# Patient Record
Sex: Male | Born: 1955 | Race: White | Hispanic: No | Marital: Married | Smoking: Former smoker
Health system: Southern US, Community
[De-identification: ages and names within clinical notes are randomized; demographics above are authoritative.]

## PROBLEM LIST (undated history)

## (undated) DIAGNOSIS — J45909 Unspecified asthma, uncomplicated: Secondary | ICD-10-CM

## (undated) DIAGNOSIS — K2210 Ulcer of esophagus without bleeding: Secondary | ICD-10-CM

## (undated) DIAGNOSIS — Z8719 Personal history of other diseases of the digestive system: Secondary | ICD-10-CM

## (undated) DIAGNOSIS — K219 Gastro-esophageal reflux disease without esophagitis: Secondary | ICD-10-CM

## (undated) DIAGNOSIS — G43909 Migraine, unspecified, not intractable, without status migrainosus: Secondary | ICD-10-CM

## (undated) DIAGNOSIS — J449 Chronic obstructive pulmonary disease, unspecified: Secondary | ICD-10-CM

## (undated) DIAGNOSIS — I82409 Acute embolism and thrombosis of unspecified deep veins of unspecified lower extremity: Secondary | ICD-10-CM

## (undated) DIAGNOSIS — N400 Enlarged prostate without lower urinary tract symptoms: Secondary | ICD-10-CM

## (undated) DIAGNOSIS — K22711 Barrett's esophagus with high grade dysplasia: Secondary | ICD-10-CM

## (undated) DIAGNOSIS — C8310 Mantle cell lymphoma, unspecified site: Secondary | ICD-10-CM

## (undated) DIAGNOSIS — J342 Deviated nasal septum: Secondary | ICD-10-CM

## (undated) HISTORY — PX: THROAT SURGERY: SHX803

## (undated) HISTORY — PX: NASAL SEPTUM SURGERY: SHX37

## (undated) HISTORY — PX: OTHER SURGICAL HISTORY: SHX169

## (undated) HISTORY — PX: TONSILLECTOMY: SUR1361

---

## 2003-03-16 ENCOUNTER — Encounter: Payer: Self-pay | Admitting: Internal Medicine

## 2003-03-16 ENCOUNTER — Ambulatory Visit (HOSPITAL_COMMUNITY): Admission: RE | Admit: 2003-03-16 | Discharge: 2003-03-16 | Payer: Self-pay | Admitting: Internal Medicine

## 2007-02-03 ENCOUNTER — Ambulatory Visit: Payer: Self-pay | Admitting: Nurse Practitioner

## 2007-02-03 ENCOUNTER — Ambulatory Visit: Payer: Self-pay | Admitting: *Deleted

## 2007-02-11 ENCOUNTER — Ambulatory Visit: Payer: Self-pay | Admitting: Family Medicine

## 2007-04-09 ENCOUNTER — Ambulatory Visit: Payer: Self-pay | Admitting: Internal Medicine

## 2007-04-09 ENCOUNTER — Encounter (INDEPENDENT_AMBULATORY_CARE_PROVIDER_SITE_OTHER): Payer: Self-pay | Admitting: Nurse Practitioner

## 2007-04-09 LAB — CONVERTED CEMR LAB
HDL: 44 mg/dL (ref >39)
LDL Cholesterol: 100 mg/dL — ABNORMAL HIGH (ref 0–99)
Triglycerides: 228 mg/dL — ABNORMAL HIGH (ref <150)

## 2007-05-18 DIAGNOSIS — J309 Allergic rhinitis, unspecified: Secondary | ICD-10-CM | POA: Insufficient documentation

## 2007-05-18 DIAGNOSIS — J45909 Unspecified asthma, uncomplicated: Secondary | ICD-10-CM | POA: Insufficient documentation

## 2007-05-18 DIAGNOSIS — J342 Deviated nasal septum: Secondary | ICD-10-CM

## 2014-02-08 LAB — COMPREHENSIVE METABOLIC PANEL
ALBUMIN: 4.1 g/dL (ref 3.4–5.0)
ALK PHOS: 99 U/L
AST: 27 U/L (ref 15–37)
Anion Gap: 9 (ref 7–16)
BILIRUBIN TOTAL: 0.5 mg/dL (ref 0.2–1.0)
BUN: 10 mg/dL (ref 7–18)
Calcium, Total: 9.1 mg/dL (ref 8.5–10.1)
Chloride: 104 mmol/L (ref 98–107)
Co2: 27 mmol/L (ref 21–32)
Creatinine: 0.81 mg/dL (ref 0.60–1.30)
EGFR (African American): >60
Glucose: 92 mg/dL (ref 65–99)
OSMOLALITY: 278 (ref 275–301)
POTASSIUM: 3.2 mmol/L — AB (ref 3.5–5.1)
SGPT (ALT): 25 U/L (ref 12–78)
SODIUM: 140 mmol/L (ref 136–145)
Total Protein: 7.1 g/dL (ref 6.4–8.2)

## 2014-02-08 LAB — CBC WITH DIFFERENTIAL/PLATELET
Bands: 7 %
Comment - H1-Com3: NORMAL
EOS PCT: 9 %
HCT: 41 % (ref 40.0–52.0)
HGB: 14.5 g/dL (ref 13.0–18.0)
LYMPHS PCT: 10 %
MCH: 33.1 pg (ref 26.0–34.0)
MCHC: 35.3 g/dL (ref 32.0–36.0)
MCV: 94 fL (ref 80–100)
METAMYELOCYTE: 1 %
Monocytes: 18 %
NRBC/100 WBC: 1 /
Platelet: 177 10*3/uL (ref 150–440)
RBC: 4.37 10*6/uL — AB (ref 4.40–5.90)
RDW: 14 % (ref 11.5–14.5)
Segmented Neutrophils: 54 %
Variant Lymphocyte - H1-Rlymph: 1 %
WBC: 8.4 10*3/uL (ref 3.8–10.6)

## 2014-02-08 LAB — URINALYSIS, COMPLETE
Bacteria: NONE SEEN
Bilirubin,UR: NEGATIVE
Blood: NEGATIVE
Glucose,UR: NEGATIVE mg/dL (ref 0–75)
Ketone: NEGATIVE
Leukocyte Esterase: NEGATIVE
Nitrite: NEGATIVE
Ph: 5 (ref 4.5–8.0)
Protein: 30
RBC,UR: 2 /HPF (ref 0–5)
SQUAMOUS EPITHELIAL: NONE SEEN
Specific Gravity: 1.036 (ref 1.003–1.030)
WBC UR: 12 /HPF (ref 0–5)

## 2014-02-08 LAB — D-DIMER(ARMC): D-DIMER: 1026 ng/mL

## 2014-02-08 LAB — LIPASE, BLOOD: LIPASE: 94 U/L (ref 73–393)

## 2014-02-08 LAB — MAGNESIUM: MAGNESIUM: 2 mg/dL

## 2014-02-09 LAB — POTASSIUM: Potassium: 3.4 mmol/L — ABNORMAL LOW (ref 3.5–5.1)

## 2014-02-10 ENCOUNTER — Inpatient Hospital Stay: Payer: Self-pay | Admitting: Internal Medicine

## 2014-02-11 ENCOUNTER — Ambulatory Visit (HOSPITAL_COMMUNITY)
Admission: AD | Admit: 2014-02-11 | Discharge: 2014-02-11 | Disposition: A | Discharge status: Home / Self Care | Payer: Medicaid Other | Source: Other Acute Inpatient Hospital | Attending: Internal Medicine | Admitting: Internal Medicine

## 2014-02-11 DIAGNOSIS — R112 Nausea with vomiting, unspecified: Secondary | ICD-10-CM | POA: Diagnosis present

## 2014-02-11 DIAGNOSIS — C8589 Other specified types of non-Hodgkin lymphoma, extranodal and solid organ sites: Secondary | ICD-10-CM | POA: Insufficient documentation

## 2014-04-29 LAB — CBC
HCT: 37.4 % — AB (ref 40.0–52.0)
HGB: 12.8 g/dL — ABNORMAL LOW (ref 13.0–18.0)
MCH: 31.3 pg (ref 26.0–34.0)
MCHC: 34.1 g/dL (ref 32.0–36.0)
MCV: 92 fL (ref 80–100)
PLATELETS: 320 10*3/uL (ref 150–440)
RBC: 4.08 10*6/uL — ABNORMAL LOW (ref 4.40–5.90)
RDW: 14 % (ref 11.5–14.5)
WBC: 5.3 10*3/uL (ref 3.8–10.6)

## 2014-04-29 LAB — BASIC METABOLIC PANEL
ANION GAP: 9 (ref 7–16)
BUN: 12 mg/dL (ref 7–18)
CHLORIDE: 108 mmol/L — AB (ref 98–107)
Calcium, Total: 8.1 mg/dL — ABNORMAL LOW (ref 8.5–10.1)
Co2: 24 mmol/L (ref 21–32)
Creatinine: 0.88 mg/dL (ref 0.60–1.30)
GLUCOSE: 77 mg/dL (ref 65–99)
Osmolality: 280 (ref 275–301)
Potassium: 3.2 mmol/L — ABNORMAL LOW (ref 3.5–5.1)
SODIUM: 141 mmol/L (ref 136–145)

## 2014-04-29 LAB — TROPONIN I: Troponin-I: <0.02 ng/mL

## 2014-04-30 ENCOUNTER — Inpatient Hospital Stay: Payer: Self-pay | Admitting: Internal Medicine

## 2014-05-01 LAB — CBC WITH DIFFERENTIAL/PLATELET
Basophil #: 0.1 10*3/uL (ref 0.0–0.1)
Basophil %: 1.5 %
EOS PCT: 8 %
Eosinophil #: 0.3 10*3/uL (ref 0.0–0.7)
HCT: 41.3 % (ref 40.0–52.0)
HGB: 14.2 g/dL (ref 13.0–18.0)
Lymphocyte #: 0.7 10*3/uL — ABNORMAL LOW (ref 1.0–3.6)
Lymphocyte %: 19.2 %
MCH: 31.2 pg (ref 26.0–34.0)
MCHC: 34.4 g/dL (ref 32.0–36.0)
MCV: 91 fL (ref 80–100)
MONO ABS: 0.8 x10 3/mm (ref 0.2–1.0)
MONOS PCT: 23 %
NEUTROS ABS: 1.7 10*3/uL (ref 1.4–6.5)
NEUTROS PCT: 48.3 %
Platelet: 299 10*3/uL (ref 150–440)
RBC: 4.56 10*6/uL (ref 4.40–5.90)
RDW: 14 % (ref 11.5–14.5)
WBC: 3.6 10*3/uL — ABNORMAL LOW (ref 3.8–10.6)

## 2014-05-01 LAB — MAGNESIUM: Magnesium: 2 mg/dL

## 2014-05-01 LAB — BASIC METABOLIC PANEL
ANION GAP: 5 — AB (ref 7–16)
BUN: 13 mg/dL (ref 7–18)
CHLORIDE: 103 mmol/L (ref 98–107)
CO2: 30 mmol/L (ref 21–32)
CREATININE: 0.94 mg/dL (ref 0.60–1.30)
Calcium, Total: 8.8 mg/dL (ref 8.5–10.1)
Glucose: 120 mg/dL — ABNORMAL HIGH (ref 65–99)
Osmolality: 277 (ref 275–301)
Potassium: 3.8 mmol/L (ref 3.5–5.1)
SODIUM: 138 mmol/L (ref 136–145)

## 2014-05-02 LAB — CBC WITH DIFFERENTIAL/PLATELET
Basophil #: 0 10*3/uL (ref 0.0–0.1)
Basophil %: 1 %
EOS ABS: 0.2 10*3/uL (ref 0.0–0.7)
Eosinophil %: 6.8 %
HCT: 38.9 % — ABNORMAL LOW (ref 40.0–52.0)
HGB: 13.6 g/dL (ref 13.0–18.0)
Lymphocyte #: 0.9 10*3/uL — ABNORMAL LOW (ref 1.0–3.6)
Lymphocyte %: 25.4 %
MCH: 31.3 pg (ref 26.0–34.0)
MCHC: 35 g/dL (ref 32.0–36.0)
MCV: 89 fL (ref 80–100)
MONO ABS: 0.9 x10 3/mm (ref 0.2–1.0)
MONOS PCT: 26 %
Neutrophil #: 1.4 10*3/uL (ref 1.4–6.5)
Neutrophil %: 40.8 %
PLATELETS: 271 10*3/uL (ref 150–440)
RBC: 4.35 10*6/uL — ABNORMAL LOW (ref 4.40–5.90)
RDW: 13.9 % (ref 11.5–14.5)
WBC: 3.5 10*3/uL — AB (ref 3.8–10.6)

## 2014-05-02 LAB — VANCOMYCIN, TROUGH: VANCOMYCIN, TROUGH: 14 ug/mL (ref 10–20)

## 2014-05-05 LAB — CULTURE, BLOOD (SINGLE)

## 2014-05-31 ENCOUNTER — Inpatient Hospital Stay: Payer: Self-pay | Admitting: Internal Medicine

## 2014-05-31 LAB — COMPREHENSIVE METABOLIC PANEL
ALBUMIN: 4.4 g/dL (ref 3.4–5.0)
Alkaline Phosphatase: 98 U/L
Anion Gap: 10 (ref 7–16)
BILIRUBIN TOTAL: 0.6 mg/dL (ref 0.2–1.0)
BUN: 11 mg/dL (ref 7–18)
CHLORIDE: 101 mmol/L (ref 98–107)
CO2: 29 mmol/L (ref 21–32)
Calcium, Total: 9 mg/dL (ref 8.5–10.1)
Creatinine: 0.84 mg/dL (ref 0.60–1.30)
EGFR (African American): >60
EGFR (Non-African Amer.): >60
Glucose: 80 mg/dL (ref 65–99)
OSMOLALITY: 278 (ref 275–301)
Potassium: 3.3 mmol/L — ABNORMAL LOW (ref 3.5–5.1)
SGOT(AST): 24 U/L (ref 15–37)
SGPT (ALT): 25 U/L
Sodium: 140 mmol/L (ref 136–145)
TOTAL PROTEIN: 7.6 g/dL (ref 6.4–8.2)

## 2014-05-31 LAB — URINALYSIS, COMPLETE
BILIRUBIN, UR: NEGATIVE
Bacteria: NONE SEEN
Blood: NEGATIVE
Glucose,UR: NEGATIVE mg/dL (ref 0–75)
Ketone: NEGATIVE
LEUKOCYTE ESTERASE: NEGATIVE
NITRITE: NEGATIVE
PH: 5 (ref 4.5–8.0)
Protein: NEGATIVE
RBC,UR: 1 /HPF (ref 0–5)
Specific Gravity: 1.016 (ref 1.003–1.030)
Squamous Epithelial: NONE SEEN
WBC UR: 2 /HPF (ref 0–5)

## 2014-05-31 LAB — CBC WITH DIFFERENTIAL/PLATELET
Basophil #: 0 10*3/uL (ref 0.0–0.1)
Basophil %: 0.5 %
EOS ABS: 0.1 10*3/uL (ref 0.0–0.7)
EOS PCT: 0.9 %
HCT: 44 % (ref 40.0–52.0)
HGB: 15 g/dL (ref 13.0–18.0)
LYMPHS ABS: 1.2 10*3/uL (ref 1.0–3.6)
Lymphocyte %: 13.3 %
MCH: 30.6 pg (ref 26.0–34.0)
MCHC: 34.1 g/dL (ref 32.0–36.0)
MCV: 90 fL (ref 80–100)
MONO ABS: 0.8 x10 3/mm (ref 0.2–1.0)
MONOS PCT: 9.2 %
Neutrophil #: 6.9 10*3/uL — ABNORMAL HIGH (ref 1.4–6.5)
Neutrophil %: 76.1 %
Platelet: 304 10*3/uL (ref 150–440)
RBC: 4.9 10*6/uL (ref 4.40–5.90)
RDW: 14.7 % — AB (ref 11.5–14.5)
WBC: 9 10*3/uL (ref 3.8–10.6)

## 2014-05-31 LAB — TROPONIN I: Troponin-I: <0.02 ng/mL

## 2014-06-01 LAB — CBC WITH DIFFERENTIAL/PLATELET
BASOS PCT: 0.7 %
Basophil #: 0 10*3/uL (ref 0.0–0.1)
EOS PCT: 3.8 %
Eosinophil #: 0.2 10*3/uL (ref 0.0–0.7)
HCT: 36.8 % — AB (ref 40.0–52.0)
HGB: 12.3 g/dL — AB (ref 13.0–18.0)
Lymphocyte #: 0.9 10*3/uL — ABNORMAL LOW (ref 1.0–3.6)
Lymphocyte %: 16.4 %
MCH: 30.3 pg (ref 26.0–34.0)
MCHC: 33.4 g/dL (ref 32.0–36.0)
MCV: 91 fL (ref 80–100)
MONO ABS: 0.6 x10 3/mm (ref 0.2–1.0)
Monocyte %: 10.7 %
NEUTROS PCT: 68.4 %
Neutrophil #: 3.6 10*3/uL (ref 1.4–6.5)
Platelet: 215 10*3/uL (ref 150–440)
RBC: 4.06 10*6/uL — AB (ref 4.40–5.90)
RDW: 14.6 % — AB (ref 11.5–14.5)
WBC: 5.3 10*3/uL (ref 3.8–10.6)

## 2014-06-01 LAB — BASIC METABOLIC PANEL
Anion Gap: 6 — ABNORMAL LOW (ref 7–16)
BUN: 9 mg/dL (ref 7–18)
CALCIUM: 7.9 mg/dL — AB (ref 8.5–10.1)
Chloride: 108 mmol/L — ABNORMAL HIGH (ref 98–107)
Co2: 30 mmol/L (ref 21–32)
Creatinine: 0.83 mg/dL (ref 0.60–1.30)
EGFR (African American): >60
EGFR (Non-African Amer.): >60
GLUCOSE: 83 mg/dL (ref 65–99)
OSMOLALITY: 285 (ref 275–301)
POTASSIUM: 3.6 mmol/L (ref 3.5–5.1)
Sodium: 144 mmol/L (ref 136–145)

## 2014-06-01 LAB — CLOSTRIDIUM DIFFICILE(ARMC)

## 2014-06-02 LAB — WBCS, STOOL

## 2014-06-03 LAB — STOOL CULTURE

## 2014-06-03 LAB — PATHOLOGY REPORT

## 2014-06-07 LAB — PATHOLOGY REPORT

## 2014-06-20 ENCOUNTER — Ambulatory Visit: Payer: Self-pay | Admitting: Gastroenterology

## 2014-07-11 ENCOUNTER — Emergency Department: Payer: Self-pay | Admitting: Emergency Medicine

## 2014-07-11 LAB — CBC WITH DIFFERENTIAL/PLATELET
BASOS ABS: 0 10*3/uL (ref 0.0–0.1)
BASOS PCT: 0.8 %
EOS PCT: 5.8 %
Eosinophil #: 0.2 10*3/uL (ref 0.0–0.7)
HCT: 38.7 % — ABNORMAL LOW (ref 40.0–52.0)
HGB: 13.2 g/dL (ref 13.0–18.0)
LYMPHS ABS: 0.5 10*3/uL — AB (ref 1.0–3.6)
Lymphocyte %: 16.8 %
MCH: 31.4 pg (ref 26.0–34.0)
MCHC: 34 g/dL (ref 32.0–36.0)
MCV: 93 fL (ref 80–100)
Monocyte #: 0.7 x10 3/mm (ref 0.2–1.0)
Monocyte %: 23.2 %
Neutrophil #: 1.6 10*3/uL (ref 1.4–6.5)
Neutrophil %: 53.4 %
Platelet: 187 10*3/uL (ref 150–440)
RBC: 4.19 10*6/uL — ABNORMAL LOW (ref 4.40–5.90)
RDW: 13.2 % (ref 11.5–14.5)
WBC: 3.1 10*3/uL — ABNORMAL LOW (ref 3.8–10.6)

## 2014-07-11 LAB — COMPREHENSIVE METABOLIC PANEL
ALBUMIN: 3.6 g/dL (ref 3.4–5.0)
ALK PHOS: 98 U/L
ALT: 34 U/L
ANION GAP: 6 — AB (ref 7–16)
BUN: 14 mg/dL (ref 7–18)
Bilirubin,Total: 0.6 mg/dL (ref 0.2–1.0)
CHLORIDE: 105 mmol/L (ref 98–107)
CO2: 31 mmol/L (ref 21–32)
Calcium, Total: 8.4 mg/dL — ABNORMAL LOW (ref 8.5–10.1)
Creatinine: 0.84 mg/dL (ref 0.60–1.30)
EGFR (Non-African Amer.): >60
GLUCOSE: 123 mg/dL — AB (ref 65–99)
Osmolality: 285 (ref 275–301)
Potassium: 3.6 mmol/L (ref 3.5–5.1)
SGOT(AST): 28 U/L (ref 15–37)
Sodium: 142 mmol/L (ref 136–145)
Total Protein: 6.8 g/dL (ref 6.4–8.2)

## 2014-07-21 ENCOUNTER — Ambulatory Visit: Payer: Self-pay | Admitting: Gastroenterology

## 2014-08-19 ENCOUNTER — Emergency Department: Payer: Self-pay | Admitting: Emergency Medicine

## 2014-08-19 LAB — CBC WITH DIFFERENTIAL/PLATELET
BASOS ABS: 0.1 10*3/uL (ref 0.0–0.1)
Basophil %: 0.7 %
Eosinophil #: 0.1 10*3/uL (ref 0.0–0.7)
Eosinophil %: 1.1 %
HCT: 43.6 % (ref 40.0–52.0)
HGB: 14.8 g/dL (ref 13.0–18.0)
LYMPHS ABS: 0.8 10*3/uL — AB (ref 1.0–3.6)
Lymphocyte %: 10.7 %
MCH: 31 pg (ref 26.0–34.0)
MCHC: 34 g/dL (ref 32.0–36.0)
MCV: 91 fL (ref 80–100)
MONO ABS: 0.7 x10 3/mm (ref 0.2–1.0)
MONOS PCT: 9.4 %
Neutrophil #: 5.8 10*3/uL (ref 1.4–6.5)
Neutrophil %: 78.1 %
PLATELETS: 216 10*3/uL (ref 150–440)
RBC: 4.78 10*6/uL (ref 4.40–5.90)
RDW: 13.8 % (ref 11.5–14.5)
WBC: 7.4 10*3/uL (ref 3.8–10.6)

## 2014-08-19 LAB — COMPREHENSIVE METABOLIC PANEL
ALBUMIN: 3.8 g/dL (ref 3.4–5.0)
ALK PHOS: 95 U/L
AST: 32 U/L (ref 15–37)
Anion Gap: 10 (ref 7–16)
BILIRUBIN TOTAL: 0.7 mg/dL (ref 0.2–1.0)
BUN: 12 mg/dL (ref 7–18)
Calcium, Total: 8.7 mg/dL (ref 8.5–10.1)
Chloride: 105 mmol/L (ref 98–107)
Co2: 25 mmol/L (ref 21–32)
Creatinine: 1.17 mg/dL (ref 0.60–1.30)
EGFR (African American): >60
EGFR (Non-African Amer.): >60
GLUCOSE: 122 mg/dL — AB (ref 65–99)
OSMOLALITY: 280 (ref 275–301)
POTASSIUM: 3.8 mmol/L (ref 3.5–5.1)
SGPT (ALT): 24 U/L
Sodium: 140 mmol/L (ref 136–145)
TOTAL PROTEIN: 7 g/dL (ref 6.4–8.2)

## 2014-08-19 LAB — LIPASE, BLOOD: LIPASE: 48 U/L — AB (ref 73–393)

## 2014-08-20 ENCOUNTER — Emergency Department: Payer: Self-pay | Admitting: Emergency Medicine

## 2014-08-20 LAB — COMPREHENSIVE METABOLIC PANEL
ANION GAP: 8 (ref 7–16)
Albumin: 4 g/dL (ref 3.4–5.0)
Alkaline Phosphatase: 101 U/L
BUN: 13 mg/dL (ref 7–18)
Bilirubin,Total: 0.8 mg/dL (ref 0.2–1.0)
CO2: 26 mmol/L (ref 21–32)
Calcium, Total: 8.9 mg/dL (ref 8.5–10.1)
Chloride: 105 mmol/L (ref 98–107)
Creatinine: 0.98 mg/dL (ref 0.60–1.30)
EGFR (African American): >60
Glucose: 99 mg/dL (ref 65–99)
OSMOLALITY: 278 (ref 275–301)
POTASSIUM: 3.7 mmol/L (ref 3.5–5.1)
SGOT(AST): 24 U/L (ref 15–37)
SGPT (ALT): 21 U/L
SODIUM: 139 mmol/L (ref 136–145)
Total Protein: 6.9 g/dL (ref 6.4–8.2)

## 2014-08-20 LAB — CBC
HCT: 43.2 % (ref 40.0–52.0)
HGB: 14.9 g/dL (ref 13.0–18.0)
MCH: 31.3 pg (ref 26.0–34.0)
MCHC: 34.5 g/dL (ref 32.0–36.0)
MCV: 91 fL (ref 80–100)
Platelet: 189 10*3/uL (ref 150–440)
RBC: 4.77 10*6/uL (ref 4.40–5.90)
RDW: 13.7 % (ref 11.5–14.5)
WBC: 7.2 10*3/uL (ref 3.8–10.6)

## 2014-08-20 LAB — URINALYSIS, COMPLETE
BILIRUBIN, UR: NEGATIVE
BLOOD: NEGATIVE
Bacteria: NONE SEEN
Glucose,UR: NEGATIVE mg/dL (ref 0–75)
Ketone: NEGATIVE
Leukocyte Esterase: NEGATIVE
Nitrite: NEGATIVE
PH: 5 (ref 4.5–8.0)
Protein: 30
RBC,UR: 3 /HPF (ref 0–5)
Specific Gravity: 1.02 (ref 1.003–1.030)
Squamous Epithelial: NONE SEEN
WBC UR: 9 /HPF (ref 0–5)

## 2014-08-20 LAB — LIPASE, BLOOD: LIPASE: 49 U/L — AB (ref 73–393)

## 2014-08-20 LAB — TROPONIN I: Troponin-I: <0.02 ng/mL

## 2014-08-29 LAB — URINALYSIS, COMPLETE
Bacteria: NONE SEEN
Bilirubin,UR: NEGATIVE
Blood: NEGATIVE
Glucose,UR: NEGATIVE mg/dL (ref 0–75)
Hyaline Cast: 5
Ketone: NEGATIVE
Leukocyte Esterase: NEGATIVE
NITRITE: NEGATIVE
Ph: 5 (ref 4.5–8.0)
Protein: NEGATIVE
RBC,UR: <1 /HPF (ref 0–5)
Specific Gravity: 1.02 (ref 1.003–1.030)
Squamous Epithelial: NONE SEEN
WBC UR: 1 /HPF (ref 0–5)

## 2014-08-29 LAB — TROPONIN I: Troponin-I: <0.02 ng/mL

## 2014-08-29 LAB — COMPREHENSIVE METABOLIC PANEL
ALK PHOS: 99 U/L
AST: 29 U/L (ref 15–37)
Albumin: 3.8 g/dL (ref 3.4–5.0)
Anion Gap: 6 — ABNORMAL LOW (ref 7–16)
BILIRUBIN TOTAL: 0.4 mg/dL (ref 0.2–1.0)
BUN: 10 mg/dL (ref 7–18)
CREATININE: 0.96 mg/dL (ref 0.60–1.30)
Calcium, Total: 8.7 mg/dL (ref 8.5–10.1)
Chloride: 106 mmol/L (ref 98–107)
Co2: 28 mmol/L (ref 21–32)
EGFR (African American): >60
EGFR (Non-African Amer.): >60
Glucose: 100 mg/dL — ABNORMAL HIGH (ref 65–99)
Osmolality: 279 (ref 275–301)
Potassium: 4.1 mmol/L (ref 3.5–5.1)
SGPT (ALT): 24 U/L
Sodium: 140 mmol/L (ref 136–145)
Total Protein: 6.8 g/dL (ref 6.4–8.2)

## 2014-08-29 LAB — CBC
HCT: 38.3 % — AB (ref 40.0–52.0)
HGB: 12.9 g/dL — ABNORMAL LOW (ref 13.0–18.0)
MCH: 31 pg (ref 26.0–34.0)
MCHC: 33.6 g/dL (ref 32.0–36.0)
MCV: 93 fL (ref 80–100)
PLATELETS: 164 10*3/uL (ref 150–440)
RBC: 4.14 10*6/uL — AB (ref 4.40–5.90)
RDW: 14.2 % (ref 11.5–14.5)
WBC: 4.1 10*3/uL (ref 3.8–10.6)

## 2014-08-30 ENCOUNTER — Observation Stay: Payer: Self-pay | Admitting: Internal Medicine

## 2014-09-29 ENCOUNTER — Ambulatory Visit: Payer: Self-pay | Admitting: Gastroenterology

## 2014-11-05 ENCOUNTER — Emergency Department: Payer: Self-pay | Admitting: Emergency Medicine

## 2014-11-26 ENCOUNTER — Emergency Department
Admit: 2014-11-26 | Disposition: A | Discharge status: Home / Self Care | Payer: Self-pay | Admitting: Emergency Medicine

## 2014-11-26 LAB — COMPREHENSIVE METABOLIC PANEL
ALBUMIN: 4.4 g/dL
AST: 21 U/L
Alkaline Phosphatase: 86 U/L
Anion Gap: 10 (ref 7–16)
BILIRUBIN TOTAL: 1 mg/dL
BUN: 17 mg/dL
CREATININE: 0.77 mg/dL
Calcium, Total: 8.7 mg/dL — ABNORMAL LOW
Chloride: 102 mmol/L
Co2: 26 mmol/L
EGFR (Non-African Amer.): >60
Glucose: 100 mg/dL — ABNORMAL HIGH
POTASSIUM: 3.4 mmol/L — AB
SGPT (ALT): 16 U/L — ABNORMAL LOW
SODIUM: 138 mmol/L
Total Protein: 7.3 g/dL

## 2014-11-26 LAB — CBC WITH DIFFERENTIAL/PLATELET
BASOS PCT: 0.7 %
Basophil #: 0 10*3/uL (ref 0.0–0.1)
EOS ABS: 0.2 10*3/uL (ref 0.0–0.7)
EOS PCT: 3.7 %
HCT: 44.2 % (ref 40.0–52.0)
HGB: 15.5 g/dL (ref 13.0–18.0)
Lymphocyte #: 1 10*3/uL (ref 1.0–3.6)
Lymphocyte %: 17.9 %
MCH: 31.6 pg (ref 26.0–34.0)
MCHC: 35.2 g/dL (ref 32.0–36.0)
MCV: 90 fL (ref 80–100)
Monocyte #: 0.6 x10 3/mm (ref 0.2–1.0)
Monocyte %: 10.9 %
Neutrophil #: 3.8 10*3/uL (ref 1.4–6.5)
Neutrophil %: 66.8 %
Platelet: 191 10*3/uL (ref 150–440)
RBC: 4.91 10*6/uL (ref 4.40–5.90)
RDW: 14.4 % (ref 11.5–14.5)
WBC: 5.8 10*3/uL (ref 3.8–10.6)

## 2014-11-26 LAB — URINALYSIS, COMPLETE
BLOOD: NEGATIVE
Bacteria: NONE SEEN
Bilirubin,UR: NEGATIVE
GLUCOSE, UR: NEGATIVE mg/dL (ref 0–75)
Ketone: NEGATIVE
LEUKOCYTE ESTERASE: NEGATIVE
NITRITE: NEGATIVE
PH: 6 (ref 4.5–8.0)
Protein: NEGATIVE
Specific Gravity: 1.029 (ref 1.003–1.030)
Squamous Epithelial: NONE SEEN

## 2014-11-26 LAB — D-DIMER(ARMC): D-Dimer: 436 ng/ml

## 2014-11-26 LAB — LIPASE, BLOOD: Lipase: 45 U/L

## 2014-11-26 LAB — TROPONIN I: Troponin-I: <0.03 ng/mL

## 2014-11-27 LAB — URINALYSIS, COMPLETE
BACTERIA: NONE SEEN
BILIRUBIN, UR: NEGATIVE
BLOOD: NEGATIVE
GLUCOSE, UR: NEGATIVE mg/dL (ref 0–75)
Ketone: NEGATIVE
Leukocyte Esterase: NEGATIVE
Nitrite: NEGATIVE
Ph: 7 (ref 4.5–8.0)
Protein: NEGATIVE
SQUAMOUS EPITHELIAL: NONE SEEN
Specific Gravity: 1.017 (ref 1.003–1.030)

## 2014-11-27 LAB — CBC WITH DIFFERENTIAL/PLATELET
BASOS PCT: 0.8 %
Basophil #: 0.1 10*3/uL (ref 0.0–0.1)
EOS PCT: 3.4 %
Eosinophil #: 0.2 10*3/uL (ref 0.0–0.7)
HCT: 43.6 % (ref 40.0–52.0)
HGB: 15 g/dL (ref 13.0–18.0)
Lymphocyte #: 1.3 10*3/uL (ref 1.0–3.6)
Lymphocyte %: 18.6 %
MCH: 31 pg (ref 26.0–34.0)
MCHC: 34.3 g/dL (ref 32.0–36.0)
MCV: 90 fL (ref 80–100)
MONO ABS: 0.6 x10 3/mm (ref 0.2–1.0)
Monocyte %: 9 %
Neutrophil #: 4.7 10*3/uL (ref 1.4–6.5)
Neutrophil %: 68.2 %
Platelet: 200 10*3/uL (ref 150–440)
RBC: 4.83 10*6/uL (ref 4.40–5.90)
RDW: 14.3 % (ref 11.5–14.5)
WBC: 6.9 10*3/uL (ref 3.8–10.6)

## 2014-11-27 LAB — COMPREHENSIVE METABOLIC PANEL
ALBUMIN: 4.2 g/dL
ALT: 15 U/L — AB
ANION GAP: 8 (ref 7–16)
AST: 19 U/L
Alkaline Phosphatase: 77 U/L
BUN: 13 mg/dL
Bilirubin,Total: 0.6 mg/dL
CHLORIDE: 105 mmol/L
Calcium, Total: 8.6 mg/dL — ABNORMAL LOW
Co2: 25 mmol/L
Creatinine: 0.63 mg/dL
GLUCOSE: 95 mg/dL
POTASSIUM: 3.7 mmol/L
Sodium: 138 mmol/L
TOTAL PROTEIN: 6.8 g/dL

## 2014-11-27 LAB — TROPONIN I

## 2014-11-27 LAB — LIPASE, BLOOD: Lipase: 24 U/L

## 2014-11-28 ENCOUNTER — Observation Stay
Admit: 2014-11-28 | Disposition: A | Discharge status: Home / Self Care | Payer: Self-pay | Attending: Internal Medicine | Admitting: Internal Medicine

## 2014-11-28 LAB — CBC WITH DIFFERENTIAL/PLATELET
Basophil #: 0 10*3/uL (ref 0.0–0.1)
Basophil %: 0.8 %
EOS ABS: 0.3 10*3/uL (ref 0.0–0.7)
Eosinophil %: 5.8 %
HCT: 37 % — ABNORMAL LOW (ref 40.0–52.0)
HGB: 12.8 g/dL — AB (ref 13.0–18.0)
LYMPHS PCT: 19.8 %
Lymphocyte #: 0.9 10*3/uL — ABNORMAL LOW (ref 1.0–3.6)
MCH: 31 pg (ref 26.0–34.0)
MCHC: 34.5 g/dL (ref 32.0–36.0)
MCV: 90 fL (ref 80–100)
Monocyte #: 0.5 x10 3/mm (ref 0.2–1.0)
Monocyte %: 10.6 %
NEUTROS ABS: 2.7 10*3/uL (ref 1.4–6.5)
Neutrophil %: 63 %
PLATELETS: 166 10*3/uL (ref 150–440)
RBC: 4.12 10*6/uL — AB (ref 4.40–5.90)
RDW: 14.1 % (ref 11.5–14.5)
WBC: 4.4 10*3/uL (ref 3.8–10.6)

## 2014-11-28 LAB — BASIC METABOLIC PANEL
Anion Gap: 7 (ref 7–16)
BUN: 11 mg/dL
CHLORIDE: 107 mmol/L
CO2: 27 mmol/L
Calcium, Total: 8.3 mg/dL — ABNORMAL LOW
Creatinine: 0.67 mg/dL
EGFR (African American): >60
EGFR (Non-African Amer.): >60
Glucose: 92 mg/dL
Potassium: 3.2 mmol/L — ABNORMAL LOW
Sodium: 141 mmol/L

## 2014-12-01 ENCOUNTER — Ambulatory Visit
Admit: 2014-12-01 | Disposition: A | Discharge status: Home / Self Care | Payer: Self-pay | Attending: Gastroenterology | Admitting: Gastroenterology

## 2014-12-10 NOTE — Discharge Summary (Signed)
Dates of Admission and Diagnosis:  Date of Admission 08-Feb-2014   Date of Discharge 11-Feb-2014   Admitting Diagnosis Intractable nausea and vomit.   Final Diagnosis Intractable nausea and vomiting- with abdominal pain- likely Gastroenteritis- need EGD- planned per Gi. 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 This is his fifth round of chemotherapy, which he has receives through the [place] in [place], last treatment approximately one week ago. He now has one day duration of nausea, vomiting and diarrhea, nonbloody, nonbilious emesis, profuse watery diarrhea, multiple bouts a day, with associated fatigue, weakness, and mild abdominal pain, which he describes as cramping, in a periumbilical location, 3 to 4 out of 10 in intensity. No worsening or relieving factors. No association with oral intake. Denies any fevers or chills; however, is positive for multiple family members with past sick contacts with similar illnesses. Currently, no complaints at this time.   Allergies:  Bee Stings: Swelling    Hepatic:  23-Jun-15 15:25   Bilirubin, Total 0.5  Alkaline Phosphatase 99 (45-117 NOTE: New Reference Range 07/09/13)  SGPT (ALT) 25  SGOT (AST) 27  Total Protein, Serum 7.1  Albumin, Serum 4.1  Routine Chem:  23-Jun-15 15:25   Potassium, Serum  3.2  Magnesium, Serum 2.0 (1.8-2.4 THERAPEUTIC RANGE: 4-7 mg/dL TOXIC: > 10 mg/dL  -----------------------)  Lipase 94 (Result(s) reported on 08 Feb 2014 at 04:14PM.)  Glucose, Serum 92  BUN 10  Creatinine (comp) 0.81  Sodium, Serum 140  Chloride, Serum 104  CO2, Serum 27  Calcium (Total), Serum 9.1  Osmolality (calc) 278  eGFR (African American) >60  eGFR (Non-African American) >60 (eGFR values <46mL/min/1.73 m2 may be an indication of chronic kidney  disease (CKD). Calculated eGFR is useful in patients with stable renal function. The eGFR calculation will not be reliable in acutely ill patients when serum creatinine is changing rapidly. It is not useful in  patients on dialysis. The eGFR calculation may not be applicable to patients at the low and high extremes of body sizes, pregnant women, and vegetarians.)  Anion Gap 9  24-Jun-15 09:15   Potassium, Serum  3.4 (Result(s) reported on 09 Feb 2014 at 09:46AM.)  Routine UA:  23-Jun-15 15:28   Color (UA) Amber  Clarity (UA) Hazy  Glucose (UA) Negative  Bilirubin (UA) Negative  Ketones (UA) Negative  Specific Gravity (UA) 1.036  Blood (UA) Negative  pH (UA) 5.0  Protein (UA) 30 mg/dL  Nitrite (UA) Negative  Leukocyte Esterase (UA) Negative (Result(s) reported on 08 Feb 2014 at 04:53PM.)  RBC (UA) 2 /HPF  WBC (UA) 12 /HPF  Bacteria (UA) NONE SEEN  Epithelial Cells (UA) NONE SEEN  Mucous (UA) PRESENT  Cellular Cast (UA) 34 /LPF (Result(s) reported on 08 Feb 2014 at 04:53PM.)  Routine Coag:  23-Jun-15 15:25   D-Dimer, Quantitative 1026 (INTERPRETATION <> Exclusion of Venous Thromboembolism (VTE) - OUTPATIENT ONLY       (Emergency Department or [place])             0-499 ng/ml (FEU)  : With a low to intermediate pretest                                  probability for VTE this test result  excludes the diagnosis of VTE.             > 499 ng/ml (FEU)  : VTE not excluded; additional work up                                  for VTE is required. <> Testing on Inpatients and Evaluation of Disseminated Intravascular        Coagulation (DIC)             Reference Range:  0-499 ng/ml (FEU))  Routine Hem:  23-Jun-15 15:25   WBC (CBC) 8.4  RBC (CBC)  4.37  Hemoglobin (CBC) 14.5  Hematocrit (CBC) 41.0  Platelet Count (CBC) 177 (Result(s) reported on 08 Feb 2014 at 04:55PM.)  MCV 94  MCH 33.1  MCHC 35.3  RDW 14.0  Bands 7  Segmented Neutrophils 54   Lymphocytes 10  Variant Lymphocytes 1  Monocytes 18  Eosinophil 9  Metamyelocyte 1  NRBC 1  Diff Comment 1 ANISOCYTOSIS  Diff Comment 2 POLYCHROMASIA  Diff Comment 3 NORMAL PLT MORPHOLGY  Result(s) reported on 08 Feb 2014 at 04:55PM.   PERTINENT RADIOLOGY STUDIES: XRay:    23-Jun-15 16:11, Chest PA and Lateral  Chest PA and Lateral   REASON FOR EXAM:    lymphoma getting chemo, sob x 1 month  COMMENTS:       PROCEDURE: DXR - DXR CHEST PA (OR AP) AND LATERAL  - Feb 08 2014  4:11PM     CLINICAL DATA:  59 year old male with cough nausea and vomiting.  Stage IV lymphoma undergoing chemotherapy. Initial encounter.    EXAM:  CHEST  2 VIEW    COMPARISON:  None.    FINDINGS:  Lung volumes are within normal limits. Most *SCRATCH* back cardiac  and most mediastinal contours are within normal limits. There is  subtle increased right peritracheal density, and increased left  peritracheal/prevascular density.    Scattered right lung small calcified granulomas. No pneumothorax,  pulmonary edema, pleural effusion or confluent pulmonary opacity. No  acute osseous abnormality identified.     IMPRESSION:  1. No acute pulmonary abnormality.  2. Increased peritracheal density may reflect superior  mediastinum/thoracic inlet lymphoma in this setting.      Electronically Signed    By: Lars Pinks M.D.    On: 02/08/2014 16:22         Verified By: Gwenyth Bender. [place] Crane, M.D.,  [place]:    23-Jun-15 21:15, US Abdomen Limited Survey  US Abdomen Limited Survey   REASON FOR EXAM:    vomiting, ruq abd pain, tender, GB distended on CT.  COMMENTS:   May transport without cardiac monitor    PROCEDURE: [place]  - US ABDOMEN LIMITED SURVEY  - Feb 08 2014  9:15PM     CLINICAL DATA:  Vomiting. Right upper quadrant abdominal pain.  Tenderness. Gallbladder distention on CT. Mantle cell lymphoma.    EXAM:  US ABDOMEN LIMITED - RIGHT UPPER QUADRANT    COMPARISON:  02/08/2014  CT    FINDINGS:  Gallbladder:  No gallstones or wall thickening visualized. No sonographic Murphy  sign noted. The gallbladder does appear slightly  prominent/distended.    Common bile duct:    Diameter: 7 mm, mildly prominent, without directly visualized  choledocholithiasis.    Liver:    No focal lesion identified. Within normal limits in parenchymal  echogenicity.     IMPRESSION:  1. Mild gallbladder  hydrops, with minimal dilatation of the common  bile duct. We do not visualize a CBD stone. Causes for gallbladder  hydrops include fasting, viral infections, or biliary obstruction.  The lack of associated findings of cholecystitis argues against  acute cholecystitis. If the patient has true biliary colic or if the  bilirubin level is elevated, nuclear medicine hepatobiliary scan  could be utilized to assess for patency of the cystic and common  bile duct.      Electronically Signed    By: Sherryl Barters M.D.    On: 02/08/2014 21:27       Verified By: Carron Curie, M.D.,  [place]:    23-Jun-15 16:11, Chest PA and Lateral  PACS Image     23-Jun-15 17:21, CT [facility] Chest with for PE  PACS Image     23-Jun-15 19:30, CT Abdomen and Pelvis Without Contrast  PACS Image     23-Jun-15 21:15, US Abdomen Limited Survey  PACS Image     25-Jun-15 13:58, Hepatobiliary W/GB Ejec Fraction -NM  PACS Image   CT:    23-Jun-15 19:30, CT Abdomen and Pelvis Without Contrast  CT Abdomen and Pelvis Without Contrast   REASON FOR EXAM:    (1) diffuse abd pain, n/v. chemo 1 week ago. prior   CTA chest performed today; (2  COMMENTS:       PROCEDURE: CT  - CT ABDOMEN AND PELVIS W0  - Feb 08 2014  7:30PM     CLINICAL DATA:  Nausea, vomiting, diarrhea, and back pain. Shortness  of breath. Body aches. Mantle cell lymphoma with last chemotherapy  treatment last week. Abdominal pain.    EXAM:  CT ABDOMEN AND PELVIS WITHOUT CONTRAST    TECHNIQUE:  Multidetector CT  imaging of the abdomen and pelvis was performed  following the standard protocol without IV contrast.    COMPARISON:  02/08/2014    FINDINGS:  Mild splenomegaly, splenic volume 570 cc.  Small hiatal hernia.    The liver, pancreas, and right adrenal gland appear normal. 1.7 x  1.3 cm left adrenal mass, internal density 10 Hounsfield units,  favoring adenoma.    Mildly distended gallbladder, diameter 4.7 cm. Common bile duct  caliber 8 mm, mildly dilated. No directly visualized biliary stones.    There is some residual contrast medium in the renal collecting  systems, as expected. No specific renal abnormality. There is a  peripancreatic/porta hepatis node measuring 2.2 cm in short axis on  image 28 of series 2, abnormally prominent. A portacaval node  measures 1.8 cm in short axis on image 33 of series 2, likewise  prominent.    Appendix normal.  No pathologic pelvic adenopathy is observed.    Proximal sigmoid diverticulosis. There is wall thickening in the  vicinity of greatest concentration of diverticula.    Fluid filled distal large bowel and rectum.    Contrast filled urinary bladder, without filling defect observed.  Bridging spurring, right sacroiliac joint.    Small umbilical hernia contains adipose tissue.     IMPRESSION:  1. Adenopathy in the porta hepatis with mild splenomegaly. These  findings are probably a manifestation of the patient's lymphoma.  2. Proximal sigmoid diverticulosis. Wall thickening in this vicinity  is probably due to the diverticulosis itself ; sigmoid colon mass  fortuitously positioned at the site of diverticulosis is considered  less likely, but correlate with the patient's history of colon  cancer screening.  3. Fluid in the distal colon indicating diarrheal process.  4. Somewhat distended gallbladder and mildly dilated common bile  duct, etiology uncertain. Sonography or nuclear medicine  hepatobiliary scan could be utilized for further  characterization,  if clinically warranted.  5. Small hiatal hernia.      Electronically Signed    By: Sherryl Barters M.D.    On: 02/08/2014 19:51         Verified By: Carron Curie, M.D.,  Nuclear Med:    25-Jun-15 13:58, Hepatobiliary W/GB Ejec Fraction -NM  Hepatobiliary W/GB Ejec Fraction -NM   REASON FOR EXAM:    abd pain with food  COMMENTS:       PROCEDURE: NM  - NM HEPATO WITH GB EJECT FRACTION  - Feb 10 2014  1:58PM     CLINICAL DATA:  Abdominal pain    EXAM:  NUCLEAR MEDICINE HEPATOBILIARY IMAGING WITH GALLBLADDER EF    TECHNIQUE:  Sequential images of the abdomen were obtained out to 60 minutes  following intravenous administration of radiopharmaceutical. After  slow intravenous infusion of 1.87 micrograms Cholecystokinin,  gallbladder ejection fraction was determined.  RADIOPHARMACEUTICALS:  8.6 [employer] VO-35K Choletec    COMPARISON:  Ultrasound 02/08/2014    FINDINGS:  There is normal uptake of the tracer by the liver. Gallbladder and  CBD visualized at 20 min. Bowel activity at 30 min. Post CCK  gallbladder ejection fraction is 69%.. At 30 min, normal ejection  fraction is greater than 30%.    The patient did experience stomach cramping during CCK infusion,  which subsided.     IMPRESSION:  1. No cystic duct obstruction. Post CCK gallbladder ejection  fraction is 69%.      Electronically Signed    By: Lahoma Crocker M.D.    On: 02/10/2014 14:20         Verified By: Ephraim Hamburger, M.D.,   Pertinent Past History:  Pertinent Past History Mantle cell lymphoma on active chemotherapy, last treated about one week ago.   Hospital Course:  Hospital Course A 59 year old gentleman with history of mantle cell lymphoma on active chemotherapy, presenting with nausea, vomiting, diarrhea, with positive sick contacts.  1.  Intractable nausea and vomiting: continue supportive care, intravenous fluid hydration as well as Zofran as needed. negative H-B  scan . CT and sonogram did not show clear source for this except- some LN pathy.   Appreciated GI consult. may need EGD- scheduled fro monday 02/14/14- started and slow upgrade on diet.   For pain control- On 15 mg morphine sulfate long acting- BID, also required 16 mg total morphin in in last 24 hrs.   So will inclrease morphin Long acting 30 mg bid.  2.  Hypokalemia: replete and monitor  3.  Mantle cell lymphoma : continue his pain medications per his pain regimen at home.     on chemo at [place].  4.  Deep venous thrombosis prophylaxis with heparin subcutaneous.   Condition on Discharge Stable   Code Status:  Code Status Full Code   DISCHARGE INSTRUCTIONS HOME MEDS:  Medication Reconciliation: Patient's Home Medications at Discharge:     Medication Instructions  mirtazapine 15 mg oral tablet  1 tab(s) orally once a day (at bedtime)   ondansetron 8 mg oral tablet  1 tab(s) orally every 8 hours   morphine 30 mg/12 hr oral tablet, extended release  1 tab(s) orally every 12 hours   fluticasone nasal  1 spray(s)  2 times a day, As needed, congestion   pantoprazole 40 mg oral delayed  release tablet  1 tab(s) orally 2 times a day    STOP TAKING THE FOLLOWING MEDICATION(S):    filgrastim 480 mcg/1.6 ml injectable solution: 480 microgram(s) injectable once a day verapamil 180 mg/24 hours oral capsule, extended release: 1 cap(s) orally once a day docusate sodium 100 mg oral capsule: 200 milligram(s) orally once a day meclizine 25 mg oral tablet, chewable: 1 tab(s) orally 2 times a day, As Needed ranitidine 150 mg oral tablet: 1 tab(s) orally 2 times a day omeprazole 20 mg oral delayed release capsule: 2 tab(s) orally once a day oxycodone 5 mg oral tablet: 1-2 tabs orally every 4 hours prn  Physician's Instructions:  Diet Regular   Activity Limitations As tolerated   Return to Work Not Applicable   Time frame for Follow Up Appointment 1-2 days   Other Comments Transferred to [place].    Electronic Signatures: Peter Garter (RN)  (Signed 26-Jun-15 17:24)  Authored: Ravenden Vaughan Basta (MD)  (Signed 26-Jun-15 16:59)  Authored: ADMISSION DATE AND DIAGNOSIS, CHIEF COMPLAINT/HPI, Allergies, PERTINENT LABS, PERTINENT RADIOLOGY STUDIES, PERTINENT PAST HISTORY, HOSPITAL COURSE, DISCHARGE INSTRUCTIONS HOME MEDS, PATIENT INSTRUCTIONS   Last Updated: 26-Jun-15 17:24 by Peter Garter (RN)

## 2014-12-10 NOTE — Consult Note (Signed)
Chief Complaint:  Subjective/Chief Complaint Please see fullGI consult and brief consult note for complete recs.  Paitne admitted with n/v and lower abdominal pain  after eating a restaurant meal. apparently several family members were ill, but recovered quickly.  Patient currently feeling better than yesterday, however has been having chronic n/v and weight loss for several months.  Abdominalpain is lower, not upper for the most part, but has been taking advil on a daily basis.  No previous h/o PUDz, no previous colonoscopy.  Currently undergoing chemotherapy for mantle cell lymphoma.  Not neutrapenic, essentially normal labs with exception of sl low K+.  Patient also with chronic diarrhea.  Reccommend continue current supportive treatment of enteritis, will change diet to clears for now.  In light of the h/o nsaid use is it likely he has some amount of gastritis or other irritation that impacted on the current illness.  Recommend EGD when clinically feasible.  Should have a colonoscopy at some point, but that could be done at [place] where he usually receives his care.   VITAL SIGNS/ANCILLARY NOTES: **Vital Signs.:   25-Jun-15 15:55  Vital Signs Type Routine  Temperature Temperature (F) 98  Celsius 36.6  Pulse Pulse 73  Respirations Respirations 20  Systolic BP Systolic BP 106  Diastolic BP (mmHg) Diastolic BP (mmHg) 78  Mean BP 95  Pulse Ox % Pulse Ox % 94  Pulse Ox Activity Level  At rest  Oxygen Delivery Room Air/ 21 %   Brief Assessment:  Cardiac Regular   Respiratory clear BS   Gastrointestinal details normal Soft  Nondistended  Bowel sounds normal  No rebound tenderness  mild lower abdominal discomfort and epigastric, but mostly llq.   Electronic Signatures: Loistine Simas (MD)  (Signed 25-Jun-15 21:57)  Authored: Chief Complaint, VITAL SIGNS/ANCILLARY NOTES, Brief Assessment   Last Updated: 25-Jun-15 21:57 by Loistine Simas (MD)

## 2014-12-10 NOTE — H&P (Signed)
PATIENT NAME:  Alexander [name], Alexander [name] MR#:  [id number] DATE OF BIRTH:  05/10/1956  DATE OF ADMISSION:  04/30/2014  REFERRING PHYSICIAN:  Hinda Kehr, MD   PRIMARY CARE PHYSICIAN:  [facility] The patient does not want to be transferred to the [facility] wants to be admitted to [facility] today.   CHIEF COMPLAINT: Cough, shortness of breath.   HISTORY OF PRESENT ILLNESS: This is a 59 year old male with known history of mantle cell lymphoma on chemotherapy, last session was yesterday.  The patient presents with complaints of cough, shortness of breath.  When presented, the patient was afebrile.  He had CT chest with contrast to rule out PE and it was negative for PE, but it did show evidence of pneumonia. Given the fact that the patient was immunocompromised, given his recent chemotherapy, the patient was started on broad-spectrum IV antibiotics and hospitalist requested to admit the patient for treatment of pneumonia.  The patient was afebrile, did not have any leukocytosis.  He denies any fever or chills at home, denies any chest pain, any nausea, vomiting, diarrhea.   REVIEW OF SYSTEMS:  GENERAL:  Reports fatigue, weakness, chills, shortness of breath, cough.  EYES: Denies blurry vision, double vision, inflammation.  ENT: Denies tinnitus, ear pain, hearing loss.  RESPIRATORY: Reports cough, shortness of breath, productive sputum.  CARDIOVASCULAR: Denies any chest pain, palpitation, edema, syncope.  GASTROINTESTINAL: Denies nausea, vomiting, diarrhea, abdominal pain, hematemesis.  GENITOURINARY: Denies dysuria, hematuria, renal colic.  ENDOCRINE: Denies polyuria, polydipsia, heat or cold intolerance.  HEMATOLOGY: Denies easy bruising or bleeding.  SKIN: Denies any skin rash or lesions or itching.  MUSCULOSKELETAL: Denies any neck pain, back pain, shoulder pain, gout or cramps.  NEUROLOGIC: Denies CVA, TIA, focal deficits, tingling, numbness, deficits.  PSYCHIATRIC: Denies anxiety or  depression.   PAST MEDICAL HISTORY:  1.  Mantle cell lymphoma on active chemotherapy.  2.  Hypertension.   SOCIAL HISTORY: Denies alcohol, tobacco or drug use.   FAMILY HISTORY: Significant for multiple cancers, but no mantle cell.   ALLERGIES: BEE STINGS.   HOME MEDICATIONS:  1.  Morphine 30 mg, 12 hour release, once every 12 hours. 2.  Oxycodone 5 mg 2 tablets every 4 hours as needed.  3.  Verapamil 120 mg oral daily. 4.  Meclizine as needed. 5.  Zofran as needed. 6.  Haloperidol 0.5 mg 4 times a day as needed.  7.  Albuterol as needed.  8.  Senna 3 times a day.  9.  Docusate sodium 100 mg oral 2 times a day.  10. Ranitidine 150 mg oral 2 times a day.  11. Fluticasone nasal, 1 spray 2 times daily.  12. Omeprazole 20 mg oral daily.   PHYSICAL EXAMINATION:  GENERAL:  Well-nourished male who looks comfortable in bed, in no apparent distress.  HEENT: Head atraumatic, normocephalic. Pupils equal, reactive to light. Pink conjunctivae. Anicteric sclerae. Moist oral mucosa. No nasal discharge. No oral thrush or pharyngeal erythema.  NECK: Supple. No thyromegaly. No JVD. No carotid bruits.  Trachea is midline. No neck rigidity.  CHEST: Good air entry bilaterally.  No wheezing, rales or rhonchi.  No use of accessory muscle.  CARDIOVASCULAR: S1, S2 heard.  No rubs, murmur or gallops.  PMI nondisplaced.  ABDOMEN: Soft, nontender, nondistended. Bowel sounds present. No rebound, no guarding.  EXTREMITIES: No edema. No clubbing. No cyanosis. Pedal pulses +2 bilaterally.  PSYCHIATRIC: Appropriate affect. Awake, alert x 3. Intact judgment and insight.  NEUROLOGIC: Cranial nerves grossly intact. Motor  5/5.  No focal deficits.  Sensation is symmetrical and intact to touch bilaterally.  MUSCULOSKELETAL: No joint effusion or erythema.  SKIN: Normal skin turgor. Warm and dry.   PERTINENT LABORATORY DATA: Glucose 77, BUN 12, creatinine 0.88, sodium 141, potassium 3.2, .  CO2 of 24.  Troponin less  than 0.02.  White blood cell 5.3, hemoglobin 12.8, hematocrit 37.4, platelets 320,000.    IMAGING: CT chest with IV contrast showing no evidence of pulmonary embolism, patchy opacities within the lung, compatible with an acute infectious process, possibly atypical in nature.   ASSESSMENT AND PLAN:  1.  Pneumonia, given the fact that the patient is immunocompromised, as he is on chemotherapy secondary to mantle cell lymphoma, will be started on IV vancomycin and cefepime and levofloxacin.  We will continue him on nebs as needed and we will check daily blood count to be sure he does not have neutropenia.  2.  Hypertension. Blood pressure acceptable. Continue with home medication.  3.  Gastroesophageal reflux disease.  Continue with proton pump inhibitor.  4.  Mantle cell lymphoma.  The patient is following as an outpatient with [facility].  5.  Hypokalemia.  We will replace.  6.  Deep vein thrombosis prophylaxis. Subcutaneous heparin.   CODE STATUS: FULL CODE.   TOTAL TIME SPENT ON ADMISSION AND PATIENT CARE: 55 minutes.     ____________________________ Albertine Patricia, MD dse:DT D: 04/30/2014 07:42:33 ET T: 04/30/2014 08:04:17 ET JOB#: [id number]  cc: Albertine Patricia, MD, <Dictator> Deajah Erkkila Graciela Husbands MD ELECTRONICALLY SIGNED 05/01/2014 0:57

## 2014-12-10 NOTE — H&P (Signed)
PATIENT NAME:  Alexander [name], Alexander [name] MR#:  [id number] DATE OF BIRTH:  April 19, 1956  PRIMARY CARE PHYSICIAN:  Perrin Maltese, MD  [facility]: [facility] [place]: Vomiting, diarrhea, left lower quadrant pain.   HISTORY OF PRESENTING ILLNESS: A 59 year old Caucasian male patient with history of mantle cell lymphoma and recently treated with antibiotics for pneumonia, presents to the Emergency Room complaining of 6 days of vomiting. The patient finished his chemotherapy for his lymphoma 1 month prior. Has had on-and-off some nausea, vomiting, but no long stretches, but over the last 6 days, he is unable to keep any food or drink down. Today, he has had about 5 episodes of watery diarrhea with left lower quadrant pain and is presenting to the Emergency Room. Here, a CT scan was done which shows descending colitis, also the splenic flexure along with some esophagitis on the CT, and is being admitted to the hospitalist service.   The patient is afebrile, normal white count, but has intractable nausea, vomiting, and also diarrhea at this point.   He does not complain of any shortness of breath, hematemesis, hematochezia.   He has done well since being discharged recently for pneumonia and this admission is for a different cause.   PAST MEDICAL HISTORY: 1.  Mantle cell lymphoma on active chemotherapy.  2.  Hypertension mentioned in old records, but the patient denies this.   SOCIAL HISTORY: Does not smoke. No alcohol. No illicit drug use. Lives at home with his wife. Ambulates on his own.   CODE STATUS: Full code.   FAMILY HISTORY: Multiple cancers in his family members.   ALLERGIES: BEE STING.   REVIEW OF SYSTEMS: CONSTITUTIONAL: Complains of fatigue and weakness.  EYES: No blurred vision, pain or redness.  ENT: No tinnitus, ear pain, or hearing loss.  RESPIRATORY: No cough, wheeze, hemoptysis.  CARDIOVASCULAR: No chest pain, orthopnea, edema.  GASTROINTESTINAL: Has nausea, vomiting,  diarrhea, and abdominal pain.  GENITOURINARY: No dysuria, hematuria, or frequency. Has decreased urine output.  ENDOCRINE: No polyuria, nocturia, thyroid problems.  HEMATOLOGIC AND LYMPHATIC: Has mantle cell lymphoma.  INTEGUMENTARY: No acne, rash, lesion.  MUSCULOSKELETAL: No back pain, arthritis.  NEUROLOGIC: No focal numbness, weakness.  PSYCHIATRIC: No anxiety or depression.   HOME MEDICATIONS: 1.  Albuterol 2 puffs inhaled 2 times a day.  2.  Docusate sodium 100 mg 2 times a day as needed for constipation.  3.  Flonase 2 times a day as needed for congestion.  4.  Haloperidol 0.5 oral 4 times a day as needed for anxiety and nervousness.  5.  Hydrochlorothiazide 12.5 mg oral once a day.  6.  Meclizine 25 mg 3 times a day as needed for dizziness.  7.  Morphine 20 mg capsule in the morning and 40 mg capsule at night.  8.  Omeprazole 20 mg 2 tablets oral once a day.  9.  Zofran 8 mg oral every 8 hours.  10.  Oxycodone 5 mg oral 2 tablets every 4 hours as needed for pain.  11.  Prochlorperazine 5 mg 2 tablets oral once a day.  12.  _____ 150 mg oral 2 times a day.  13.  Senna 8.6 mg 2 tablets oral 3 times a day.  14.  Tamsulosin 0.4 mg oral once a day.  15.  Toprol-XL 50 mg oral once a day.   PHYSICAL EXAMINATION: VITAL SIGNS: Shows temperature 98.1, pulse of 70, blood pressure 123/81, saturating 97% on room air.  GENERAL: Obese, Caucasian male patient  lying in bed with mild distress secondary to his abdominal pain.  PSYCHIATRIC: He is alert and oriented x 3. Mood and affect appropriate. Judgment intact.   HEENT: Normocephalic, atraumatic. Oral mucosa dry and pink. External ears and nose normal. No pallor. No icterus. Pupils bilaterally equal and reactive to light.  NECK: Supple. No thyromegaly. No palpable lymph nodes. Trachea midline. No carotid bruit, JVD.  CARDIOVASCULAR: S1, S2, without any murmurs. Peripheral pulses 2+. No edema.  RESPIRATORY: Normal work of breathing. Clear to  auscultation on both sides.  GASTROINTESTINAL: Soft abdomen, nontender. Bowel sounds present. No hepatosplenomegaly palpable.  SKIN: Warm and dry. No petechiae, rash, ulcers.  MUSCULOSKELETAL: No joint swelling, redness in large joints. Normal muscle tone.  NEUROLOGIC: Motor strength 5/5 in upper extremities.  Sensory function intact all over.  LYMPHATIC: No cervical lymphadenopathy.  LABORATORY STUDIES: Show glucose of 80, BUN 11, creatinine 0.84. Sodium 140, potassium 3.3, with chloride 101. AST, ALT,  alkaline phosphatase, bilirubin normal. Troponin less than 0.02. WBC 9, hemoglobin 15, platelets of 304,000, neutrophils 76%.   Urinalysis shows no bacteria and no WBC.   IMAGING:  CT scan of the abdomen and pelvis with contrast shows splenic flexure and descending area colitis, although this could be attributed to his decompressed colon. Borderline splenomegaly.  Scattered diverticula without diverticulitis. Also distal esophageal wall thickening with esophagitis. Left lower lobe bud-in-tree appearance where he had recent pneumonia.   ASSESSMENT AND PLAN: 1.  Acute splenic flexure and descending area of colitis. The patient had similar findings on old CT.  It is unclear if this is a real acute colitis or just from an empty bowel wall at this point. He is afebrile, normal white count, but has used recent antibiotics. Will check a Clostridium difficile test at this point. Send for stool cultures and stool WBC. Also start him on Cipro and Flagyl. He does have tenderness in the left lower quadrant area where his colitis findings are seen on the CT. Consult gastroenterology for further input with the case and also regarding input for esophagitis.   2.  Vomiting. The patient seems to have chronic vomiting with his esophagitis, gastroesophageal reflux disease, also chemotherapy. Takes Zofran regularly. We will put him on IV Zofran here in the hospital, but could also be an exacerbation from his colitis.   3.  Mantle cell lymphoma, follows at [facility].  4.  Hypokalemia. Replace through intravenous. 5.  Hypertension. Continue hydrochlorothiazide.  6.  Deep vein thrombosis prophylaxis with Lovenox.  7.  Code status is full code.  Time spent on this case was 40 minutes.   ____________________________ Leia Alf Lucciana Head, MD srs:LT D: 05/31/2014 20:25:00 ET T: 05/31/2014 21:01:00 ET JOB#: [id number]  cc: Alveta Heimlich R. Darvin Neighbours, MD, <Dictator> Perrin Maltese, MD [facility] [place] SIGNED 06/15/2014 10:45

## 2014-12-10 NOTE — Consult Note (Signed)
Chief Complaint:  Subjective/Chief Complaint seen for nausea vomiting, lower abdominal pain of intermittant frequency.   VITAL SIGNS/ANCILLARY NOTES: **Vital Signs.:   26-Jun-15 13:54  Vital Signs Type Routine  Temperature Temperature (F) 97.3  Celsius 36.2  Pulse Pulse 70  Respirations Respirations 20  Systolic BP Systolic BP 623  Diastolic BP (mmHg) Diastolic BP (mmHg) 66  Mean BP 86  Pulse Ox % Pulse Ox % 95  Pulse Ox Activity Level  At rest  Oxygen Delivery Room Air/ 21 %   Brief Assessment:  Cardiac Regular   Respiratory clear BS   Gastrointestinal details normal Soft  Bowel sounds normal  No rebound tenderness  tender to palpation in the luq.   Radiology Results: [place]:    23-Jun-15 21:15, US Abdomen Limited Survey  US Abdomen Limited Survey   REASON FOR EXAM:    vomiting, ruq abd pain, tender, GB distended on CT.  COMMENTS:   May transport without cardiac monitor    PROCEDURE: [place]  - US ABDOMEN LIMITED SURVEY  - Feb 08 2014  9:15PM     CLINICAL DATA:  Vomiting. Right upper quadrant abdominal pain.  Tenderness. Gallbladder distention on CT. Mantle cell lymphoma.    EXAM:  US ABDOMEN LIMITED - RIGHT UPPER QUADRANT    COMPARISON:  02/08/2014 CT    FINDINGS:  Gallbladder:  No gallstones or wall thickening visualized. No sonographic Murphy  sign noted. The gallbladder does appear slightly  prominent/distended.    Common bile duct:    Diameter: 7 mm, mildly prominent, without directly visualized  choledocholithiasis.    Liver:    No focal lesion identified. Within normal limits in parenchymal  echogenicity.     IMPRESSION:  1. Mild gallbladder hydrops, with minimal dilatation of the common  bile duct. We do not visualize a CBD stone. Causes for gallbladder  hydrops include fasting, viral infections, or biliary obstruction.  The lack of associated findings of cholecystitis argues against  acute cholecystitis. If the patient has true biliary colic or if  the  bilirubin level is elevated, nuclear medicine hepatobiliary scan  could be utilized to assess for patency of the cystic and common  bile duct.      Electronically Signed    By: Sherryl Barters M.D.    On: 02/08/2014 21:27       Verified By: Carron Curie, M.D.,  CT:    23-Jun-15 19:30, CT Abdomen and Pelvis Without Contrast  CT Abdomen and Pelvis Without Contrast   REASON FOR EXAM:    (1) diffuse abd pain, n/v. chemo 1 week ago. prior   CTA chest performed today; (2  COMMENTS:       PROCEDURE: CT  - CT ABDOMEN AND PELVIS W0  - Feb 08 2014  7:30PM     CLINICAL DATA:  Nausea, vomiting, diarrhea, and back pain. Shortness  of breath. Body aches. Mantle cell lymphoma with last chemotherapy  treatment last week. Abdominal pain.    EXAM:  CT ABDOMEN AND PELVIS WITHOUT CONTRAST    TECHNIQUE:  Multidetector CT imaging of the abdomen and pelvis was performed  following the standard protocol without IV contrast.    COMPARISON:  02/08/2014    FINDINGS:  Mild splenomegaly, splenic volume 570 cc.  Small hiatal hernia.    The liver, pancreas, and right adrenal gland appear normal. 1.7 x  1.3 cm left adrenal mass, internal density 10 Hounsfield units,  favoring adenoma.    Mildly distended gallbladder, diameter 4.7 cm.  Common bile duct  caliber 8 mm, mildly dilated. No directly visualized biliary stones.    There is some residual contrast medium in the renal collecting  systems, as expected. No specific renal abnormality. There is a  peripancreatic/porta hepatis node measuring 2.2 cm in short axis on  image 28 of series 2, abnormally prominent. A portacaval node  measures 1.8 cm in short axis on image 33 of series 2, likewise  prominent.    Appendix normal.  No pathologic pelvic adenopathy is observed.    Proximal sigmoid diverticulosis. There is wall thickening in the  vicinity of greatest concentration of diverticula.    Fluid filled distal large bowel and  rectum.    Contrast filled urinary bladder, without filling defect observed.  Bridging spurring, right sacroiliac joint.    Small umbilical hernia contains adipose tissue.     IMPRESSION:  1. Adenopathy in the porta hepatis with mild splenomegaly. These  findings are probably a manifestation of the patient's lymphoma.  2. Proximal sigmoid diverticulosis. Wall thickening in this vicinity  is probably due to the diverticulosis itself ; sigmoid colon mass  fortuitously positioned at the site of diverticulosis is considered  less likely, but correlate with the patient's history of colon  cancer screening.  3. Fluid in the distal colon indicating diarrheal process.  4. Somewhat distended gallbladder and mildly dilated common bile  duct, etiology uncertain. Sonography or nuclear medicine  hepatobiliary scan could be utilized for further characterization,  if clinically warranted.  5. Small hiatal hernia.      Electronically Signed    By: Sherryl Barters M.D.    On: 02/08/2014 19:51         Verified By: Carron Curie, M.D.,  Nuclear Med:    25-Jun-15 13:58, Hepatobiliary W/GB Ejec Fraction -NM  Hepatobiliary W/GB Ejec Fraction -NM   REASON FOR EXAM:    abd pain with food  COMMENTS:       PROCEDURE: NM  - NM HEPATO WITH GB EJECT FRACTION  - Feb 10 2014  1:58PM     CLINICAL DATA:  Abdominal pain    EXAM:  NUCLEAR MEDICINE HEPATOBILIARY IMAGING WITH GALLBLADDER EF    TECHNIQUE:  Sequential images of the abdomen were obtained out to 60 minutes  following intravenous administration of radiopharmaceutical. After  slow intravenous infusion of 1.87 micrograms Cholecystokinin,  gallbladder ejection fraction was determined.  RADIOPHARMACEUTICALS:  8.6 [employer] DE-08X Choletec    COMPARISON:  Ultrasound 02/08/2014    FINDINGS:  There is normal uptake of the tracer by the liver. Gallbladder and  CBD visualized at 20 min. Bowel activity at 30 min. Post CCK  gallbladder  ejection fraction is 69%.. At 30 min, normal ejection  fraction is greater than 30%.    The patient did experience stomach cramping during CCK infusion,  which subsided.     IMPRESSION:  1. No cystic duct obstruction. Post CCK gallbladder ejection  fraction is 69%.      Electronically Signed    By: Lahoma Crocker M.D.    On: 02/10/2014 14:20         Verified By: Ephraim Hamburger, M.D.,   Assessment/Plan:  Assessment/Plan:  Assessment 1) nausea vomiting in the setting of possible gastroenteritis, nsaid use. On ppi.   2) lower abdominal pain.  No previous colonoscopy, abnormal ct  with some thickening of the sigmoid colon of uncertain significance.   3) current recent treatment for mantle cell lymphoma   Plan 1) EGD  monday as clinically feasible, will need to recheck cbc and pt monday am.  Colonoscopy if this is not impfrmative versus colonoscopoy being done at [place]. Dr Vira Agar covering over the weekend.   Electronic Signatures: Loistine Simas (MD)  (Signed 26-Jun-15 15:58)  Authored: Chief Complaint, VITAL SIGNS/ANCILLARY NOTES, Brief Assessment, Radiology Results, Assessment/Plan   Last Updated: 26-Jun-15 15:58 by Loistine Simas (MD)

## 2014-12-10 NOTE — Consult Note (Signed)
PATIENT NAME:  Alexander [name], Alexander [name] MR#:  [id number] DATE OF BIRTH:  10/20/1955  DATE OF CONSULTATION:  02/10/2014  REFERRING PHYSICIAN:  Dr. Anselm Jungling. CONSULTING PHYSICIAN:  Theodore Demark, NP  REASON FOR CONSULTATION:  Evaluation of abdominal pain.   HISTORY OF PRESENT ILLNESS: I appreciate consult for a 59 year old Caucasian man with history of mantle cell lymphoma, completion of 4 cycles of Rituxan/bendamustine therapy, last 8  days ago, admitted with nausea, vomiting, and abdominal pain for evaluation of abdominal pain. The patient reports some recurrent right upper quadrant discomfort and intermittent dyspepsia over the last year.  Over the last few months, he has developed early satiety and reports loss of 30 pounds, was started on omeprazole 20 mg p.o. b.i.d. by his oncologist at the time of last visit. Evidently, had been taking Zantac for this as well. Does take narcotics chronically for his cancer pain.   The patient admitted with nausea, vomiting, diarrhea, abdominal pain, myalgias and fatigue after  eating with family at [employer] on Sunday. They all shared queso dip.  He reports they all became ill with the same symptoms the next day; however, his family recovered much sooner than he. Came to the ED Tuesday and was subsequently admitted, treated with fluid and antiemetics. Describes his current abdominal pain as bilateral lower, persistent, crampy in nature. Has had watery diarrhea that has improved. Had 1 stool early, early today. None since. Nausea and vomiting better and better overall; however, lower abdominal pain, exacerbated again this afternoon. After lunch, a tray from the regular diet menu was given. He has had a CT demonstrating some prominence of the gallbladder, common bile duct, irritation/diverticulosis in the sigmoid colon/porta hepatis node consistent with his lymphoma and an adrenal adenoma. Ultrasound demonstrated prominence of the gallbladder and common bile duct, but did  not favor cholecystitis or choledocholithiasis. Did recommend HIDA if his bilirubin was elevated. His bilirubin has been normal. He did have a HIDA today that was negative. Stomach cramping during the test on report. The patient states that this was bilateral lower abdominal cramping  consistent with what he has experienced this week and was not in the right upper quadrant. He had a CBC that was unremarkable. Liver panel normal. Metb with mild hypokalemia.  He has been hemodynamically stable. He has been afebrile,  states he has not had any black or bloody diarrhea and he has no other GI complaints currently.   PAST MEDICAL HISTORY: Mantle cell lymphoma as noted above.   SOCIAL HISTORY: No tobacco, illicits, or alcohol.  Lives with family.   FAMILY HISTORY: Significant for multiple cancers.   ALLERGIES: BEE STINGS.   HOME MEDICATIONS: Rituximab and bendamustine as given per the cancer clinic at the [facility], docusate sodium 200 mg p.o. day, Neupogen 1 injection once a day, antevert 25 mg 1 tablet b.i.d. p.r.n., Remeron 15 mg p.o. at bedtime, morphine extended release 15 mg b.i.d., omeprazole 20 mg p.o. b.i.d., ondansetron 8 mg q. 8 h. p.r.n., oxycodone 5 mg 1-2 tablets q. 4 h. p.r.n., Zantac 150 mg 1 tablet b.i.d., and verapamil 180 mg extended release once a day.   REVIEW OF SYSTEMS: Ten systems reviewed, unremarkable other than what is noted above.   PHYSICAL EXAMINATION:  VITAL SIGNS: Most recent vital signs: Temperature 98.5, pulse 71, respiratory rate 18, blood pressure 122/76, oxygen saturation of 95% on room air.  GENERAL: Well-appearing, pleasant man in no acute distress, resting comfortably in bed.  HEENT: Normocephalic, atraumatic. There is a 1.5  cm linear abrasion area next to his mustache. The patient says this was developed over the last few days and he attributes it to his vomiting. Mucous membranes pink and moist.  NECK: Supple. No JVD or lymphadenopathy.  CHEST: Respirations eupneic.  Lungs clear.  CARDIAC: S1, S2, RRR. No MRG. No appreciable edema.  ABDOMEN: Flat, bowel sounds x 4. Soft, nondistended. Currently, nontender. No guarding, rigidity, peritoneal signs, hepatosplenomegaly, masses or other abnormalities.  MUSCULOSKELETAL: Strength 5/5. MAEW x 4. Sensation intact. No sclerae or cyanosis, or edema.  SKIN: Warm, dry, pink. No erythema.  PSYCHIATRIC: Pleasant, calm, cooperative.  NEUROLOGIC: Alert, oriented x 3. Speech clear. No facial droop.   LABORATORY DATA: Most recent labs: Glucose 92, BUN 10, creatinine 0.81, sodium 140, potassium 3.4, chloride 104, GFR greater than 60, calcium 9.1, magnesium 2.0, lipase 94. Liver panel normal. CBC unremarkable. Did have a d-dimer that was elevated. Did undergo chest angiography as well that was negative for PE.   IMPRESSION AND PLAN:  1.  Probable gastroenteritis/abdominal wall pain. Doubt gallbladder disease. Full liquid diet, schedule antiemetics. If diarrhea comes back, would run stool tests prior initiating antibiotic therapy. Consider a colonoscopy when feasible, as the patient states he has not had any history of luminal evaluation.   2.  History of dyspepsia. His illness and his chronic narcotic therapy may come into play with this. Would recommend continuing his PPI and H2RA at current dose.   Thank you very much for this consult. These services were provided by Stephens November, MSN, [facility], in collaboration with Loistine Simas, M.D. with whom I have discussed this patient in full.   ____________________________ Theodore Demark, NP chl:ts D: 02/10/2014 17:26:52 ET T: 02/10/2014 17:49:06 ET JOB#: [id number]  cc: Theodore Demark, NP, <Dictator> [place] SIGNED 02/18/2014 7:50

## 2014-12-10 NOTE — Consult Note (Signed)
Brief Consult Note: Diagnosis: nausea vomiting.   Patient was seen by consultant.   Consult note dictated.   Comments: Appreciate consult for  59 y/o caucasian man with history of mantle cell lymphoma with completion of 4 cycles of Rituxan/bentomustine therapy: last 8d ago, adm with NVD/abd pain, for eval of abdominal pain.  Reports some recurrent ruq discomfort & intermittent dyspepsia over the last year. Over the last few months has developed early satiety and reports a loss of 30lb. Was started on Omeprazole 20mg  po bid by his oncologist at the time of last visit for this. Does take narcotics for his cancer pain. Patient admitted with NVD/abd pain, myalgias/fatigue after eating with fam at [facility] on Sunday. They shared queso dip. He reports they all became ill with the same sx the next day, however his family recovered much sooner than he. Came to ED  Tues & was  admitted. Treated with fluids/antiemetics. Describes his current abdominal pain as bilateral and lower, persistently crampy in nature. Has had watery diarrhea that has improved, one stool early today, none since.NV better & better overall, however lower abd pain exacerbated this pm after lunch-  a tray from the reg diet menu. Had CT demonstrating some prominence of the gb/CBD, irritation/diverticulosis in the sigmoid colon, porta hepatis node consistent with his lymphoma, and an adrenal adenoma. [place] demostrated prominence of the gallbladder and CBD but did not favor cholecystitis/choledolithiasis. Did rec HIDA if bili was elevated. Bili has been normal. Did have HIDA that was negative: note of stomach cramping during test- patient states this was bilateral lower abdominal cramping consistent with what he has experienced this week, and was not in the RUQ. CBC really unremarkable, liver panel normal, metb with mild hypokalemia. Afebrile VSS Has not had any black or bloody diarrhea- no other GI complaints currently.  Imp/plan:  Gastroenteritis/abdominal wall pain: doubt gb dz. Fliq diet. Schedule anti-em. If diarrhea back, stool tests /a abx. Consider cspy when feasible: no hx luml eval.                Dyspepsia- PPI.  Electronic Signatures: Stephens November H (NP)  (Signed 25-Jun-15 17:10)  Authored: Brief Consult Note   Last Updated: 25-Jun-15 17:10 by Theodore Demark (NP)

## 2014-12-10 NOTE — Discharge Summary (Signed)
PATIENT NAMETREY, [name] MR#:  [id number] DATE OF BIRTH:  1956-04-30  DATE OF ADMISSION:  05/31/2014 DATE OF DISCHARGE:  06/04/2014  DISCHARGE DIAGNOSES:  1.  Esophagitis.  2.  Chronic intermittent diarrhea.  3.  Mantle cell lymphoma.  4.  Dehydration.  5.  Hyperkalemia.   DISCHARGE MEDICATIONS: 1.  Zofran 8 mg oral 3 times a day. 2.  Oxycodone 5 mg 2 tablets every 4 hours as needed for pain.  3.  Haloperidol 0.5 mg oral 4 times a day as needed for anxiety, nervousness.  4.  Docusate sodium 100 mg 2 times a day as needed for constipation.  5.  Meclizine 25 mg oral 3 times a day as needed for dizziness.  6.  Albuterol 2 puffs inhaled 2 times a day.  7.  Prochlorperazine 5 mg 2 tablets once a day.  8.  Fluticasone 1 spray nasal 2 times a day.  9.  Morphine extended release 40 mg once a day at bedtime and 1 capsule in the morning.  10.  Hydrochlorothiazide 12.5 mg daily.  11.  Tamsulosin 0.4 mg daily.  12.  Toprol-XL 50 mg daily.  13.  Loperamide 2 mg oral 4 times a day as needed for diarrhea.  14.  Omeprazole 20 mg 2 tablets 2 times a day.   CONSULTATIONS: Dr. Candace Cruise with gastroenterology.  IMAGING STUDIES: Include a CT scan of the abdomen and pelvis with contrast that showed severe esophagitis and wall thickening of colon extending from splenic flexure to rectum. Borderline splenomegaly.   ADMITTING HISTORY AND PHYSICAL: Please see detailed H and P dictated previously. In brief, a 59 year old male patient with history of mantle cell lymphoma with on-and-off vomiting and diarrhea chronically, presented to the Emergency Room with worsening diarrhea and vomiting. The patient was found to have some mild esophagitis and colitis on CT scan of the abdomen and admitted to the hospitalist service.  HOSPITAL COURSE: 1.  Vomiting and diarrhea. The patient had initially started on antibiotics, although he was afebrile, normal white count. The patient's symptoms did improve mildly with IV fluid  rehydration. The patient had an EGD done which showed some esophagitis for which he is on PPI. Colonoscopy was done which showed no colitis after which antibiotics were stopped. His symptoms could be secondary to irritable bowel syndrome. I have started him on loperamide as needed, stopped antibiotics and the patient will be discharged home in a stable condition as his symptoms are significantly improved and his dehydration is resolved.  2.  Mantle cell lymphoma. Follow up as outpatient with oncology.   Prior to discharge, the patient has no abdominal tenderness. Lungs sound clear. Heart sounds are S1 and S2 without any murmurs.   DISCHARGE INSTRUCTIONS: Low-salt diet. Activity as tolerated. Follow-up with primary care physician in 1 to 2 weeks.   TIME SPENT ON DAY OF DISCHARGE IN DISCHARGE ACTIVITY: 40 minutes.  ____________________________ Alexander Alf Alexander Herrin, [name] srs:sb D: 06/07/2014 12:41:19 ET T: 06/07/2014 13:10:58 ET JOB#: [id number]  cc: Alexander [name], <Dictator> Alexander Carp [name] ELECTRONICALLY SIGNED 06/15/2014 10:45

## 2014-12-10 NOTE — Consult Note (Signed)
Overall symptoms resolving. Colonoscopy to cecum look completely normal. No evidence of colitis on left side of colon. Nevertheless, left sided biopsies taken. Full lquid diet ordered. If tolerated, advance to reg diet later today. Hopefully, discharge by tomorrow. Thanks  Electronic Signatures: Verdie Shire (MD)  (Signed on 16-Oct-15 11:50)  Authored  Last Updated: 16-Oct-15 11:50 by Verdie Shire (MD)

## 2014-12-10 NOTE — Consult Note (Signed)
Pt seen and examined. Please see K.Jerelene Redden' notes. Chronic and recurrent nausea and vomiting. Was supposed to get EGD last time he was here but never got scheduled after patient was transferred to [place]. Pt has been on daily protonix since. Now on bid dosage. CT suggests distal esophagitis. Pt also has diarrhea. CT suggests left sided colitis. Stool for c.diff has been ordered but not obtained yet. Abdomen tender mainly in LLQ area. There is some association between H.pylori and MALT lymphoma. So, will check for it. Recommend EGD 1st since patient will not be able to tolerate bowel prep at this time. Will need to r/o whether he has infectious colitis, inflammatory colitis, or ischemic colitis. [place] with Abx at this point. Plan EGD with bx in AM. Will follow. Thanks.  Electronic Signatures: Verdie Shire (MD)  (Signed on 14-Oct-15 13:56)  Authored  Last Updated: 14-Oct-15 13:56 by Verdie Shire (MD)

## 2014-12-10 NOTE — Consult Note (Signed)
PATIENT NAME:  Alexander [name], Alexander [name] MR#:  [id number] DATE OF BIRTH:  06/02/1956  DATE OF CONSULTATION:  06/01/2014  REFERRING PHYSICIAN:  Srikar R. [place], MD CONSULTING PHYSICIAN:  Lupita Dawn. Oh, MD/Nazeer Romney Jerelene Redden, ANP   REASON FOR CONSULTATION: Esophageal thickening on CT.   HISTORY OF PRESENT ILLNESS: This 59 year old patient of Dr. Lamonte Sakai has a history of mantle cell lymphoma, followed by Dr. Alba Destine, in the [place] in [place]. He has reported episodic nausea, vomiting and diarrhea spells since he started chemotherapy treatment last year. He did have to come off his chemotherapy 1 month ago for worsening symptoms. He says that he gets periods of lower abdominal discomfort, diarrhea, vomiting, that usually lasts a day or two and then eases off somewhat. Recently, he has had vomiting every day for 6 days with left lower quadrant pain that has been more persistent as well. He presented to the hospital with this history and had a CT of the abdomen and pelvis with contrast showing distal esophageal wall thickening and wall thickening in the colon extending from the splenic flexure to the rectum. There is concern over possible bacterial colitis and stool studies have been ordered but not yet collected as the patient has had no further diarrhea. He denies a history of EGD or colonoscopy.   Currently, he has had poor intake and poor oral intake over the last week and no solid food. He is hungry. He is requesting Jell-O or even soup. He has had chronic nausea today. Last emesis was yesterday. He denies hematemesis. blood or melena in the stool. He still has abdominal pain, left lower quadrant, rates at 8/10. He denies fevers or chills. He reports his lymphoma is present in the bone and it is no longer present in the blood. There has been no solid organ involvement. This patient was hospitalized 9/12 through 05/02/2014 with pneumonia and did receive 3 antibiotics. There is a stool study ordered to rule out  Clostridium difficile, which has not yet been completed.   PAST MEDICAL HISTORY:   1.  Mantle cell lymphoma.  2.  Hypertension, per chart review.   PAST SURGICAL HISTORY: None listed.   HOME MEDICATIONS:  1. Albuterol 2 puffs inhaled twice daily.  2. Docusate sodium 100 mg twice daily as needed for constipation.  3. Flonase 2 times daily as needed for congestion.  4. Haloperidol 0.5 mg 4 times daily as needed for anxiety and nervousness.  5. Hydrochlorothiazide 12.5 mg once a day.  6. Meclizine 25 mg 3 times daily as needed for dizziness.  7. Morphine 20 mg in the morning and 40 mg capsule at night.  8. Omeprazole 20 mg 2 tablets once daily.  9. Zofran 8 mg every 8 hours.  10. Oxycodone 5 mg 2 tablets every 4 hours as needed for pain.  11. Prochlorperazine 5 mg 2 tablets oral once a day.  12. Senna 8.6 mg 2 tablets 3 times daily.  13. Tamsulosin 0.4 mg once daily.  14. Toprol-XL 50 mg once daily.  15. Ranitidine 150 mg twice daily.   ALLERGIES: BEE STINGS.   HABITS: Negative tobacco, alcohol or illicit drug use.   FAMILY HISTORY: Father with cancer of the lung. Mother with cancer of the breast and cancer of the uterus. Maternal grandfather with lung cancer and brain cancer.   REVIEW OF SYSTEMS: Positive for fatigue, although the patient is able to walk a couple of blocks every day. He is on medical disability. He reports he just has  not felt very well lately. He does have history of heartburn and reflux and he has been on omeprazole regularly and those symptoms are in control. The nausea, vomiting and diarrhea spells are sporadic and in between these events he says he feels pretty good. He does have chronic bone pain for which he takes the morphine with good results. Remaining 10 systems otherwise negative.   LABORATORY DATA: Admission blood work notable for WBC 9.0, hemoglobin 15.0. Normal liver panel, including albumin 4.4. Laboratory studies today reveal unremarkable metabolic  panel, calcium down from 9.0 to 7.9. Hemoglobin 12.3, WBC 5.3.   URINALYSIS: Clear, yellow.   RADIOLOGY: CT of the abdomen and pelvis with contrast shows distal esophageal wall thickening. Unremarkable hepatobiliary pancreas. Spleen is borderline splenomegaly. Scattered sigmoid colon diverticula without evidence of diverticulitis. There is potentially mild wall thickening in the colon extending from the splenic flexure to the rectum. No obstruction or dilated bowel. Stable small left adrenal adenoma. Reduction in size but still enlarged porta hepatis lymph node, etiology uncertain.   Reticulonodular tree in bud opacities in the left lower lobe featuring atypical infectious bronchiolitis.   PHYSICAL EXAMINATION:  VITAL SIGNS: Temperature 98.1, pulse 56, respirations 20, blood pressure 116/71. Pulse oximetry on room air is 94%.  GENERAL: Chronically ill-appearing, middle-aged Caucasian male resting in bed. He looks comfortable.  HEENT: Head is normocephalic. Conjunctivae pink. Sclerae are anicteric. Oral mucosa is moist and intact.  NECK: Supple. Trachea midline.  CARDIAC: S1, S2 without murmur or gallop.  LUNGS: CTA. Respirations are nonlabored.  ABDOMEN: Soft, bowel sounds are present and normal. Positive tenderness in the left lower quadrant. Positive tenderness with percussion and cough. No rigidity, rebound, slight guarding noted.   RECTAL: Deferred.  SKIN: Warm and dry without rash or edema.  MUSCULOSKELETAL: No joint inflammation or swelling noted. Gait not evaluated.  NEUROLOGIC: Cranial nerves II-XII grossly intact.  PSYCHIATRIC: The patient is relaxed, comfortable. Excellent historian, alert and oriented x 4.   IMPRESSION: The patient presents with a history of mantle cell lymphoma and cyclical events of nausea, vomiting and diarrhea that has been ongoing for about a year. He says this started with chemotherapy and the side effects have been attributed to chemotherapy. He has been off  the drug for 1 month. He just had a recent severe bout of vomiting for 6 days in a row and the diarrhea started last night. Stool studies are pending. He is on Cipro and Flagyl. CT shows distal esophagitis and possible colitis. Etiology of the colitis to rule out bacterial, infectious, ischemia.   PLAN:  1. Recommend first evaluation with EGD. This can be arranged for tomorrow morning.  2. Check Helicobacter pylori IgG, has been association with lymphoma and a positive Helicobacter pylori infection.  3. The patient does take occasional nonsteroidal anti-inflammatories. He has been on chronic proton pump inhibitor, plus the ranitidine over the last year. No history of EGD studies. We want to rule out esophagitis, esophageal ulcer, gastritis, erosions, ulcers. Rule out involvement with lymphoma.  4. As the patient improves and he is able to drink the bowel preparation, eventual colonoscopy will need to be considered as well.  5. Continue with IV Cipro and Flagyl for  bacterial versus  ischemic colitis. His abdominal, tenderness will need to be monitored for improvement over the next few days.   This case was discussed with Dr. Verdie Shire in collaboration of care. Thank you for this consultation.   These services provided by Marval Regal,  MS, APRN, BC, ANP under collaborative agreement with Verdie Shire, MD.   ____________________________ Janalyn Harder. Jerelene Redden, ANP (Adult Nurse Practitioner) kam:TT D: 06/01/2014 15:27:00 ET T: 06/01/2014 16:36:31 ET JOB#: [id number]  cc: Joelene Millin A. Jerelene Redden, ANP (Adult Nurse Practitioner), <Dictator>  Janalyn Harder Sherlyn Hay, MSN, ANP-BC Adult Nurse Practitioner ELECTRONICALLY SIGNED 06/02/2014 10:17

## 2014-12-10 NOTE — Consult Note (Signed)
Nausea better today. Stool negative for c.diff. EGD essentially negative but distal esophageal biopsies taken for esophagitis. Since nausea better and stool negative, will proceed with colonscopy tomorrow with limited bowel prep to check for colitis. Thanks.  Electronic Signatures: Verdie Shire (MD)  (Signed on 15-Oct-15 10:20)  Authored  Last Updated: 15-Oct-15 10:20 by Verdie Shire (MD)

## 2014-12-10 NOTE — Discharge Summary (Signed)
PATIENT NAME:  Alexander [name], Alexander [name] MR#:  [id number] DATE OF BIRTH:  [birth year]  DATE OF ADMISSION:  04/30/2014 DATE OF DISCHARGE:  05/02/2014  PRIMARY CARE PHYSICIAN:  Non-local.    DISCHARGE DIAGNOSES:  1. Pneumonia.  2. Mantle lymphoma.  3. Hypertension.   4. Leukopenia.    CONDITION: Stable.   CODE STATUS: Full code.   HOME MEDICATIONS: Please refer to the medication reconciliation list.   DIET: Low-sodium diet.   ACTIVITY: As tolerated.   FOLLOWUP CARE: Follow up with PCP within 1-2 weeks. Follow up with oncologist in [place]. Follow up CBC with PCP.   REASON FOR ADMISSION: Cough, shortness of breath.   HOSPITAL COURSE: The patient is a 59 year old male with a history of mantle cell lymphoma on chemotherapy who presented in the ED with cough and shortness of breath. CAT scan of chest did not show any PE, but showed evidence of pneumonia. The patient was admitted for pneumonia and started broad-spectrum of antibiotics. For detailed history and physical examination please refer to the admission note dictated by Dr. Waldron Labs. On the admission date the patient had renal function and electrolytes were normal except for potassium of 3.2. Troponin less than 0.02. WBC 5.3, hemoglobin 12.8, platelets 320,000. After admission the patient has been treated with vancomycin, Cefotan, and Levaquin. The patient's cough and shortness of breath are much better. The patient's symptoms are much better, only has a mild cough.   Nausea, vomiting, possibly due to chemotherapy. After treatment of Zofran p.r.n. symptoms are better.    Hypertension is controlled.   Hypokalemia, improved after supplement.  Neutropenia: possible due to chemotherapy. follow up CBC as outpatient.  The patient is off oxygen, he is clinically stable, will be discharged to home today. Discussed the patient's discharge plan with the patient, nurse, case manager.   TIME SPENT: About 35 minutes.    ____________________________ Demetrios Loll, MD qc:bu D: 05/02/2014 11:53:43 ET T: 05/02/2014 12:58:08 ET JOB#: [id number]  cc: Demetrios Loll, MD, <Dictator> Demetrios Loll MD ELECTRONICALLY SIGNED 05/02/2014 16:06

## 2014-12-10 NOTE — H&P (Signed)
PATIENT NAME:  Alexander [name], Alexander [name] MR#:  [id number] DATE OF BIRTH:  [birth year]  DATE OF ADMISSION:  02/08/2014  REFERRING PHYSICIAN: Dr. Joni Fears  PRIMARY CARE PHYSICIAN: [facility]   CHIEF COMPLAINT: Nausea, vomiting.   HISTORY OF PRESENT ILLNESS: A 59 year old Caucasian gentleman with past medical history of mantle cell lymphoma on active chemotherapy, presenting with nausea, vomiting, diarrhea. This is his fifth round of chemotherapy, which he has receives through the [place] in [place], last treatment approximately one week ago. He now has one day duration of nausea, vomiting and diarrhea, nonbloody, nonbilious emesis, profuse watery diarrhea, multiple bouts a day, with associated fatigue, weakness, and mild abdominal pain, which he describes as cramping, in a periumbilical location, 3 to 4 out of 10 in intensity. No worsening or relieving factors. No association with oral intake. Denies any fevers or chills; however, is positive for multiple family members with past sick contacts with similar illnesses. Currently, no complaints at this time.    REVIEW OF SYSTEMS:  CONSTITUTIONAL: Positive for fatigue, weakness. Denies fevers, chills.  EYES: Denies blurred vision, double vision, or eye pain.  ENT: Denies tinnitus, ear pain or hearing loss.  RESPIRATORY: Denies cough, wheeze, shortness of breath.  CARDIOVASCULAR: Denies chest pain, palpitations, or edema.  GASTROINTESTINAL: Positive for nausea, vomiting, diarrhea and abdominal pain, as described above.  GENITOURINARY: Denies dysuria, hematuria.  ENDOCRINE: Denies nocturia or thyroid problems.  HEMATOLOGIC AND LYMPHATIC: Denies easy bruising or bleeding.  SKIN: Denies rashes or lesions.  MUSCULOSKELETAL: Denies pain in neck, back, shoulder, knees, hips or arthritic symptoms.  NEUROLOGIC: Denies paralysis, paresthesias.  PSYCHIATRIC: Denies anxiety or depressive symptoms.  Otherwise, full review of systems is performed and is negative.   PAST MEDICAL  HISTORY: Mantle cell lymphoma on active chemotherapy, last treated about one week ago.   SOCIAL HISTORY: Denies any alcohol, tobacco or drug usage.   FAMILY HISTORY: Positive for multiple cancers, however, no mantle cell.   ALLERGIES: BEE STINGS.   HOME MEDICATIONS: He does not have his medication list with him. The only medications he actually knows are oxycodone 10 mg p.o. q.4h. as needed for pain, which he takes approximately 1 to 2 times daily, as well as morphine extended release 15 mg p.o. b.i.d. As far as his other medications are concerned, his family had just left the hospital to go retrieve the medication list; we will update when we have this list.   PHYSICAL EXAMINATION: VITAL SIGNS: Temperature 98.5, heart rate 100, respirations 20, blood pressure 145/80, saturating 95% on room air. Weight 90.7 kg BMI 28.7.  GENERAL: Well-nourished, well-developed, Caucasian gentleman, currently in no acute distress.  HEAD: Normocephalic, atraumatic.  EYES: Pupils equal, round, and reactive to light. Extraocular muscles intact. No scleral icterus. MOUTH: Dry mucosal membrane. Dentition intact. No abscess noted.  EARS, NOSE, AND THROAT: Clear, without exudates. No external lesions.  NECK: Supple. No thyromegaly. No nodules. No JVD.  PULMONARY: Clear to auscultation bilaterally without wheezes, rales or rhonchi. No use of accessory muscles. Good respiratory effort.  CHEST: Nontender to palpation.  CARDIOVASCULAR: S1, S2. Regular rate and rhythm. No murmurs, rubs, or gallops. No edema. Pedal pulses 2+ bilaterally.  GASTROINTESTINAL: Soft. Minimal tenderness, periumbilical, without rebound or guarding. No motion tenderness. Nondistended. Positive bowel sounds. No hepatosplenomegaly.  MUSCULOSKELETAL: No swelling, clubbing, or edema. Range of motion full in all extremities.  NEUROLOGIC: Cranial nerves II through XII intact. No gross focal neurological deficits. Sensation intact. Reflexes intact. SKIN:  No ulceration, lesions rash, cyanosis. Skin  warm, dry. Turgor intact.  PSYCHIATRIC: Mood and affect within normal limits. Patient awake, alert and oriented x3. Insight and judgment intact.   LABORATORY DATA: Chest x-ray performed: No acute pulmonary abnormalities. CT of the chest performed, revealing no evidence of PE. There is old granulomatous disease and minimal right upper lobe infiltrate, scattered noncalcified, nonspecific pulmonary nodules. CT abdomen and pelvis also performed, reveals lymphadenopathy in the porta hepatitis with mild splenomegaly, consistent with the patient's lymphoma; however, no acute findings. Abdominal ultrasound performed revealing some gallbladder dilatation of 7 mm, without evidence of obstruction or infection. Remainder of laboratory data: Sodium 140, potassium 3.2, chloride 104, bicarbonate 27, BUN 10, creatinine 0.81, glucose 92. LFTs within normal limits. WBC 8.4, hemoglobin 14.5, platelets of 177. D-dimer 1026. Urinalysis: WBCs 12, RBCs 2, leukocyte esterase negative, nitrite negative.   ASSESSMENT AND PLAN: A 59 year old gentleman with history of mantle cell lymphoma on active chemotherapy, presenting with nausea, vomiting, diarrhea, with positive sick contacts. 1.  Intractable nausea and vomiting. Provide supportive care, intravenous fluid hydration as well as Zofran as needed.  2.  Hypokalemia. Replace potassium to a goal of 4.  3.  Mantle cell lymphoma with unknown medications. The family is actually going to get them now. He is due for what appears to be Neupogen, which the family will bring. Whenever they bring this medication and he is able to receive it. We will continue his pain medications per his pain regimen at home.  4.  Deep venous thrombosis prophylaxis with heparin subcutaneous.   CODE STATUS: The patient is full code.   TIME SPENT: 45 minutes.   ____________________________ Aaron Mose. Hower, MD dkh:cg D: 02/08/2014 22:57:35 ET T: 02/09/2014  00:38:29 ET JOB#: [id number]  cc: Aaron Mose. Hower, MD, <Dictator> DAVID Woodfin Ganja MD ELECTRONICALLY SIGNED 02/09/2014 3:42

## 2014-12-12 LAB — SURGICAL PATHOLOGY

## 2014-12-18 NOTE — Discharge Summary (Signed)
PATIENT NAME:  Alexander [name], Alexander [name] MR#:  [id number] DATE OF BIRTH:  [birth year]  PRIMARY CARE PHYSICIAN:  At the [place]. Also follows with Perrin Maltese, MD.  FINAL DIAGNOSES: 1.  Acute encephalopathy, drug adverse reaction secondary to Ativan.  2.  Nausea, history of Barrett's esophagus.  3.  History of mantle cell lymphoma.  4.  Hypertension.  5.  Asthma.   MEDICATIONS ON DISCHARGE: Include Flomax 0.4 mg at bedtime, hydrochlorothiazide 12.5 mg daily, omeprazole 20 mg 2 capsules in the morning, Toprol-XL 50 mg daily, morphine extended-release 30 mg 1 capsule 3 times a day, oxycodone 5 mg 1-3 tablets every 4 hours as needed for pain, Carafate 1 gram 3 times a day before meals, Colace 100 mg 2 capsules twice a day as needed for constipation, Flonase nasal spray 50 mcg per inhalation, 2 sprays once a day as needed, morphine 24 hour extended release 15 mg once a day at bedtime as needed for pain, Zofran 4 mg every 8 hours as needed for nausea and vomiting, Ventolin HFA 1 puff every 4 hours as needed for wheezing, Symbicort 80/4.5 two puffs twice a day, clotrimazole topical cream applied to affected area twice a day to feet, ketoconazole shampoo eery other day at bedtime, potassium chloride 10 mEq 2 tablets once a day, Senokot 8.6 mg 2 tablets twice a day, urea topical 20% topical cream to feet, promethazine 25 mg 1 tablet every 6 hours as needed for nausea. Stop taking lorazepam.   HOME HEALTH: Yes, physical therapy and nurse, resume previous orders.   DIET: Low sodium diet, regular consistency. Consider eating 8 small meals a day instead of 3 large meals.  FOLLOWUP:  With the palliative care team at the [facility] in 1-2 weeks with Dr. Lamonte Sakai.   HOSPITAL COURSE: The patient was admitted as an observation on January 12,  sent home on January 13. Was recently started on Ativan and came in with acute altered mental status, delirium. Ativan was stopped.  Mental status improved.   HOSPITAL COURSE PER PROBLEM LIST:   1.  For the patient's acute encephalopathy, this was an adverse drug reaction secondary to Ativan. Ativan was stopped and mental status improved. The patient was stable to go home.  2.  Nausea, history of Barrett esophagus. Looking back at old records, the patient has had numerous CT scans, numerous endoscopies. The patient stated he had a gastric emptying study that was okay at the [place] and an ultrasound over at the [place]. Was recently started on Ativan to try to help out with the nausea, but then caused the issue. He has been battling with this nausea for a while.  I started some promethazine 25 mg orally and I wrote a script for that, and he also has the Zofran at home. If this does not work, you can consider Kytril for nausea, but I advised him to eat small meals per day, maybe 8 per day. I advised him to take the smallest amount of pain medications as possible, considering tapering down on the pain medications, but I will leave that to the palliative care team.  3.  History of mantle cell lymphoma. Follow up with oncology as outpatient.  4.  Hypertension. Blood pressure is stable on usual medications.  5.  Asthma. Continue current inhalers.  TIME SPENT ON DISCHARGE: 35 minutes.    ____________________________ Tana Conch. Leslye Peer, MD rjw:LT D: 08/31/2014 16:36:19 ET T: 08/31/2014 17:42:54 ET JOB#: [id number]  cc: Tana Conch. Leslye Peer, MD, <Dictator> [facility]  Perrin Maltese, MD  Marisue Brooklyn MD ELECTRONICALLY SIGNED 09/06/2014 8:31

## 2014-12-18 NOTE — H&P (Signed)
PATIENT NAME:  Alexander [name], VILLAMAR MR#:  [id number] DATE OF BIRTH:  15-Oct-1955  DATE OF ADMISSION:  08/29/2014  REFERRING PHYSICIAN:  Wells Guiles L. Reita Cliche, MD  PRIMARY CARE PHYSICIAN:  Perrin Maltese, MD  ADMITTING PHYSICIAN:  Juluis Mire, MD   CHIEF COMPLAINT:  Altered sensorium with confusion.   HISTORY OF PRESENT ILLNESS:  This is a 59 year old Caucasian male with a past medical history of mantle cell lymphoma status post chemotherapy, history of asthma, hypertension, Barrett esophagitis, and chronic pain syndrome on multiple chronic medications, who was brought to the Emergency Room with the complaints of noting confusion, which started about a day earlier and which has been progressive since then. According to the patient's wife, who is with the patient at this time, the patient was recently seen at [facility] hospital last week for nausea, vomiting, and diarrhea. He has been using Ativan 1 mg 3 times a day on top of his chronic medications since his discharge from the [facility] hospital and for the past 24 hours has been noticed to be lethargic with decreased mentation. Hence, he was brought to the Emergency Room for further evaluation. No history of any fever, cough, chest pain, or shortness of breath. No focal weakness or numbness. No dizziness or loss of consciousness. No urinary symptoms. No GI symptoms such as nausea, vomiting, diarrhea, or abdominal pain. In the Emergency Room, the patient was evaluated by the ED physician and workup was essentially normal with all of the labs normal and CT of the head noncontrast study negative for any acute intracranial pathology. His altered sensorium was thought secondary due to this recent Ativan usage, and he is being observed in the Emergency Room. At the current time, the patient is comfortably lying in the bed and states that he is feeling okay except for lethargy, but otherwise he is alert, awake, and oriented x 3, able to give history, and following all commands. The  patient is admitted for observation and for further evaluation at this time.   PAST MEDICAL HISTORY: 1.  Mantle cell lymphoma status post chemotherapy administered in August 2015. The patient is under care of oncologist at [facility].  2.  Bronchial asthma.  3.  Hypertension.  4.  Barrett esophagitis status post esophageal ablation.  5.  Chronic pain syndrome.   PAST SURGICAL HISTORY:  Esophageal ablation for Barrett esophagitis.  ALLERGIES:  BEE STING.   FAMILY HISTORY:  Multiple cancers in his family members. No history of any mantle cell lymphoma.   SOCIAL HISTORY:  He is married and lives with his wife. He is a retired [employer]. He has a history of smoking and alcohol use in the past, but quit many years ago. Denies any alcohol, smoking, or substance abuse in the recent past.   HOME MEDICATIONS:  1.  Carafate 1 gram 1 tablet orally 4 times a day.  2.  Docusate sodium 100 mg capsule 1 capsule 2 times a day as needed for constipation.  3.  Duragesic-50, 50 mcg/h extended release one patch transdermally every 72 hours.  4.  Fluticasone nasal spray 1 spray in each nostril 2 times a day as needed.  5.  Haloperidol 0.5 mg 1 tablet orally 4 times a day as needed for anxiety.  6.  Hydrochlorothiazide 12.5 mg oral capsule 1 capsule once a day.  7.  Meclizine 25 mg tablet 1 tablet orally 3 times a day as needed for dizziness.  8.  MiraLax oral 17 grams orally once a  day.  9.  Morphine 24-hour extended release 45 mg orally once a day at bedtime as needed for pain.  10.  Morphine 30 mg in 24 hours oral capsule extended release 1 capsule 3 times a day.  11.  Omeprazole 20 mg oral tablet 1 tablet orally once a day.  12.  Ondansetron 4 mg  tablet 1 tablet 3 times a day as needed for nausea and vomiting.  13.  Oxycodone 5 mg 4 tablets orally every 4 hours as needed for pain.  14.  Tamsulosin 0.4 mg oral capsule 1 capsule orally once at bedtime.  15.  Toprol-XL 50 mg tablet orally 1 tablet in the  morning.  16.  Ventolin HFA 90 mcg/inhalation 1 puff 2 times a day as needed for wheezing.   REVIEW OF SYSTEMS: CONSTITUTIONAL:  Negative for fatigue. He does have some generalized weakness with lethargy for the past one day.  EYES:  Negative for blurred vision or double vision. No pain. No redness. No discharge.  EARS, NOSE, AND THROAT:  Negative for tinnitus, ear pain, or hearing loss. No epistaxis, nasal discharge, or difficulty swallowing.  RESPIRATORY:  Negative for cough, wheezing, dyspnea, hemoptysis, or painful respiration.  CARDIOVASCULAR:  Negative for chest pain, palpitations, dizziness, syncopal episodes, orthopnea, dyspnea on exertion, or pedal edema.  GASTROINTESTINAL:  Negative for nausea, vomiting, diarrhea, constipation, abdominal pain, hematemesis, melena, or rectal bleeding.  GENITOURINARY:  Negative for dysuria, frequency, urgency, hematuria, or incontinence.  ENDOCRINE:  Negative for polyuria, nocturia, or heat or cold intolerance.  HEMATOLOGIC AND LYMPHATIC:  Negative for anemia, easy bruising or bleeding, or swollen glands.  INTEGUMENTARY:  Negative for acne, skin rash, or lesions.  MUSCULOSKELETAL:  Chronic pain for which she takes chronic pain medications and which is under control.  NEUROLOGICAL:  Generalized weakness but denies any focal weakness or numbness. No history of CVA, TIA, or seizure disorder.  PSYCHIATRIC:  Negative for anxiety, insomnia, or depression.   PHYSICAL EXAMINATION: VITAL SIGNS:  Temperature 97.4 degrees Fahrenheit, pulse rate 72 per minute, respirations 16 per minute, blood pressure 132/84, oxygen saturation 96% on room air.  GENERAL:  Well developed, well nourished, alert and oriented, in no acute distress, comfortably resting in the bed, pleasant and cooperative.  HEAD:  Atraumatic, normocephalic.  EYES:  Pupils are equal and react to light and accommodation. No conjunctival pallor. No scleral icterus. Extraocular movements are intact.  NOSE:   No drainage. No lesions.  EARS:  No drainage. No external lesions.  ORAL CAVITY:  No mucosal lesions. No exudates.  NECK:  Supple. No JVD. No thyromegaly. No carotid bruit. Range of motion of the neck is normal.  RESPIRATORY:  Good respiratory effort. Not using accessory muscles of respiration. Bilateral vesicular breath sounds. No rales or rhonchi.  CARDIOVASCULAR:  S1, S2 regular. No murmurs, gallops, or clicks appreciated. Peripheral pulses are equal at carotid, femoral, and pedal pulses. No peripheral edema.  GASTROINTESTINAL:  Abdomen is soft and nontender. No hepatosplenomegaly. No masses. No guarding. No rigidity. Bowel sounds are present and equal in all 4 quadrants.  GENITOURINARY:  Deferred.  MUSCULOSKELETAL:  No joint tenderness or effusion. Range of motion is adequate. Strength and tone are equal bilaterally in both upper and lower extremities.   SKIN:  Inspection is within normal limits. No obvious wounds.  LYMPHATIC:  No cervical lymphadenopathy.  VASCULAR:  Good dorsalis pedis and posterior tibial pulses.  NEUROLOGIC:  Alert, awake, and oriented x 3. Cranial nerves II through XII are grossly  intact. No sensory deficit. Motor strength is 5/5 in both upper and lower extremities. DTRs are 2+ bilaterally and symmetrical. Plantars are downgoing on both sides.  PSYCHIATRIC:  Alert, awake, and oriented x 3. Judgment and insight are adequate. Memory and mood are within normal limits.   LABORATORY DATA:  Serum glucose is 100, BUN 10, creatinine 0.96, sodium 140, potassium 4.1, chloride 106, bicarbonate 28, total calcium 8.7, total protein 6.8, albumin 3.8, total bilirubin 0.4, alkaline phosphatase 99, AST 29, and ALT 24. Troponin is less than 0.02. WBC is 4.1, hemoglobin 12.9, hematocrit 38.3, platelet count 164,000. Urinalysis is unremarkable.   IMAGING STUDIES:  CT of the head noncontrast study:  No acute intracranial abnormalities.   EKG:  Normal sinus rhythm with ventricular rate of 68  beats per minute.    ASSESSMENT AND PLAN:  This is a 59 year old Caucasian male with past medical history of mantle cell lymphoma status post chemotherapy in August 2015 and history of hypertension, chronic pain syndrome on chronic multiple pain medications, bronchial asthma, and Barrett esophagitis, who presents with altered sensorium of 1 to 2 days' duration. The patient was recently seen at [place] last week for nausea, vomiting, and diarrhea and was prescribed Ativan, which he has been using 1 tablet 3 times a day for the past one week, and the patient's wife stated that he started having this decreased sensorium probably secondary due to medication.   1.  Altered sensorium. Symptoms started about 2 days ago. The patient was recently seen at [facility] hospital and was given Ativan and using Ativan 1 mg t.i.d. According to the patient's wife, symptoms started after starting this new medication. Workup with all labs and CT of the head is negative. The patient is alert, awake, and oriented. Examination is normal. Most likely altered sensorium is secondary due to Ativan. Plan:  Admit for observation, hold off Ativan and other chronic pain medications for now, monitor with neurological checks, and consider further workup if symptoms persist or he develops new symptoms.  2.  Mantle cell lymphoma status post chemotherapy in August 2015, currently stable. The patient is under care of oncology at [facility]. Continue care per oncology at [facility].  3.  Hypertension, controlled on home medications. Continue same.  4.  Bronchial asthma, stable. No acute symptoms. Continue home medications.  5.  Chronic pain. The patient is on multiple chronic pain medications at home. We will hold off chronic pain medications for now because of decreased sensorium. Monitor for now and consider restarting medications once the patient's mental status is completely normal.  6.  History of gastroesophageal reflux disease/Barrett esophagitis, stable on  proton pump inhibitor. Continue same.  7.  Deep vein thrombosis prophylaxis with subcutaneous Lovenox.  8.  Gastrointestinal prophylaxis with proton pump inhibitor.   CODE STATUS:  Full code.   TIME SPENT:  50 minutes.    ____________________________ Juluis Mire, MD enr:nb D: 08/30/2014 01:18:45 ET T: 08/30/2014 01:49:48 ET JOB#: [id number]  cc: Juluis Mire, MD, <Dictator> Perrin Maltese, MD Juluis Mire MD ELECTRONICALLY SIGNED 08/31/2014 12:28

## 2014-12-18 NOTE — H&P (Signed)
PATIENT NAME:  Alexander [name], Alexander [name] MR#:  [id number] DATE OF BIRTH:  [birth year]  DATE OF ADMISSION:  11/28/2014  REFERRING PHYSICIAN:  Yetta Numbers. Karma Greaser, MD  PRIMARY CARE PHYSICIAN:  Perrin Maltese, MD  CHIEF COMPLAINT:  Nausea, vomiting, diarrhea.   HISTORY OF PRESENT ILLNESS:  This is a 59 year old Caucasian gentleman with a history of Barrett esophagitis as well as mantle cell lymphoma, completed chemotherapy in the end of 2015, presenting with nausea, vomiting, and diarrhea. He describes a 3 to 4-day duration of nausea, vomiting, and diarrhea with nonbloody, nonbilious emesis and watery diarrhea with associated poor p.o. intake. Symptoms progressively worsened, and he now has associated fatigue and weakness as well as lightheadedness with a near-syncopal episode when going from the sitting to standing position. He also attests to having abdominal pain, left lower quadrant in location, cramping in quality, 3 to 4 out of 10 in intensity, nonradiating, no worsening or relieving factors.   REVIEW OF SYSTEMS: CONSTITUTIONAL:  No fevers or chills. Positive for fatigue and weakness.  EYES:  Denies blurred vision, double vision, or eye pain.  EARS, NOSE, THROAT:  Denies tinnitus, ear pain, or hearing loss.  RESPIRATORY:  Denies cough, wheeze, or shortness of breath.  CARDIOVASCULAR:  Denies chest pain, palpitations, or edema.  GASTROINTESTINAL:  Positive for nausea, vomiting, diarrhea, and abdominal pain.  GENITOURINARY:  No dysuria or hematuria.  ENDOCRINE:  Denies nocturia or thyroid problems.  HEMATOLOGIC AND LYMPHATIC:  Denies easy bruising or bleeding.  SKIN:  Denies rashes or lesions.  MUSCULOSKELETAL:  Denies pain in the neck, back, shoulders, knees, or hips or arthritic symptoms.  NEUROLOGIC:  Denies paralysis or paresthesias.  PSYCHIATRIC:  Denies anxiety or depressive symptoms.   Otherwise, full review of systems performed by me is negative.   PAST MEDICAL HISTORY:  Includes COPD,  non-oxygen dependent; gastroesophageal reflux disease with esophagitis and Barrett esophagus; mantle cell lymphoma, finishing chemotherapy at the end of 2015; essential hypertension.   SOCIAL HISTORY:  Positive for remote alcohol as well as tobacco use. No current usage.   FAMILY HISTORY:  No known cardiovascular or pulmonary disorders.   ALLERGIES:  ATIVAN AS WELL AS BEE STINGS.   HOME MEDICATIONS:  Include oxycodone 5 mg p.o. q. 4 hours as needed for pain, Flomax 0.4 mg p.o. at bedtime, Zofran 8 mg every 8 hours as needed for nausea and vomiting, promethazine 25 mg p.o. q. 6 hours as needed for nausea and vomiting, Toprol-XL 50 mg p.o. daily, Symbicort 80/4.5 mcg inhalation 2 puffs b.i.d., Ventolin 90 mcg inhalation 1 puff q. 4 hours as needed for shortness of breath, hydrochlorothiazide 12.5 mg p.o. daily, Bentyl 20 mg p.o. up to 4 times daily as needed, Colace 100 mg 2 capsules p.o. twice daily as needed, Senokot 8.6 mg 2 tabs p.o. b.i.d., potassium 10 mEq 2 tabs daily, Carafate 1 gram 3 times daily before meals, Flonase 50 mcg inhalation 2 sprays daily as needed, Prilosec 20 mg 2 capsules daily.   PHYSICAL EXAMINATION: VITAL SIGNS:  Temperature 98.9, heart rate 81, respirations 19, blood pressure 134/82, saturating 99% on room air. Weight 81.6 kg, BMI 25.8.   GENERAL:  Well-nourished, well-developed, Caucasian gentleman currently in no acute distress.  HEAD:  Normocephalic, atraumatic.  EYES:  Pupils are equal, round, and reactive to light. Extraocular muscles are intact. No scleral icterus.  MOUTH:  Dry mucosal membranes. Dentition is poor. No abscess noted.  EARS, NOSE, AND THROAT:  Clear without exudates. No external lesions.  NECK:  Supple. No thyromegaly. No nodules. No JVD.  PULMONARY:  Clear to auscultation bilaterally without wheezes, rales, or rhonchi. No use of accessory muscles. Good respiratory effort.  CHEST:  Nontender to palpation.  CARDIOVASCULAR:  S1 and S2, regular rate and  rhythm. No murmurs, rubs, or gallops. No edema. Pedal pulses 2+ bilaterally.  GASTROINTESTINAL:  Soft. Minimal tenderness in the left lower quadrant without rebound, guarding, or motion tenderness. Positive bowel sounds. No hepatosplenomegaly.  MUSCULOSKELETAL:  No swelling, clubbing, or edema. Range of motion is full in all extremities.  NEUROLOGIC:  Cranial nerves II through XII are intact. No gross focal neurological deficits. Sensation is intact. Reflexes are intact.  SKIN:  No ulcerations, lesions, rashes, or cyanosis. Skin is warm and dry. Turgor is intact.  PSYCHIATRIC:  Mood and affect are within normal limits. The patient is awake, alert, and oriented x 3. Insight and judgment are intact.   LABORATORY DATA:  Sodium is 130, potassium 2.7, chloride 105, bicarbonate 25, BUN 13, creatinine 0.63, glucose 95, and LFTs within normal limits. WBC is 6.9, hemoglobin 15, and platelets are 200,000. Urinalysis is negative for evidence of infection.   ASSESSMENT AND PLAN:  A 59 year old Caucasian gentleman with a history of Barrett esophagus, presenting with nausea, vomiting, and diarrhea.   1.  Intractable nausea and vomiting. IV fluid hydration and p.r.n. medications including Zofran. We will attempt to give some Versed as well. He states he has allergy to Ativan, which caused some confusion. If no improvement, once again, we will try Versed. If no improvement by the morning, we will consult gastroenterology, Dr. Candace Cruise, as he follows with the patient on a regular basis.  2.  Near syncopal episode. We will check orthostatic vital signs and provide IV fluid hydration as stated above.  3.  Chronic obstructive pulmonary disease, unspecified type. Continue his Symbicort.  4.  Hypertension, essential, Toprol-XL.  5.  Gastroesophageal reflux with esophagitis. Proton pump inhibitor therapy.  6.  Venous thromboembolism prophylaxis with heparin subcutaneously.   CODE STATUS:  The patient is a full code.   TIME  SPENT:  35 minutes.   ____________________________ Aaron Mose. Hower, MD dkh:nb D: 11/28/2014 00:03:54 ET T: 11/28/2014 00:18:31 ET JOB#: [id number]  cc: Aaron Mose. Hower, MD, <Dictator> DAVID Woodfin Ganja MD ELECTRONICALLY SIGNED 11/29/2014 3:10

## 2014-12-18 NOTE — H&P (Signed)
PATIENT NAME:  Alexander [name], Alexander [name] MR#:  [id number] DATE OF BIRTH:  1955/11/19  DATE OF ADMISSION:  08/30/2014  REFERRING PHYSICIAN:  Wells Guiles L. Reita Cliche, MD  PRIMARY CARE PHYSICIAN:  Perrin Maltese, MD  ADMITTING PHYSICIAN:  Juluis Mire, MD   CHIEF COMPLAINT:  Altered sensorium with confusion.   HISTORY OF PRESENT ILLNESS:  This is a 59 year old Caucasian male with a past medical history of mantle cell lymphoma status post chemotherapy, history of asthma, hypertension, Barrett esophagitis, and chronic pain syndrome on multiple chronic medications, who was brought to the Emergency Room with the complaints of noting confusion, which started about a day earlier and which has been progressive since then. According to the patient's wife, who is with the patient at this time, the patient was recently seen at [facility] hospital last week for nausea, vomiting, and diarrhea. He has been using Ativan 1 mg 3 times a day on top of his chronic medications since his discharge from the [facility] hospital and for the past 24 hours has been noticed to be lethargic with decreased mentation. Hence, he was brought to the Emergency Room for further evaluation. No history of any fever, cough, chest pain, or shortness of breath. No focal weakness or numbness. No dizziness or loss of consciousness. No urinary symptoms. No GI symptoms such as nausea, vomiting, diarrhea, or abdominal pain. In the Emergency Room, the patient was evaluated by the ED physician and workup was essentially normal with all of the labs normal and CT of the head noncontrast study negative for any acute intracranial pathology. His altered sensorium was thought secondary due to this recent Ativan usage, and he is being observed in the Emergency Room. At the current time, the patient is comfortably lying in the bed and states that he is feeling okay except for lethargy, but otherwise he is alert, awake, and oriented x 3, able to give history, and following all commands. The  patient is admitted for observation and for further evaluation at this time.   PAST MEDICAL HISTORY: 1. Mantle cell lymphoma status post chemotherapy administered in August 2015. The patient is under care of oncologist at [facility].  2. Bronchial asthma.  3. Hypertension.  4. Barrett esophagitis status post esophageal ablation.  5. Chronic pain syndrome.   PAST SURGICAL HISTORY:  Esophageal ablation for Barrett esophagitis.  ALLERGIES:  BEE STING.   FAMILY HISTORY:  Multiple cancers in his family members. No history of any mantle cell lymphoma.   SOCIAL HISTORY:  He is married and lives with his wife. He is a retired [employer] manager. He has a history of smoking and alcohol use in the past, but quit many years ago. Denies any alcohol, smoking, or substance abuse in the recent past.   HOME MEDICATIONS:  1. Carafate 1 gram 1 tablet orally 4 times a day.  2. Docusate sodium 100 mg capsule 1 capsule 2 times a day as needed for constipation.  3. Duragesic-50, 50 mcg/h extended release one patch transdermally every 72 hours.  4. Fluticasone nasal spray 1 spray in each nostril 2 times a day as needed.  5. Haloperidol 0.5 mg 1 tablet orally 4 times a day as needed for anxiety.  6. Hydrochlorothiazide 12.5 mg oral capsule 1 capsule once a day.  7. Meclizine 25 mg tablet 1 tablet orally 3 times a day as needed for dizziness.  8. MiraLax oral 17 grams orally once a day.  9. Morphine 24-hour extended release 45 mg orally once a day  at bedtime as needed for pain.  10. Morphine 30 mg in 24 hours oral capsule extended release 1 capsule 3 times a day.  11. Omeprazole 20 mg oral tablet 1 tablet orally once a day.  12. Ondansetron 4 mg  tablet 1 tablet 3 times a day as needed for nausea and vomiting.  13. Oxycodone 5 mg 4 tablets orally every 4 hours as needed for pain.  14. Tamsulosin 0.4 mg oral capsule 1 capsule orally once at bedtime.  15. Toprol-XL 50 mg tablet orally 1 tablet in the morning.   16. Ventolin HFA 90 mcg/inhalation 1 puff 2 times a day as needed for wheezing.   REVIEW OF SYSTEMS: CONSTITUTIONAL:  Negative for fatigue. He does have some generalized weakness with lethargy for the past one day.  EYES:  Negative for blurred vision or double vision. No pain. No redness. No discharge.  EARS, NOSE, AND THROAT:  Negative for tinnitus, ear pain, or hearing loss. No epistaxis, nasal discharge, or difficulty swallowing.  RESPIRATORY:  Negative for cough, wheezing, dyspnea, hemoptysis, or painful respiration.  CARDIOVASCULAR:  Negative for chest pain, palpitations, dizziness, syncopal episodes, orthopnea, dyspnea on exertion, or pedal edema.  GASTROINTESTINAL:  Negative for nausea, vomiting, diarrhea, constipation, abdominal pain, hematemesis, melena, or rectal bleeding.  GENITOURINARY:  Negative for dysuria, frequency, urgency, hematuria, or incontinence.  ENDOCRINE:  Negative for polyuria, nocturia, or heat or cold intolerance.  HEMATOLOGIC AND LYMPHATIC:  Negative for anemia, easy bruising or bleeding, or swollen glands.  INTEGUMENTARY:  Negative for acne, skin rash, or lesions.  MUSCULOSKELETAL:  Chronic pain for which she takes chronic pain medications and which is under control.  NEUROLOGICAL:  Generalized weakness but denies any focal weakness or numbness. No history of CVA, TIA, or seizure disorder.  PSYCHIATRIC:  Negative for anxiety, insomnia, or depression.   PHYSICAL EXAMINATION: VITAL SIGNS:  Temperature 97.4 degrees Fahrenheit, pulse rate 72 per minute, respirations 16 per minute, blood pressure 132/84, oxygen saturation 96% on room air.  GENERAL:  Well developed, well nourished, alert and oriented, in no acute distress, comfortably resting in the bed, pleasant and cooperative.  HEAD:  Atraumatic, normocephalic.  EYES:  Pupils are equal and react to light and accommodation. No conjunctival pallor. No scleral icterus. Extraocular movements are intact.  NOSE:  No  drainage. No lesions.  EARS:  No drainage. No external lesions.  ORAL CAVITY:  No mucosal lesions. No exudates.  NECK:  Supple. No JVD. No thyromegaly. No carotid bruit. Range of motion of the neck is normal.  RESPIRATORY:  Good respiratory effort. Not using accessory muscles of respiration. Bilateral vesicular breath sounds. No rales or rhonchi.  CARDIOVASCULAR:  S1, S2 regular. No murmurs, gallops, or clicks appreciated. Peripheral pulses are equal at carotid, femoral, and pedal pulses. No peripheral edema.  GASTROINTESTINAL:  Abdomen is soft and nontender. No hepatosplenomegaly. No masses. No guarding. No rigidity. Bowel sounds are present and equal in all 4 quadrants.  GENITOURINARY:  Deferred.  MUSCULOSKELETAL:  No joint tenderness or effusion. Range of motion is adequate. Strength and tone are equal bilaterally in both upper and lower extremities.   SKIN:  Inspection is within normal limits. No obvious wounds.  LYMPHATIC:  No cervical lymphadenopathy.  VASCULAR:  Good dorsalis pedis and posterior tibial pulses.  NEUROLOGIC:  Alert, awake, and oriented x 3. Cranial nerves II through XII are grossly intact. No sensory deficit. Motor strength is 5/5 in both upper and lower extremities. DTRs are 2+ bilaterally and symmetrical. Plantars  are downgoing on both sides.  PSYCHIATRIC:  Alert, awake, and oriented x 3. Judgment and insight are adequate. Memory and mood are within normal limits.   LABORATORY DATA:  Serum glucose is 100, BUN 10, creatinine 0.96, sodium 140, potassium 4.1, chloride 106, bicarbonate 28, total calcium 8.7, total protein 6.8, albumin 3.8, total bilirubin 0.4, alkaline phosphatase 99, AST 29, and ALT 24. Troponin is less than 0.02. WBC is 4.1, hemoglobin 12.9, hematocrit 38.3, platelet count 164,000. Urinalysis is unremarkable.   IMAGING STUDIES:  CT of the head noncontrast study:  No acute intracranial abnormalities.   EKG:  Normal sinus rhythm with ventricular rate of 68  beats per minute.    ASSESSMENT AND PLAN:  This is a 59 year old Caucasian male with past medical history of mantle cell lymphoma status post chemotherapy in August 2015 and history of hypertension, chronic pain syndrome on chronic multiple pain medications, bronchial asthma, and Barrett esophagitis, who presents with altered sensorium of 1 to 2 days' duration. The patient was recently seen at [facility] last week for nausea, vomiting, and diarrhea and was prescribed Ativan, which he has been using 1 tablet 3 times a day for the past one week, and the patient's wife stated that he started having this decreased sensorium probably secondary due to medication.   1. Altered sensorium. Symptoms started about 2 days ago. The patient was recently seen at [facility] hospital and was given Ativan and using Ativan 1 mg t.i.d. According to the patient's wife, symptoms started after starting this new medication. Workup with all labs and CT of the head is negative. The patient is alert, awake, and oriented. Examination is normal. Most likely altered sensorium is secondary due to Ativan. Plan:  Admit for observation, hold off Ativan and other chronic pain medications for now, monitor with neurological checks, and consider further workup if symptoms persist or he develops new symptoms.  2. Mantle cell lymphoma status post chemotherapy in August 2015, currently stable. The patient is under care of oncology at [facility]. Continue care per oncology at [facility].  3. Hypertension, controlled on home medications. Continue same.  4. Bronchial asthma, stable. No acute symptoms. Continue home medications.  5. Chronic pain. The patient is on multiple chronic pain medications at home. We will hold off chronic pain medications for now because of decreased sensorium. Monitor for now and consider restarting medications once the patient's mental status is completely normal.  6. History of gastroesophageal reflux disease/Barrett esophagitis, stable on  proton pump inhibitor. Continue same.  7. Deep vein thrombosis prophylaxis with subcutaneous Lovenox.  8. Gastrointestinal prophylaxis with proton pump inhibitor.   CODE STATUS:  FULL CODE.   TIME SPENT:  50 minutes.    ____________________________ Juluis Mire, MD enr:nb D: 08/30/2014 01:18:00 ET T: 08/30/2014 01:49:48 ET JOB#: [id number]  cc: Perrin Maltese, MD Juluis Mire, MD, <Dictator> Juluis Mire MD ELECTRONICALLY SIGNED 09/06/2014 18:04

## 2014-12-18 NOTE — Discharge Summary (Signed)
PATIENT NAME:  Alexander [name], Alexander [name] MR#:  [id number] DATE OF BIRTH:  [birth year]  DATE OF ADMISSION:  11/28/2014 DATE OF DISCHARGE:  11/29/2014  DISCHARGE DIAGNOSES:  1. Vomiting due to Barrett's esophagus with chronic nausea.  2. Dehydration.  3. Mantle cell lymphoma.  4. Chronic obstructive pulmonary disease.  5. Hypertension.   DISCHARGE MEDICATIONS:  1. Tamsulosin 0.4 mg daily.   2. Omeprazole 20 mg 2 capsules daily.  3. Toprol-XL 50 mg daily.  4. Oxycodone 5 mg 1-3 tablets every 4 hours as needed for pain.  5. Carafate 1 gram oral 3 times a day.  6. Docusate sodium 100 mg 2 tablets 2 times a day.  7. Fluticasone nasal 2 sprays nasal once a day as needed.  8. Zofran 8 mg oral every 8 hours as needed.  9. Ventolin HFA 1 puff inhaled every 4 hours as needed.  10.  Symbicort 80/4.5 two puffs inhaled 2 times a day.  11.  Clotrimazole topical cream 2 times a day.  12.  Potassium chloride extended release 20 mEq daily.  13.  Senna 2 tablets oral 2 times a day.  14.  Hydrochlorothiazide 12.5 mg oral once a day.  15.  Bentyl 20 mg oral 4 times a day as needed.  16.  Promethazine 25 mg 1 or 2 tablets oral every 4-6 hours as needed.   The patient has been advised to stop his Phenergan while he is taking his Reglan.   DISCHARGE INSTRUCTIONS:  Low-sodium diet. Activity as tolerated.  Follow up with Dr. Candace Cruise on 12/01/2014 for which he has an appointment.  Primary care physician followup in one week.   IMAGING STUDIES: Include a CT scan of the abdomen and pelvis with contrast that showed no acute findings, diverticulosis without diverticulitis.   HISTORY OF PRESENT ILLNESS:  Please see the detailed H and P done by Dr. Lavetta Nielsen.  In brief, a 59 year old male patient brought into the hospital complaining of intractable nausea, vomiting, and dehydration.  The patient was given multiple antiemetic medications in the Emergency Room, was continuing to have vomiting, and admitted to the hospitalist service.    HOSPITAL COURSE:  The patient was started on Reglan scheduled along with Zofran p.r.n. with which he improved well.  By day of discharge, patient had some mild nausea which is a chronic finding but no vomiting.  He has tolerated his diet.  He has an appointment with Dr. Candace Cruise of GI for esophageal ablation 2 days later and is being discharged home to follow up with the GI Clinic.    PHYSICAL EXAMINATION:   CARDIAC:  S1, S2.  LUNGS:  Clear.  ABDOMEN:  Soft, mild tenderness in the epigastric area.   TIME SPENT:  On day of discharge in discharge activity was 40 minutes.     ____________________________ Leia Alf Sanvi Ehler, MD srs:kc D: 11/30/2014 15:06:00 ET T: 11/30/2014 15:28:09 ET JOB#: [id number]  cc: Lupita Dawn. Candace Cruise, MD Perrin Maltese, MD Soren Pigman R. Tuleen Mandelbaum, MD, <Dictator> Neita Carp MD ELECTRONICALLY SIGNED 12/12/2014 11:18

## 2015-01-24 ENCOUNTER — Encounter: Payer: Self-pay | Admitting: *Deleted

## 2015-01-25 ENCOUNTER — Encounter: Payer: Self-pay | Admitting: Emergency Medicine

## 2015-01-25 ENCOUNTER — Emergency Department: Payer: Medicaid Other

## 2015-01-25 ENCOUNTER — Emergency Department
Admission: EM | Admit: 2015-01-25 | Discharge: 2015-01-25 | Disposition: A | Discharge status: Home / Self Care | Payer: Medicaid Other | Attending: Student | Admitting: Student

## 2015-01-25 DIAGNOSIS — R0600 Dyspnea, unspecified: Secondary | ICD-10-CM | POA: Diagnosis not present

## 2015-01-25 DIAGNOSIS — G8929 Other chronic pain: Secondary | ICD-10-CM | POA: Insufficient documentation

## 2015-01-25 DIAGNOSIS — Z7951 Long term (current) use of inhaled steroids: Secondary | ICD-10-CM | POA: Insufficient documentation

## 2015-01-25 DIAGNOSIS — Z79899 Other long term (current) drug therapy: Secondary | ICD-10-CM | POA: Diagnosis not present

## 2015-01-25 DIAGNOSIS — Z87891 Personal history of nicotine dependence: Secondary | ICD-10-CM | POA: Insufficient documentation

## 2015-01-25 DIAGNOSIS — R079 Chest pain, unspecified: Secondary | ICD-10-CM | POA: Diagnosis not present

## 2015-01-25 DIAGNOSIS — R109 Unspecified abdominal pain: Secondary | ICD-10-CM | POA: Insufficient documentation

## 2015-01-25 HISTORY — DX: Migraine, unspecified, not intractable, without status migrainosus: G43.909

## 2015-01-25 HISTORY — DX: Unspecified asthma, uncomplicated: J45.909

## 2015-01-25 LAB — CBC
HEMATOCRIT: 42.4 % (ref 40.0–52.0)
Hemoglobin: 14.6 g/dL (ref 13.0–18.0)
MCH: 31.3 pg (ref 26.0–34.0)
MCHC: 34.4 g/dL (ref 32.0–36.0)
MCV: 90.9 fL (ref 80.0–100.0)
PLATELETS: 234 10*3/uL (ref 150–440)
RBC: 4.67 MIL/uL (ref 4.40–5.90)
RDW: 13.9 % (ref 11.5–14.5)
WBC: 5.8 10*3/uL (ref 3.8–10.6)

## 2015-01-25 LAB — BASIC METABOLIC PANEL
ANION GAP: 9 (ref 5–15)
BUN: 11 mg/dL (ref 6–20)
CHLORIDE: 105 mmol/L (ref 101–111)
CO2: 25 mmol/L (ref 22–32)
Calcium: 9.1 mg/dL (ref 8.9–10.3)
Creatinine, Ser: 0.76 mg/dL (ref 0.61–1.24)
GFR calc Af Amer: >60 mL/min (ref >60)
GFR calc non Af Amer: >60 mL/min (ref >60)
Glucose, Bld: 107 mg/dL — ABNORMAL HIGH (ref 65–99)
POTASSIUM: 4.1 mmol/L (ref 3.5–5.1)
SODIUM: 139 mmol/L (ref 135–145)

## 2015-01-25 LAB — TROPONIN I

## 2015-01-25 MED ORDER — SODIUM CHLORIDE 0.9 % IV BOLUS (SEPSIS)
1000.0000 mL | Freq: Once | INTRAVENOUS | Status: AC
  Administered 2015-01-25: 1000 mL via INTRAVENOUS

## 2015-01-25 MED ORDER — IOHEXOL 350 MG/ML SOLN
100.0000 mL | Freq: Once | INTRAVENOUS | Status: AC | PRN
  Administered 2015-01-25: 100 mL via INTRAVENOUS

## 2015-01-25 MED ORDER — MORPHINE SULFATE 4 MG/ML IJ SOLN
4.0000 mg | Freq: Once | INTRAMUSCULAR | Status: AC
  Administered 2015-01-25: 4 mg via INTRAVENOUS

## 2015-01-25 MED ORDER — MORPHINE SULFATE 4 MG/ML IJ SOLN
INTRAMUSCULAR | Status: AC
  Filled 2015-01-25: qty 1

## 2015-01-25 NOTE — ED Provider Notes (Signed)
[facility] Emergency Department Provider Note  ____________________________________________  Time seen: Approximately 2: 30 PM  I have reviewed the triage vital signs and the nursing notes.   HISTORY  Chief Complaint Chest Pain    HPI Alexander [name] is a 59 y.o. male with history of COPD and recently diagnosed right-sided DVT presents with chest pain and shortness of breath increasing over the last week. Patient says that he was diagnosed with DVT one week ago by his primary care doctor and started on Xarelto.Since his diagnosis he has had increasing left and midsternal chest pain which he describes as a squeezing. It can last up to several minutes and is associated with shortness of breath. The pain is not exertional and the patient doesn't happens when he is at rest. There is no associated nausea or vomiting. He says he has increased pain with deep breaths. There is no radiation of the pain. The patient says that he is compliant with his Xarelto.   Past Medical History  Diagnosis Date  . Mantle cell lymphoma   . COPD (chronic obstructive pulmonary disease)   . Hypertension   . Migraine   . Asthma   . Multiple myeloma     Patient Active Problem List   Diagnosis Date Noted  . DEVIATED SEPTUM 05/18/2007  . ALLERGIC RHINITIS 05/18/2007  . ASTHMA 05/18/2007    Past Surgical History  Procedure Laterality Date  . Throat surgery      Current Outpatient Rx  Name  Route  Sig  Dispense  Refill  . budesonide-formoterol (SYMBICORT) 80-4.5 MCG/ACT inhaler   Inhalation   Inhale 2 puffs into the lungs 2 (two) times daily.         Marland Kitchen dicyclomine (BENTYL) 20 MG tablet   Oral   Take 20 mg by mouth every 6 (six) hours.         . docusate sodium (COLACE) 100 MG capsule   Oral   Take 200 mg by mouth 2 (two) times daily as needed for mild constipation.         . fentaNYL (DURAGESIC - DOSED MCG/HR) 50 MCG/HR   Transdermal   Place 50 mcg onto the skin  every 3 (three) days.         . fluticasone (FLONASE) 50 MCG/ACT nasal spray   Each Nare   Place 2 sprays into both nostrils daily.         Marland Kitchen loperamide (IMODIUM) 2 MG capsule   Oral   Take 2 mg by mouth as needed for diarrhea or loose stools.         . metoprolol succinate (TOPROL-XL) 50 MG 24 hr tablet   Oral   Take 50 mg by mouth daily. Take with or immediately following a meal.         . OLANZapine (ZYPREXA) 5 MG tablet   Oral   Take 2.5 mg by mouth every 6 (six) hours as needed (stomach).         Marland Kitchen omeprazole (PRILOSEC) 20 MG capsule   Oral   Take 20 mg by mouth 2 (two) times daily before a meal.         . ondansetron (ZOFRAN) 8 MG tablet   Oral   Take 8 mg by mouth every 6 (six) hours as needed for nausea or vomiting.         . ondansetron (ZOFRAN-ODT) 4 MG disintegrating tablet   Oral   Take 4 mg by mouth every 8 (  eight) hours as needed for nausea or vomiting.         Marland Kitchen oxycodone (OXY-IR) 5 MG capsule   Oral   Take 5-15 mg by mouth every 4 (four) hours as needed for pain.         Marland Kitchen senna (SENOKOT) 8.6 MG TABS tablet   Oral   Take 2 tablets by mouth 2 (two) times daily as needed for mild constipation.         . sucralfate (CARAFATE) 1 G tablet   Oral   Take 1 g by mouth 4 (four) times daily -  with meals and at bedtime.         . SUMAtriptan (IMITREX) 50 MG tablet   Oral   Take 50 mg by mouth every 2 (two) hours as needed for migraine. May repeat in 2 hours if headache persists or recurs.         . tamsulosin (FLOMAX) 0.4 MG CAPS capsule   Oral   Take 0.4 mg by mouth.         . traZODone (DESYREL) 50 MG tablet   Oral   Take 50 mg by mouth at bedtime.         . urea (CARMOL) 20 % cream   Topical   Apply 1 application topically as needed (feet).           Allergies Ativan  No family history on file.  Social History History  Substance Use Topics  . Smoking status: Former Research scientist (life sciences)  . Smokeless tobacco: Not on file  .  Alcohol Use: Not on file    Review of Systems Constitutional: No fever/chills Eyes: No visual changes. ENT: No sore throat. Cardiovascular: As above Respiratory: As above  Gastrointestinal: Chronic abdominal pain with palpation which is unchanged.  Genitourinary: Negative for dysuria. Musculoskeletal: Negative for back pain. Skin: Negative for rash. Neurological: Negative for headaches, focal weakness or numbness.  10-point ROS otherwise negative.  ____________________________________________   PHYSICAL EXAM:  VITAL SIGNS: ED Triage Vitals  Enc Vitals Group     BP 01/25/15 1352 142/79 mmHg     Pulse Rate 01/25/15 1352 86     Resp 01/25/15 1352 18     Temp 01/25/15 1352 98.8 F (37.1 C)     Temp Source 01/25/15 1352 Oral     SpO2 01/25/15 1352 97 %     Weight 01/25/15 1352 198 lb (89.812 kg)     Height 01/25/15 1352 $RemoveBefor'5\' 10"'TGbBSrwXHTRD$  (1.778 m)     Head Cir --      Peak Flow --      Pain Score 01/25/15 1353 9     Pain Loc --      Pain Edu? --      Excl. in [place]? --     Constitutional: Alert and oriented. Well appearing and in no acute distress. Eyes: Conjunctivae are normal. PERRL. EOMI. Head: Atraumatic. Nose: No congestion/rhinnorhea. Mouth/Throat: Mucous membranes are moist.  Oropharynx non-erythematous. Neck: No stridor.   Cardiovascular: Normal rate, regular rhythm. Grossly normal heart sounds.  Good peripheral circulation. Respiratory: Normal respiratory effort.  No retractions. Lungs CTAB. Gastrointestinal: Soft with mild diffuse tenderness palpation. No distention. Marland Kitchen No CVA tenderness. Musculoskeletal: Mild right lower extremity edema without tenderness palpation.  Neurologic:  Normal speech and language. No gross focal neurologic deficits are appreciated. Speech is normal. No gait instability. Skin:  Skin is warm, dry and intact. No rash noted. Psychiatric: Mood and affect are normal. Speech and behavior  are normal.  ____________________________________________    LABS (all labs ordered are listed, but only abnormal results are displayed)  Labs Reviewed  BASIC METABOLIC PANEL - Abnormal; Notable for the following:    Glucose, Bld 107 (*)    All other components within normal limits  CBC  TROPONIN I   ____________________________________________  EKG  ED ECG REPORT I, Jester Klingberg,  Youlanda Roys, the attending physician, personally viewed and interpreted this ECG.   Date: 01/25/2015  EKG Time: 1402  Rate: 82  Rhythm: normal sinus rhythm  Axis: Normal axis  Intervals: RSR prime in V1 suggesting right ventricular conduction delay  ST&T Change: No abnormal T-wave inversions.  Isolated 1 mm ST elevation in V2 with concave morphology. Likely benign  ____________________________________________  RADIOLOGY  Pending CT of the chest. ____________________________________________   PROCEDURES    ____________________________________________   INITIAL IMPRESSION / ASSESSMENT AND PLAN / ED COURSE  Pertinent labs & imaging results that were available during my care of the patient were reviewed by me and considered in my medical decision making (see chart for details).  ----------------------------------------- 3:38 PM on 01/25/2015 -----------------------------------------  Patient resting comfortably. Labs reassuring as well as vital signs. However given patient on Xarelto with no DVT will check CT angios of the chest to make sure the patient does not have pulmonary embolus and also to evaluate the clot burden. Dr. Edd Fabian follow up with imaging and disposition accordingly. ____________________________________________   FINAL CLINICAL IMPRESSION(S) / ED DIAGNOSES  Acute chest pain and shortness of breath with known DVT. Compliant with Xarelto. Initial visit.    Orbie Pyo, MD 01/25/15 [phone]

## 2015-01-25 NOTE — ED Notes (Signed)
Chest pain and difficulty breathing x1 week , was seen at PCP on Monday dx of DVT in right leg , started on blood thinner , symptoms worsening today , along with diarrhea and vomiting

## 2015-01-25 NOTE — ED Provider Notes (Signed)
-----------------------------------------   5:02 PM on 01/25/2015 -----------------------------------------  CTA negative for any acute intrathoracic process. Vital signs remained stable. The patient is not hypoxic, not tachypneic. No chest pain. Not c/w ACS.  Discussed need for close PCP follow-up. He'll call Dr. Chancy Milroy in the morning. We discussed meticulous immediate return precautions and he and wife at bedside are comfortable with discharge plan.  Joanne Gavel, MD 01/25/15 [phone]

## 2015-01-25 NOTE — ED Notes (Signed)
Pt reports dvt dx 2 days ago and started on blood thinner.  States today he feels sob and having sob.  Pt in no resp distress , NSR on monitor, skin w/d.

## 2015-01-25 NOTE — ED Notes (Signed)
Pt transported to CT ?

## 2015-01-26 ENCOUNTER — Encounter: Admission: RE | Payer: Self-pay | Source: Ambulatory Visit

## 2015-01-26 ENCOUNTER — Ambulatory Visit: Admission: RE | Admit: 2015-01-26 | Payer: Medicaid Other | Source: Ambulatory Visit | Admitting: Gastroenterology

## 2015-01-26 HISTORY — DX: Mantle cell lymphoma, unspecified site: C83.10

## 2015-01-26 HISTORY — DX: Chronic obstructive pulmonary disease, unspecified: J44.9

## 2015-01-26 SURGERY — EGD (ESOPHAGOGASTRODUODENOSCOPY)
Anesthesia: General

## 2015-02-23 ENCOUNTER — Ambulatory Visit: Payer: Medicaid Other | Admitting: Anesthesiology

## 2015-02-23 ENCOUNTER — Encounter
Admission: RE | Disposition: A | Discharge status: Home / Self Care | Payer: Self-pay | Source: Ambulatory Visit | Attending: Gastroenterology

## 2015-02-23 ENCOUNTER — Encounter: Payer: Self-pay | Admitting: Anesthesiology

## 2015-02-23 ENCOUNTER — Ambulatory Visit
Admission: RE | Admit: 2015-02-23 | Discharge: 2015-02-23 | Disposition: A | Discharge status: Home / Self Care | Payer: Medicaid Other | Source: Ambulatory Visit | Attending: Gastroenterology | Admitting: Gastroenterology

## 2015-02-23 DIAGNOSIS — I252 Old myocardial infarction: Secondary | ICD-10-CM | POA: Diagnosis not present

## 2015-02-23 DIAGNOSIS — Z87891 Personal history of nicotine dependence: Secondary | ICD-10-CM | POA: Diagnosis not present

## 2015-02-23 DIAGNOSIS — I10 Essential (primary) hypertension: Secondary | ICD-10-CM | POA: Diagnosis not present

## 2015-02-23 DIAGNOSIS — C9000 Multiple myeloma not having achieved remission: Secondary | ICD-10-CM | POA: Insufficient documentation

## 2015-02-23 DIAGNOSIS — J45909 Unspecified asthma, uncomplicated: Secondary | ICD-10-CM | POA: Diagnosis not present

## 2015-02-23 DIAGNOSIS — K22710 Barrett's esophagus with low grade dysplasia: Secondary | ICD-10-CM | POA: Diagnosis not present

## 2015-02-23 DIAGNOSIS — K2270 Barrett's esophagus without dysplasia: Secondary | ICD-10-CM | POA: Diagnosis present

## 2015-02-23 DIAGNOSIS — Z8572 Personal history of non-Hodgkin lymphomas: Secondary | ICD-10-CM | POA: Insufficient documentation

## 2015-02-23 DIAGNOSIS — Z7902 Long term (current) use of antithrombotics/antiplatelets: Secondary | ICD-10-CM | POA: Insufficient documentation

## 2015-02-23 DIAGNOSIS — G43909 Migraine, unspecified, not intractable, without status migrainosus: Secondary | ICD-10-CM | POA: Diagnosis not present

## 2015-02-23 DIAGNOSIS — J449 Chronic obstructive pulmonary disease, unspecified: Secondary | ICD-10-CM | POA: Diagnosis not present

## 2015-02-23 DIAGNOSIS — Z888 Allergy status to other drugs, medicaments and biological substances status: Secondary | ICD-10-CM | POA: Insufficient documentation

## 2015-02-23 HISTORY — PX: ESOPHAGOGASTRODUODENOSCOPY (EGD) WITH PROPOFOL: SHX5813

## 2015-02-23 SURGERY — ESOPHAGOGASTRODUODENOSCOPY (EGD) WITH PROPOFOL
Anesthesia: General

## 2015-02-23 MED ORDER — LIDOCAINE HCL (CARDIAC) 20 MG/ML IV SOLN
INTRAVENOUS | Status: DC | PRN
  Administered 2015-02-23: 60 mg via INTRAVENOUS

## 2015-02-23 MED ORDER — SODIUM CHLORIDE 0.9 % IV SOLN
INTRAVENOUS | Status: DC
  Administered 2015-02-23: 1000 mL via INTRAVENOUS
  Administered 2015-02-23: 10:00:00 via INTRAVENOUS

## 2015-02-23 MED ORDER — PROPOFOL INFUSION 10 MG/ML OPTIME
INTRAVENOUS | Status: DC | PRN
  Administered 2015-02-23: 140 ug/kg/min via INTRAVENOUS

## 2015-02-23 MED ORDER — GLYCOPYRROLATE 0.2 MG/ML IJ SOLN
INTRAMUSCULAR | Status: DC | PRN
Start: 2015-02-23 — End: 2015-02-23
  Administered 2015-02-23: 0.2 mg via INTRAVENOUS

## 2015-02-23 MED ORDER — MIDAZOLAM HCL 2 MG/2ML IJ SOLN
INTRAMUSCULAR | Status: DC | PRN
  Administered 2015-02-23: 2 mg via INTRAVENOUS

## 2015-02-23 MED ORDER — EPHEDRINE SULFATE 50 MG/ML IJ SOLN
INTRAMUSCULAR | Status: DC | PRN
  Administered 2015-02-23: 5 mg via INTRAVENOUS

## 2015-02-23 MED ORDER — STERILE WATER FOR INJECTION IJ SOLN
Freq: Once | RESPIRATORY_TRACT | Status: AC
  Administered 2015-02-23: 10:00:00 via OROMUCOSAL
  Filled 2015-02-23: qty 3

## 2015-02-23 MED ORDER — OXYCODONE HCL 5 MG PO TABS: 10.0000 mg | ORAL_TABLET | ORAL | Status: DC | PRN

## 2015-02-23 MED ORDER — PROPOFOL 10 MG/ML IV BOLUS
INTRAVENOUS | Status: DC | PRN
  Administered 2015-02-23: 50 mg via INTRAVENOUS

## 2015-02-23 NOTE — H&P (Signed)
Primary Care Physician:  Perrin Maltese, MD Primary Gastroenterologist:  Dr. Candace Cruise  Pre-Procedure History & Physical: HPI:  Alexander [name] is a 59 y.o. male is here for an EGD with Barrx.   Past Medical History  Diagnosis Date  . Mantle cell lymphoma   . COPD (chronic obstructive pulmonary disease)   . Hypertension   . Migraine   . Asthma   . Multiple myeloma     Past Surgical History  Procedure Laterality Date  . Throat surgery    . Barryx N/A     Prior to Admission medications   Medication Sig Start Date End Date Taking? Authorizing Provider  budesonide-formoterol (SYMBICORT) 80-4.5 MCG/ACT inhaler Inhale 2 puffs into the lungs 2 (two) times daily as needed (for shortness of breath).    Yes Historical Provider, MD  dicyclomine (BENTYL) 20 MG tablet Take 20 mg by mouth every 6 (six) hours as needed for spasms.    Yes Historical Provider, MD  docusate sodium (COLACE) 100 MG capsule Take 200 mg by mouth 2 (two) times daily as needed for mild constipation.   Yes Historical Provider, MD  fluticasone (FLONASE) 50 MCG/ACT nasal spray Place 2 sprays into both nostrils daily as needed for rhinitis.    Yes Historical Provider, MD  loperamide (IMODIUM) 2 MG capsule Take 2 mg by mouth as needed for diarrhea or loose stools.   Yes Historical Provider, MD  OLANZapine (ZYPREXA) 5 MG tablet Take 2.5 mg by mouth every 6 (six) hours as needed (for GI upset).    Yes Historical Provider, MD  omeprazole (PRILOSEC) 20 MG capsule Take 20 mg by mouth 2 (two) times daily.    Yes Historical Provider, MD  ondansetron (ZOFRAN) 8 MG tablet Take 8 mg by mouth every 6 (six) hours as needed for nausea or vomiting.   Yes Historical Provider, MD  oxyCODONE (OXY IR/ROXICODONE) 5 MG immediate release tablet Take 10 mg by mouth every 4 (four) hours as needed for severe pain.   Yes Historical Provider, MD  Rivaroxaban (XARELTO) 15 MG TABS tablet Take 15 mg by mouth 2 (two) times daily.   Yes Historical Provider, MD   senna (SENOKOT) 8.6 MG TABS tablet Take 2 tablets by mouth 2 (two) times daily as needed for mild constipation.   Yes Historical Provider, MD  sucralfate (CARAFATE) 1 G tablet Take 1 g by mouth 3 (three) times daily as needed (for GI upset).    Yes Historical Provider, MD  SUMAtriptan (IMITREX) 50 MG tablet Take 50 mg by mouth every 2 (two) hours as needed for migraine.    Yes Historical Provider, MD  traZODone (DESYREL) 50 MG tablet Take 50 mg by mouth at bedtime.   Yes Historical Provider, MD  urea (CARMOL) 20 % cream Apply 1 application topically as needed (for dry skin on feet).    Yes Historical Provider, MD  fentaNYL (DURAGESIC - DOSED MCG/HR) 50 MCG/HR Place 50 mcg onto the skin every 3 (three) days.    Historical Provider, MD  ondansetron (ZOFRAN-ODT) 4 MG disintegrating tablet Take 4 mg by mouth every 8 (eight) hours as needed for nausea or vomiting.    Historical Provider, MD    Allergies as of 02/21/2015 - Review Complete 01/25/2015  Allergen Reaction Noted  . Ativan [lorazepam] Other (See Comments) 01/12/2015    History reviewed. No pertinent family history.  History   Social History  . Marital Status: Married    Spouse Name: N/A  . Number  of Children: N/A  . Years of Education: N/A   Occupational History  . Not on file.   Social History Main Topics  . Smoking status: Former Research scientist (life sciences)  . Smokeless tobacco: Not on file  . Alcohol Use: Not on file  . Drug Use: Not on file  . Sexual Activity: Not on file   Other Topics Concern  . Not on file   Social History Narrative    Review of Systems: See HPI, otherwise negative ROS  Physical Exam: There were no vitals taken for this visit. General:   Alert,  pleasant and cooperative in NAD Head:  Normocephalic and atraumatic. Neck:  Supple; no masses or thyromegaly. Lungs:  Clear throughout to auscultation.    Heart:  Regular rate and rhythm. Abdomen:  Soft, nontender and nondistended. Normal bowel sounds, without  guarding, and without rebound.   Neurologic:  Alert and  oriented x4;  grossly normal neurologically.  Impression/Plan: [name] is here for an EGD with Barrx to be performed for high grade dysplasia. Risks, benefits, limitations, and alternatives regarding EGD with Barrx have been reviewed with the patient.  Questions have been answered.  All parties agreeable.   Katalyn Matin, Lupita Dawn, MD  02/23/2015, 8:05 AM

## 2015-02-23 NOTE — Anesthesia Postprocedure Evaluation (Signed)
  Anesthesia Post-op Note  Patient: Alexander [name]  Procedure(s) Performed: Procedure(s): ESOPHAGOGASTRODUODENOSCOPY (EGD) WITH PROPOFOL (N/A)  Anesthesia type:General  Patient location: PACU  Post pain: Pain level controlled  Post assessment: Post-op Vital signs reviewed, Patient's Cardiovascular Status Stable, Respiratory Function Stable, Patent Airway and No signs of Nausea or vomiting  Post vital signs: Reviewed and stable  Last Vitals:  Filed Vitals:   02/23/15 1017  BP: 116/52  Pulse: 67  Temp: 36.1 C  Resp: 13    Level of consciousness: awake, alert  and patient cooperative  Complications: No apparent anesthesia complications

## 2015-02-23 NOTE — Op Note (Signed)
[facility] Gastroenterology Patient Name: Alexander [name] Procedure Date: 02/23/2015 9:50 AM MRN: [id number] Account #: [id number] Date of Birth: 03-30-1956 Admit Type: Outpatient Age: 59 Room: [facility] ENDO ROOM 4 Gender: Male Note Status: Finalized Procedure:         Upper GI endoscopy Indications:       For therapy of Barrett's esophagus, Hx of high grade                     dysplasia that improved to low grade. Off xarelto x 4                     days. S/P Barrx x 2. Providers:         Lupita Dawn. 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 S/P                     Barrx x 2. Monitored Anesthesia Care Complications:     No immediate complications. Procedure:         Pre-Anesthesia Assessment:                    - Prior to the procedure, a History and Physical was                     performed, and patient medications, allergies and                     sensitivities were reviewed. The patient's tolerance of                     previous anesthesia was reviewed.                    - The risks and benefits of the procedure and the sedation                     options and risks were discussed with the patient. All                     questions were answered and informed consent was obtained.                    - After reviewing the risks and benefits, the patient was                     deemed in satisfactory condition to undergo the procedure.                    After obtaining informed consent, the endoscope was passed                     under direct vision. Throughout the procedure, the                     patient's blood pressure, pulse, and oxygen saturations                     were monitored continuously. The Endoscope was introduced                     through the mouth, and advanced to the second part of  duodenum. The upper GI endoscopy was accomplished  without                     difficulty. The patient tolerated the procedure well. Findings:      The esophagus and gastroesophageal junction were examined with white       light and narrow band imaging (NBI) from a forward view and retroflexed       position. There were esophageal mucosal changes consistent with       short-segment Barrett's esophagus, extending from the upper extent of       the gastric folds which were at 37 cm from the incisors to the Z-line       which was at 35 cm from the incisors. One tongue of salmon-colored       mucosa was present from 36 to 37 cm and squamous islands were present       from 35 to 36 cm. The maximum longitudinal extent of these esophageal       mucosal changes was 2 cm in length. Focal radiofrequency ablation of       Barrett's esophagus was performed. With the endoscope in place, the       position and extent of the Barrett's mucosa and the anatomic landmarks       including proximal and distal extent of Barrett's mucosa were noted. The       Barrett's mucosa was irrigated with N-acetylcysteine (Mucomyst) 1% mixed       with water. Gastric and esophageal contents were suctioned. The       endoscope was then removed from the patient. The Halo-90 radiofrequency       ablation catheter was attached to the tip of the endoscope. The       endoscope with the attached radiofrequency ablation catheter was then       passed transorally under direct vision into the esophagus and advanced       to the areas of Barrett's mucosa. The areas included islands and tongues       of Barrett's mucosa. The radiofrequency ablation catheter was placed in       contact with the surface of the Barrett's mucosa under direct       visualization and energy was applied twice at 12 J/cm2. Ablation was       repeated in a likewise fashion to the entire area of suspected Barrett's       mucosa. The ablation zone was cleaned of coagulative debris. The       ablation catheter  and endoscope were then removed and the catheter was       cleaned. The catheter and endoscope were reinserted into the esophagus.       A second round of ablation was then performed. Energy was applied twice       at 12 J/cm2 to retreat the areas of Barrett's epithelium that had been       treated with the first series of ablation. The areas of the esophagus       where Barrett's mucosa had been ablated were carefully examined. Areas       of Barrett's esophagus were completely treated. Total of 24 treatments       given.      The entire examined stomach was normal.      The examined duodenum was normal. Impression:        - Esophageal mucosal changes consistent with short-segment  Barrett's esophagus. Treated with radiofrequency ablation.                    - Normal stomach.                    - Normal examined duodenum.                    - No specimens collected. Recommendation:    - Discharge patient to home.                    - Observe patient's clinical course.                    - Continue present medications.                    - The findings and recommendations were discussed with the                     patient.                    - GI cocktail and tylenol with codeine elixir for pain                     control. Resume xarelto in 3 days. Procedure Code(s): --- Professional ---                    [phone], Esophagogastroduodenoscopy, flexible, transoral;                     with ablation of tumor(s), polyp(s), or other lesion(s)                     (includes pre- and post-dilation and guide wire passage,                     when performed) Diagnosis Code(s): --- Professional ---                    K22.70, Barrett's esophagus without dysplasia CPT copyright 2014 American Medical Association. All rights reserved. The codes documented in this report are preliminary and upon coder review may  be revised to meet current compliance requirements. Hulen Luster,  MD 02/23/2015 10:17:55 AM This report has been signed electronically. Number of Addenda: 0 Note Initiated On: 02/23/2015 9:50 AM      [facility]

## 2015-02-23 NOTE — Anesthesia Preprocedure Evaluation (Addendum)
Anesthesia Evaluation  Patient identified by MRN, date of birth, ID band Patient awake    Reviewed: Allergy & Precautions, H&P , NPO status , Patient's Chart, lab work & pertinent test results, reviewed documented beta blocker date and time   Airway Mallampati: II  TM Distance: >3 FB Neck ROM: full    Dental  (+) Upper Dentures, Lower Dentures   Pulmonary asthma , COPDformer smoker,  breath sounds clear to auscultation  Pulmonary exam normal       Cardiovascular hypertension, On Medications - Past MI Normal cardiovascular examRhythm:regular Rate:Normal     Neuro/Psych  Headaches, negative psych ROS   GI/Hepatic negative GI ROS, Neg liver ROS,   Endo/Other  negative endocrine ROS  Renal/GU negative Renal ROS  negative genitourinary   Musculoskeletal   Abdominal   Peds  Hematology negative hematology ROS (+)   Anesthesia Other Findings   Reproductive/Obstetrics negative OB ROS                            Anesthesia Physical Anesthesia Plan  ASA: III  Anesthesia Plan: General   Post-op Pain Management:    Induction:   Airway Management Planned:   Additional Equipment:   Intra-op Plan:   Post-operative Plan:   Informed Consent: I have reviewed the patients History and Physical, chart, labs and discussed the procedure including the risks, benefits and alternatives for the proposed anesthesia with the patient or authorized representative who has indicated his/her understanding and acceptance.   Dental Advisory Given  Plan Discussed with: Anesthesiologist, CRNA and Surgeon  Anesthesia Plan Comments:         Anesthesia Quick Evaluation

## 2015-02-23 NOTE — Transfer of Care (Signed)
Immediate Anesthesia Transfer of Care Note  Patient: Alexander [name]  Procedure(s) Performed: Procedure(s): ESOPHAGOGASTRODUODENOSCOPY (EGD) WITH PROPOFOL (N/A)  Patient Location: Endoscopy Unit  Anesthesia Type:General  Level of Consciousness: awake, alert , oriented and patient cooperative  Airway & Oxygen Therapy: Patient Spontanous Breathing and Patient connected to nasal cannula oxygen  Post-op Assessment: Report given to RN, Post -op Vital signs reviewed and stable and Patient moving all extremities X 4  Post vital signs: Reviewed and stable  Last Vitals:  Filed Vitals:   02/23/15 1017  BP: 116/52  Pulse: 67  Temp: 36.1 C  Resp: 13    Complications: No apparent anesthesia complications

## 2015-02-24 ENCOUNTER — Encounter: Payer: Self-pay | Admitting: Gastroenterology

## 2015-04-25 ENCOUNTER — Other Ambulatory Visit: Payer: Self-pay

## 2015-04-25 ENCOUNTER — Encounter: Payer: Self-pay | Admitting: Emergency Medicine

## 2015-04-25 ENCOUNTER — Emergency Department
Admission: EM | Admit: 2015-04-25 | Discharge: 2015-04-26 | Disposition: A | Discharge status: Home / Self Care | Payer: Medicaid Other | Source: Home / Self Care | Attending: Emergency Medicine | Admitting: Emergency Medicine

## 2015-04-25 ENCOUNTER — Emergency Department
Admission: EM | Admit: 2015-04-25 | Discharge: 2015-04-25 | Disposition: A | Discharge status: Home / Self Care | Payer: Medicaid Other | Attending: Emergency Medicine | Admitting: Emergency Medicine

## 2015-04-25 ENCOUNTER — Emergency Department: Payer: Medicaid Other

## 2015-04-25 DIAGNOSIS — I10 Essential (primary) hypertension: Secondary | ICD-10-CM | POA: Insufficient documentation

## 2015-04-25 DIAGNOSIS — Z79899 Other long term (current) drug therapy: Secondary | ICD-10-CM | POA: Insufficient documentation

## 2015-04-25 DIAGNOSIS — R112 Nausea with vomiting, unspecified: Secondary | ICD-10-CM | POA: Diagnosis not present

## 2015-04-25 DIAGNOSIS — R1084 Generalized abdominal pain: Secondary | ICD-10-CM | POA: Insufficient documentation

## 2015-04-25 DIAGNOSIS — R590 Localized enlarged lymph nodes: Secondary | ICD-10-CM | POA: Insufficient documentation

## 2015-04-25 DIAGNOSIS — Z87891 Personal history of nicotine dependence: Secondary | ICD-10-CM | POA: Insufficient documentation

## 2015-04-25 DIAGNOSIS — R197 Diarrhea, unspecified: Secondary | ICD-10-CM | POA: Insufficient documentation

## 2015-04-25 DIAGNOSIS — R1110 Vomiting, unspecified: Secondary | ICD-10-CM

## 2015-04-25 DIAGNOSIS — R1032 Left lower quadrant pain: Secondary | ICD-10-CM | POA: Diagnosis present

## 2015-04-25 DIAGNOSIS — R591 Generalized enlarged lymph nodes: Secondary | ICD-10-CM

## 2015-04-25 LAB — COMPREHENSIVE METABOLIC PANEL
ALT: 13 U/L — ABNORMAL LOW (ref 17–63)
AST: 27 U/L (ref 15–41)
Albumin: 4.2 g/dL (ref 3.5–5.0)
Alkaline Phosphatase: 91 U/L (ref 38–126)
Anion gap: 6 (ref 5–15)
BUN: 12 mg/dL (ref 6–20)
CALCIUM: 8.6 mg/dL — AB (ref 8.9–10.3)
CO2: 28 mmol/L (ref 22–32)
Chloride: 106 mmol/L (ref 101–111)
Creatinine, Ser: 1.13 mg/dL (ref 0.61–1.24)
GFR calc non Af Amer: >60 mL/min (ref >60)
Glucose, Bld: 130 mg/dL — ABNORMAL HIGH (ref 65–99)
Potassium: 4.1 mmol/L (ref 3.5–5.1)
SODIUM: 140 mmol/L (ref 135–145)
Total Bilirubin: 0.6 mg/dL (ref 0.3–1.2)
Total Protein: 7 g/dL (ref 6.5–8.1)

## 2015-04-25 LAB — URINALYSIS COMPLETE WITH MICROSCOPIC (ARMC ONLY)
Bacteria, UA: NONE SEEN
Bilirubin Urine: NEGATIVE
Glucose, UA: NEGATIVE mg/dL
Hgb urine dipstick: NEGATIVE
KETONES UR: NEGATIVE mg/dL
LEUKOCYTES UA: NEGATIVE
Nitrite: NEGATIVE
PH: 7 (ref 5.0–8.0)
PROTEIN: NEGATIVE mg/dL
RBC / HPF: NONE SEEN RBC/hpf (ref 0–5)
SPECIFIC GRAVITY, URINE: 1.014 (ref 1.005–1.030)
Squamous Epithelial / LPF: NONE SEEN
WBC UA: NONE SEEN WBC/hpf (ref 0–5)

## 2015-04-25 LAB — CBC
HCT: 38 % — ABNORMAL LOW (ref 40.0–52.0)
Hemoglobin: 13.2 g/dL (ref 13.0–18.0)
MCH: 30.7 pg (ref 26.0–34.0)
MCHC: 34.6 g/dL (ref 32.0–36.0)
MCV: 88.7 fL (ref 80.0–100.0)
Platelets: 182 10*3/uL (ref 150–440)
RBC: 4.29 MIL/uL — AB (ref 4.40–5.90)
RDW: 13.1 % (ref 11.5–14.5)
WBC: 6.9 10*3/uL (ref 3.8–10.6)

## 2015-04-25 LAB — LIPASE, BLOOD: LIPASE: 17 U/L — AB (ref 22–51)

## 2015-04-25 MED ORDER — PROMETHAZINE HCL 25 MG/ML IJ SOLN
25.0000 mg | Freq: Once | INTRAMUSCULAR | Status: DC
  Filled 2015-04-25: qty 1

## 2015-04-25 MED ORDER — IOHEXOL 240 MG/ML SOLN
25.0000 mL | Freq: Once | INTRAMUSCULAR | Status: AC | PRN
  Administered 2015-04-25: 25 mL via ORAL

## 2015-04-25 MED ORDER — ONDANSETRON HCL 4 MG/2ML IJ SOLN
4.0000 mg | Freq: Once | INTRAMUSCULAR | Status: AC
  Administered 2015-04-25: 4 mg via INTRAVENOUS
  Filled 2015-04-25: qty 2

## 2015-04-25 MED ORDER — HYDROMORPHONE HCL 1 MG/ML IJ SOLN
1.0000 mg | Freq: Once | INTRAMUSCULAR | Status: AC
  Administered 2015-04-25: 1 mg via INTRAVENOUS
  Filled 2015-04-25: qty 1

## 2015-04-25 MED ORDER — SODIUM CHLORIDE 0.9 % IV BOLUS (SEPSIS)
1000.0000 mL | Freq: Once | INTRAVENOUS | Status: AC
  Administered 2015-04-26: 1000 mL via INTRAVENOUS

## 2015-04-25 MED ORDER — SUCRALFATE 1 G PO TABS
1.0000 g | ORAL_TABLET | Freq: Once | ORAL | Status: AC
  Administered 2015-04-25: 1 g via ORAL
  Filled 2015-04-25: qty 1

## 2015-04-25 MED ORDER — PROMETHAZINE HCL 25 MG RE SUPP
25.0000 mg | Freq: Four times a day (QID) | RECTAL | Status: DC | PRN
Start: 2015-04-25 — End: 2015-09-24

## 2015-04-25 MED ORDER — PROMETHAZINE HCL 25 MG/ML IJ SOLN
25.0000 mg | Freq: Once | INTRAMUSCULAR | Status: AC
  Administered 2015-04-25: 25 mg via INTRAVENOUS

## 2015-04-25 MED ORDER — SODIUM CHLORIDE 0.9 % IV SOLN
8.0000 mg | Freq: Once | INTRAVENOUS | Status: AC
  Administered 2015-04-26: 8 mg via INTRAVENOUS

## 2015-04-25 MED ORDER — IOHEXOL 300 MG/ML  SOLN
100.0000 mL | Freq: Once | INTRAMUSCULAR | Status: AC | PRN
  Administered 2015-04-25: 100 mL via INTRAVENOUS

## 2015-04-25 MED ORDER — SODIUM CHLORIDE 0.9 % IV BOLUS (SEPSIS)
1000.0000 mL | Freq: Once | INTRAVENOUS | Status: AC
  Administered 2015-04-25: 1000 mL via INTRAVENOUS

## 2015-04-25 MED ORDER — MORPHINE SULFATE (PF) 2 MG/ML IV SOLN
2.0000 mg | Freq: Once | INTRAVENOUS | Status: AC
  Administered 2015-04-26: 2 mg via INTRAVENOUS
  Filled 2015-04-25: qty 1

## 2015-04-25 MED ORDER — GI COCKTAIL ~~LOC~~
30.0000 mL | Freq: Once | ORAL | Status: AC
  Administered 2015-04-25: 30 mL via ORAL
  Filled 2015-04-25: qty 30

## 2015-04-25 NOTE — ED Notes (Addendum)
Pt uprite on stretcher in exam room, restless; pt was seen here last night for N/V several days; st was sent home with phenergan suppositories which did not help and now he is having diarrhea and generalized HA; pt with hx lymphoma, chemo recently stopped by his physician; +BS, abd soft/nondist, reports generalized discomfort of abdomen with palpation

## 2015-04-25 NOTE — Discharge Instructions (Signed)
Abdominal Pain °Many things can cause abdominal pain. Usually, abdominal pain is not caused by a disease and will improve without treatment. It can often be observed and treated at home. Your health care provider will do a physical exam and possibly order blood tests and X-rays to help determine the seriousness of your pain. However, in many cases, more time must pass before a clear cause of the pain can be found. Before that point, your health care provider may not know if you need more testing or further treatment. °HOME CARE INSTRUCTIONS  °Monitor your abdominal pain for any changes. The following actions may help to alleviate any discomfort you are experiencing: °· Only take over-the-counter or prescription medicines as directed by your health care provider. °· Do not take laxatives unless directed to do so by your health care provider. °· Try a clear liquid diet (broth, tea, or water) as directed by your health care provider. Slowly move to a bland diet as tolerated. °SEEK MEDICAL CARE IF: °· You have unexplained abdominal pain. °· You have abdominal pain associated with nausea or diarrhea. °· You have pain when you urinate or have a bowel movement. °· You experience abdominal pain that wakes you in the night. °· You have abdominal pain that is worsened or improved by eating food. °· You have abdominal pain that is worsened with eating fatty foods. °· You have a fever. °SEEK IMMEDIATE MEDICAL CARE IF:  °· Your pain does not go away within 2 hours. °· You keep throwing up (vomiting). °· Your pain is felt only in portions of the abdomen, such as the right side or the left lower portion of the abdomen. °· You pass bloody or black tarry stools. °MAKE SURE YOU: °· Understand these instructions.   °· Will watch your condition.   °· Will get help right away if you are not doing well or get worse.   °Document Released: 05/15/2005 Document Revised: 08/10/2013 Document Reviewed: 04/14/2013 °[employer]® Patient Information  ©2015 [employer]. This information is not intended to replace advice given to you by your health care provider. Make sure you discuss any questions you have with your health care provider. ° °Nausea and Vomiting °Nausea is a sick feeling that often comes before throwing up (vomiting). Vomiting is a reflex where stomach contents come out of your mouth. Vomiting can cause severe loss of body fluids (dehydration). Children and elderly adults can become dehydrated quickly, especially if they also have diarrhea. Nausea and vomiting are symptoms of a condition or disease. It is important to find the cause of your symptoms. °CAUSES  °· Direct irritation of the stomach lining. This irritation can result from increased acid production (gastroesophageal reflux disease), infection, food poisoning, taking certain medicines (such as nonsteroidal anti-inflammatory drugs), alcohol use, or tobacco use. °· Signals from the brain. These signals could be caused by a headache, heat exposure, an inner ear disturbance, increased pressure in the brain from injury, infection, a tumor, or a concussion, pain, emotional stimulus, or metabolic problems. °· An obstruction in the gastrointestinal tract (bowel obstruction). °· Illnesses such as diabetes, hepatitis, gallbladder problems, appendicitis, kidney problems, cancer, sepsis, atypical symptoms of a heart attack, or eating disorders. °· Medical treatments such as chemotherapy and radiation. °· Receiving medicine that makes you sleep (general anesthetic) during surgery. °DIAGNOSIS °Your caregiver may ask for tests to be done if the problems do not improve after a few days. Tests may also be done if symptoms are severe or if the reason for the nausea   and vomiting is not clear. Tests may include: °· Urine tests. °· Blood tests. °· Stool tests. °· Cultures (to look for evidence of infection). °· X-rays or other imaging studies. °Test results can help your caregiver make decisions about  treatment or the need for additional tests. °TREATMENT °You need to stay well hydrated. Drink frequently but in small amounts. You may wish to drink water, sports drinks, clear broth, or eat frozen ice pops or gelatin dessert to help stay hydrated. When you eat, eating slowly may help prevent nausea. There are also some antinausea medicines that may help prevent nausea. °HOME CARE INSTRUCTIONS  °· Take all medicine as directed by your caregiver. °· If you do not have an appetite, do not force yourself to eat. However, you must continue to drink fluids. °· If you have an appetite, eat a normal diet unless your caregiver tells you differently. °¨ Eat a variety of complex carbohydrates (rice, wheat, potatoes, bread), lean meats, yogurt, fruits, and vegetables. °¨ Avoid high-fat foods because they are more difficult to digest. °· Drink enough water and fluids to keep your urine clear or pale yellow. °· If you are dehydrated, ask your caregiver for specific rehydration instructions. Signs of dehydration may include: °¨ Severe thirst. °¨ Dry lips and mouth. °¨ Dizziness. °¨ Dark urine. °¨ Decreasing urine frequency and amount. °¨ Confusion. °¨ Rapid breathing or pulse. °SEEK IMMEDIATE MEDICAL CARE IF:  °· You have blood or brown flecks (like coffee grounds) in your vomit. °· You have black or bloody stools. °· You have a severe headache or stiff neck. °· You are confused. °· You have severe abdominal pain. °· You have chest pain or trouble breathing. °· You do not urinate at least once every 8 hours. °· You develop cold or clammy skin. °· You continue to vomit for longer than 24 to 48 hours. °· You have a fever. °MAKE SURE YOU:  °· Understand these instructions. °· Will watch your condition. °· Will get help right away if you are not doing well or get worse. °Document Released: 08/05/2005 Document Revised: 10/28/2011 Document Reviewed: 01/02/2011 °[employer]® Patient Information ©2015 [employer]. This information is not  intended to replace advice given to you by your health care provider. Make sure you discuss any questions you have with your health care provider. ° °

## 2015-04-25 NOTE — ED Notes (Signed)
Pt to triage via w/c with no distress noted; pt c/o lower abd pain accomp by N/V x 2-3days; pt with hx of lymphoma; last chemo was canelled by MD "because he said nothing else they could do"

## 2015-04-25 NOTE — ED Provider Notes (Signed)
[facility] Emergency Department Provider Note  ____________________________________________  Time seen: 11:45 PM  I have reviewed the triage vital signs and the nursing notes.   HISTORY  Chief Complaint Emesis and Diarrhea      HPI Alexander [name] is a 59 y.o. male presents with continued nausea vomiting and diarrhea. Patient seen in the emergency department last night and prescribed Phenergan suppositories however nausea and vomitus persisted. Patient states last nonbloody emesis was at 9:30 PM. She denies any fever admits to generalized abdominal pain.     Past Medical History  Diagnosis Date  . Mantle cell lymphoma   . COPD (chronic obstructive pulmonary disease)   . Hypertension   . Migraine   . Asthma     Patient Active Problem List   Diagnosis Date Noted  . DEVIATED SEPTUM 05/18/2007  . ALLERGIC RHINITIS 05/18/2007  . ASTHMA 05/18/2007    Past Surgical History  Procedure Laterality Date  . Throat surgery    . Barryx N/A   . Esophagogastroduodenoscopy (egd) with propofol N/A 02/23/2015    Procedure: ESOPHAGOGASTRODUODENOSCOPY (EGD) WITH PROPOFOL;  Surgeon: Hulen Luster, MD;  Location: [facility] ENDOSCOPY;  Service: Gastroenterology;  Laterality: N/A;    Current Outpatient Rx  Name  Route  Sig  Dispense  Refill  . budesonide-formoterol (SYMBICORT) 80-4.5 MCG/ACT inhaler   Inhalation   Inhale 2 puffs into the lungs 2 (two) times daily as needed (for shortness of breath).          . dicyclomine (BENTYL) 20 MG tablet   Oral   Take 20 mg by mouth every 6 (six) hours as needed for spasms.          Marland Kitchen docusate sodium (COLACE) 100 MG capsule   Oral   Take 200 mg by mouth 2 (two) times daily as needed for mild constipation.         . fentaNYL (DURAGESIC - DOSED MCG/HR) 50 MCG/HR   Transdermal   Place 50 mcg onto the skin every 3 (three) days.         . fluticasone (FLONASE) 50 MCG/ACT nasal spray   Each Nare   Place 2 sprays into  both nostrils daily as needed for rhinitis.          Marland Kitchen loperamide (IMODIUM) 2 MG capsule   Oral   Take 2 mg by mouth as needed for diarrhea or loose stools.         Marland Kitchen OLANZapine (ZYPREXA) 5 MG tablet   Oral   Take 2.5 mg by mouth every 6 (six) hours as needed (for GI upset).          Marland Kitchen omeprazole (PRILOSEC) 20 MG capsule   Oral   Take 20 mg by mouth 2 (two) times daily.          . ondansetron (ZOFRAN) 8 MG tablet   Oral   Take 8 mg by mouth every 6 (six) hours as needed for nausea or vomiting.         . ondansetron (ZOFRAN-ODT) 4 MG disintegrating tablet   Oral   Take 4 mg by mouth every 8 (eight) hours as needed for nausea or vomiting.         Marland Kitchen oxyCODONE (OXY IR/ROXICODONE) 5 MG immediate release tablet   Oral   Take 2 tablets (10 mg total) by mouth every 4 (four) hours as needed for severe pain.   30 tablet   0   . promethazine (PHENERGAN) 25 MG  suppository   Rectal   Place 1 suppository (25 mg total) rectally every 6 (six) hours as needed for nausea or vomiting.   12 suppository   0   . Rivaroxaban (XARELTO) 15 MG TABS tablet   Oral   Take 15 mg by mouth 2 (two) times daily.         Marland Kitchen senna (SENOKOT) 8.6 MG TABS tablet   Oral   Take 2 tablets by mouth 2 (two) times daily as needed for mild constipation.         . sucralfate (CARAFATE) 1 G tablet   Oral   Take 1 g by mouth 3 (three) times daily as needed (for GI upset).          . SUMAtriptan (IMITREX) 50 MG tablet   Oral   Take 50 mg by mouth every 2 (two) hours as needed for migraine.          . traZODone (DESYREL) 50 MG tablet   Oral   Take 50 mg by mouth at bedtime.         . urea (CARMOL) 20 % cream   Topical   Apply 1 application topically as needed (for dry skin on feet).            Allergies Ativan and Bee venom  History reviewed. No pertinent family history.  Social History Social History  Substance Use Topics  . Smoking status: Former Research scientist (life sciences)  . Smokeless  tobacco: None  . Alcohol Use: No    Review of Systems  Constitutional: Negative for fever. Eyes: Negative for visual changes. ENT: Negative for sore throat. Cardiovascular: Negative for chest pain. Respiratory: Negative for shortness of breath. Gastrointestinal: Positive for abdominal pain, vomiting and diarrhea. Genitourinary: Negative for dysuria. Musculoskeletal: Negative for back pain. Skin: Negative for rash. Neurological: Negative for headaches, focal weakness or numbness.   10-point ROS otherwise negative.  ____________________________________________   PHYSICAL EXAM:  VITAL SIGNS: ED Triage Vitals  Enc Vitals Group     BP 04/25/15 2214 152/75 mmHg     Pulse Rate 04/25/15 2214 72     Resp 04/25/15 2214 20     Temp 04/25/15 2214 98.5 F (36.9 C)     Temp Source 04/25/15 2214 Oral     SpO2 04/25/15 2214 96 %     Weight 04/25/15 2214 195 lb (88.451 kg)     Height 04/25/15 2214 5\' 10"  (1.778 m)     Head Cir --      Peak Flow --      Pain Score 04/25/15 2300 7     Pain Loc --      Pain Edu? --      Excl. in [place]? --      Constitutional: Alert and oriented. Well appearing and in no distress. Eyes: Conjunctivae are normal. PERRL. Normal extraocular movements. ENT   Head: Normocephalic and atraumatic.   Nose: No congestion/rhinnorhea.   Mouth/Throat: Mucous membranes are moist.   Neck: No stridor. Cardiovascular: Normal rate, regular rhythm. Normal and symmetric distal pulses are present in all extremities. No murmurs, rubs, or gallops. Respiratory: Normal respiratory effort without tachypnea nor retractions. Breath sounds are clear and equal bilaterally. No wheezes/rales/rhonchi. Gastrointestinal: Generalized tenderness to palpation. No distention. There is no CVA tenderness. Genitourinary: deferred Musculoskeletal: Nontender with normal range of motion in all extremities. No joint effusions.  No lower extremity tenderness nor edema. Neurologic:   Normal speech and language. No gross focal neurologic deficits are appreciated.  Speech is normal.  Skin:  Skin is warm, dry and intact. No rash noted. Psychiatric: Mood and affect are normal. Speech and behavior are normal. Patient exhibits appropriate insight and judgment.  ____________________________________________    LABS (pertinent positives/negatives)  Labs Reviewed  COMPREHENSIVE METABOLIC PANEL - Abnormal; Notable for the following:    Glucose, Bld 132 (*)    ALT 14 (*)    All other components within normal limits  LIPASE, BLOOD - Abnormal; Notable for the following:    Lipase 13 (*)    All other components within normal limits  MAGNESIUM     ____________________________________________   EKG  ED ECG REPORT I, Jasiri Hanawalt, Killen N, the attending physician, personally viewed and interpreted this ECG.   Date: 04/25/2015  EKG Time: 10:28 PM  Rate: 76  Rhythm: Normal sinus rhythm with a right bundle-branch block  Axis: None  Intervals: Normal  ST&T Change: None      INITIAL IMPRESSION / ASSESSMENT AND PLAN / ED COURSE  Pertinent labs & imaging results that were available during my care of the patient were reviewed by me and considered in my medical decision making (see chart for details). Patient received Imodium and Zofran as well as Phenergan emergency department with complete resolution of symptoms Patient had no further vomiting or diarrhea in the emergency department. Patient was made aware of lymphadenopathy which was noted on a CT scan today before states that he has a follow-up appointment with his oncologist at the [facility].  ____________________________________________   FINAL CLINICAL IMPRESSION(S) / ED DIAGNOSES  Final diagnoses:  Generalized abdominal pain  Vomiting and diarrhea  Lymphadenopathy      Gregor Hams, MD 04/26/15 [phone]

## 2015-04-25 NOTE — ED Provider Notes (Signed)
[facility] Emergency Department Provider Note  ____________________________________________  Time seen: Approximately 317 AM  I have reviewed the triage vital signs and the nursing notes.   HISTORY  Chief Complaint Emesis and Abdominal Pain    HPI Alexander [name] is a 59 y.o. male who has a history of lymphoma and reports that he has been vomiting for the past 3 days. The patient reports that he has left lower quadrant pain. The patient reports that he has had abdominal pain in the past but this seems worse than before. The patient is dizzy and his mouth feels dry. The patient reports that every time he ate he vomited and there was occasional green appearing emesis as well. The patient denies any diarrhea and reports that he had a bowel movement here in the emergency department. The patient does not have any fever and reports that he has a history of diverticulosis but not diverticulitis. The patient reports he also has Barrett's esophagus and has a procedure planned with Dr. Candace Cruise. The patient also has some shortness of breath which she reports that it is common for him. The patient reports that he could not keep anything down and he tried to eat at the son-in-law's house today and vomited back up what he ate. The patient is here for evaluation.   Past Medical History  Diagnosis Date  . Mantle cell lymphoma   . COPD (chronic obstructive pulmonary disease)   . Hypertension   . Migraine   . Asthma     Patient Active Problem List   Diagnosis Date Noted  . DEVIATED SEPTUM 05/18/2007  . ALLERGIC RHINITIS 05/18/2007  . ASTHMA 05/18/2007    Past Surgical History  Procedure Laterality Date  . Throat surgery    . Barryx N/A   . Esophagogastroduodenoscopy (egd) with propofol N/A 02/23/2015    Procedure: ESOPHAGOGASTRODUODENOSCOPY (EGD) WITH PROPOFOL;  Surgeon: Hulen Luster, MD;  Location: [facility] ENDOSCOPY;  Service: Gastroenterology;  Laterality: N/A;    Current  Outpatient Rx  Name  Route  Sig  Dispense  Refill  . budesonide-formoterol (SYMBICORT) 80-4.5 MCG/ACT inhaler   Inhalation   Inhale 2 puffs into the lungs 2 (two) times daily as needed (for shortness of breath).          . dicyclomine (BENTYL) 20 MG tablet   Oral   Take 20 mg by mouth every 6 (six) hours as needed for spasms.          Marland Kitchen docusate sodium (COLACE) 100 MG capsule   Oral   Take 200 mg by mouth 2 (two) times daily as needed for mild constipation.         . fentaNYL (DURAGESIC - DOSED MCG/HR) 50 MCG/HR   Transdermal   Place 50 mcg onto the skin every 3 (three) days.         . fluticasone (FLONASE) 50 MCG/ACT nasal spray   Each Nare   Place 2 sprays into both nostrils daily as needed for rhinitis.          Marland Kitchen loperamide (IMODIUM) 2 MG capsule   Oral   Take 2 mg by mouth as needed for diarrhea or loose stools.         Marland Kitchen OLANZapine (ZYPREXA) 5 MG tablet   Oral   Take 2.5 mg by mouth every 6 (six) hours as needed (for GI upset).          Marland Kitchen omeprazole (PRILOSEC) 20 MG capsule   Oral  Take 20 mg by mouth 2 (two) times daily.          . ondansetron (ZOFRAN) 8 MG tablet   Oral   Take 8 mg by mouth every 6 (six) hours as needed for nausea or vomiting.         . ondansetron (ZOFRAN-ODT) 4 MG disintegrating tablet   Oral   Take 4 mg by mouth every 8 (eight) hours as needed for nausea or vomiting.         Marland Kitchen oxyCODONE (OXY IR/ROXICODONE) 5 MG immediate release tablet   Oral   Take 2 tablets (10 mg total) by mouth every 4 (four) hours as needed for severe pain.   30 tablet   0   . promethazine (PHENERGAN) 25 MG suppository   Rectal   Place 1 suppository (25 mg total) rectally every 6 (six) hours as needed for nausea or vomiting.   12 suppository   0   . Rivaroxaban (XARELTO) 15 MG TABS tablet   Oral   Take 15 mg by mouth 2 (two) times daily.         Marland Kitchen senna (SENOKOT) 8.6 MG TABS tablet   Oral   Take 2 tablets by mouth 2 (two) times daily  as needed for mild constipation.         . sucralfate (CARAFATE) 1 G tablet   Oral   Take 1 g by mouth 3 (three) times daily as needed (for GI upset).          . SUMAtriptan (IMITREX) 50 MG tablet   Oral   Take 50 mg by mouth every 2 (two) hours as needed for migraine.          . traZODone (DESYREL) 50 MG tablet   Oral   Take 50 mg by mouth at bedtime.         . urea (CARMOL) 20 % cream   Topical   Apply 1 application topically as needed (for dry skin on feet).            Allergies Ativan and Bee venom  No family history on file.  Social History Social History  Substance Use Topics  . Smoking status: Former Research scientist (life sciences)  . Smokeless tobacco: None  . Alcohol Use: None    Review of Systems Constitutional: No fever/chills Eyes: No visual changes. ENT: No sore throat. Cardiovascular: Denies chest pain. Respiratory: shortness of breath. Gastrointestinal: abdominal pain.   nausea,  vomiting.  No diarrhea.  No constipation. Genitourinary: Negative for dysuria. Musculoskeletal: Negative for back pain. Skin: Negative for rash. Neurological: Negative for headaches, focal weakness or numbness.  10-point ROS otherwise negative.  ____________________________________________   PHYSICAL EXAM:  VITAL SIGNS: ED Triage Vitals  Enc Vitals Group     BP 04/25/15 0225 139/86 mmHg     Pulse Rate 04/25/15 0225 70     Resp 04/25/15 0225 18     Temp 04/25/15 0225 98.3 F (36.8 C)     Temp Source 04/25/15 0225 Oral     SpO2 04/25/15 0225 97 %     Weight 04/25/15 0225 195 lb (88.451 kg)     Height 04/25/15 0225 5\' 10"  (1.778 m)     Head Cir --      Peak Flow --      Pain Score 04/25/15 0226 9     Pain Loc --      Pain Edu? --      Excl. in [place]? --  Constitutional: Alert and oriented. Well appearing and in moderate distress. Eyes: Conjunctivae are normal. PERRL. EOMI. Head: Atraumatic. Nose: No congestion/rhinnorhea. Mouth/Throat: Mucous membranes are moist.   Oropharynx non-erythematous. Cardiovascular: Normal rate, regular rhythm. Grossly normal heart sounds.  Good peripheral circulation. Respiratory: Normal respiratory effort.  No retractions. Lungs CTAB. Gastrointestinal: Soft and diffusely tender with no rebound or guarding. No distention. Positive bowel sounds Musculoskeletal: No lower extremity tenderness nor edema.   Neurologic:  Normal speech and language. No gross focal neurologic deficits are appreciated.  Skin:  Skin is warm, dry and intact.  Psychiatric: Mood and affect are normal.   ____________________________________________   LABS (all labs ordered are listed, but only abnormal results are displayed)  Labs Reviewed  LIPASE, BLOOD - Abnormal; Notable for the following:    Lipase 17 (*)    All other components within normal limits  COMPREHENSIVE METABOLIC PANEL - Abnormal; Notable for the following:    Glucose, Bld 130 (*)    Calcium 8.6 (*)    ALT 13 (*)    All other components within normal limits  CBC - Abnormal; Notable for the following:    RBC 4.29 (*)    HCT 38.0 (*)    All other components within normal limits  URINALYSIS COMPLETEWITH MICROSCOPIC ([facility] ONLY) - Abnormal; Notable for the following:    Color, Urine COLORLESS (*)    APPearance CLEAR (*)    All other components within normal limits   ____________________________________________  EKG  None ____________________________________________  RADIOLOGY  CT abd and pelvis: No focal acute process demonstrated in the abdomen or pelvis, small lymph nodes in the upper abdominal mesentery and portacaval region. Unchanged appearance of small left adrenal gland nodule, diverticulosis of the colon without evidence of diverticulitis, no bowel obstruction, mild diffuse wall thickening in the lower esophagus may indicate reflux disease.  ____________________________________________   PROCEDURES  Procedure(s) performed: none  Critical Care performed:  No  ____________________________________________   INITIAL IMPRESSION / ASSESSMENT AND PLAN / ED COURSE  Pertinent labs & imaging results that were available during my care of the patient were reviewed by me and considered in my medical decision making (see chart for details).  This is a 59 year old male who comes in with abdominal pain and vomiting today. The patient has a history of lymphoma and has been vomiting for 3 days. I gave the patient liter of normal saline as well as Zofran I also gave him a dose of Dilaudid. The patient was able to keep down his contrast and he did receive a GI cocktail and Carafate. The patient reports this pain is improved. The patient was able to keep down the contrast material. I will discharge the patient to home and have him follow-up with GI. The patient reports that he does understand that. I will give him some Phenergan suppositories as he has not been on that in the past. The patient otherwise has no further complaints or concerns and he understands the plan and agrees the plan as stated. ____________________________________________   FINAL CLINICAL IMPRESSION(S) / ED DIAGNOSES  Final diagnoses:  Generalized abdominal pain  Non-intractable vomiting with nausea, vomiting of unspecified type      Loney Hering, MD 04/25/15 [phone]

## 2015-04-25 NOTE — ED Notes (Signed)
Patient with no complaints at this time. Respirations even and unlabored. Skin warm/dry. Discharge instructions reviewed with patient at this time. Patient given opportunity to voice concerns/ask questions. IV removed per policy and band-aid applied to site. Patient discharged at this time and left Emergency Department, via wheelchair.   

## 2015-04-25 NOTE — ED Notes (Signed)
Patient to ED with c/o continued nausea and vomiting, was seen here for same this morning as well as now having diarrhea.

## 2015-04-25 NOTE — ED Notes (Signed)
MD Dahlia Client at bedside, completing medical evaluation.

## 2015-04-26 LAB — COMPREHENSIVE METABOLIC PANEL
ALK PHOS: 90 U/L (ref 38–126)
ALT: 14 U/L — ABNORMAL LOW (ref 17–63)
ANION GAP: 10 (ref 5–15)
AST: 17 U/L (ref 15–41)
Albumin: 4.4 g/dL (ref 3.5–5.0)
BUN: 11 mg/dL (ref 6–20)
CALCIUM: 9 mg/dL (ref 8.9–10.3)
CHLORIDE: 105 mmol/L (ref 101–111)
CO2: 25 mmol/L (ref 22–32)
Creatinine, Ser: 0.86 mg/dL (ref 0.61–1.24)
GFR calc non Af Amer: >60 mL/min (ref >60)
Glucose, Bld: 132 mg/dL — ABNORMAL HIGH (ref 65–99)
Potassium: 3.6 mmol/L (ref 3.5–5.1)
Sodium: 140 mmol/L (ref 135–145)
Total Bilirubin: 0.8 mg/dL (ref 0.3–1.2)
Total Protein: 7.2 g/dL (ref 6.5–8.1)

## 2015-04-26 LAB — MAGNESIUM: Magnesium: 2 mg/dL (ref 1.7–2.4)

## 2015-04-26 LAB — LIPASE, BLOOD: Lipase: 13 U/L — ABNORMAL LOW (ref 22–51)

## 2015-04-26 MED ORDER — MORPHINE SULFATE (CONCENTRATE) 10 MG/0.5ML PO SOLN
10.0000 mg | Freq: Once | ORAL | Status: AC
  Administered 2015-04-26: 10 mg via ORAL
  Filled 2015-04-26: qty 1

## 2015-04-26 MED ORDER — ONDANSETRON HCL 4 MG/2ML IJ SOLN
INTRAMUSCULAR | Status: AC
  Filled 2015-04-26: qty 4

## 2015-04-26 NOTE — Discharge Instructions (Signed)

## 2015-04-26 NOTE — ED Notes (Signed)
Pt ambulated to the bedside commode accompanied by [employer].

## 2015-05-04 ENCOUNTER — Ambulatory Visit: Payer: Medicaid Other | Admitting: Anesthesiology

## 2015-05-04 ENCOUNTER — Ambulatory Visit
Admission: RE | Admit: 2015-05-04 | Discharge: 2015-05-04 | Disposition: A | Discharge status: Home / Self Care | Payer: Medicaid Other | Source: Ambulatory Visit | Attending: Gastroenterology | Admitting: Gastroenterology

## 2015-05-04 ENCOUNTER — Encounter
Admission: RE | Disposition: A | Discharge status: Home / Self Care | Payer: Self-pay | Source: Ambulatory Visit | Attending: Gastroenterology

## 2015-05-04 DIAGNOSIS — K2270 Barrett's esophagus without dysplasia: Secondary | ICD-10-CM | POA: Diagnosis present

## 2015-05-04 DIAGNOSIS — Z79899 Other long term (current) drug therapy: Secondary | ICD-10-CM | POA: Insufficient documentation

## 2015-05-04 DIAGNOSIS — I10 Essential (primary) hypertension: Secondary | ICD-10-CM | POA: Insufficient documentation

## 2015-05-04 DIAGNOSIS — Z8572 Personal history of non-Hodgkin lymphomas: Secondary | ICD-10-CM | POA: Insufficient documentation

## 2015-05-04 DIAGNOSIS — J45909 Unspecified asthma, uncomplicated: Secondary | ICD-10-CM | POA: Insufficient documentation

## 2015-05-04 DIAGNOSIS — Z79891 Long term (current) use of opiate analgesic: Secondary | ICD-10-CM | POA: Insufficient documentation

## 2015-05-04 DIAGNOSIS — Z7951 Long term (current) use of inhaled steroids: Secondary | ICD-10-CM | POA: Diagnosis not present

## 2015-05-04 DIAGNOSIS — J449 Chronic obstructive pulmonary disease, unspecified: Secondary | ICD-10-CM | POA: Diagnosis not present

## 2015-05-04 DIAGNOSIS — Z7901 Long term (current) use of anticoagulants: Secondary | ICD-10-CM | POA: Diagnosis not present

## 2015-05-04 DIAGNOSIS — Z87891 Personal history of nicotine dependence: Secondary | ICD-10-CM | POA: Insufficient documentation

## 2015-05-04 HISTORY — PX: ESOPHAGOGASTRODUODENOSCOPY (EGD) WITH PROPOFOL: SHX5813

## 2015-05-04 SURGERY — ESOPHAGOGASTRODUODENOSCOPY (EGD) WITH PROPOFOL
Anesthesia: General

## 2015-05-04 MED ORDER — STERILE WATER FOR INJECTION IJ SOLN
Freq: Once | RESPIRATORY_TRACT | Status: AC
  Administered 2015-05-04: 11:00:00 via OROMUCOSAL
  Filled 2015-05-04: qty 3

## 2015-05-04 MED ORDER — GLYCOPYRROLATE 0.2 MG/ML IJ SOLN
INTRAMUSCULAR | Status: DC | PRN
  Administered 2015-05-04: 0.2 mg via INTRAVENOUS

## 2015-05-04 MED ORDER — PROPOFOL INFUSION 10 MG/ML OPTIME
INTRAVENOUS | Status: DC | PRN
  Administered 2015-05-04: 160 ug/kg/min via INTRAVENOUS

## 2015-05-04 MED ORDER — MIDAZOLAM HCL 2 MG/2ML IJ SOLN
INTRAMUSCULAR | Status: DC | PRN
  Administered 2015-05-04: 1 mg via INTRAVENOUS

## 2015-05-04 MED ORDER — ASPIRIN 325 MG PO TABS
325.0000 mg | ORAL_TABLET | Freq: Every day | ORAL | Status: DC
  Administered 2015-05-04: 325 mg via ORAL
  Filled 2015-05-04: qty 1

## 2015-05-04 MED ORDER — PHENYLEPHRINE HCL 10 MG/ML IJ SOLN
INTRAMUSCULAR | Status: DC | PRN
  Administered 2015-05-04 (×2): 100 ug via INTRAVENOUS
  Administered 2015-05-04: 50 ug via INTRAVENOUS

## 2015-05-04 MED ORDER — SODIUM CHLORIDE 0.9 % IV SOLN
INTRAVENOUS | Status: DC
  Administered 2015-05-04 (×2): via INTRAVENOUS

## 2015-05-04 MED ORDER — SODIUM CHLORIDE 0.9 % IV SOLN: INTRAVENOUS | Status: DC

## 2015-05-04 MED ORDER — ALFENTANIL 500 MCG/ML IJ INJ
INJECTION | INTRAMUSCULAR | Status: DC | PRN
  Administered 2015-05-04: 500 ug via INTRAVENOUS

## 2015-05-04 MED ORDER — MORPHINE SULFATE (PF) 2 MG/ML IV SOLN
1.0000 mg | INTRAVENOUS | Status: AC
  Administered 2015-05-04: 1 mg via INTRAVENOUS

## 2015-05-04 MED ORDER — NITROGLYCERIN 0.4 MG SL SUBL
0.4000 mg | SUBLINGUAL_TABLET | SUBLINGUAL | Status: DC | PRN
  Administered 2015-05-04: 0.4 mg via SUBLINGUAL

## 2015-05-04 MED ORDER — ONDANSETRON HCL 4 MG/2ML IJ SOLN
INTRAMUSCULAR | Status: DC | PRN
  Administered 2015-05-04: 4 mg via INTRAVENOUS

## 2015-05-04 NOTE — OR Nursing (Signed)
Patient no longer short of breath.  Chest apin relieved. vss stable .  Dr. Candace Cruise aware.  Patient allowed to be discharged.

## 2015-05-04 NOTE — Op Note (Signed)
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 Off xarelto. S/P Barrx x                     3. Providers:         Lupita Dawn. Candace Cruise, MD Referring MD:      Perrin Maltese, MD (Referring MD) Medicines:         Monitored Anesthesia Care Complications:     No immediate complications. Procedure:         Pre-Anesthesia Assessment:                    - Prior to the procedure, a History and Physical was                     performed, and patient medications, allergies and                     sensitivities were reviewed. The patient's tolerance of                     previous anesthesia was reviewed.                    - The risks and benefits of the procedure and the sedation                     options and risks were discussed with the patient. All                     questions were answered and informed consent was obtained.                    - After reviewing the risks and benefits, the patient was                     deemed in satisfactory condition to undergo the procedure.                    After obtaining informed consent, the endoscope was passed                     under direct vision. Throughout the procedure, the                     patient's blood pressure, pulse, and oxygen saturations                     were monitored continuously. The Endoscope was introduced                     through the mouth, and advanced to the second part of                     duodenum. The upper GI endoscopy was accomplished without  difficulty. The patient tolerated the procedure well. Findings:      The  esophagus and gastroesophageal junction were examined with white       light. There were esophageal mucosal changes consistent with       short-segment Barrett's esophagus, extending from the upper extent of       the gastric folds which were at 37 cm from the incisors to the Z-line       which was at 34 cm from the incisors. Scattered islands of squamous       mucosa were present. The maximum longitudinal extent of these esophageal       mucosal changes was 3 cm in length. Barrett's mostly on one side. Focal       radiofrequency ablation of Barrett's esophagus was performed. With the       endoscope in place, the position and extent of the Barrett's mucosa and       the anatomic landmarks were noted. The Barrett's mucosa was irrigated       with N-acetylcysteine (Mucomyst) 1% mixed with water. Gastric and       esophageal contents were suctioned. The radiofrequency channel ablation       catheter was introduced through the endoscope working channel. Barrett's       tissue was targeted. The endoscope with the ablation catheter was       advanced to the areas of Barrett's mucosa. The areas included islands of       Barrett's mucosa. The endoscope with the channel ablation catheter was       positioned under direct visualization so that the catheter was placed in       contact with the surface of the Barrett's mucosa. Energy was applied       twice at 12 J/cm2. Ablation was repeated in a likewise fashion to all       remaining visible Barrett's mucosa. The channel ablation catheter was       then removed through the endoscope working channel, and the ablation       catheter was cleaned. The endoscope was left in place. The ablation zone       was cleaned of coagulative debris. The ablation catheter was reinserted       into the endoscope working channel. A second round of ablation was then       performed. Energy was applied twice at 12 J/cm2 to retreat the areas of       Barrett's epithelium  that had been treated with the first series of       ablation. The ablation catheter was removed through the endoscope       working channel. The areas of the esophagus where Barrett's mucosa had       been ablated were carefully examined. The endoscope was then removed.       Total of 16 treatments given.      The entire examined stomach was normal.      The examined duodenum was normal. Impression:        - Esophageal mucosal changes consistent with short-segment                     Barrett's esophagus. Treated with radiofrequency ablation.                    - Normal stomach.                    -  Normal examined duodenum.                    - No specimens collected. Recommendation:    - Discharge patient to home.                    - Observe patient's clinical course.                    - The findings and recommendations were discussed with the                     patient.                    - The findings and recommendations were discussed with the                     patient.                    - Pain meds prn. Resume xarelto in 3 days. Procedure Code(s): --- Professional ---                    [phone], Esophagogastroduodenoscopy, flexible, transoral;                     with ablation of tumor(s), polyp(s), or other lesion(s)                     (includes pre- and post-dilation and guide wire passage,                     when performed) Diagnosis Code(s): --- Professional ---                    K22.70, Barrett's esophagus without dysplasia CPT copyright 2014 [employer]. All rights reserved. The codes documented in this report are preliminary and upon coder review may  be revised to meet current compliance requirements. Hulen Luster, MD 05/04/2015 10:56:23 AM This report has been signed electronically. Number of Addenda: 0 Note Initiated On: 05/04/2015 10:17 AM      [facility]

## 2015-05-04 NOTE — Anesthesia Preprocedure Evaluation (Signed)
Anesthesia Evaluation  Patient identified by MRN, date of birth, ID band Patient awake    Reviewed: Allergy & Precautions, NPO status , Patient's Chart, lab work & pertinent test results  Airway Mallampati: II       Dental  (+) Edentulous Upper, Edentulous Lower   Pulmonary asthma , COPD,  COPD inhaler, former smoker,     + decreased breath sounds      Cardiovascular hypertension, negative cardio ROS Normal cardiovascular exam     Neuro/Psych  Headaches,    GI/Hepatic Neg liver ROS, GERD  Controlled,  Endo/Other  negative endocrine ROS  Renal/GU negative Renal ROS     Musculoskeletal negative musculoskeletal ROS (+)   Abdominal Normal abdominal exam  (+)   Peds negative pediatric ROS (+)  Hematology negative hematology ROS (+)   Anesthesia Other Findings   Reproductive/Obstetrics negative OB ROS                             Anesthesia Physical Anesthesia Plan  ASA: II  Anesthesia Plan: General   Post-op Pain Management:    Induction: Intravenous  Airway Management Planned: Nasal Cannula  Additional Equipment:   Intra-op Plan:   Post-operative Plan:   Informed Consent: I have reviewed the patients History and Physical, chart, labs and discussed the procedure including the risks, benefits and alternatives for the proposed anesthesia with the patient or authorized representative who has indicated his/her understanding and acceptance.     Plan Discussed with: CRNA  Anesthesia Plan Comments:         Anesthesia Quick Evaluation

## 2015-05-04 NOTE — OR Nursing (Signed)
oPatient began complaining of shortness of breath and chest tightness of eight.  Nasal cannula at 2l/min reapplied.  Patient in sitting position head of bed up 90degrees.  Vital signs stable.  Dr Candace Cruise and Dr. Micah Flesher notified.

## 2015-05-04 NOTE — H&P (Signed)
Primary Care Physician:  Perrin Maltese, MD Primary Gastroenterologist:  Dr. Candace Cruise  Pre-Procedure History & Physical: HPI:  Alexander [name] is a 59 y.o. male is here for an EGD with poss Barrx.   Past Medical History  Diagnosis Date  . Mantle cell lymphoma   . COPD (chronic obstructive pulmonary disease)   . Hypertension   . Migraine   . Asthma     Past Surgical History  Procedure Laterality Date  . Throat surgery    . Barryx N/A   . Esophagogastroduodenoscopy (egd) with propofol N/A 02/23/2015    Procedure: ESOPHAGOGASTRODUODENOSCOPY (EGD) WITH PROPOFOL;  Surgeon: Hulen Luster, MD;  Location: [facility] ENDOSCOPY;  Service: Gastroenterology;  Laterality: N/A;    Prior to Admission medications   Medication Sig Start Date End Date Taking? Authorizing Provider  ondansetron (ZOFRAN) 8 MG tablet Take 8 mg by mouth every 6 (six) hours as needed for nausea or vomiting.   Yes Historical Provider, MD  traZODone (DESYREL) 50 MG tablet Take 50 mg by mouth at bedtime.   Yes Historical Provider, MD  budesonide-formoterol (SYMBICORT) 80-4.5 MCG/ACT inhaler Inhale 2 puffs into the lungs 2 (two) times daily as needed (for shortness of breath).     Historical Provider, MD  dicyclomine (BENTYL) 20 MG tablet Take 20 mg by mouth every 6 (six) hours as needed for spasms.     Historical Provider, MD  docusate sodium (COLACE) 100 MG capsule Take 200 mg by mouth 2 (two) times daily as needed for mild constipation.    Historical Provider, MD  fentaNYL (DURAGESIC - DOSED MCG/HR) 50 MCG/HR Place 50 mcg onto the skin every 3 (three) days.    Historical Provider, MD  fluticasone (FLONASE) 50 MCG/ACT nasal spray Place 2 sprays into both nostrils daily as needed for rhinitis.     Historical Provider, MD  loperamide (IMODIUM) 2 MG capsule Take 2 mg by mouth as needed for diarrhea or loose stools.    Historical Provider, MD  OLANZapine (ZYPREXA) 5 MG tablet Take 2.5 mg by mouth every 6 (six) hours as needed (for GI upset).      Historical Provider, MD  omeprazole (PRILOSEC) 20 MG capsule Take 20 mg by mouth 2 (two) times daily.     Historical Provider, MD  ondansetron (ZOFRAN-ODT) 4 MG disintegrating tablet Take 4 mg by mouth every 8 (eight) hours as needed for nausea or vomiting.    Historical Provider, MD  oxyCODONE (OXY IR/ROXICODONE) 5 MG immediate release tablet Take 2 tablets (10 mg total) by mouth every 4 (four) hours as needed for severe pain. 02/26/15   Hulen Luster, MD  promethazine (PHENERGAN) 25 MG suppository Place 1 suppository (25 mg total) rectally every 6 (six) hours as needed for nausea or vomiting. 04/25/15 04/24/16  Loney Hering, MD  Rivaroxaban (XARELTO) 15 MG TABS tablet Take 15 mg by mouth 2 (two) times daily.    Historical Provider, MD  senna (SENOKOT) 8.6 MG TABS tablet Take 2 tablets by mouth 2 (two) times daily as needed for mild constipation.    Historical Provider, MD  sucralfate (CARAFATE) 1 G tablet Take 1 g by mouth 3 (three) times daily as needed (for GI upset).     Historical Provider, MD  SUMAtriptan (IMITREX) 50 MG tablet Take 50 mg by mouth every 2 (two) hours as needed for migraine.     Historical Provider, MD  urea (CARMOL) 20 % cream Apply 1 application topically as needed (for dry  skin on feet).     Historical Provider, MD    Allergies as of 03/29/2015 - Review Complete 02/23/2015  Allergen Reaction Noted  . Ativan [lorazepam] Other (See Comments) 01/12/2015    No family history on file.  Social History   Social History  . Marital Status: Married    Spouse Name: N/A  . Number of Children: N/A  . Years of Education: N/A   Occupational History  . Not on file.   Social History Main Topics  . Smoking status: Former Research scientist (life sciences)  . Smokeless tobacco: Not on file  . Alcohol Use: No  . Drug Use: Not on file  . Sexual Activity: Not on file   Other Topics Concern  . Not on file   Social History Narrative    Review of Systems: See HPI, otherwise negative ROS  Physical  Exam: BP 117/72 mmHg  Pulse 73  Temp(Src) 96.6 F (35.9 C) (Tympanic)  Resp 19  Ht 5\' 10"  (1.778 m)  Wt 88.451 kg (195 lb)  BMI 27.98 kg/m2  SpO2 95% General:   Alert,  pleasant and cooperative in NAD Head:  Normocephalic and atraumatic. Neck:  Supple; no masses or thyromegaly. Lungs:  Clear throughout to auscultation.    Heart:  Regular rate and rhythm. Abdomen:  Soft, nontender and nondistended. Normal bowel sounds, without guarding, and without rebound.   Neurologic:  Alert and  oriented x4;  grossly normal neurologically.  Impression/Plan: [name] is here for an EGD to be performed for hx of Barrett's with dysplasia.  Risks, benefits, limitations, and alternatives regarding EGD have been reviewed with the patient.  Questions have been answered.  All parties agreeable.   Raeley Gilmore, Lupita Dawn, MD  05/04/2015, 10:20 AM

## 2015-05-04 NOTE — Anesthesia Postprocedure Evaluation (Signed)
  Anesthesia Post-op Note  Patient: Alexander [name]  Procedure(s) Performed: Procedure(s): ESOPHAGOGASTRODUODENOSCOPY (EGD) WITH PROPOFOL (N/A)  Anesthesia type:General  Patient location: PACU  Post pain: Pain level controlled  Post assessment: Post-op Vital signs reviewed, Patient's Cardiovascular Status Stable, Respiratory Function Stable, Patent Airway and No signs of Nausea or vomiting  Post vital signs: Reviewed and stable  Last Vitals:  Filed Vitals:   05/04/15 1048  BP: 90/46  Pulse: 59  Temp: 36.5 C  Resp: 10    Level of consciousness: awake, alert  and patient cooperative  Complications: No apparent anesthesia complications

## 2015-05-04 NOTE — OR Nursing (Signed)
Patient's fourth barrx's procedure for barrett's esophagus. TIM 34 cm and TGF at 37 cm Used a Channel Catheter  Patient tolerated well

## 2015-05-04 NOTE — Transfer of Care (Signed)
Immediate Anesthesia Transfer of Care Note  Patient: Alexander [name]  Procedure(s) Performed: Procedure(s): ESOPHAGOGASTRODUODENOSCOPY (EGD) WITH PROPOFOL (N/A)  Patient Location: PACU and Endoscopy Unit  Anesthesia Type:General  Level of Consciousness: sedated and responds to stimulation  Airway & Oxygen Therapy: Patient Spontanous Breathing and Patient connected to nasal cannula oxygen  Post-op Assessment: Report given to RN and Post -op Vital signs reviewed and stable  Post vital signs: Reviewed and stable  Last Vitals:  Filed Vitals:   05/04/15 1048  BP: 90/46  Pulse: 59  Temp: 36.5 C  Resp: 10    Complications: No apparent anesthesia complications

## 2015-05-04 NOTE — OR Nursing (Signed)
Dr. Micah Flesher in to see patient. Stat EKG ordered

## 2015-05-04 NOTE — Anesthesia Postprocedure Evaluation (Signed)
  Anesthesia Post-op Note  Patient: Alexander [name]  Procedure(s) Performed: Procedure(s): ESOPHAGOGASTRODUODENOSCOPY (EGD) WITH PROPOFOL (N/A)  Anesthesia type:General  Patient location: PACU  Post pain: Pain level controlled  Post assessment: Post-op Vital signs reviewed, Patient's Cardiovascular Status Stable, Respiratory Function Stable, Patent Airway and No signs of Nausea or vomiting  Post vital signs: Reviewed and stable  Last Vitals:  Filed Vitals:   05/04/15 0919  BP: 117/72  Pulse: 73  Temp: 35.9 C  Resp: 19    Level of consciousness: awake, alert  and patient cooperative  Complications: No apparent anesthesia complications

## 2015-05-06 ENCOUNTER — Encounter: Payer: Self-pay | Admitting: Gastroenterology

## 2015-05-18 ENCOUNTER — Inpatient Hospital Stay
Admission: AD | Admit: 2015-05-18 | Discharge: 2015-05-19 | DRG: 391 | Disposition: A | Discharge status: Home / Self Care | Payer: Medicaid Other | Source: Ambulatory Visit | Attending: Internal Medicine | Admitting: Internal Medicine

## 2015-05-18 DIAGNOSIS — R0602 Shortness of breath: Secondary | ICD-10-CM

## 2015-05-18 DIAGNOSIS — Z7901 Long term (current) use of anticoagulants: Secondary | ICD-10-CM | POA: Diagnosis not present

## 2015-05-18 DIAGNOSIS — I10 Essential (primary) hypertension: Secondary | ICD-10-CM | POA: Diagnosis present

## 2015-05-18 DIAGNOSIS — Z86718 Personal history of other venous thrombosis and embolism: Secondary | ICD-10-CM | POA: Diagnosis not present

## 2015-05-18 DIAGNOSIS — Z91030 Bee allergy status: Secondary | ICD-10-CM | POA: Diagnosis not present

## 2015-05-18 DIAGNOSIS — J45909 Unspecified asthma, uncomplicated: Secondary | ICD-10-CM | POA: Diagnosis present

## 2015-05-18 DIAGNOSIS — Z79891 Long term (current) use of opiate analgesic: Secondary | ICD-10-CM

## 2015-05-18 DIAGNOSIS — Z8572 Personal history of non-Hodgkin lymphomas: Secondary | ICD-10-CM | POA: Diagnosis not present

## 2015-05-18 DIAGNOSIS — E860 Dehydration: Secondary | ICD-10-CM | POA: Diagnosis present

## 2015-05-18 DIAGNOSIS — N400 Enlarged prostate without lower urinary tract symptoms: Secondary | ICD-10-CM | POA: Diagnosis present

## 2015-05-18 DIAGNOSIS — J189 Pneumonia, unspecified organism: Secondary | ICD-10-CM | POA: Diagnosis present

## 2015-05-18 DIAGNOSIS — Z888 Allergy status to other drugs, medicaments and biological substances status: Secondary | ICD-10-CM | POA: Diagnosis not present

## 2015-05-18 DIAGNOSIS — J449 Chronic obstructive pulmonary disease, unspecified: Secondary | ICD-10-CM | POA: Diagnosis present

## 2015-05-18 DIAGNOSIS — J342 Deviated nasal septum: Secondary | ICD-10-CM | POA: Diagnosis present

## 2015-05-18 DIAGNOSIS — Z87891 Personal history of nicotine dependence: Secondary | ICD-10-CM

## 2015-05-18 DIAGNOSIS — R197 Diarrhea, unspecified: Secondary | ICD-10-CM | POA: Diagnosis present

## 2015-05-18 DIAGNOSIS — Z79899 Other long term (current) drug therapy: Secondary | ICD-10-CM | POA: Diagnosis not present

## 2015-05-18 DIAGNOSIS — G43909 Migraine, unspecified, not intractable, without status migrainosus: Secondary | ICD-10-CM | POA: Diagnosis present

## 2015-05-18 HISTORY — DX: Acute embolism and thrombosis of unspecified deep veins of unspecified lower extremity: I82.409

## 2015-05-18 HISTORY — DX: Deviated nasal septum: J34.2

## 2015-05-18 HISTORY — DX: Benign prostatic hyperplasia without lower urinary tract symptoms: N40.0

## 2015-05-18 LAB — COMPREHENSIVE METABOLIC PANEL
ALK PHOS: 90 U/L (ref 38–126)
ALT: 8 U/L — AB (ref 17–63)
AST: 19 U/L (ref 15–41)
Albumin: 4 g/dL (ref 3.5–5.0)
Anion gap: 6 (ref 5–15)
BILIRUBIN TOTAL: 0.6 mg/dL (ref 0.3–1.2)
BUN: 15 mg/dL (ref 6–20)
CO2: 26 mmol/L (ref 22–32)
CREATININE: 0.94 mg/dL (ref 0.61–1.24)
Calcium: 8.3 mg/dL — ABNORMAL LOW (ref 8.9–10.3)
Chloride: 104 mmol/L (ref 101–111)
GFR calc Af Amer: >60 mL/min (ref >60)
Glucose, Bld: 121 mg/dL — ABNORMAL HIGH (ref 65–99)
Potassium: 3.7 mmol/L (ref 3.5–5.1)
Sodium: 136 mmol/L (ref 135–145)
TOTAL PROTEIN: 7 g/dL (ref 6.5–8.1)

## 2015-05-18 LAB — CBC WITH DIFFERENTIAL/PLATELET
BASOS ABS: 0.1 10*3/uL (ref 0–0.1)
Basophils Relative: 1 %
EOS ABS: 0.1 10*3/uL (ref 0–0.7)
EOS PCT: 1 %
HCT: 45.8 % (ref 40.0–52.0)
Hemoglobin: 15.3 g/dL (ref 13.0–18.0)
LYMPHS ABS: 1.3 10*3/uL (ref 1.0–3.6)
Lymphocytes Relative: 12 %
MCH: 29.9 pg (ref 26.0–34.0)
MCHC: 33.3 g/dL (ref 32.0–36.0)
MCV: 89.9 fL (ref 80.0–100.0)
MONO ABS: 0.4 10*3/uL (ref 0.2–1.0)
Monocytes Relative: 4 %
Neutro Abs: 9.6 10*3/uL — ABNORMAL HIGH (ref 1.4–6.5)
Neutrophils Relative %: 84 %
PLATELETS: 241 10*3/uL (ref 150–440)
RBC: 5.1 MIL/uL (ref 4.40–5.90)
RDW: 13.9 % (ref 11.5–14.5)
WBC: 11.5 10*3/uL — AB (ref 3.8–10.6)

## 2015-05-18 MED ORDER — PANTOPRAZOLE SODIUM 40 MG PO TBEC
40.0000 mg | DELAYED_RELEASE_TABLET | Freq: Every day | ORAL | Status: DC
  Administered 2015-05-19: 40 mg via ORAL
  Filled 2015-05-18: qty 1

## 2015-05-18 MED ORDER — OXYCODONE HCL 5 MG PO TABS: 5.0000 mg | ORAL_TABLET | Freq: Once | ORAL | Status: DC | PRN

## 2015-05-18 MED ORDER — OXYCODONE HCL 5 MG/5ML PO SOLN: 5.0000 mg | Freq: Once | ORAL | Status: DC | PRN

## 2015-05-18 MED ORDER — ALBUTEROL SULFATE (2.5 MG/3ML) 0.083% IN NEBU: 2.5000 mg | INHALATION_SOLUTION | RESPIRATORY_TRACT | Status: DC | PRN

## 2015-05-18 MED ORDER — ONDANSETRON 4 MG PO TBDP
4.0000 mg | ORAL_TABLET | Freq: Three times a day (TID) | ORAL | Status: DC | PRN
  Filled 2015-05-18: qty 1

## 2015-05-18 MED ORDER — ONDANSETRON HCL 4 MG/2ML IJ SOLN
4.0000 mg | Freq: Four times a day (QID) | INTRAMUSCULAR | Status: DC | PRN
Start: 2015-05-18 — End: 2015-05-19
  Administered 2015-05-19: 09:00:00 4 mg via INTRAVENOUS
  Filled 2015-05-18 (×2): qty 2

## 2015-05-18 MED ORDER — SUCRALFATE 1 G PO TABS: 1.0000 g | ORAL_TABLET | Freq: Three times a day (TID) | ORAL | Status: DC | PRN

## 2015-05-18 MED ORDER — FENTANYL 50 MCG/HR TD PT72: 50.0000 ug | MEDICATED_PATCH | TRANSDERMAL | Status: DC

## 2015-05-18 MED ORDER — DEXTROSE 5 % IV SOLN
500.0000 mg | INTRAVENOUS | Status: DC
  Administered 2015-05-18: 500 mg via INTRAVENOUS
  Filled 2015-05-18 (×3): qty 500

## 2015-05-18 MED ORDER — RIVAROXABAN 20 MG PO TABS
20.0000 mg | ORAL_TABLET | Freq: Every day | ORAL | Status: DC
  Filled 2015-05-18: qty 1

## 2015-05-18 MED ORDER — OXYCODONE HCL 5 MG PO TABS
10.0000 mg | ORAL_TABLET | ORAL | Status: DC | PRN
  Administered 2015-05-18 – 2015-05-19 (×4): 10 mg via ORAL
  Filled 2015-05-18 (×4): qty 2

## 2015-05-18 MED ORDER — DEXTROSE 5 % IV SOLN
1.0000 g | INTRAVENOUS | Status: DC
  Administered 2015-05-18: 20:00:00 1 g via INTRAVENOUS
  Filled 2015-05-18 (×3): qty 10

## 2015-05-18 MED ORDER — RIVAROXABAN 15 MG PO TABS: 15.0000 mg | ORAL_TABLET | Freq: Two times a day (BID) | ORAL | Status: DC

## 2015-05-18 MED ORDER — ACETAMINOPHEN 650 MG RE SUPP: 650.0000 mg | Freq: Four times a day (QID) | RECTAL | Status: DC | PRN

## 2015-05-18 MED ORDER — ACETAMINOPHEN 325 MG PO TABS: 650.0000 mg | ORAL_TABLET | Freq: Four times a day (QID) | ORAL | Status: DC | PRN

## 2015-05-18 MED ORDER — TRAZODONE HCL 50 MG PO TABS
50.0000 mg | ORAL_TABLET | Freq: Every day | ORAL | Status: DC
  Administered 2015-05-18: 20:00:00 50 mg via ORAL
  Filled 2015-05-18: qty 1

## 2015-05-18 MED ORDER — HEPARIN SODIUM (PORCINE) 5000 UNIT/ML IJ SOLN: 5000.0000 [IU] | Freq: Three times a day (TID) | INTRAMUSCULAR | Status: DC

## 2015-05-18 MED ORDER — FLUTICASONE PROPIONATE 50 MCG/ACT NA SUSP
2.0000 | Freq: Every day | NASAL | Status: DC | PRN
  Filled 2015-05-18: qty 16

## 2015-05-18 MED ORDER — OLANZAPINE 2.5 MG PO TABS
2.5000 mg | ORAL_TABLET | Freq: Four times a day (QID) | ORAL | Status: DC | PRN
  Administered 2015-05-18: 2.5 mg via ORAL
  Filled 2015-05-18 (×2): qty 1

## 2015-05-18 MED ORDER — ONDANSETRON HCL 4 MG PO TABS: 4.0000 mg | ORAL_TABLET | Freq: Four times a day (QID) | ORAL | Status: DC | PRN

## 2015-05-18 MED ORDER — FENTANYL CITRATE (PF) 100 MCG/2ML IJ SOLN: 25.0000 ug | INTRAMUSCULAR | Status: DC | PRN

## 2015-05-18 MED ORDER — SODIUM CHLORIDE 0.9 % IV SOLN
INTRAVENOUS | Status: DC
  Administered 2015-05-18 – 2015-05-19 (×3): via INTRAVENOUS

## 2015-05-18 MED ORDER — SUMATRIPTAN SUCCINATE 50 MG PO TABS
50.0000 mg | ORAL_TABLET | ORAL | Status: DC | PRN
  Filled 2015-05-18: qty 1

## 2015-05-18 NOTE — H&P (Signed)
[facility] [place] NAME: Alexander [name]    MR#:  [id number]  DATE OF BIRTH:  [birth year]  DATE OF ADMISSION:  05/18/2015  PRIMARY CARE PHYSICIAN: Perrin Maltese, MD   REQUESTING/REFERRING PHYSICIAN:   CHIEF COMPLAINT:  No chief complaint on file.   HISTORY OF PRESENT ILLNESS: Alexander [name]  is a 59 y.o. male with a known history of mantle cell lymphoma, COPD, hypertension, migraines and BPH who went to see his primary care provider with shortness of breath and cough last week and was noted to have a right-sided pneumonia. Patient was started on by mouth Levaquin he reports that since yesterday he started having severe diarrhea multiple times a day. He went to see his primary care provider and he was referred to the hospital for admission for dehydration. Patient complains of failure of feeling very weak and tired. Continues to have cough and shortness of breath. Feels that his pneumonia is not better PAST MEDICAL HISTORY:   Past Medical History  Diagnosis Date  . Mantle cell lymphoma   . COPD (chronic obstructive pulmonary disease)   . Hypertension   . Migraine   . Asthma   . BPH (benign prostatic hyperplasia)   . Deviated nasal septum     PAST SURGICAL HISTORY:  Past Surgical History  Procedure Laterality Date  . Throat surgery    . Barryx N/A   . Esophagogastroduodenoscopy (egd) with propofol N/A 02/23/2015    Procedure: ESOPHAGOGASTRODUODENOSCOPY (EGD) WITH PROPOFOL;  Surgeon: Hulen Luster, MD;  Location[facility] ENDOSCOPY;  Service: Gastroenterology;  Laterality: N/A;  . Esophagogastroduodenoscopy (egd) with propofol N/A 05/04/2015    Procedure: ESOPHAGOGASTRODUODENOSCOPY (EGD) WITH PROPOFOL;  Surgeon: Hulen Luster, MD;  Location: [facility] ENDOSCOPY;  Service: Gastroenterology;  Laterality: N/A;  . Tonsillectomy      SOCIAL HISTORY:  Social History  Substance Use Topics  . Smoking status: Former Research scientist (life sciences)  . Smokeless tobacco: Not on file   . Alcohol Use: No    FAMILY HISTORY:  Family History  Problem Relation Age of Onset  . CAD    . Diabetes      DRUG ALLERGIES:  Allergies  Allergen Reactions  . Ativan [Lorazepam] Other (See Comments)    Reaction:  Dizziness   . Bee Venom     REVIEW OF SYSTEMS:   CONSTITUTIONAL: No fever, positive fatigue and weakness.  EYES: No blurred or double vision.  EARS, NOSE, AND THROAT: No tinnitus or ear pain.  RESPIRATORY: No cough, positive shortness of breath, no wheezing or hemoptysis.  CARDIOVASCULAR: No chest pain, orthopnea, edema.  GASTROINTESTINAL: No nausea, vomiting, positive diarrhea no abdominal pain.  GENITOURINARY: No dysuria, hematuria.  ENDOCRINE: No polyuria, nocturia,  HEMATOLOGY: No anemia, easy bruising or bleeding SKIN: No rash or lesion. MUSCULOSKELETAL: No joint pain or arthritis.   NEUROLOGIC: No tingling, numbness, weakness.  PSYCHIATRY: No anxiety or depression.   MEDICATIONS AT HOME:  Prior to Admission medications   Medication Sig Start Date End Date Taking? Authorizing Provider  budesonide-formoterol (SYMBICORT) 80-4.5 MCG/ACT inhaler Inhale 2 puffs into the lungs 2 (two) times daily as needed (for shortness of breath).    Yes Historical Provider, MD  dicyclomine (BENTYL) 20 MG tablet Take 20 mg by mouth every 6 (six) hours as needed for spasms.    Yes Historical Provider, MD  docusate sodium (COLACE) 100 MG capsule Take 200 mg by mouth 2 (two) times daily as needed for mild constipation.  Yes Historical Provider, MD  fluticasone (FLONASE) 50 MCG/ACT nasal spray Place 2 sprays into both nostrils daily as needed for rhinitis.    Yes Historical Provider, MD  levofloxacin (LEVAQUIN) 500 MG tablet Take 500 mg by mouth daily. 05/12/15 05/22/15 Yes Historical Provider, MD  loperamide (IMODIUM) 2 MG capsule Take 2 mg by mouth as needed for diarrhea or loose stools.   Yes Historical Provider, MD  OLANZapine (ZYPREXA) 5 MG tablet Take 2.5 mg by mouth every 6  (six) hours as needed (for GI upset).    Yes Historical Provider, MD  omeprazole (PRILOSEC) 20 MG capsule Take 20 mg by mouth 2 (two) times daily.    Yes Historical Provider, MD  ondansetron (ZOFRAN) 8 MG tablet Take 8 mg by mouth every 6 (six) hours as needed for nausea or vomiting.   Yes Historical Provider, MD  ondansetron (ZOFRAN-ODT) 4 MG disintegrating tablet Take 4 mg by mouth every 8 (eight) hours as needed for nausea or vomiting.   Yes Historical Provider, MD  oxyCODONE (OXY IR/ROXICODONE) 5 MG immediate release tablet Take 2 tablets (10 mg total) by mouth every 4 (four) hours as needed for severe pain. 02/26/15  Yes Hulen Luster, MD  promethazine (PHENERGAN) 25 MG suppository Place 1 suppository (25 mg total) rectally every 6 (six) hours as needed for nausea or vomiting. 04/25/15 04/24/16 Yes Loney Hering, MD  Rivaroxaban (XARELTO) 15 MG TABS tablet Take 15 mg by mouth 2 (two) times daily.   Yes Historical Provider, MD  senna (SENOKOT) 8.6 MG TABS tablet Take 2 tablets by mouth 2 (two) times daily as needed for mild constipation.   Yes Historical Provider, MD  sucralfate (CARAFATE) 1 G tablet Take 1 g by mouth 3 (three) times daily as needed (for GI upset).    Yes Historical Provider, MD  SUMAtriptan (IMITREX) 50 MG tablet Take 50 mg by mouth every 2 (two) hours as needed for migraine.    Yes Historical Provider, MD  traZODone (DESYREL) 50 MG tablet Take 50 mg by mouth at bedtime.   Yes Historical Provider, MD  urea (CARMOL) 20 % cream Apply 1 application topically as needed (for dry skin on feet).    Yes Historical Provider, MD  fentaNYL (DURAGESIC - DOSED MCG/HR) 50 MCG/HR Place 50 mcg onto the skin every 3 (three) days.    Historical Provider, MD      PHYSICAL EXAMINATION:   VITAL SIGNS: Blood pressure 132/91, pulse 103, temperature 98.7 F (37.1 C), temperature source Oral, resp. rate 20.  GENERAL:  59 y.o.-year-old patient lying in the bed with no acute distress. Appears little  dehydrated EYES: Pupils equal, round, reactive to light and accommodation. No scleral icterus. Extraocular muscles intact.  HEENT: Head atraumatic, normocephalic. Oropharynx and nasopharynx clear.  NECK:  Supple, no jugular venous distention. No thyroid enlargement, no tenderness.  LUNGS: Rhonchus breath sounds in the right lung.  CARDIOVASCULAR: S1, S2 normal. No murmurs, rubs, or gallops.  ABDOMEN: Soft, nontender, nondistended. Bowel sounds present. No organomegaly or mass.  EXTREMITIES: No pedal edema, cyanosis, or clubbing.  NEUROLOGIC: Cranial nerves II through XII are intact. Muscle strength 5/5 in all extremities. Sensation intact. Gait not checked.  PSYCHIATRIC: The patient is alert and oriented x 3.  SKIN: No obvious rash, lesion, or ulcer.   LABORATORY PANEL:   CBC No results for input(s): WBC, HGB, HCT, PLT, MCV, MCH, MCHC, RDW, LYMPHSABS, MONOABS, EOSABS, BASOSABS, BANDABS in the last 168 hours.  Invalid input(s): NEUTRABS,  BANDSABD ------------------------------------------------------------------------------------------------------------------  Chemistries  No results for input(s): NA, K, CL, CO2, GLUCOSE, BUN, CREATININE, CALCIUM, MG, AST, ALT, ALKPHOS, BILITOT in the last 168 hours.  Invalid input(s): GFRCGP ------------------------------------------------------------------------------------------------------------------ CrCl cannot be calculated (Unknown ideal weight.). ------------------------------------------------------------------------------------------------------------------ No results for input(s): TSH, T4TOTAL, T3FREE, THYROIDAB in the last 72 hours.  Invalid input(s): FREET3   Coagulation profile No results for input(s): INR, PROTIME in the last 168 hours. ------------------------------------------------------------------------------------------------------------------- No results for input(s): DDIMER in the last 72  hours. -------------------------------------------------------------------------------------------------------------------  Cardiac Enzymes No results for input(s): CKMB, TROPONINI, MYOGLOBIN in the last 168 hours.  Invalid input(s): CK ------------------------------------------------------------------------------------------------------------------ Invalid input(s): POCBNP  ---------------------------------------------------------------------------------------------------------------  Urinalysis    Component Value Date/Time   COLORURINE COLORLESS* 04/25/2015 0513   COLORURINE Yellow 11/27/2014 1918   APPEARANCEUR CLEAR* 04/25/2015 0513   APPEARANCEUR Clear 11/27/2014 1918   LABSPEC 1.014 04/25/2015 0513   LABSPEC 1.017 11/27/2014 1918   PHURINE 7.0 04/25/2015 0513   PHURINE 7.0 11/27/2014 1918   GLUCOSEU NEGATIVE 04/25/2015 0513   GLUCOSEU Negative 11/27/2014 1918   HGBUR NEGATIVE 04/25/2015 0513   HGBUR Negative 11/27/2014 1918   BILIRUBINUR NEGATIVE 04/25/2015 0513   BILIRUBINUR Negative 11/27/2014 1918   KETONESUR NEGATIVE 04/25/2015 0513   KETONESUR Negative 11/27/2014 1918   PROTEINUR NEGATIVE 04/25/2015 0513   PROTEINUR Negative 11/27/2014 1918   NITRITE NEGATIVE 04/25/2015 0513   NITRITE Negative 11/27/2014 1918   LEUKOCYTESUR NEGATIVE 04/25/2015 0513   LEUKOCYTESUR Negative 11/27/2014 1918     RADIOLOGY: No results found.  EKG: Orders placed or performed during the hospital encounter of 05/04/15  . EKG 12-Lead  . EKG 12-Lead  . EKG 12-Lead  . EKG 12-Lead    IMPRESSION AND PLAN: Patient is a 59 year old with mental cell cancer with recent diagnosis of pneumonia and now presents with diarrhea  1, diarrhea: With recent anabiotic so we'll rule C. difficile out. Check stool cultures, we'll provide him with IV fluids  2. Recent pneumonia: Patient still symptomatic will repeat chest x-ray Place him on ceftriaxone azithromycin if he has C. difficile positive  and chest x-ray is negative. Anabiotic's  3. COPD continue Symbicort I'll add nebulizers as needed  4. History of DVT continue Xarelto 15 mg twice a day  Miscellaneous DVT prophylaxis patient already on Xarelto All the records are reviewed and case discussed with ED provider. Management plans discussed with the patient, family and they are in agreement.  CODE STATUS: Full    Code Status Orders        Start     Ordered   05/18/15 1328  Full code   Continuous     05/18/15 1329    Advance Directive Documentation        Most Recent Value   Type of Advance Directive  Healthcare Power of Attorney, Living will   Pre-existing out of facility DNR order (yellow form or pink MOST form)     "MOST" Form in Place?         TOTAL TIME TAKING CARE OF THIS PATIENT: 55 minutes.    Dustin Flock M.D on 05/18/2015 at 1:43 PM  Between 7am to 6pm - Pager - [phone]  After 6pm go to [email or web address] - password [email or web address] [place] [facility]  Office  [phone]  CC: Primary care physician; Perrin Maltese, MD

## 2015-05-19 ENCOUNTER — Inpatient Hospital Stay: Payer: Medicaid Other

## 2015-05-19 LAB — BASIC METABOLIC PANEL
ANION GAP: 5 (ref 5–15)
BUN: 12 mg/dL (ref 6–20)
CALCIUM: 8 mg/dL — AB (ref 8.9–10.3)
CO2: 25 mmol/L (ref 22–32)
CREATININE: 0.77 mg/dL (ref 0.61–1.24)
Chloride: 111 mmol/L (ref 101–111)
Glucose, Bld: 106 mg/dL — ABNORMAL HIGH (ref 65–99)
Potassium: 3.4 mmol/L — ABNORMAL LOW (ref 3.5–5.1)
SODIUM: 141 mmol/L (ref 135–145)

## 2015-05-19 LAB — CBC
HCT: 36.8 % — ABNORMAL LOW (ref 40.0–52.0)
Hemoglobin: 12.7 g/dL — ABNORMAL LOW (ref 13.0–18.0)
MCH: 30.9 pg (ref 26.0–34.0)
MCHC: 34.5 g/dL (ref 32.0–36.0)
MCV: 89.5 fL (ref 80.0–100.0)
Platelets: 178 10*3/uL (ref 150–440)
RBC: 4.11 MIL/uL — ABNORMAL LOW (ref 4.40–5.90)
RDW: 13.5 % (ref 11.5–14.5)
WBC: 6.3 10*3/uL (ref 3.8–10.6)

## 2015-05-19 MED ORDER — OXYCODONE HCL 10 MG PO TABS: 10.0000 mg | ORAL_TABLET | ORAL | Status: DC | PRN

## 2015-05-19 NOTE — Plan of Care (Signed)
Problem: Discharge Progression Outcomes Goal: Barriers To Progression Addressed/Resolved Individualization: From home lives with wife, uses cane or walker for ambulation, veteran goes to [place] hospital, history of HTN, COPD, mantle cell lymphoma controlled by home meds  Outcome: Progressing Individualization of Care Pt prefers to be called Alexander [name] Hx of mantle cell lymphoma, COPD, hypertension, migraine, asthma, BPH    Goal: Other Discharge Outcomes/Goals Pain:  Patient with complaints of pain this shift.  Pain controlled with medication and patient able to rest. Hemodynamically:  Patient afebrile and VSS this shift. IV fluids at 100 mL/hr.  BP 120/53 mmHg  Pulse 74  Temp(Src) 98.4 F (36.9 C) (Oral)  Resp 20  Ht 5\' 7"  (1.702 m)  Wt 185 lb 1.6 oz (83.961 kg)  BMI 28.98 kg/m2  SpO2 02% Complications:  Continuing to need stool sample.  Patient did not stool this shift.   Diet:  Patient on regular diet.  Able to take medications whole with water. Activity:  Patient resting in bed this shift.  He stated that rest was most important to him tonight.

## 2015-05-19 NOTE — Discharge Summary (Signed)
[facility] [place] NAME: Alexander [name]    MR#:  [id number]  DATE OF BIRTH:  1955/08/28  DATE OF ADMISSION:  05/18/2015 ADMITTING PHYSICIAN: Dustin Flock, MD  DATE OF DISCHARGE: 05/19/2015  PRIMARY CARE PHYSICIAN: Perrin Maltese, MD    ADMISSION DIAGNOSIS:  Pneumonia Dehydration  DISCHARGE DIAGNOSIS:  Active Problems:   Diarrhea   SECONDARY DIAGNOSIS:   Past Medical History  Diagnosis Date  . Mantle cell lymphoma   . COPD (chronic obstructive pulmonary disease)   . Hypertension   . Migraine   . Asthma   . BPH (benign prostatic hyperplasia)   . Deviated nasal septum   . DVT (deep venous thrombosis)     HOSPITAL COURSE:  This is 59 year old male with history of mantle cell lymphoma, COPD and BPH who was admitted for diarrhea. For further details please refer the H&P.  1. Diarrhea: Patient's diarrhea has subsided. It is thought that that his diarrhea was secondary to the Levaquin that the patient was on. He reports that he has taken Levaquin in the past and has had diarrhea.  2. Community-acquired pneumonia: Patient was being treated for pneumonia as an outpatient. He was continued on antibiotics while in the hospital. Patient did want to continue with Levaquin and take loperamide for his diarrhea. Chest x-ray did not show evidence of pneumonia however he was recently treated and should continue the antibiotics until completed.  3. COPD: Patient had no signs of exacerbation. Patient will continue on his inhalers.  4. History of DVT: Patient is on Xarelto which was continued.   DISCHARGE CONDITIONS AND DIET:  Patient is being discharged home in stable condition on a regular diet  CONSULTS OBTAINED:     DRUG ALLERGIES:   Allergies  Allergen Reactions  . Ativan [Lorazepam] Other (See Comments)    Reaction:  Dizziness   . Bee Venom     DISCHARGE MEDICATIONS:   Current Discharge Medication List    CONTINUE  these medications which have CHANGED   Details  !! oxyCODONE 10 MG TABS Take 1 tablet (10 mg total) by mouth every 4 (four) hours as needed for severe pain. Qty: 15 tablet, Refills: 0     !! - Potential duplicate medications found. Please discuss with provider.    CONTINUE these medications which have NOT CHANGED   Details  budesonide-formoterol (SYMBICORT) 80-4.5 MCG/ACT inhaler Inhale 2 puffs into the lungs 2 (two) times daily as needed (for shortness of breath).     dicyclomine (BENTYL) 20 MG tablet Take 20 mg by mouth every 6 (six) hours as needed for spasms.     docusate sodium (COLACE) 100 MG capsule Take 200 mg by mouth 2 (two) times daily as needed for mild constipation.    fluticasone (FLONASE) 50 MCG/ACT nasal spray Place 2 sprays into both nostrils daily as needed for rhinitis.     levofloxacin (LEVAQUIN) 500 MG tablet Take 500 mg by mouth daily.    loperamide (IMODIUM) 2 MG capsule Take 2 mg by mouth as needed for diarrhea or loose stools.    OLANZapine (ZYPREXA) 5 MG tablet Take 2.5 mg by mouth every 6 (six) hours as needed (for GI upset).     omeprazole (PRILOSEC) 20 MG capsule Take 20 mg by mouth 2 (two) times daily.     ondansetron (ZOFRAN) 8 MG tablet Take 8 mg by mouth every 6 (six) hours as needed for nausea or vomiting.    ondansetron (ZOFRAN-ODT)  4 MG disintegrating tablet Take 4 mg by mouth every 8 (eight) hours as needed for nausea or vomiting.    !! oxyCODONE (OXY IR/ROXICODONE) 5 MG immediate release tablet Take 2 tablets (10 mg total) by mouth every 4 (four) hours as needed for severe pain. Qty: 30 tablet, Refills: 0    promethazine (PHENERGAN) 25 MG suppository Place 1 suppository (25 mg total) rectally every 6 (six) hours as needed for nausea or vomiting. Qty: 12 suppository, Refills: 0    Rivaroxaban (XARELTO) 15 MG TABS tablet Take 15 mg by mouth 2 (two) times daily.    senna (SENOKOT) 8.6 MG TABS tablet Take 2 tablets by mouth 2 (two) times daily as  needed for mild constipation.    sucralfate (CARAFATE) 1 G tablet Take 1 g by mouth 3 (three) times daily as needed (for GI upset).     SUMAtriptan (IMITREX) 50 MG tablet Take 50 mg by mouth every 2 (two) hours as needed for migraine.     traZODone (DESYREL) 50 MG tablet Take 50 mg by mouth at bedtime.    urea (CARMOL) 20 % cream Apply 1 application topically as needed (for dry skin on feet).     fentaNYL (DURAGESIC - DOSED MCG/HR) 50 MCG/HR Place 50 mcg onto the skin every 3 (three) days.     !! - Potential duplicate medications found. Please discuss with provider.            Today   CHIEF COMPLAINT:  Patient is doing well this morning. His diarrhea has resolved. He denies weakness, cough or shortness of breath.   VITAL SIGNS:  Blood pressure 113/52, pulse 58, temperature 98.4 F (36.9 C), temperature source Oral, resp. rate 20, height 5\' 7"  (1.702 m), weight 83.961 kg (185 lb 1.6 oz), SpO2 96 %.   REVIEW OF SYSTEMS:  Review of Systems  Constitutional: Negative for fever, chills and malaise/fatigue.  HENT: Negative for sore throat.   Eyes: Negative for blurred vision.  Respiratory: Negative for cough, hemoptysis, shortness of breath and wheezing.   Cardiovascular: Negative for chest pain, palpitations and leg swelling.  Gastrointestinal: Negative for nausea, vomiting, abdominal pain, diarrhea and blood in stool.  Genitourinary: Negative for dysuria.  Musculoskeletal: Negative for back pain.  Neurological: Negative for dizziness, tremors and headaches.  Endo/Heme/Allergies: Does not bruise/bleed easily.     PHYSICAL EXAMINATION:  GENERAL:  59 y.o.-year-old patient lying in the bed with no acute distress.  NECK:  Supple, no jugular venous distention. No thyroid enlargement, no tenderness.  LUNGS: Normal breath sounds bilaterally, no wheezing, rales,rhonchi  No use of accessory muscles of respiration.  CARDIOVASCULAR: S1, S2 normal. No murmurs, rubs, or gallops.   ABDOMEN: Soft, non-tender, non-distended. Bowel sounds present. No organomegaly or mass.  EXTREMITIES: No pedal edema, cyanosis, or clubbing.  PSYCHIATRIC: The patient is alert and oriented x 3.  SKIN: No obvious rash, lesion, or ulcer.   DATA REVIEW:   CBC  Recent Labs Lab 05/19/15 0440  WBC 6.3  HGB 12.7*  HCT 36.8*  PLT 178    Chemistries   Recent Labs Lab 05/18/15 1630 05/19/15 0440  NA 136 141  K 3.7 3.4*  CL 104 111  CO2 26 25  GLUCOSE 121* 106*  BUN 15 12  CREATININE 0.94 0.77  CALCIUM 8.3* 8.0*  AST 19  --   ALT 8*  --   ALKPHOS 90  --   BILITOT 0.6  --     Cardiac Enzymes No results  for input(s): TROPONINI in the last 168 hours.  Microbiology Results  @MICRORSLT48 @  RADIOLOGY:  Dg Chest 2 View  05/19/2015   CLINICAL DATA:  Shortness of breath.  EXAM: CHEST  2 VIEW  COMPARISON:  November 26, 2014.  FINDINGS: The heart size and mediastinal contours are within normal limits. No pneumothorax or pleural effusion is noted. Stable calcified granuloma is noted in right upper lobe. No acute pulmonary disease is noted. The visualized skeletal structures are unremarkable.  IMPRESSION: No active cardiopulmonary disease.   Electronically Signed   By: Marijo Conception, M.D.   On: 05/19/2015 09:39      Management plans discussed with the patient and he is in agreement. Stable for discharge home  Patient should follow up with PCP in one week  CODE STATUS:     Code Status Orders        Start     Ordered   05/18/15 1328  Full code   Continuous     05/18/15 1329    Advance Directive Documentation        Most Recent Value   Type of Advance Directive  Healthcare Power of Attorney, Living will   Pre-existing out of facility DNR order (yellow form or pink MOST form)     "MOST" Form in Place?        TOTAL TIME TAKING CARE OF THIS PATIENT: 35 minutes.    MODY, SITAL M.D on 05/19/2015 at 11:35 AM  Between 7am to 6pm - Pager - [phone] After 6pm go to  [email or web address] - password [email or web address] [facility]  Office  [phone]  CC: Primary care physician; Perrin Maltese, MD

## 2015-05-19 NOTE — Progress Notes (Signed)
Patient being discharged home per MD order. VSS. IV removed and in tact. Prescription given to patient . All discharge instructions given to patient and all questions answered.

## 2015-05-23 MED ORDER — RIVAROXABAN 20 MG PO TABS: 20.0000 mg | ORAL_TABLET | Freq: Every day | ORAL | Status: DC

## 2015-05-23 NOTE — Progress Notes (Signed)
I spoke with patient he has been on XARELTO 20 mg once a day since his EGD procedure in September.

## 2015-05-24 LAB — CULTURE, BLOOD (ROUTINE X 2)
CULTURE: NO GROWTH
Culture: NO GROWTH

## 2015-07-04 ENCOUNTER — Emergency Department: Payer: Medicaid Other

## 2015-07-04 ENCOUNTER — Emergency Department
Admission: EM | Admit: 2015-07-04 | Discharge: 2015-07-04 | Disposition: A | Discharge status: Home / Self Care | Payer: Medicaid Other | Attending: Emergency Medicine | Admitting: Emergency Medicine

## 2015-07-04 ENCOUNTER — Encounter: Payer: Self-pay | Admitting: *Deleted

## 2015-07-04 DIAGNOSIS — Z87891 Personal history of nicotine dependence: Secondary | ICD-10-CM | POA: Diagnosis not present

## 2015-07-04 DIAGNOSIS — I10 Essential (primary) hypertension: Secondary | ICD-10-CM | POA: Diagnosis not present

## 2015-07-04 DIAGNOSIS — R197 Diarrhea, unspecified: Secondary | ICD-10-CM | POA: Insufficient documentation

## 2015-07-04 DIAGNOSIS — R112 Nausea with vomiting, unspecified: Secondary | ICD-10-CM | POA: Insufficient documentation

## 2015-07-04 DIAGNOSIS — Z79891 Long term (current) use of opiate analgesic: Secondary | ICD-10-CM | POA: Diagnosis not present

## 2015-07-04 DIAGNOSIS — Z79899 Other long term (current) drug therapy: Secondary | ICD-10-CM | POA: Insufficient documentation

## 2015-07-04 DIAGNOSIS — Z7902 Long term (current) use of antithrombotics/antiplatelets: Secondary | ICD-10-CM | POA: Insufficient documentation

## 2015-07-04 DIAGNOSIS — R1084 Generalized abdominal pain: Secondary | ICD-10-CM | POA: Diagnosis not present

## 2015-07-04 LAB — CBC
HEMATOCRIT: 46.1 % (ref 40.0–52.0)
Hemoglobin: 16.1 g/dL (ref 13.0–18.0)
MCH: 30.7 pg (ref 26.0–34.0)
MCHC: 35 g/dL (ref 32.0–36.0)
MCV: 87.6 fL (ref 80.0–100.0)
PLATELETS: 208 10*3/uL (ref 150–440)
RBC: 5.26 MIL/uL (ref 4.40–5.90)
RDW: 13.6 % (ref 11.5–14.5)
WBC: 8.4 10*3/uL (ref 3.8–10.6)

## 2015-07-04 LAB — COMPREHENSIVE METABOLIC PANEL
ALBUMIN: 5.1 g/dL — AB (ref 3.5–5.0)
ALT: 12 U/L — AB (ref 17–63)
AST: 17 U/L (ref 15–41)
Alkaline Phosphatase: 96 U/L (ref 38–126)
Anion gap: 10 (ref 5–15)
BUN: 17 mg/dL (ref 6–20)
CHLORIDE: 102 mmol/L (ref 101–111)
CO2: 28 mmol/L (ref 22–32)
CREATININE: 0.84 mg/dL (ref 0.61–1.24)
Calcium: 10.1 mg/dL (ref 8.9–10.3)
GFR calc Af Amer: >60 mL/min (ref >60)
GLUCOSE: 124 mg/dL — AB (ref 65–99)
POTASSIUM: 3.4 mmol/L — AB (ref 3.5–5.1)
SODIUM: 140 mmol/L (ref 135–145)
Total Bilirubin: 1.3 mg/dL — ABNORMAL HIGH (ref 0.3–1.2)
Total Protein: 8.6 g/dL — ABNORMAL HIGH (ref 6.5–8.1)

## 2015-07-04 LAB — URINALYSIS COMPLETE WITH MICROSCOPIC (ARMC ONLY)
BACTERIA UA: NONE SEEN
BILIRUBIN URINE: NEGATIVE
GLUCOSE, UA: NEGATIVE mg/dL
HGB URINE DIPSTICK: NEGATIVE
LEUKOCYTES UA: NEGATIVE
Nitrite: NEGATIVE
Protein, ur: 30 mg/dL — AB
SQUAMOUS EPITHELIAL / LPF: NONE SEEN
Specific Gravity, Urine: 1.03 (ref 1.005–1.030)
WBC, UA: NONE SEEN WBC/hpf (ref 0–5)
pH: 5 (ref 5.0–8.0)

## 2015-07-04 LAB — LIPASE, BLOOD: LIPASE: 21 U/L (ref 11–51)

## 2015-07-04 MED ORDER — HYDROMORPHONE HCL 1 MG/ML IJ SOLN
1.0000 mg | Freq: Once | INTRAMUSCULAR | Status: AC
  Administered 2015-07-04: 1 mg via INTRAVENOUS

## 2015-07-04 MED ORDER — ONDANSETRON HCL 4 MG/2ML IJ SOLN
INTRAMUSCULAR | Status: AC
  Administered 2015-07-04: 4 mg via INTRAVENOUS
  Filled 2015-07-04: qty 2

## 2015-07-04 MED ORDER — PROMETHAZINE HCL 25 MG/ML IJ SOLN
25.0000 mg | Freq: Once | INTRAMUSCULAR | Status: AC
  Administered 2015-07-04: 25 mg via INTRAVENOUS
  Filled 2015-07-04: qty 1

## 2015-07-04 MED ORDER — IOHEXOL 300 MG/ML  SOLN
100.0000 mL | Freq: Once | INTRAMUSCULAR | Status: AC | PRN
  Administered 2015-07-04: 100 mL via INTRAVENOUS

## 2015-07-04 MED ORDER — MORPHINE SULFATE (PF) 4 MG/ML IV SOLN
INTRAVENOUS | Status: AC
  Administered 2015-07-04: 4 mg via INTRAVENOUS
  Filled 2015-07-04: qty 1

## 2015-07-04 MED ORDER — ONDANSETRON 4 MG PO TBDP: 4.0000 mg | ORAL_TABLET | Freq: Three times a day (TID) | ORAL | Status: DC | PRN

## 2015-07-04 MED ORDER — HYDROMORPHONE HCL 1 MG/ML IJ SOLN
INTRAMUSCULAR | Status: AC
  Administered 2015-07-04: 1 mg via INTRAVENOUS
  Filled 2015-07-04: qty 1

## 2015-07-04 MED ORDER — MORPHINE SULFATE (PF) 4 MG/ML IV SOLN
4.0000 mg | Freq: Once | INTRAVENOUS | Status: AC
  Administered 2015-07-04: 4 mg via INTRAVENOUS
  Filled 2015-07-04: qty 1

## 2015-07-04 MED ORDER — ONDANSETRON HCL 4 MG/2ML IJ SOLN
4.0000 mg | Freq: Once | INTRAMUSCULAR | Status: AC
  Administered 2015-07-04: 4 mg via INTRAVENOUS

## 2015-07-04 MED ORDER — IOHEXOL 240 MG/ML SOLN
25.0000 mL | Freq: Once | INTRAMUSCULAR | Status: AC | PRN
  Administered 2015-07-04: 25 mL via ORAL

## 2015-07-04 MED ORDER — SODIUM CHLORIDE 0.9 % IV BOLUS (SEPSIS)
1000.0000 mL | Freq: Once | INTRAVENOUS | Status: AC
  Administered 2015-07-04: 1000 mL via INTRAVENOUS

## 2015-07-04 MED ORDER — MORPHINE SULFATE (PF) 4 MG/ML IV SOLN
4.0000 mg | Freq: Once | INTRAVENOUS | Status: AC
  Administered 2015-07-04: 4 mg via INTRAVENOUS

## 2015-07-04 MED ORDER — ONDANSETRON HCL 4 MG/2ML IJ SOLN
4.0000 mg | Freq: Once | INTRAMUSCULAR | Status: AC | PRN
  Administered 2015-07-04: 4 mg via INTRAVENOUS

## 2015-07-04 NOTE — Discharge Instructions (Signed)
As we discussed please follow up with your primary care physician in the next 1-2 days. Return to the emergency department For any worsening nausea/vomiting, abdominal pain, fever, or any other symptom personally concerning to yourself.   Abdominal Pain, Adult Many things can cause belly (abdominal) pain. Most times, the belly pain is not dangerous. Many cases of belly pain can be watched and treated at home. HOME CARE   Do not take medicines that help you go poop (laxatives) unless told to by your doctor.  Only take medicine as told by your doctor.  Eat or drink as told by your doctor. Your doctor will tell you if you should be on a special diet. GET HELP IF:  You do not know what is causing your belly pain.  You have belly pain while you are sick to your stomach (nauseous) or have runny poop (diarrhea).  You have pain while you pee or poop.  Your belly pain wakes you up at night.  You have belly pain that gets worse or better when you eat.  You have belly pain that gets worse when you eat fatty foods.  You have a fever. GET HELP RIGHT AWAY IF:   The pain does not go away within 2 hours.  You keep throwing up (vomiting).  The pain changes and is only in the right or left part of the belly.  You have bloody or tarry looking poop. MAKE SURE YOU:   Understand these instructions.  Will watch your condition.  Will get help right away if you are not doing well or get worse.   This information is not intended to replace advice given to you by your health care provider. Make sure you discuss any questions you have with your health care provider.   Document Released: 01/22/2008 Document Revised: 08/26/2014 Document Reviewed: 04/14/2013 Elsevier Interactive Patient Education 2016 Elsevier Inc[employer]  Nausea and Vomiting Nausea means you feel sick to your stomach. Throwing up (vomiting) is a reflex where stomach contents come out of your mouth. HOME CARE   Take medicine as told  by your doctor.  Do not force yourself to eat. However, you do need to drink fluids.  If you feel like eating, eat a normal diet as told by your doctor.  Eat rice, wheat, potatoes, bread, lean meats, yogurt, fruits, and vegetables.  Avoid high-fat foods.  Drink enough fluids to keep your pee (urine) clear or pale yellow.  Ask your doctor how to replace body fluid losses (rehydrate). Signs of body fluid loss (dehydration) include:  Feeling very thirsty.  Dry lips and mouth.  Feeling dizzy.  Dark pee.  Peeing less than normal.  Feeling confused.  Fast breathing or heart rate. GET HELP RIGHT AWAY IF:   You have blood in your throw up.  You have black or bloody poop (stool).  You have a bad headache or stiff neck.  You feel confused.  You have bad belly (abdominal) pain.  You have chest pain or trouble breathing.  You do not pee at least once every 8 hours.  You have cold, clammy skin.  You keep throwing up after 24 to 48 hours.  You have a fever. MAKE SURE YOU:   Understand these instructions.  Will watch your condition.  Will get help right away if you are not doing well or get worse.   This information is not intended to replace advice given to you by your health care provider. Make sure you discuss any questions  you have with your health care provider.   Document Released: 01/22/2008 Document Revised: 10/28/2011 Document Reviewed: 01/04/2011 Elsevier Interactive Patient Education [employer].

## 2015-07-04 NOTE — ED Notes (Signed)
Past 3 days c/o n/v, diarrhea yesterday, unable to take nausea meds, mid abd pain

## 2015-07-04 NOTE — ED Provider Notes (Signed)
[facility] Emergency Department Provider Note  Time seen: 12:26 PM  I have reviewed the triage vital signs and the nursing notes.   HISTORY  Chief Complaint Emesis    HPI Alexander [name] is a 59 y.o. male the past medical history Mantle cell lymphoma, COPD, hypertension, asthma, DVT who presents the emergency department with nausea, vomiting, diarrhea 3 days. According to the patient since being diagnosed with lymphoma he will occasionally develop similar symptoms of abdominal pain, nausea, vomiting, diarrhea. He states this feels typical to the episode she is experienced in the past however has nausea and vomiting have worsened to the point where he is no longer able to keep down fluids, pain medication or nausea medication. States diffuse moderate abdominal pain. Worse with vomiting.Denies fever.     Past Medical History  Diagnosis Date  . Mantle cell lymphoma ([place])   . COPD (chronic obstructive pulmonary disease) ([place])   . Hypertension   . Migraine   . Asthma   . BPH (benign prostatic hyperplasia)   . Deviated nasal septum   . DVT (deep venous thrombosis) [facility])     Patient Active Problem List   Diagnosis Date Noted  . Diarrhea 05/18/2015  . DEVIATED SEPTUM 05/18/2007  . ALLERGIC RHINITIS 05/18/2007  . ASTHMA 05/18/2007    Past Surgical History  Procedure Laterality Date  . Throat surgery    . Barryx N/A   . Esophagogastroduodenoscopy (egd) with propofol N/A 02/23/2015    Procedure: ESOPHAGOGASTRODUODENOSCOPY (EGD) WITH PROPOFOL;  Surgeon: Hulen Luster, MD;  Location: [facility] ENDOSCOPY;  Service: Gastroenterology;  Laterality: N/A;  . Esophagogastroduodenoscopy (egd) with propofol N/A 05/04/2015    Procedure: ESOPHAGOGASTRODUODENOSCOPY (EGD) WITH PROPOFOL;  Surgeon: Hulen Luster, MD;  Location: [facility] ENDOSCOPY;  Service: Gastroenterology;  Laterality: N/A;  . Tonsillectomy      Current Outpatient Rx  Name  Route  Sig  Dispense  Refill  .  budesonide-formoterol (SYMBICORT) 80-4.5 MCG/ACT inhaler   Inhalation   Inhale 2 puffs into the lungs 2 (two) times daily as needed (for shortness of breath).          . dicyclomine (BENTYL) 20 MG tablet   Oral   Take 20 mg by mouth every 6 (six) hours as needed for spasms.          Marland Kitchen docusate sodium (COLACE) 100 MG capsule   Oral   Take 200 mg by mouth 2 (two) times daily as needed for mild constipation.         . fentaNYL (DURAGESIC - DOSED MCG/HR) 50 MCG/HR   Transdermal   Place 50 mcg onto the skin every 3 (three) days.         . fluticasone (FLONASE) 50 MCG/ACT nasal spray   Each Nare   Place 2 sprays into both nostrils daily as needed for rhinitis.          Marland Kitchen loperamide (IMODIUM) 2 MG capsule   Oral   Take 2 mg by mouth as needed for diarrhea or loose stools.         Marland Kitchen OLANZapine (ZYPREXA) 5 MG tablet   Oral   Take 2.5 mg by mouth every 6 (six) hours as needed (for GI upset).          Marland Kitchen omeprazole (PRILOSEC) 20 MG capsule   Oral   Take 20 mg by mouth 2 (two) times daily.          . ondansetron (ZOFRAN) 8 MG tablet  Oral   Take 8 mg by mouth every 6 (six) hours as needed for nausea or vomiting.         . ondansetron (ZOFRAN-ODT) 4 MG disintegrating tablet   Oral   Take 4 mg by mouth every 8 (eight) hours as needed for nausea or vomiting.         Marland Kitchen oxyCODONE (OXY IR/ROXICODONE) 5 MG immediate release tablet   Oral   Take 2 tablets (10 mg total) by mouth every 4 (four) hours as needed for severe pain.   30 tablet   0   . oxyCODONE 10 MG TABS   Oral   Take 1 tablet (10 mg total) by mouth every 4 (four) hours as needed for severe pain.   15 tablet   0   . promethazine (PHENERGAN) 25 MG suppository   Rectal   Place 1 suppository (25 mg total) rectally every 6 (six) hours as needed for nausea or vomiting.   12 suppository   0   . Rivaroxaban (XARELTO) 15 MG TABS tablet   Oral   Take 15 mg by mouth 2 (two) times daily.         .  rivaroxaban (XARELTO) 20 MG TABS tablet   Oral   Take 1 tablet (20 mg total) by mouth daily with supper.   30 tablet   0   . senna (SENOKOT) 8.6 MG TABS tablet   Oral   Take 2 tablets by mouth 2 (two) times daily as needed for mild constipation.         . sucralfate (CARAFATE) 1 G tablet   Oral   Take 1 g by mouth 3 (three) times daily as needed (for GI upset).          . SUMAtriptan (IMITREX) 50 MG tablet   Oral   Take 50 mg by mouth every 2 (two) hours as needed for migraine.          . traZODone (DESYREL) 50 MG tablet   Oral   Take 50 mg by mouth at bedtime.         . urea (CARMOL) 20 % cream   Topical   Apply 1 application topically as needed (for dry skin on feet).            Allergies Ativan and Bee venom  Family History  Problem Relation Age of Onset  . CAD    . Diabetes      Social History Social History  Substance Use Topics  . Smoking status: Former Research scientist (life sciences)  . Smokeless tobacco: None  . Alcohol Use: No    Review of Systems Constitutional: Negative for fever. Cardiovascular: Negative for chest pain. Respiratory: Negative for shortness of breath. Gastrointestinal: Positive for diffuse mild-to-moderate abdominal pain, nausea, vomiting, diarrhea Genitourinary: Negative for dysuria. Skin: Negative for rash. Neurological: Negative for headache 10-point ROS otherwise negative.  ____________________________________________   PHYSICAL EXAM:  VITAL SIGNS: ED Triage Vitals  Enc Vitals Group     BP 07/04/15 1142 144/84 mmHg     Pulse Rate 07/04/15 1142 70     Resp 07/04/15 1142 20     Temp 07/04/15 1142 98.7 F (37.1 C)     Temp Source 07/04/15 1142 Oral     SpO2 07/04/15 1142 97 %     Weight 07/04/15 1142 186 lb (84.369 kg)     Height 07/04/15 1142 5\' 10"  (1.778 m)     Head Cir --      Peak  Flow --      Pain Score 07/04/15 1145 9     Pain Loc --      Pain Edu? --      Excl. in [place]? --     Constitutional: Alert and oriented. Well  appearing and in no distress. Eyes: Normal exam ENT   Head: Normocephalic and atraumatic.   Mouth/Throat: Mucous membranes are moist. Cardiovascular: Normal rate, regular rhythm. No murmur Respiratory: Normal respiratory effort without tachypnea nor retractions. Breath sounds are clear and equal bilaterally. No wheezes/rales/rhonchi. Gastrointestinal: Soft, moderate diffuse abdominal tenderness palpation. No 1 area appears to be more tender than any otherarea. No rebound or guarding. No distention. Musculoskeletal: Nontender with normal range of motion in all extremities Neurologic:  Normal speech and language. No gross focal neurologic deficits Skin:  Skin is warm, dry and intact.  Psychiatric: Mood and affect are normal. Speech and behavior are normal.  ____________________________________________   RADIOLOGY  CT shows no acute abnormality  ____________________________________________    INITIAL IMPRESSION / ASSESSMENT AND PLAN / ED COURSE  Pertinent labs & imaging results that were available during my care of the patient were reviewed by me and considered in my medical decision making (see chart for details).  Patient presents with abdominal pain, nausea, vomiting, diarrhea 3 days. States similar episodes in the past that resolved with IV fluids and nausea medications. We will check labs, given the degree of tenderness to palpation in his abdomen will proceed with a CT abdomen/pelvis to help further evaluate.   CT shows no acute abnormality. Labs are largely within normal limits. Patient states he is feeling better. We will give a second liter of normal saline to hydrate the patient. Patient has tolerated by mouth contrast without any nausea or vomiting. Plan to discharge home. Patient is to return to the emergency department if he has further nausea/vomiting, abdominal pain, fever. ____________________________________________   FINAL CLINICAL IMPRESSION(S) / ED  DIAGNOSES  Abdominal pain Nausea vomiting diarrhea   Harvest Dark, MD 07/04/15 [phone]

## 2015-07-04 NOTE — ED Notes (Signed)
Patient transported to CT 

## 2015-07-16 ENCOUNTER — Emergency Department: Payer: Medicaid Other

## 2015-07-16 ENCOUNTER — Inpatient Hospital Stay
Admission: EM | Admit: 2015-07-16 | Discharge: 2015-07-19 | DRG: 392 | Disposition: A | Discharge status: Home / Self Care | Payer: Medicaid Other | Attending: Internal Medicine | Admitting: Internal Medicine

## 2015-07-16 ENCOUNTER — Encounter: Payer: Self-pay | Admitting: Emergency Medicine

## 2015-07-16 DIAGNOSIS — R1013 Epigastric pain: Secondary | ICD-10-CM

## 2015-07-16 DIAGNOSIS — Z8249 Family history of ischemic heart disease and other diseases of the circulatory system: Secondary | ICD-10-CM

## 2015-07-16 DIAGNOSIS — Z9221 Personal history of antineoplastic chemotherapy: Secondary | ICD-10-CM

## 2015-07-16 DIAGNOSIS — Z888 Allergy status to other drugs, medicaments and biological substances status: Secondary | ICD-10-CM

## 2015-07-16 DIAGNOSIS — I10 Essential (primary) hypertension: Secondary | ICD-10-CM | POA: Diagnosis present

## 2015-07-16 DIAGNOSIS — R109 Unspecified abdominal pain: Secondary | ICD-10-CM | POA: Diagnosis present

## 2015-07-16 DIAGNOSIS — A084 Viral intestinal infection, unspecified: Principal | ICD-10-CM | POA: Diagnosis present

## 2015-07-16 DIAGNOSIS — G43909 Migraine, unspecified, not intractable, without status migrainosus: Secondary | ICD-10-CM | POA: Diagnosis present

## 2015-07-16 DIAGNOSIS — R1110 Vomiting, unspecified: Secondary | ICD-10-CM

## 2015-07-16 DIAGNOSIS — N400 Enlarged prostate without lower urinary tract symptoms: Secondary | ICD-10-CM | POA: Diagnosis present

## 2015-07-16 DIAGNOSIS — J440 Chronic obstructive pulmonary disease with acute lower respiratory infection: Secondary | ICD-10-CM | POA: Diagnosis present

## 2015-07-16 DIAGNOSIS — J45909 Unspecified asthma, uncomplicated: Secondary | ICD-10-CM | POA: Diagnosis present

## 2015-07-16 DIAGNOSIS — Z86718 Personal history of other venous thrombosis and embolism: Secondary | ICD-10-CM

## 2015-07-16 DIAGNOSIS — Z7951 Long term (current) use of inhaled steroids: Secondary | ICD-10-CM

## 2015-07-16 DIAGNOSIS — Z9889 Other specified postprocedural states: Secondary | ICD-10-CM

## 2015-07-16 DIAGNOSIS — E440 Moderate protein-calorie malnutrition: Secondary | ICD-10-CM | POA: Diagnosis present

## 2015-07-16 DIAGNOSIS — Z7901 Long term (current) use of anticoagulants: Secondary | ICD-10-CM

## 2015-07-16 DIAGNOSIS — Z833 Family history of diabetes mellitus: Secondary | ICD-10-CM

## 2015-07-16 DIAGNOSIS — R52 Pain, unspecified: Secondary | ICD-10-CM

## 2015-07-16 DIAGNOSIS — Z79899 Other long term (current) drug therapy: Secondary | ICD-10-CM

## 2015-07-16 DIAGNOSIS — Z8572 Personal history of non-Hodgkin lymphomas: Secondary | ICD-10-CM

## 2015-07-16 DIAGNOSIS — Z803 Family history of malignant neoplasm of breast: Secondary | ICD-10-CM

## 2015-07-16 DIAGNOSIS — Z87891 Personal history of nicotine dependence: Secondary | ICD-10-CM

## 2015-07-16 DIAGNOSIS — R112 Nausea with vomiting, unspecified: Secondary | ICD-10-CM | POA: Diagnosis present

## 2015-07-16 DIAGNOSIS — Z6826 Body mass index (BMI) 26.0-26.9, adult: Secondary | ICD-10-CM

## 2015-07-16 LAB — COMPREHENSIVE METABOLIC PANEL
ALT: 12 U/L — AB (ref 17–63)
AST: 17 U/L (ref 15–41)
Albumin: 3.8 g/dL (ref 3.5–5.0)
Alkaline Phosphatase: 81 U/L (ref 38–126)
Anion gap: 8 (ref 5–15)
BILIRUBIN TOTAL: 0.7 mg/dL (ref 0.3–1.2)
BUN: 9 mg/dL (ref 6–20)
CALCIUM: 8.9 mg/dL (ref 8.9–10.3)
CO2: 31 mmol/L (ref 22–32)
CREATININE: 0.69 mg/dL (ref 0.61–1.24)
Chloride: 100 mmol/L — ABNORMAL LOW (ref 101–111)
GFR calc Af Amer: >60 mL/min (ref >60)
Glucose, Bld: 108 mg/dL — ABNORMAL HIGH (ref 65–99)
Potassium: 3.2 mmol/L — ABNORMAL LOW (ref 3.5–5.1)
Sodium: 139 mmol/L (ref 135–145)
Total Protein: 7.4 g/dL (ref 6.5–8.1)

## 2015-07-16 LAB — CBC WITH DIFFERENTIAL/PLATELET
BASOS ABS: 0 10*3/uL (ref 0–0.1)
Basophils Relative: 1 %
Eosinophils Absolute: 0.1 10*3/uL (ref 0–0.7)
Eosinophils Relative: 1 %
HEMATOCRIT: 38.2 % — AB (ref 40.0–52.0)
Hemoglobin: 13.3 g/dL (ref 13.0–18.0)
LYMPHS ABS: 0.9 10*3/uL — AB (ref 1.0–3.6)
LYMPHS PCT: 21 %
MCH: 30.4 pg (ref 26.0–34.0)
MCHC: 34.9 g/dL (ref 32.0–36.0)
MCV: 87.2 fL (ref 80.0–100.0)
MONO ABS: 0.3 10*3/uL (ref 0.2–1.0)
Monocytes Relative: 7 %
NEUTROS ABS: 3.2 10*3/uL (ref 1.4–6.5)
Neutrophils Relative %: 70 %
Platelets: 220 10*3/uL (ref 150–440)
RBC: 4.38 MIL/uL — AB (ref 4.40–5.90)
RDW: 13.2 % (ref 11.5–14.5)
WBC: 4.6 10*3/uL (ref 3.8–10.6)

## 2015-07-16 LAB — URINALYSIS COMPLETE WITH MICROSCOPIC (ARMC ONLY)
Bacteria, UA: NONE SEEN
GLUCOSE, UA: NEGATIVE mg/dL
Hgb urine dipstick: NEGATIVE
Leukocytes, UA: NEGATIVE
NITRITE: NEGATIVE
PH: 8 (ref 5.0–8.0)
Protein, ur: 30 mg/dL — AB
Specific Gravity, Urine: 1.02 (ref 1.005–1.030)

## 2015-07-16 LAB — LIPASE, BLOOD: LIPASE: 14 U/L (ref 11–51)

## 2015-07-16 MED ORDER — HYDROMORPHONE HCL 1 MG/ML IJ SOLN
INTRAMUSCULAR | Status: AC
  Administered 2015-07-16: 1 mg via INTRAVENOUS
  Filled 2015-07-16: qty 1

## 2015-07-16 MED ORDER — ONDANSETRON HCL 4 MG/2ML IJ SOLN
4.0000 mg | Freq: Once | INTRAMUSCULAR | Status: AC
  Administered 2015-07-16: 4 mg via INTRAVENOUS

## 2015-07-16 MED ORDER — MORPHINE SULFATE (PF) 4 MG/ML IV SOLN
INTRAVENOUS | Status: AC
  Filled 2015-07-16: qty 1

## 2015-07-16 MED ORDER — IOHEXOL 300 MG/ML  SOLN
50.0000 mL | Freq: Once | INTRAMUSCULAR | Status: AC | PRN
  Administered 2015-07-16: 100 mL via INTRAVENOUS

## 2015-07-16 MED ORDER — PROMETHAZINE HCL 25 MG/ML IJ SOLN
INTRAMUSCULAR | Status: AC
  Filled 2015-07-16: qty 1

## 2015-07-16 MED ORDER — ONDANSETRON HCL 4 MG/2ML IJ SOLN
4.0000 mg | Freq: Once | INTRAMUSCULAR | Status: AC
  Administered 2015-07-16: 4 mg via INTRAVENOUS
  Filled 2015-07-16: qty 2

## 2015-07-16 MED ORDER — MORPHINE SULFATE (PF) 4 MG/ML IV SOLN
4.0000 mg | Freq: Once | INTRAVENOUS | Status: AC
  Administered 2015-07-16: 4 mg via INTRAVENOUS
  Filled 2015-07-16: qty 1

## 2015-07-16 MED ORDER — HYDROMORPHONE HCL 1 MG/ML IJ SOLN
1.0000 mg | Freq: Once | INTRAMUSCULAR | Status: AC
  Administered 2015-07-16: 1 mg via INTRAVENOUS

## 2015-07-16 MED ORDER — MORPHINE SULFATE (PF) 4 MG/ML IV SOLN
4.0000 mg | Freq: Once | INTRAVENOUS | Status: AC
  Administered 2015-07-16: 4 mg via INTRAVENOUS

## 2015-07-16 MED ORDER — PROMETHAZINE HCL 25 MG/ML IJ SOLN
25.0000 mg | Freq: Once | INTRAMUSCULAR | Status: AC
  Administered 2015-07-16: 25 mg via INTRAVENOUS

## 2015-07-16 MED ORDER — MORPHINE SULFATE (PF) 4 MG/ML IV SOLN
INTRAVENOUS | Status: AC
  Administered 2015-07-16: 4 mg via INTRAVENOUS
  Filled 2015-07-16: qty 1

## 2015-07-16 MED ORDER — IOHEXOL 240 MG/ML SOLN
25.0000 mL | Freq: Once | INTRAMUSCULAR | Status: AC | PRN
  Administered 2015-07-16: 25 mL via ORAL

## 2015-07-16 MED ORDER — ONDANSETRON HCL 4 MG/2ML IJ SOLN
INTRAMUSCULAR | Status: AC
  Administered 2015-07-16: 4 mg via INTRAVENOUS
  Filled 2015-07-16: qty 2

## 2015-07-16 MED ORDER — SODIUM CHLORIDE 0.9 % IV BOLUS (SEPSIS)
1000.0000 mL | Freq: Once | INTRAVENOUS | Status: AC
  Administered 2015-07-16: 1000 mL via INTRAVENOUS

## 2015-07-16 NOTE — ED Provider Notes (Signed)
[facility] Emergency Department Provider Note   ____________________________________________  Time seen:  I have reviewed the triage vital signs and the triage nursing note.  HISTORY  Chief Complaint Abdominal Pain   Historian Patient and spouse  HPI Alexander [name] is a 59 y.o. male with a history of mantle cell lymphoma who is here for nausea, vomiting, and abdominal pain. He states he has had episodes like this in the past, and this feels similar. He states often he will receive fluids, Zofran, and morphine by IV and is able to go home. He has recently had admission to the hospital this month for similar symptoms. No fever. He has had some diarrhea but nonbloody. His vomitus has been nonbloody and nonbilious. Abdominal pain is moderate and mostly epigastric in location. He has tried Zofran ODT and suppository at home without relief.   Past Medical History  Diagnosis Date  . COPD (chronic obstructive pulmonary disease) ([place])   . Hypertension   . Migraine   . Asthma   . BPH (benign prostatic hyperplasia)   . Deviated nasal septum   . DVT (deep venous thrombosis) ([place])   . Mantle cell lymphoma [facility])     Patient Active Problem List   Diagnosis Date Noted  . Diarrhea 05/18/2015  . DEVIATED SEPTUM 05/18/2007  . ALLERGIC RHINITIS 05/18/2007  . ASTHMA 05/18/2007    Past Surgical History  Procedure Laterality Date  . Throat surgery    . Barryx N/A   . Esophagogastroduodenoscopy (egd) with propofol N/A 02/23/2015    Procedure: ESOPHAGOGASTRODUODENOSCOPY (EGD) WITH PROPOFOL;  Surgeon: Hulen Luster, MD;  Location[facility] ENDOSCOPY;  Service: Gastroenterology;  Laterality: N/A;  . Esophagogastroduodenoscopy (egd) with propofol N/A 05/04/2015    Procedure: ESOPHAGOGASTRODUODENOSCOPY (EGD) WITH PROPOFOL;  Surgeon: Hulen Luster, MD;  Location: [facility] ENDOSCOPY;  Service: Gastroenterology;  Laterality: N/A;  . Tonsillectomy      Current Outpatient Rx  Name  Route   Sig  Dispense  Refill  . budesonide-formoterol (SYMBICORT) 80-4.5 MCG/ACT inhaler   Inhalation   Inhale 2 puffs into the lungs 2 (two) times daily as needed (for shortness of breath).          . dicyclomine (BENTYL) 20 MG tablet   Oral   Take 20 mg by mouth every 6 (six) hours as needed for spasms.          Marland Kitchen docusate sodium (COLACE) 100 MG capsule   Oral   Take 200 mg by mouth 2 (two) times daily as needed for mild constipation.         . fentaNYL (DURAGESIC - DOSED MCG/HR) 50 MCG/HR   Transdermal   Place 50 mcg onto the skin every 3 (three) days.         . fluticasone (FLONASE) 50 MCG/ACT nasal spray   Each Nare   Place 2 sprays into both nostrils daily as needed for rhinitis.          Marland Kitchen loperamide (IMODIUM) 2 MG capsule   Oral   Take 2 mg by mouth as needed for diarrhea or loose stools.         Marland Kitchen OLANZapine (ZYPREXA) 5 MG tablet   Oral   Take 2.5 mg by mouth every 6 (six) hours as needed (for GI upset).          Marland Kitchen omeprazole (PRILOSEC) 20 MG capsule   Oral   Take 20 mg by mouth 2 (two) times daily.          Marland Kitchen  ondansetron (ZOFRAN ODT) 4 MG disintegrating tablet   Oral   Take 1 tablet (4 mg total) by mouth every 8 (eight) hours as needed for nausea or vomiting.   20 tablet   0   . ondansetron (ZOFRAN) 8 MG tablet   Oral   Take 8 mg by mouth every 6 (six) hours as needed for nausea or vomiting.         Marland Kitchen oxyCODONE (OXY IR/ROXICODONE) 5 MG immediate release tablet   Oral   Take 2 tablets (10 mg total) by mouth every 4 (four) hours as needed for severe pain.   30 tablet   0   . oxyCODONE 10 MG TABS   Oral   Take 1 tablet (10 mg total) by mouth every 4 (four) hours as needed for severe pain.   15 tablet   0   . promethazine (PHENERGAN) 25 MG suppository   Rectal   Place 1 suppository (25 mg total) rectally every 6 (six) hours as needed for nausea or vomiting.   12 suppository   0   . Rivaroxaban (XARELTO) 15 MG TABS tablet   Oral   Take 15  mg by mouth 2 (two) times daily.         . rivaroxaban (XARELTO) 20 MG TABS tablet   Oral   Take 1 tablet (20 mg total) by mouth daily with supper.   30 tablet   0   . senna (SENOKOT) 8.6 MG TABS tablet   Oral   Take 2 tablets by mouth 2 (two) times daily as needed for mild constipation.         . sucralfate (CARAFATE) 1 G tablet   Oral   Take 1 g by mouth 3 (three) times daily as needed (for GI upset).          . SUMAtriptan (IMITREX) 50 MG tablet   Oral   Take 50 mg by mouth every 2 (two) hours as needed for migraine.          . traZODone (DESYREL) 50 MG tablet   Oral   Take 50 mg by mouth at bedtime.         . urea (CARMOL) 20 % cream   Topical   Apply 1 application topically as needed (for dry skin on feet).            Allergies Ativan and Bee venom  Family History  Problem Relation Age of Onset  . CAD    . Diabetes      Social History Social History  Substance Use Topics  . Smoking status: Former Research scientist (life sciences)  . Smokeless tobacco: None  . Alcohol Use: No    Review of Systems  Constitutional: Negative for fever. Eyes: Negative for visual changes. ENT: Negative for sore throat. Cardiovascular: Negative for chest pain. Respiratory: Negative for shortness of breath. Gastrointestinal: Positive as per history of present illness Genitourinary: Negative for dysuria. Musculoskeletal: Negative for back pain. Skin: Negative for rash. Neurological: Negative for headache. 10 point Review of Systems otherwise negative ____________________________________________   PHYSICAL EXAM:  VITAL SIGNS: ED Triage Vitals  Enc Vitals Group     BP 07/16/15 1624 124/75 mmHg     Pulse Rate 07/16/15 1624 68     Resp 07/16/15 1624 20     Temp 07/16/15 1624 99.2 F (37.3 C)     Temp Source 07/16/15 1624 Oral     SpO2 07/16/15 1624 98 %     Weight 07/16/15 1624  180 lb (81.647 kg)     Height 07/16/15 1624 5\' 10"  (1.778 m)     Head Cir --      Peak Flow --       Pain Score 07/16/15 1625 9     Pain Loc --      Pain Edu? --      Excl. in [place]? --      Constitutional: Alert and oriented. Well appearing intermittent active emesis.. Eyes: Conjunctivae are normal. PERRL. Normal extraocular movements. ENT   Head: Normocephalic and atraumatic.   Nose: No congestion/rhinnorhea.   Mouth/Throat: Mucous membranes are moist.   Neck: No stridor. Cardiovascular/Chest: Normal rate, regular rhythm.  No murmurs, rubs, or gallops. Respiratory: Normal respiratory effort without tachypnea nor retractions. Breath sounds are clear and equal bilaterally. No wheezes/rales/rhonchi. Gastrointestinal: Soft. No distention, no guarding, no rebound. Moderate tenderness in the epigastrium and periumbilical area.  Genitourinary/rectal:Deferred Musculoskeletal: Nontender with normal range of motion in all extremities. No joint effusions.  No lower extremity tenderness.  No edema. Neurologic:  Normal speech and language. No gross or focal neurologic deficits are appreciated. Skin:  Skin is warm, dry and intact. No rash noted. Psychiatric: Mood and affect are normal. Speech and behavior are normal. Patient exhibits appropriate insight and judgment.  ____________________________________________   EKG I, Lisa Roca, MD, the attending physician have personally viewed and interpreted all ECGs.  51 bpm. Sinus bradycardia. Narrow QRS. Normal axis. Nonspecific ST and T-wave ____________________________________________  LABS (pertinent positives/negatives)  Urinalysis trace ketones otherwise negative for signs of infection White blood cell count 4.6, hemoglobin 13.3 and platelet count 220 Comprehensive metabolic panel significant for potassium 3.2 Lipase 14  ____________________________________________  RADIOLOGY All Xrays were viewed by me. Imaging interpreted by Radiologist.  CT abdomen and pelvis with contrast:  Pending __________________________________________  PROCEDURES  Procedure(s) performed: None  Critical Care performed: None  ____________________________________________   ED COURSE / ASSESSMENT AND PLAN  CONSULTATIONS: Hospitalist for admission  Pertinent labs & imaging results that were available during my care of the patient were reviewed by me and considered in my medical decision making (see chart for details).  Patient states that he often has episodes of nausea or vomiting and abdominal pain, and was hoping that he could just receive IV fluids and symptomatic medications and gain relief.  His laboratory evaluation is significant for a normal white blood cell count, however ketones in his urine and mildly low potassium consistent with his history of vomiting.  Patient still fairly symptomatic after receiving a second dose of IV morphine and second dose of IV Zofran. Patient is receiving his third dose of these medications. I discussed with the patient admitting to the hospital for intractable pain and vomiting. I am going to obtain a CT of his abdomen to ensure no intra-abdominal source such as obstruction.  CT scan abdomen is pending. Dr. Joni Fears to follow up result and admit to appropriate service.  Patient / Family / Caregiver informed of clinical course, medical decision-making process, and agree with plan.   ___________________________________________   FINAL CLINICAL IMPRESSION(S) / ED DIAGNOSES   Final diagnoses:  Abdominal pain, epigastric  Intractable vomiting with nausea, vomiting of unspecified type       Lisa Roca, MD 07/16/15 2149

## 2015-07-16 NOTE — ED Notes (Signed)
Nausea, vomiting and abdominal pain x 3 days

## 2015-07-17 ENCOUNTER — Encounter: Payer: Self-pay | Admitting: Internal Medicine

## 2015-07-17 DIAGNOSIS — G43909 Migraine, unspecified, not intractable, without status migrainosus: Secondary | ICD-10-CM | POA: Diagnosis present

## 2015-07-17 DIAGNOSIS — R1013 Epigastric pain: Secondary | ICD-10-CM | POA: Diagnosis not present

## 2015-07-17 DIAGNOSIS — R109 Unspecified abdominal pain: Secondary | ICD-10-CM | POA: Diagnosis present

## 2015-07-17 DIAGNOSIS — R112 Nausea with vomiting, unspecified: Secondary | ICD-10-CM | POA: Diagnosis present

## 2015-07-17 DIAGNOSIS — Z79899 Other long term (current) drug therapy: Secondary | ICD-10-CM | POA: Diagnosis not present

## 2015-07-17 DIAGNOSIS — Z9221 Personal history of antineoplastic chemotherapy: Secondary | ICD-10-CM | POA: Diagnosis not present

## 2015-07-17 DIAGNOSIS — Z8572 Personal history of non-Hodgkin lymphomas: Secondary | ICD-10-CM | POA: Diagnosis not present

## 2015-07-17 DIAGNOSIS — J440 Chronic obstructive pulmonary disease with acute lower respiratory infection: Secondary | ICD-10-CM | POA: Diagnosis present

## 2015-07-17 DIAGNOSIS — N400 Enlarged prostate without lower urinary tract symptoms: Secondary | ICD-10-CM | POA: Diagnosis present

## 2015-07-17 DIAGNOSIS — Z803 Family history of malignant neoplasm of breast: Secondary | ICD-10-CM | POA: Diagnosis not present

## 2015-07-17 DIAGNOSIS — Z833 Family history of diabetes mellitus: Secondary | ICD-10-CM | POA: Diagnosis not present

## 2015-07-17 DIAGNOSIS — E440 Moderate protein-calorie malnutrition: Secondary | ICD-10-CM | POA: Diagnosis present

## 2015-07-17 DIAGNOSIS — Z86718 Personal history of other venous thrombosis and embolism: Secondary | ICD-10-CM | POA: Diagnosis not present

## 2015-07-17 DIAGNOSIS — J45909 Unspecified asthma, uncomplicated: Secondary | ICD-10-CM | POA: Diagnosis present

## 2015-07-17 DIAGNOSIS — A084 Viral intestinal infection, unspecified: Secondary | ICD-10-CM | POA: Diagnosis present

## 2015-07-17 DIAGNOSIS — Z87891 Personal history of nicotine dependence: Secondary | ICD-10-CM | POA: Diagnosis not present

## 2015-07-17 DIAGNOSIS — Z6826 Body mass index (BMI) 26.0-26.9, adult: Secondary | ICD-10-CM | POA: Diagnosis not present

## 2015-07-17 DIAGNOSIS — Z7951 Long term (current) use of inhaled steroids: Secondary | ICD-10-CM | POA: Diagnosis not present

## 2015-07-17 DIAGNOSIS — Z9889 Other specified postprocedural states: Secondary | ICD-10-CM | POA: Diagnosis not present

## 2015-07-17 DIAGNOSIS — Z8249 Family history of ischemic heart disease and other diseases of the circulatory system: Secondary | ICD-10-CM | POA: Diagnosis not present

## 2015-07-17 DIAGNOSIS — I10 Essential (primary) hypertension: Secondary | ICD-10-CM | POA: Diagnosis present

## 2015-07-17 DIAGNOSIS — Z7901 Long term (current) use of anticoagulants: Secondary | ICD-10-CM | POA: Diagnosis not present

## 2015-07-17 DIAGNOSIS — Z888 Allergy status to other drugs, medicaments and biological substances status: Secondary | ICD-10-CM | POA: Diagnosis not present

## 2015-07-17 LAB — BASIC METABOLIC PANEL
Anion gap: 6 (ref 5–15)
BUN: 7 mg/dL (ref 6–20)
CALCIUM: 8.1 mg/dL — AB (ref 8.9–10.3)
CO2: 32 mmol/L (ref 22–32)
CREATININE: 0.62 mg/dL (ref 0.61–1.24)
Chloride: 102 mmol/L (ref 101–111)
GFR calc Af Amer: >60 mL/min (ref >60)
GLUCOSE: 123 mg/dL — AB (ref 65–99)
POTASSIUM: 2.9 mmol/L — AB (ref 3.5–5.1)
SODIUM: 140 mmol/L (ref 135–145)

## 2015-07-17 LAB — CBC
HEMATOCRIT: 34 % — AB (ref 40.0–52.0)
Hemoglobin: 11.6 g/dL — ABNORMAL LOW (ref 13.0–18.0)
MCH: 29.8 pg (ref 26.0–34.0)
MCHC: 34.1 g/dL (ref 32.0–36.0)
MCV: 87.4 fL (ref 80.0–100.0)
PLATELETS: 181 10*3/uL (ref 150–440)
RBC: 3.89 MIL/uL — ABNORMAL LOW (ref 4.40–5.90)
RDW: 12.9 % (ref 11.5–14.5)
WBC: 3.9 10*3/uL (ref 3.8–10.6)

## 2015-07-17 LAB — MAGNESIUM: Magnesium: 1.9 mg/dL (ref 1.7–2.4)

## 2015-07-17 MED ORDER — POTASSIUM CHLORIDE CRYS ER 20 MEQ PO TBCR
40.0000 meq | EXTENDED_RELEASE_TABLET | Freq: Two times a day (BID) | ORAL | Status: DC
  Administered 2015-07-17 – 2015-07-19 (×5): 40 meq via ORAL
  Filled 2015-07-17 (×5): qty 2

## 2015-07-17 MED ORDER — RIVAROXABAN 20 MG PO TABS
20.0000 mg | ORAL_TABLET | Freq: Every day | ORAL | Status: DC
  Administered 2015-07-17 – 2015-07-18 (×2): 20 mg via ORAL
  Filled 2015-07-17 (×2): qty 1

## 2015-07-17 MED ORDER — MORPHINE SULFATE (PF) 2 MG/ML IV SOLN
2.0000 mg | INTRAVENOUS | Status: DC | PRN
  Administered 2015-07-17 – 2015-07-19 (×12): 2 mg via INTRAVENOUS
  Filled 2015-07-17 (×12): qty 1

## 2015-07-17 MED ORDER — POTASSIUM CHLORIDE 10 MEQ/100ML IV SOLN
10.0000 meq | INTRAVENOUS | Status: AC
  Administered 2015-07-17 (×2): 10 meq via INTRAVENOUS
  Filled 2015-07-17 (×2): qty 100

## 2015-07-17 MED ORDER — METOPROLOL SUCCINATE ER 50 MG PO TB24: 50.0000 mg | ORAL_TABLET | Freq: Every day | ORAL | Status: DC

## 2015-07-17 MED ORDER — PROMETHAZINE HCL 25 MG/ML IJ SOLN
12.5000 mg | Freq: Four times a day (QID) | INTRAMUSCULAR | Status: DC | PRN
  Administered 2015-07-17 – 2015-07-18 (×6): 12.5 mg via INTRAVENOUS
  Filled 2015-07-17 (×6): qty 1

## 2015-07-17 MED ORDER — FLUTICASONE PROPIONATE 50 MCG/ACT NA SUSP
2.0000 | Freq: Every day | NASAL | Status: DC | PRN
  Filled 2015-07-17: qty 16

## 2015-07-17 MED ORDER — HYDROMORPHONE HCL 1 MG/ML IJ SOLN
1.0000 mg | Freq: Once | INTRAMUSCULAR | Status: AC
  Administered 2015-07-17: 18:00:00 1 mg via INTRAVENOUS
  Filled 2015-07-17: qty 1

## 2015-07-17 MED ORDER — KCL IN DEXTROSE-NACL 20-5-0.9 MEQ/L-%-% IV SOLN
INTRAVENOUS | Status: DC
  Administered 2015-07-17 – 2015-07-19 (×7): via INTRAVENOUS
  Filled 2015-07-17 (×12): qty 1000

## 2015-07-17 MED ORDER — ONDANSETRON HCL 4 MG/2ML IJ SOLN
4.0000 mg | Freq: Four times a day (QID) | INTRAMUSCULAR | Status: DC | PRN
  Administered 2015-07-17 – 2015-07-19 (×6): 4 mg via INTRAVENOUS
  Filled 2015-07-17 (×6): qty 2

## 2015-07-17 MED ORDER — BOOST / RESOURCE BREEZE PO LIQD
1.0000 | Freq: Three times a day (TID) | ORAL | Status: DC
  Administered 2015-07-17 – 2015-07-19 (×3): 1 via ORAL

## 2015-07-17 MED ORDER — ONDANSETRON HCL 4 MG PO TABS: 4.0000 mg | ORAL_TABLET | Freq: Four times a day (QID) | ORAL | Status: DC | PRN

## 2015-07-17 NOTE — Progress Notes (Signed)
[place] [facility] [place] NAME: Alexander [name]    MR#:  [id number]  DATE OF BIRTH:  07/14/1956  SUBJECTIVE:  CHIEF COMPLAINT:   Chief Complaint  Patient presents with  . Abdominal Pain    Tolerated clear liquids.  REVIEW OF SYSTEMS:  CONSTITUTIONAL: No fever, fatigue or weakness.  EYES: No blurred or double vision.  EARS, NOSE, AND THROAT: No tinnitus or ear pain.  RESPIRATORY: No cough, shortness of breath, wheezing or hemoptysis.  CARDIOVASCULAR: No chest pain, orthopnea, edema.  GASTROINTESTINAL: No nausea, vomiting, diarrhea , positive for abdominal pain.  GENITOURINARY: No dysuria, hematuria.  ENDOCRINE: No polyuria, nocturia,  HEMATOLOGY: No anemia, easy bruising or bleeding SKIN: No rash or lesion. MUSCULOSKELETAL: No joint pain or arthritis.   NEUROLOGIC: No tingling, numbness, weakness.  PSYCHIATRY: No anxiety or depression.   ROS  DRUG ALLERGIES:   Allergies  Allergen Reactions  . Ativan [Lorazepam] Other (See Comments)    Reaction:  Dizziness   . Bee Venom     VITALS:  Blood pressure 109/53, pulse 56, temperature 99 F (37.2 C), temperature source Oral, resp. rate 18, height 5\' 10"  (1.778 m), weight 83.008 kg (183 lb), SpO2 98 %.  PHYSICAL EXAMINATION:  GENERAL:  59 y.o.-year-old patient lying in the bed with no acute distress.  EYES: Pupils equal, round, reactive to light and accommodation. No scleral icterus. Extraocular muscles intact.  HEENT: Head atraumatic, normocephalic. Oropharynx and nasopharynx clear.  NECK:  Supple, no jugular venous distention. No thyroid enlargement, no tenderness.  LUNGS: Normal breath sounds bilaterally, no wheezing, rales,rhonchi or crepitation. No use of accessory muscles of respiration.  CARDIOVASCULAR: S1, S2 normal. No murmurs, rubs, or gallops.  ABDOMEN: Soft, nontender, nondistended. Bowel sounds present. No organomegaly or mass.  EXTREMITIES: No pedal edema, cyanosis, or  clubbing.  NEUROLOGIC: Cranial nerves II through XII are intact. Muscle strength 5/5 in all extremities. Sensation intact. Gait not checked.  PSYCHIATRIC: The patient is alert and oriented x 3.  SKIN: No obvious rash, lesion, or ulcer.   Physical Exam LABORATORY PANEL:   CBC  Recent Labs Lab 07/17/15 0428  WBC 3.9  HGB 11.6*  HCT 34.0*  PLT 181   ------------------------------------------------------------------------------------------------------------------  Chemistries   Recent Labs Lab 07/16/15 1627 07/17/15 0428  NA 139 140  K 3.2* 2.9*  CL 100* 102  CO2 31 32  GLUCOSE 108* 123*  BUN 9 7  CREATININE 0.69 0.62  CALCIUM 8.9 8.1*  MG  --  1.9  AST 17  --   ALT 12*  --   ALKPHOS 81  --   BILITOT 0.7  --    ------------------------------------------------------------------------------------------------------------------  Cardiac Enzymes No results for input(s): TROPONINI in the last 168 hours. ------------------------------------------------------------------------------------------------------------------  RADIOLOGY:  Ct Abdomen Pelvis W Contrast  07/16/2015  CLINICAL DATA:  Nausea and vomiting for 4 days. History of mantle cell lymphoma. EXAM: CT ABDOMEN AND PELVIS WITH CONTRAST TECHNIQUE: Multidetector CT imaging of the abdomen and pelvis was performed using the standard protocol following bolus administration of intravenous contrast. CONTRAST:  113mL OMNIPAQUE IOHEXOL 300 MG/ML  SOLN COMPARISON:  07/04/2015 FINDINGS: Lower chest: New patchy ground-glass opacities in the right lung base, possibly infectious. Hepatobiliary: There are normal appearances of the liver, gallbladder and bile ducts. Pancreas: Normal Spleen: Normal Adrenals/Urinary Tract: Unchanged low-attenuation 13 mm left adrenal nodule. Normal right adrenal. Unchanged 11 mm lower pole right renal cyst. Normal left kidney. Collecting systems, ureters and urinary bladder appear  unremarkable.  Stomach/Bowel: There are normal appearances of the stomach and small bowel except for a small hiatal hernia. The colon is remarkable only for mild uncomplicated diverticulosis. The appendix is normal. Vascular/Lymphatic: The abdominal aorta is normal in caliber. There is mild atherosclerotic calcification. There is no adenopathy in the abdomen or pelvis. Other: No acute inflammatory changes are evident in the abdomen or pelvis. No ascites. Musculoskeletal: No significant skeletal lesion. There is a small fat containing umbilical hernia. IMPRESSION: 1. New patchy ground-glass opacities in the right lung base, possibly infectious. 2. No acute findings are evident in the abdomen or pelvis. 3. Diverticulosis. 4. Small fat containing umbilical hernia. 5. Incidental findings include an unchanged benign appearing left adrenal nodule and a small right lower pole renal cyst. Electronically Signed   By: Andreas Newport M.D.   On: 07/16/2015 22:18    ASSESSMENT AND PLAN:   Principal Problem:   Abdominal pain Active Problems:   Nausea & vomiting   Malnutrition of moderate degree  * Viral gastritis    No more vomiting, mild nausea.    No diarrhea or fever.    Nausea meds.  * Hx of DVT   Xarelto.  * Hx of cancer   Finished chemo.    Upgrade diet- may d/c tomorrow.  All the records are reviewed and case discussed with Care Management/Social Workerr. Management plans discussed with the patient, family and they are in agreement.  CODE STATUS: full.  TOTAL TIME TAKING CARE OF THIS PATIENT: 40 minutes.     POSSIBLE D/C IN 1-2 DAYS, DEPENDING ON CLINICAL CONDITION.   Vaughan Basta M.D on 07/17/2015   Between 7am to 6pm - Pager - [phone]  After 6pm go to [email or web address] - password [email or web address] [facility]  Office  [phone]  CC: Primary care physician; Perrin Maltese, MD  Note: This dictation was prepared with Dragon dictation along with smaller phrase  technology. Any transcriptional errors that result from this process are unintentional.

## 2015-07-17 NOTE — Plan of Care (Signed)
Problem: Education: Goal: Knowledge of [place] [facility] Education information/materials will improve Outcome: Progressing REceived pt from ED.  Oriented pt to room, went over Welcome packet.    Problem: Safety: Goal: Ability to remain free from injury will improve Outcome: Progressing Pt understands to call with needs.  Bed alarm on, call bell and phone within reach.     Problem: Pain Managment: Goal: General experience of comfort will improve Outcome: Progressing Pt receives morphine for pain.

## 2015-07-17 NOTE — Progress Notes (Signed)
Spoke with Dr. Anselm Jungling because he has not been in to see pt and pt asking when MD will be in to see him.  Dr. Anselm Jungling states he will be in to see pt in an hour or two.  Pt made aware.  Clarise Cruz, RN

## 2015-07-17 NOTE — Plan of Care (Addendum)
IVF infusing, prn nausea medications given with improvement, pt had good urine output, diet upgraded to full liquid  Problem: Education: Goal: Knowledge of [place] Education information/materials will improve Outcome: Progressing Pt education of new medication phenergan, pt verbalized understanding.  Problem: Safety: Goal: Ability to remain free from injury will improve Outcome: Progressing Pt is high falls, bed alarm in use, call bell within reach, hourly safety rounds completed  Problem: Pain Managment: Goal: General experience of comfort will improve Outcome: Progressing Pt c/o abdominal pain, prn meds given with improvement, pt resting comfortably in bed

## 2015-07-17 NOTE — Progress Notes (Signed)
Initial Nutrition Assessment  DOCUMENTATION CODES:   Non-severe (moderate) malnutrition in context of chronic illness  INTERVENTION:  Meals and snacks: Cater to pt preferences, await diet progression Medical Nutrition Supplement Therapy: Recommend adding boost breeze TID   NUTRITION DIAGNOSIS:   Inadequate oral intake related to altered GI function as evidenced by meal completion < 25%.    GOAL:   Patient will meet greater than or equal to 90% of their needs    MONITOR:    (Energy intake, Digestive system)  REASON FOR ASSESSMENT:   Malnutrition Screening Tool    ASSESSMENT:      Pt admitted with abdominal pain, viral gastrenteritis  Past Medical History  Diagnosis Date  . COPD (chronic obstructive pulmonary disease) ([place])   . Hypertension   . Migraine   . Asthma   . BPH (benign prostatic hyperplasia)   . Deviated nasal septum   . DVT (deep venous thrombosis) ([place])   . Mantle cell lymphoma ([place])     Current Nutrition: took few sips of liquids this am but feeling nauseated this am  Food/Nutrition-Related History: Pt reports intake has been poor for the past 5 days prior to admission   Scheduled Medications:  . metoprolol succinate  50 mg Oral Daily  . rivaroxaban  20 mg Oral Q supper    Continuous Medications:  . dextrose 5 % and 0.9 % NaCl with KCl 20 mEq/L 125 mL/hr at 07/17/15 0935     Electrolyte/Renal Profile and Glucose Profile:   Recent Labs Lab 07/16/15 1627 07/17/15 0428  NA 139 140  K 3.2* 2.9*  CL 100* 102  CO2 31 32  BUN 9 7  CREATININE 0.69 0.62  CALCIUM 8.9 8.1*  GLUCOSE 108* 123*   Protein Profile:  Recent Labs Lab 07/16/15 1627  ALBUMIN 3.8    Gastrointestinal Profile: Last BM: 11/27   Nutrition-Focused Physical Exam Findings: Nutrition-Focused physical exam completed. Findings are mild/moderate fat depletion, normal to mild/moderate muscle depletion, and no edema.      Weight Change: 8% weight loss in the  last 5 months per wt encounters    Diet Order:  Diet clear liquid Room service appropriate?: Yes; Fluid consistency:: Thin  Skin:   reviewed   Height:   Ht Readings from Last 1 Encounters:  07/16/15 5\' 10"  (1.778 m)    Weight:   Wt Readings from Last 1 Encounters:  07/17/15 183 lb (83.008 kg)    Ideal Body Weight:     BMI:  Body mass index is 26.26 kg/(m^2).  Estimated Nutritional Needs:   Kcal:  BEE 1641 kcals (IF 1.1-1.3, AF 1.3) TO:[id number] kcals/d.   Protein:  (1.1-1.3 g/kg) 91-108 g/d  Fluid:  (25-53ml/kg) 2075-2457ml/d  EDUCATION NEEDS:   No education needs identified at this time  [place]. Zenia Resides, [place], [place] (pager)

## 2015-07-17 NOTE — H&P (Signed)
[facility] [place] NAME: Alexander [name]    MR#:  [id number]  DATE OF BIRTH:  05-03-1956  DATE OF ADMISSION:  07/16/2015  PRIMARY CARE PHYSICIAN: Perrin Maltese, MD   REQUESTING/REFERRING PHYSICIAN:   CHIEF COMPLAINT:   Chief Complaint  Patient presents with  . Abdominal Pain    HISTORY OF PRESENT ILLNESS: Alexander [name]  is a 59 y.o. male with a known history of mantle cell lymphoma, deep venous thrombosis on anticoagulation with Xarelto, benign prostate hypertrophy, hypertension, COPD presented to the emergency room with abdominal pain. Abdominal pain started since the day of Thanksgiving. The abdominal pain is aching in nature and located in the epigastrium and around the umbilicus area. Pain is 7 out of 10 on a scale of 1-10. The abdominal pain is nonradiating. Patient also complains of nausea and vomiting. Vomitus contained food and water particles. No history of any hematemesis. An episode of diarrhea was also noted which was watery stool. No history of recent travel. No history of any sick contacts at home. Patient was worked up with a CT abdomen in the emergency room. Patient not able to eat food or drink any fluids secondary to abdominal pain and vomiting.  PAST MEDICAL HISTORY:   Past Medical History  Diagnosis Date  . COPD (chronic obstructive pulmonary disease) ([place])   . Hypertension   . Migraine   . Asthma   . BPH (benign prostatic hyperplasia)   . Deviated nasal septum   . DVT (deep venous thrombosis) ([place])   . Mantle cell lymphoma ([place])     PAST SURGICAL HISTORY:  Past Surgical History  Procedure Laterality Date  . Throat surgery    . Barryx N/A   . Esophagogastroduodenoscopy (egd) with propofol N/A 02/23/2015    Procedure: ESOPHAGOGASTRODUODENOSCOPY (EGD) WITH PROPOFOL;  Surgeon: Hulen Luster, MD;  Location: [facility] ENDOSCOPY;  Service: Gastroenterology;  Laterality: N/A;  . Esophagogastroduodenoscopy (egd) with propofol  N/A 05/04/2015    Procedure: ESOPHAGOGASTRODUODENOSCOPY (EGD) WITH PROPOFOL;  Surgeon: Hulen Luster, MD;  Location: [facility] ENDOSCOPY;  Service: Gastroenterology;  Laterality: N/A;  . Tonsillectomy      SOCIAL HISTORY:  Social History  Substance Use Topics  . Smoking status: Former Research scientist (life sciences)  . Smokeless tobacco: Not on file  . Alcohol Use: No    FAMILY HISTORY:  Family History  Problem Relation Age of Onset  . Mesothelioma Father   . Breast cancer Mother     DRUG ALLERGIES:  Allergies  Allergen Reactions  . Ativan [Lorazepam] Other (See Comments)    Reaction:  Dizziness   . Bee Venom     REVIEW OF SYSTEMS:   CONSTITUTIONAL: No fever, fatigue or weakness.  EYES: No blurred or double vision.  EARS, NOSE, AND THROAT: No tinnitus or ear pain.  RESPIRATORY: No cough, shortness of breath, wheezing or hemoptysis.  CARDIOVASCULAR: No chest pain, orthopnea, edema.  GASTROINTESTINAL: Has nausea and vomiting,  abdominal pain present.  GENITOURINARY: No dysuria, hematuria.  ENDOCRINE: No polyuria, nocturia,  HEMATOLOGY: No anemia, easy bruising or bleeding SKIN: No rash or lesion. MUSCULOSKELETAL: No joint pain or arthritis.   NEUROLOGIC: No tingling, numbness, weakness.  PSYCHIATRY: No anxiety or depression.   MEDICATIONS AT HOME:  Prior to Admission medications   Medication Sig Start Date End Date Taking? Authorizing Provider  dicyclomine (BENTYL) 20 MG tablet Take 20 mg by mouth every 6 (six) hours as needed for spasms.    Yes Historical Provider,  MD  docusate sodium (COLACE) 100 MG capsule Take 200 mg by mouth 2 (two) times daily as needed for mild constipation.   Yes Historical Provider, MD  fluticasone (FLONASE) 50 MCG/ACT nasal spray Place 2 sprays into both nostrils daily as needed for rhinitis.    Yes Historical Provider, MD  loperamide (IMODIUM) 2 MG capsule Take 2 mg by mouth as needed for diarrhea or loose stools.   Yes Historical Provider, MD  metoprolol succinate (TOPROL  XL) 50 MG 24 hr tablet Take 50 mg by mouth daily.   Yes Historical Provider, MD  morphine (MSIR) 30 MG tablet Take 30 mg by mouth 3 (three) times daily.   Yes Historical Provider, MD  OLANZapine (ZYPREXA) 5 MG tablet Take 2.5 mg by mouth every 6 (six) hours as needed (for GI upset).    Yes Historical Provider, MD  oxyCODONE (OXY IR/ROXICODONE) 5 MG immediate release tablet Take 2 tablets (10 mg total) by mouth every 4 (four) hours as needed for severe pain. 02/26/15  Yes Hulen Luster, MD  promethazine (PHENERGAN) 25 MG suppository Place 1 suppository (25 mg total) rectally every 6 (six) hours as needed for nausea or vomiting. 04/25/15 04/24/16 Yes Loney Hering, MD  Rivaroxaban (XARELTO) 15 MG TABS tablet Take 15 mg by mouth 2 (two) times daily.   Yes Historical Provider, MD  senna (SENOKOT) 8.6 MG TABS tablet Take 2 tablets by mouth 2 (two) times daily as needed for mild constipation.   Yes Historical Provider, MD  SUMAtriptan (IMITREX) 50 MG tablet Take 50 mg by mouth every 2 (two) hours as needed for migraine.    Yes Historical Provider, MD  traZODone (DESYREL) 50 MG tablet Take 50 mg by mouth at bedtime.   Yes Historical Provider, MD  urea (CARMOL) 20 % cream Apply 1 application topically as needed (for dry skin on feet).    Yes Historical Provider, MD  ondansetron (ZOFRAN ODT) 4 MG disintegrating tablet Take 1 tablet (4 mg total) by mouth every 8 (eight) hours as needed for nausea or vomiting. 07/04/15   Harvest Dark, MD  oxyCODONE 10 MG TABS Take 1 tablet (10 mg total) by mouth every 4 (four) hours as needed for severe pain. Patient not taking: Reported on 07/16/2015 05/19/15   Bettey Costa, MD  rivaroxaban (XARELTO) 20 MG TABS tablet Take 1 tablet (20 mg total) by mouth daily with supper. 05/23/15   Bettey Costa, MD      PHYSICAL EXAMINATION:   VITAL SIGNS: Blood pressure 136/72, pulse 50, temperature 99.2 F (37.3 C), temperature source Oral, resp. rate 20, height 5\' 10"  (1.778 m), weight  81.647 kg (180 lb), SpO2 96 %.  GENERAL:  59 y.o.-year-old patient lying in the bed with no acute distress.  EYES: Pupils equal, round, reactive to light and accommodation. No scleral icterus. Extraocular muscles intact.  HEENT: Head atraumatic, normocephalic. Oropharynx and nasopharynx clear.  NECK:  Supple, no jugular venous distention. No thyroid enlargement, no tenderness.  LUNGS: Normal breath sounds bilaterally, no wheezing, rales,rhonchi or crepitation. No use of accessory muscles of respiration.  CARDIOVASCULAR: S1, S2 normal. No murmurs, rubs, or gallops.  ABDOMEN: Soft, tenderness noted in the epigastrium and periumbilical area., nondistended. Bowel sounds present. No organomegaly or mass.  EXTREMITIES: No pedal edema, cyanosis, or clubbing.  NEUROLOGIC: Cranial nerves II through XII are intact. Muscle strength 5/5 in all extremities. Sensation intact. Gait not checked.  PSYCHIATRIC: The patient is alert and oriented x 3.  SKIN: No  obvious rash, lesion, or ulcer.   LABORATORY PANEL:   CBC  Recent Labs Lab 07/16/15 1627  WBC 4.6  HGB 13.3  HCT 38.2*  PLT 220  MCV 87.2  MCH 30.4  MCHC 34.9  RDW 13.2  LYMPHSABS 0.9*  MONOABS 0.3  EOSABS 0.1  BASOSABS 0.0   ------------------------------------------------------------------------------------------------------------------  Chemistries   Recent Labs Lab 07/16/15 1627  NA 139  K 3.2*  CL 100*  CO2 31  GLUCOSE 108*  BUN 9  CREATININE 0.69  CALCIUM 8.9  AST 17  ALT 12*  ALKPHOS 81  BILITOT 0.7   ------------------------------------------------------------------------------------------------------------------ estimated creatinine clearance is 100.1 mL/min (by C-G formula based on Cr of 0.69). ------------------------------------------------------------------------------------------------------------------ No results for input(s): TSH, T4TOTAL, T3FREE, THYROIDAB in the last 72 hours.  Invalid input(s):  FREET3   Coagulation profile No results for input(s): INR, PROTIME in the last 168 hours. ------------------------------------------------------------------------------------------------------------------- No results for input(s): DDIMER in the last 72 hours. -------------------------------------------------------------------------------------------------------------------  Cardiac Enzymes No results for input(s): CKMB, TROPONINI, MYOGLOBIN in the last 168 hours.  Invalid input(s): CK ------------------------------------------------------------------------------------------------------------------ Invalid input(s): POCBNP  ---------------------------------------------------------------------------------------------------------------  Urinalysis    Component Value Date/Time   COLORURINE AMBER* 07/16/2015 1759   COLORURINE Yellow 11/27/2014 1918   APPEARANCEUR CLEAR* 07/16/2015 1759   APPEARANCEUR Clear 11/27/2014 1918   LABSPEC 1.020 07/16/2015 1759   LABSPEC 1.017 11/27/2014 1918   PHURINE 8.0 07/16/2015 1759   PHURINE 7.0 11/27/2014 1918   GLUCOSEU NEGATIVE 07/16/2015 1759   GLUCOSEU Negative 11/27/2014 1918   HGBUR NEGATIVE 07/16/2015 1759   HGBUR Negative 11/27/2014 1918   BILIRUBINUR 1+* 07/16/2015 1759   BILIRUBINUR Negative 11/27/2014 1918   KETONESUR TRACE* 07/16/2015 1759   KETONESUR Negative 11/27/2014 1918   PROTEINUR 30* 07/16/2015 1759   PROTEINUR Negative 11/27/2014 1918   NITRITE NEGATIVE 07/16/2015 1759   NITRITE Negative 11/27/2014 1918   LEUKOCYTESUR NEGATIVE 07/16/2015 1759   LEUKOCYTESUR Negative 11/27/2014 1918     RADIOLOGY: Ct Abdomen Pelvis W Contrast  07/16/2015  CLINICAL DATA:  Nausea and vomiting for 4 days. History of mantle cell lymphoma. EXAM: CT ABDOMEN AND PELVIS WITH CONTRAST TECHNIQUE: Multidetector CT imaging of the abdomen and pelvis was performed using the standard protocol following bolus administration of intravenous contrast.  CONTRAST:  11mL OMNIPAQUE IOHEXOL 300 MG/ML  SOLN COMPARISON:  07/04/2015 FINDINGS: Lower chest: New patchy ground-glass opacities in the right lung base, possibly infectious. Hepatobiliary: There are normal appearances of the liver, gallbladder and bile ducts. Pancreas: Normal Spleen: Normal Adrenals/Urinary Tract: Unchanged low-attenuation 13 mm left adrenal nodule. Normal right adrenal. Unchanged 11 mm lower pole right renal cyst. Normal left kidney. Collecting systems, ureters and urinary bladder appear unremarkable. Stomach/Bowel: There are normal appearances of the stomach and small bowel except for a small hiatal hernia. The colon is remarkable only for mild uncomplicated diverticulosis. The appendix is normal. Vascular/Lymphatic: The abdominal aorta is normal in caliber. There is mild atherosclerotic calcification. There is no adenopathy in the abdomen or pelvis. Other: No acute inflammatory changes are evident in the abdomen or pelvis. No ascites. Musculoskeletal: No significant skeletal lesion. There is a small fat containing umbilical hernia. IMPRESSION: 1. New patchy ground-glass opacities in the right lung base, possibly infectious. 2. No acute findings are evident in the abdomen or pelvis. 3. Diverticulosis. 4. Small fat containing umbilical hernia. 5. Incidental findings include an unchanged benign appearing left adrenal nodule and a small right lower pole renal cyst. Electronically Signed   By: Valerie Roys.D.  On: 07/16/2015 22:18    EKG: Orders placed or performed during the hospital encounter of 07/16/15  . ED EKG  . ED EKG    IMPRESSION AND PLAN: 1. Abdominal pain 2. Probable viral gastroenteritis 3. Nausea and vomiting 4. Mantle cell lymphoma 5. Add another nodule 6.History of DVT Management plan IV hydration with D5 normal saline fluids Clear liquid diet Pain management with IV morphine as needed GI prophylaxis with Protonix Clear liquid diet and advance as  tolerated IV Zofran for nausea and vomiting Continue Xarelto for anticoagulation Monitor electrolytes  All the records are reviewed and case discussed with ED provider. Management plans discussed with the patient, family and they are in agreement.  CODE STATUS:Full    TOTAL TIME TAKING CARE OF THIS PATIENT: 46 minutes.    Saundra Shelling M.D on 07/17/2015 at 12:11 AM  Between 7am to 6pm - Pager - [phone]  After 6pm go to [email or web address] - password [email or web address] [place] [facility]  Office  [phone]  CC: Primary care physician; Perrin Maltese, MD

## 2015-07-18 ENCOUNTER — Encounter: Payer: Self-pay | Admitting: Radiology

## 2015-07-18 ENCOUNTER — Inpatient Hospital Stay: Payer: Medicaid Other

## 2015-07-18 LAB — C DIFFICILE QUICK SCREEN W PCR REFLEX
C DIFFICILE (CDIFF) INTERP: NEGATIVE
C DIFFICILE (CDIFF) TOXIN: NEGATIVE
C Diff antigen: NEGATIVE

## 2015-07-18 LAB — BASIC METABOLIC PANEL
Anion gap: 5 (ref 5–15)
BUN: <5 mg/dL — ABNORMAL LOW (ref 6–20)
CALCIUM: 8 mg/dL — AB (ref 8.9–10.3)
CO2: 26 mmol/L (ref 22–32)
CREATININE: 0.61 mg/dL (ref 0.61–1.24)
Chloride: 108 mmol/L (ref 101–111)
GFR calc non Af Amer: >60 mL/min (ref >60)
Glucose, Bld: 128 mg/dL — ABNORMAL HIGH (ref 65–99)
Potassium: 3.3 mmol/L — ABNORMAL LOW (ref 3.5–5.1)
SODIUM: 139 mmol/L (ref 135–145)

## 2015-07-18 MED ORDER — RIVAROXABAN 20 MG PO TABS: 20.0000 mg | ORAL_TABLET | Freq: Every day | ORAL | Status: DC

## 2015-07-18 MED ORDER — IOHEXOL 240 MG/ML SOLN
50.0000 mL | INTRAMUSCULAR | Status: AC
  Administered 2015-07-18 (×2): 50 mL via ORAL

## 2015-07-18 MED ORDER — LOPERAMIDE HCL 2 MG PO CAPS
2.0000 mg | ORAL_CAPSULE | ORAL | Status: DC | PRN
  Administered 2015-07-18 (×2): 2 mg via ORAL
  Filled 2015-07-18 (×2): qty 1

## 2015-07-18 MED ORDER — HYDROMORPHONE HCL 1 MG/ML IJ SOLN
2.0000 mg | Freq: Once | INTRAMUSCULAR | Status: AC
  Administered 2015-07-18: 05:00:00 2 mg via INTRAVENOUS
  Filled 2015-07-18: qty 2

## 2015-07-18 MED ORDER — BOOST / RESOURCE BREEZE PO LIQD: 1.0000 | Freq: Three times a day (TID) | ORAL | Status: DC

## 2015-07-18 MED ORDER — IOHEXOL 350 MG/ML SOLN
100.0000 mL | Freq: Once | INTRAVENOUS | Status: AC | PRN
  Administered 2015-07-18: 16:00:00 100 mL via INTRAVENOUS

## 2015-07-18 NOTE — Plan of Care (Signed)
Problem: Safety: Goal: Ability to remain free from injury will improve Outcome: Progressing Pt remains free from injury.  Bed alarm on, bed in lowest position, call bell and phone within reach. Pt calls with needs.     Problem: Pain Managment: Goal: General experience of comfort will improve Outcome: Progressing Pt continues to have pain and takes Morphine.  Nausea is relieved with phernergan, pt refuses zofran.

## 2015-07-18 NOTE — Progress Notes (Signed)
Nurse reports patient unable to tolerate soft diet due to nausea and abdominal pain. I will order CT scan of abdomen.

## 2015-07-18 NOTE — Progress Notes (Signed)
[place] [facility] [place] NAME: Alexander [name]    MR#:  [id number]  DATE OF BIRTH:  17-Nov-1955  SUBJECTIVE:  Patient complaining of some diarrhea without abdominal pain. He did tolerate his clear liquid diet.  REVIEW OF SYSTEMS:   Review of Systems  Constitutional: Negative for fever, chills and malaise/fatigue.  HENT: Negative for sore throat.   Eyes: Negative for blurred vision.  Respiratory: Negative for cough, hemoptysis, shortness of breath and wheezing.   Cardiovascular: Negative for chest pain, palpitations and leg swelling.  Gastrointestinal: Positive for diarrhea. Negative for nausea, vomiting, abdominal pain and blood in stool.  Genitourinary: Negative for dysuria.  Musculoskeletal: Negative for back pain.  Neurological: Positive for weakness. Negative for dizziness, tremors and headaches.  Endo/Heme/Allergies: Does not bruise/bleed easily.    DRUG ALLERGIES:   Allergies  Allergen Reactions  . Ativan [Lorazepam] Other (See Comments)    Reaction:  Dizziness   . Bee Venom     VITALS:  Blood pressure 122/59, pulse 51, temperature 98.2 F (36.8 C), temperature source Oral, resp. rate 18, height 5\' 10"  (1.778 m), weight 83.008 kg (183 lb), SpO2 97 %.  PHYSICAL EXAMINATION:   Physical Exam  Constitutional: He is oriented to person, place, and time and well-developed, well-nourished, and in no distress. No distress.  HENT:  Head: Normocephalic.  Eyes: No scleral icterus.  Neck: Normal range of motion. Neck supple. No JVD present. No tracheal deviation present.  Cardiovascular: Normal rate, regular rhythm and normal heart sounds.  Exam reveals no gallop and no friction rub.   No murmur heard. Pulmonary/Chest: Effort normal and breath sounds normal. No respiratory distress. He has no wheezes. He has no rales. He exhibits no tenderness.  Abdominal: Soft. Bowel sounds are normal. He exhibits no distension and no mass. There  is no tenderness. There is no rebound and no guarding.  Musculoskeletal: Normal range of motion. He exhibits no edema.  Neurological: He is alert and oriented to person, place, and time.  Skin: Skin is warm. No rash noted. No erythema.  Psychiatric: Affect and judgment normal.   LABORATORY PANEL:   CBC  Recent Labs Lab 07/17/15 0428  WBC 3.9  HGB 11.6*  HCT 34.0*  PLT 181   ------------------------------------------------------------------------------------------------------------------  Chemistries   Recent Labs Lab 07/16/15 1627 07/17/15 0428 07/18/15 0520  NA 139 140 139  K 3.2* 2.9* 3.3*  CL 100* 102 108  CO2 31 32 26  GLUCOSE 108* 123* 128*  BUN 9 7 <5*  CREATININE 0.69 0.62 0.61  CALCIUM 8.9 8.1* 8.0*  MG  --  1.9  --   AST 17  --   --   ALT 12*  --   --   ALKPHOS 81  --   --   BILITOT 0.7  --   --    ------------------------------------------------------------------------------------------------------------------  Cardiac Enzymes No results for input(s): TROPONINI in the last 168 hours. ------------------------------------------------------------------------------------------------------------------  RADIOLOGY:  Ct Abdomen Pelvis W Contrast  07/16/2015  CLINICAL DATA:  Nausea and vomiting for 4 days. History of mantle cell lymphoma. EXAM: CT ABDOMEN AND PELVIS WITH CONTRAST TECHNIQUE: Multidetector CT imaging of the abdomen and pelvis was performed using the standard protocol following bolus administration of intravenous contrast. CONTRAST:  147mL OMNIPAQUE IOHEXOL 300 MG/ML  SOLN COMPARISON:  07/04/2015 FINDINGS: Lower chest: New patchy ground-glass opacities in the right lung base, possibly infectious. Hepatobiliary: There are normal appearances of the liver, gallbladder and bile ducts. Pancreas:  Normal Spleen: Normal Adrenals/Urinary Tract: Unchanged low-attenuation 13 mm left adrenal nodule. Normal right adrenal. Unchanged 11 mm lower pole right renal  cyst. Normal left kidney. Collecting systems, ureters and urinary bladder appear unremarkable. Stomach/Bowel: There are normal appearances of the stomach and small bowel except for a small hiatal hernia. The colon is remarkable only for mild uncomplicated diverticulosis. The appendix is normal. Vascular/Lymphatic: The abdominal aorta is normal in caliber. There is mild atherosclerotic calcification. There is no adenopathy in the abdomen or pelvis. Other: No acute inflammatory changes are evident in the abdomen or pelvis. No ascites. Musculoskeletal: No significant skeletal lesion. There is a small fat containing umbilical hernia. IMPRESSION: 1. New patchy ground-glass opacities in the right lung base, possibly infectious. 2. No acute findings are evident in the abdomen or pelvis. 3. Diverticulosis. 4. Small fat containing umbilical hernia. 5. Incidental findings include an unchanged benign appearing left adrenal nodule and a small right lower pole renal cyst. Electronically Signed   By: Andreas Newport M.D.   On: 07/16/2015 22:18    ASSESSMENT AND PLAN:   59 year old male with mantle cell lymphoma and DVT who presented with abdominal pain and diarrhea.  * Viral gastritis: Patient is tolerating clear liquid diet but still has some diarrhea. He denies abdominal pain. His physical exam shows no tenderness on abdominal examination. Patient will try soft diet if he tolerates his he may possibly be able to be discharged later today.   * Hx of DVT   Xarelto.  * Hx of cancer   Finished chemo.      Management plans discussed with the patient who is in agreement CODE STATUS: full.  TOTAL TIME TAKING CARE OF THIS PATIENT: 29minutes.     POSSIBLE D/C IN 1-2 DAYS, DEPENDING ON CLINICAL CONDITION.   Deondra Labrador M.D on 07/18/2015   Between 7am to 6pm - Pager - [phone]  After 6pm go to [email or web address] - password [email or web address] [place] [facility]  Office  [phone]  CC: Primary  care physician; Perrin Maltese, MD  Note: This dictation was prepared with Dragon dictation along with smaller phrase technology. Any transcriptional errors that result from this process are unintentional.

## 2015-07-18 NOTE — Progress Notes (Signed)
Patient ambulated with cane around nurses station without complications. Madlyn Frankel, RN

## 2015-07-18 NOTE — Progress Notes (Signed)
Received MD order to cancel discharge due to patient complaining of increasing abdominal pain after lunch of soft diet. Patient may discharge home tomorrow per Dr. Benjie Karvonen. Madlyn Frankel, RN

## 2015-07-19 LAB — BASIC METABOLIC PANEL
Anion gap: 7 (ref 5–15)
CALCIUM: 8.4 mg/dL — AB (ref 8.9–10.3)
CO2: 25 mmol/L (ref 22–32)
CREATININE: 0.7 mg/dL (ref 0.61–1.24)
Chloride: 107 mmol/L (ref 101–111)
Glucose, Bld: 147 mg/dL — ABNORMAL HIGH (ref 65–99)
Potassium: 3.8 mmol/L (ref 3.5–5.1)
SODIUM: 139 mmol/L (ref 135–145)

## 2015-07-19 MED ORDER — CEFUROXIME AXETIL 250 MG PO TABS: 250.0000 mg | ORAL_TABLET | Freq: Two times a day (BID) | ORAL | Status: DC

## 2015-07-19 MED ORDER — DEXTROSE 5 % IV SOLN
500.0000 mg | INTRAVENOUS | Status: DC
  Filled 2015-07-19: qty 500

## 2015-07-19 MED ORDER — AZITHROMYCIN 250 MG PO TABS: 250.0000 mg | ORAL_TABLET | Freq: Every day | ORAL | Status: DC

## 2015-07-19 MED ORDER — DEXTROSE 5 % IV SOLN
1.0000 g | INTRAVENOUS | Status: DC
  Filled 2015-07-19: qty 10

## 2015-07-19 NOTE — Plan of Care (Signed)
Problem: Safety: Goal: Ability to remain free from injury will improve Outcome: Progressing Pt refuses bed alarm. Reinforced fall protocol with patient.   Problem: Pain Managment: Goal: General experience of comfort will improve Outcome: Progressing Pt still with complaints of abdominal, bilateral leg and arm pain. Morphine 2 mg given. Also patient complained of nausea, Phenergan 12.5 mg given once this shift. Pt verbalized that his nausea had improved.   Problem: Spiritual Needs Goal: Ability to function at adequate level Outcome: Progressing Pt up ad lib without difficulty.

## 2015-07-19 NOTE — Discharge Summary (Signed)
[place] [facility] [place] NAME: Alexander [name]    MR#:  [id number]  DATE OF BIRTH:  [birth year]  DATE OF ADMISSION:  07/16/2015 ADMITTING PHYSICIAN: Saundra Shelling, MD  DATE OF DISCHARGE: 07/19/2015  PRIMARY CARE PHYSICIAN: Perrin Maltese, MD    ADMISSION DIAGNOSIS:  Abdominal pain, epigastric [R10.13] Intractable vomiting with nausea, vomiting of unspecified type [R11.10]  DISCHARGE DIAGNOSIS:  Principal Problem:   Abdominal pain Active Problems:   Nausea & vomiting   Malnutrition of moderate degree   Possible viral gastritis.   Pneumonia.  SECONDARY DIAGNOSIS:   Past Medical History  Diagnosis Date  . COPD (chronic obstructive pulmonary disease) ([place])   . Hypertension   . Migraine   . Asthma   . BPH (benign prostatic hyperplasia)   . Deviated nasal septum   . DVT (deep venous thrombosis) ([place])   . Mantle cell lymphoma [facility])     HOSPITAL COURSE:   59 year old male with mantle cell lymphoma and DVT who presented with abdominal pain and diarrhea.  * Viral gastritis: Patient was tolerating liquid diet , upgraded to soft. He denies abdominal pain. His physical exam shows no tenderness on abdominal examination.  * Hx of DVT  Xarelto continued.  * Hx of cancer  Finished chemo.  * Pneumonia  as found on CT abdomen on admission and again on 07/18/15   Pt does not have much fever, cough- likely the abdominal symptoms are due to that.   Oral ceftin and azithromycin on d/c.     DISCHARGE CONDITIONS:   Stable.  CONSULTS OBTAINED:     DRUG ALLERGIES:   Allergies  Allergen Reactions  . Ativan [Lorazepam] Other (See Comments)    Reaction:  Dizziness   . Bee Venom     DISCHARGE MEDICATIONS:   Current Discharge Medication List    START taking these medications   Details  azithromycin (ZITHROMAX) 250 MG tablet Take 1 tablet (250 mg total) by mouth daily. Qty: 4 each, Refills: 0    cefUROXime (CEFTIN) 250  MG tablet Take 1 tablet (250 mg total) by mouth 2 (two) times daily with a meal. Qty: 8 tablet, Refills: 0    feeding supplement (BOOST / RESOURCE BREEZE) LIQD Take 1 Container by mouth 3 (three) times daily between meals. Qty: 1 Container, Refills: 0      CONTINUE these medications which have CHANGED   Details  rivaroxaban (XARELTO) 20 MG TABS tablet Take 1 tablet (20 mg total) by mouth daily with supper. Qty: 30 tablet, Refills: 0      CONTINUE these medications which have NOT CHANGED   Details  dicyclomine (BENTYL) 20 MG tablet Take 20 mg by mouth every 6 (six) hours as needed for spasms.     docusate sodium (COLACE) 100 MG capsule Take 200 mg by mouth 2 (two) times daily as needed for mild constipation.    fluticasone (FLONASE) 50 MCG/ACT nasal spray Place 2 sprays into both nostrils daily as needed for rhinitis.     loperamide (IMODIUM) 2 MG capsule Take 2 mg by mouth as needed for diarrhea or loose stools.    metoprolol succinate (TOPROL XL) 50 MG 24 hr tablet Take 50 mg by mouth daily.    morphine (MSIR) 30 MG tablet Take 30 mg by mouth 3 (three) times daily.    OLANZapine (ZYPREXA) 5 MG tablet Take 2.5 mg by mouth every 6 (six) hours as needed (for GI upset).     !!  oxyCODONE (OXY IR/ROXICODONE) 5 MG immediate release tablet Take 2 tablets (10 mg total) by mouth every 4 (four) hours as needed for severe pain. Qty: 30 tablet, Refills: 0    promethazine (PHENERGAN) 25 MG suppository Place 1 suppository (25 mg total) rectally every 6 (six) hours as needed for nausea or vomiting. Qty: 12 suppository, Refills: 0    senna (SENOKOT) 8.6 MG TABS tablet Take 2 tablets by mouth 2 (two) times daily as needed for mild constipation.    SUMAtriptan (IMITREX) 50 MG tablet Take 50 mg by mouth every 2 (two) hours as needed for migraine.     traZODone (DESYREL) 50 MG tablet Take 50 mg by mouth at bedtime.    urea (CARMOL) 20 % cream Apply 1 application topically as needed (for dry skin  on feet).     ondansetron (ZOFRAN ODT) 4 MG disintegrating tablet Take 1 tablet (4 mg total) by mouth every 8 (eight) hours as needed for nausea or vomiting. Qty: 20 tablet, Refills: 0    !! oxyCODONE 10 MG TABS Take 1 tablet (10 mg total) by mouth every 4 (four) hours as needed for severe pain. Qty: 15 tablet, Refills: 0     !! - Potential duplicate medications found. Please discuss with provider.       DISCHARGE INSTRUCTIONS:    Follow with PMD in 1-2 weeks.  If you experience worsening of your admission symptoms, develop shortness of breath, life threatening emergency, suicidal or homicidal thoughts you must seek medical attention immediately by calling 911 or calling your MD immediately  if symptoms less severe.  You Must read complete instructions/literature along with all the possible adverse reactions/side effects for all the Medicines you take and that have been prescribed to you. Take any new Medicines after you have completely understood and accept all the possible adverse reactions/side effects.   Please note  You were cared for by a hospitalist during your hospital stay. If you have any questions about your discharge medications or the care you received while you were in the hospital after you are discharged, you can call the unit and asked to speak with the hospitalist on call if the hospitalist that took care of you is not available. Once you are discharged, your primary care physician will handle any further medical issues. Please note that NO REFILLS for any discharge medications will be authorized once you are discharged, as it is imperative that you return to your primary care physician (or establish a relationship with a primary care physician if you do not have one) for your aftercare needs so that they can reassess your need for medications and monitor your lab values.    Today   CHIEF COMPLAINT:   Chief Complaint  Patient presents with  . Abdominal Pain     HISTORY OF PRESENT ILLNESS:  Alexander [name]  is a 59 y.o. male with a known history of mantle cell lymphoma, deep venous thrombosis on anticoagulation with Xarelto, benign prostate hypertrophy, hypertension, COPD presented to the emergency room with abdominal pain. Abdominal pain started since the day of Thanksgiving. The abdominal pain is aching in nature and located in the epigastrium and around the umbilicus area. Pain is 7 out of 10 on a scale of 1-10. The abdominal pain is nonradiating. Patient also complains of nausea and vomiting. Vomitus contained food and water particles. No history of any hematemesis. An episode of diarrhea was also noted which was watery stool. No history of recent travel. No history  of any sick contacts at home. Patient was worked up with a CT abdomen in the emergency room. Patient not able to eat food or drink any fluids secondary to abdominal pain and vomiting.   VITAL SIGNS:  Blood pressure 132/70, pulse 50, temperature 99.1 F (37.3 C), temperature source Oral, resp. rate 16, height 5\' 10"  (1.778 m), weight 83.008 kg (183 lb), SpO2 98 %.  I/O:   Intake/Output Summary (Last 24 hours) at 07/19/15 1015 Last data filed at 07/19/15 0930  Gross per 24 hour  Intake 2154.33 ml  Output   1100 ml  Net 1054.33 ml    PHYSICAL EXAMINATION:   Constitutional: He is oriented to person, place, and time and well-developed, well-nourished, and in no distress. No distress.  HENT:  Head: Normocephalic.  Eyes: No scleral icterus.  Neck: Normal range of motion. Neck supple. No JVD present. No tracheal deviation present.  Cardiovascular: Normal rate, regular rhythm and normal heart sounds. Exam reveals no gallop and no friction rub.  No murmur heard. Pulmonary/Chest: Effort normal and breath sounds normal. No respiratory distress. He has no wheezes. He has no rales. He exhibits no tenderness.  Abdominal: Soft. Bowel sounds are normal. He exhibits no distension and no  mass. There is no tenderness. There is no rebound and no guarding.  Musculoskeletal: Normal range of motion. He exhibits no edema.  Neurological: He is alert and oriented to person, place, and time.  Skin: Skin is warm. No rash noted. No erythema.  Psychiatric: Affect and judgment normal.   DATA REVIEW:   CBC  Recent Labs Lab 07/17/15 0428  WBC 3.9  HGB 11.6*  HCT 34.0*  PLT 181    Chemistries   Recent Labs Lab 07/16/15 1627 07/17/15 0428  07/19/15 0551  NA 139 140  < > 139  K 3.2* 2.9*  < > 3.8  CL 100* 102  < > 107  CO2 31 32  < > 25  GLUCOSE 108* 123*  < > 147*  BUN 9 7  < > <5*  CREATININE 0.69 0.62  < > 0.70  CALCIUM 8.9 8.1*  < > 8.4*  MG  --  1.9  --   --   AST 17  --   --   --   ALT 12*  --   --   --   ALKPHOS 81  --   --   --   BILITOT 0.7  --   --   --   < > = values in this interval not displayed.  Cardiac Enzymes No results for input(s): TROPONINI in the last 168 hours.  Microbiology Results  Results for orders placed or performed during the hospital encounter of 07/16/15  C difficile quick scan w PCR reflex     Status: None   Collection Time: 07/18/15  5:30 PM  Result Value Ref Range Status   C Diff antigen NEGATIVE NEGATIVE Final   C Diff toxin NEGATIVE NEGATIVE Final   C Diff interpretation Negative for C. difficile  Final    RADIOLOGY:  Ct Abdomen Pelvis W Contrast  07/18/2015  CLINICAL DATA:  59 year old male with history of mantle cell lymphoma treated with chemotherapy 1 year ago. Patient presenting with history of nausea, vomiting and diarrhea. EXAM: CT ABDOMEN AND PELVIS WITH CONTRAST TECHNIQUE: Multidetector CT imaging of the abdomen and pelvis was performed using the standard protocol following bolus administration of intravenous contrast. CONTRAST:  196mL OMNIPAQUE IOHEXOL 350 MG/ML SOLN COMPARISON:  CT  the abdomen and pelvis 07/16/2015. FINDINGS: Lower chest: Patchy areas of bronchial wall thickening with peribronchovascular  ground-glass attenuation and micronodularity noted throughout the right middle lobe and basal segments of the right lower lobe, similar to the recent prior examination from 2 days ago. With in addition, there is some prominent soft tissue in the right infrahilar region adjacent to right lower lobe anterobasal segment bronchi, presumably borderline enlarged reactive interlobar lymph nodes. Some very faint centrilobular ground-glass attenuation micro nodularity is also noted in the left lower lobe, likely reflective of some mild endobronchial spread of infection. Hepatobiliary: No cystic or solid hepatic lesions. No intra or extrahepatic biliary ductal dilatation. Gallbladder is normal in appearance. Pancreas: No pancreatic mass. No pancreatic ductal dilatation. No pancreatic or peripancreatic fluid or inflammatory changes. Spleen: Spleen is mildly enlarged measuring 15.2 x 4.5 x 13.1 cm (estimated splenic volume of 448 mL). Adrenals/Urinary Tract: 1.3 x 1.0 cm nodule in the medial limb of the left adrenal gland is unchanged over numerous prior examinations dating back to at least 02/08/2014, likely to represent a small lipid poor adenoma. Right adrenal gland is normal in appearance. Left kidney is normal in appearance. Sub cm low-attenuation lesion in the lower pole the right kidney is too small to definitively characterize, but is statistically likely a cyst. No hydroureteronephrosis. Urinary bladder is normal in appearance. Stomach/Bowel: The appearance of the stomach is normal. An no pathologic dilatation of small bowel or colon. Normal appendix. A few scattered colonic diverticulae are noted, most evident in the sigmoid colon, and appears to be chronic wall thickening in the sigmoid colon, without surrounding inflammation, presumably related to chronic diverticular disease. Vascular/Lymphatic: Atherosclerosis throughout the abdominal and pelvic vasculature, without evidence of aneurysm or dissection. No  lymphadenopathy noted in the abdomen or pelvis. Reproductive: Prostate gland and seminal vesicles are unremarkable in appearance. Other: No significant volume of ascites.  No pneumoperitoneum. Musculoskeletal: There are no aggressive appearing lytic or blastic lesions noted in the visualized portions of the skeleton. IMPRESSION: 1. No acute findings in the abdomen or pelvis to account for the patient's symptoms. 2. The appearance in the right lung base is again most compatible with a multilobar bronchopneumonia, as discussed above, without significant change compared to the CT examination from just 2 days ago. 3. Splenomegaly is unchanged. 4. Small left adrenal nodule is most compatible with a lipid poor adenoma. 5. Mild atherosclerosis. 6. Normal appendix. 7. Mild colonic diverticulosis with chronic thickening of the sigmoid colon, similar prior studies. No findings to suggest acute diverticulitis at this time. Electronically Signed   By: Vinnie Langton M.D.   On: 07/18/2015 16:26     Management plans discussed with the patient, family and they are in agreement.  CODE STATUS:     Code Status Orders        Start     Ordered   07/17/15 0108  Full code   Continuous     07/17/15 0108      TOTAL TIME TAKING CARE OF THIS PATIENT: 35 minutes.    Vaughan Basta M.D on 07/19/2015 at 10:15 AM  Between 7am to 6pm - Pager - [phone]  After 6pm go to [email or web address] - password [email or web address]  Office  [phone]  CC: Primary care physician; Perrin Maltese, MD   Note: This dictation was prepared with Dragon dictation along with smaller phrase technology. Any transcriptional errors that result from this process are unintentional.

## 2015-07-19 NOTE — Discharge Instructions (Signed)

## 2015-07-19 NOTE — Progress Notes (Addendum)
Patient discharged home per MD order. Discharge instructions given to patient, along with activity, follow up care, diet and medicines. Two prescriptions given to patient. 1 more prescription sent to patients pharmacy. Explained to patient. All questions answered. Patient verbalized understanding. All belongings packed up by the patient. Wife had no other questions, at the bedside to transport patient home. Patient left with wife via wheelchair with auxiliary staff.

## 2015-07-26 ENCOUNTER — Encounter: Payer: Self-pay | Admitting: *Deleted

## 2015-07-27 ENCOUNTER — Ambulatory Visit: Payer: Medicaid Other | Admitting: Anesthesiology

## 2015-07-27 ENCOUNTER — Encounter
Admission: RE | Disposition: A | Discharge status: Home / Self Care | Payer: Self-pay | Source: Ambulatory Visit | Attending: Gastroenterology

## 2015-07-27 ENCOUNTER — Ambulatory Visit
Admission: RE | Admit: 2015-07-27 | Discharge: 2015-07-27 | Disposition: A | Discharge status: Home / Self Care | Payer: Medicaid Other | Source: Ambulatory Visit | Attending: Gastroenterology | Admitting: Gastroenterology

## 2015-07-27 ENCOUNTER — Encounter: Payer: Self-pay | Admitting: Anesthesiology

## 2015-07-27 DIAGNOSIS — Z8572 Personal history of non-Hodgkin lymphomas: Secondary | ICD-10-CM | POA: Insufficient documentation

## 2015-07-27 DIAGNOSIS — Z87891 Personal history of nicotine dependence: Secondary | ICD-10-CM | POA: Insufficient documentation

## 2015-07-27 DIAGNOSIS — J45909 Unspecified asthma, uncomplicated: Secondary | ICD-10-CM | POA: Diagnosis not present

## 2015-07-27 DIAGNOSIS — K2270 Barrett's esophagus without dysplasia: Secondary | ICD-10-CM | POA: Diagnosis present

## 2015-07-27 DIAGNOSIS — N400 Enlarged prostate without lower urinary tract symptoms: Secondary | ICD-10-CM | POA: Diagnosis not present

## 2015-07-27 DIAGNOSIS — K210 Gastro-esophageal reflux disease with esophagitis: Secondary | ICD-10-CM | POA: Insufficient documentation

## 2015-07-27 DIAGNOSIS — I10 Essential (primary) hypertension: Secondary | ICD-10-CM | POA: Diagnosis not present

## 2015-07-27 DIAGNOSIS — Z86718 Personal history of other venous thrombosis and embolism: Secondary | ICD-10-CM | POA: Diagnosis not present

## 2015-07-27 DIAGNOSIS — J449 Chronic obstructive pulmonary disease, unspecified: Secondary | ICD-10-CM | POA: Diagnosis not present

## 2015-07-27 DIAGNOSIS — Z7901 Long term (current) use of anticoagulants: Secondary | ICD-10-CM | POA: Diagnosis not present

## 2015-07-27 HISTORY — PX: ESOPHAGOGASTRODUODENOSCOPY (EGD) WITH PROPOFOL: SHX5813

## 2015-07-27 SURGERY — ESOPHAGOGASTRODUODENOSCOPY (EGD) WITH PROPOFOL
Anesthesia: General

## 2015-07-27 MED ORDER — STERILE WATER FOR INJECTION IJ SOLN: 60.0000 mL | Freq: Once | RESPIRATORY_TRACT | Status: DC

## 2015-07-27 MED ORDER — IPRATROPIUM-ALBUTEROL 0.5-2.5 (3) MG/3ML IN SOLN
RESPIRATORY_TRACT | Status: AC
  Administered 2015-07-27: 3 mL
  Filled 2015-07-27: qty 3

## 2015-07-27 MED ORDER — LACTATED RINGERS IV SOLN
INTRAVENOUS | Status: DC | PRN
  Administered 2015-07-27: 09:00:00 via INTRAVENOUS

## 2015-07-27 MED ORDER — PROPOFOL 10 MG/ML IV BOLUS
INTRAVENOUS | Status: DC | PRN
  Administered 2015-07-27: 20 mg via INTRAVENOUS
  Administered 2015-07-27 (×2): 10 mg via INTRAVENOUS
  Administered 2015-07-27 (×2): 20 mg via INTRAVENOUS

## 2015-07-27 MED ORDER — SODIUM CHLORIDE 0.9 % IV SOLN: INTRAVENOUS | Status: DC

## 2015-07-27 MED ORDER — STERILE WATER FOR INJECTION IJ SOLN
Freq: Once | RESPIRATORY_TRACT | Status: DC
  Filled 2015-07-27: qty 3

## 2015-07-27 MED ORDER — SODIUM CHLORIDE 0.9 % IV SOLN
INTRAVENOUS | Status: DC
  Administered 2015-07-27: 1000 mL via INTRAVENOUS

## 2015-07-27 NOTE — Transfer of Care (Signed)
Immediate Anesthesia Transfer of Care Note  Patient: Alexander [name]  Procedure(s) Performed: Procedure(s): ESOPHAGOGASTRODUODENOSCOPY (EGD) WITH PROPOFOL (N/A)  Patient Location: PACU  Anesthesia Type:General  Level of Consciousness: awake, alert  and oriented  Airway & Oxygen Therapy: Patient Spontanous Breathing and Patient connected to nasal cannula oxygen  Post-op Assessment: Report given to RN and Post -op Vital signs reviewed and stable  Post vital signs: Reviewed and stable  Last Vitals:  Filed Vitals:   07/27/15 0659  Pulse: 72  Temp: 36.1 C  Resp: 16    Complications: No apparent anesthesia complications

## 2015-07-27 NOTE — Op Note (Addendum)
THIS EXAM WAS SENT IN ERROR

## 2015-07-27 NOTE — H&P (Signed)
Primary Care Physician:  Perrin Maltese, MD Primary Gastroenterologist:  Dr. Candace Cruise  Pre-Procedure History & Physical: HPI:  Alexander [name] is a 58 y.o. male is here for an EGD with Barrx.   Past Medical History  Diagnosis Date  . COPD (chronic obstructive pulmonary disease) ([place])   . Hypertension   . Migraine   . Asthma   . BPH (benign prostatic hyperplasia)   . Deviated nasal septum   . DVT (deep venous thrombosis) ([place])   . Mantle cell lymphoma [facility])     Past Surgical History  Procedure Laterality Date  . Throat surgery    . Barryx N/A   . Esophagogastroduodenoscopy (egd) with propofol N/A 02/23/2015    Procedure: ESOPHAGOGASTRODUODENOSCOPY (EGD) WITH PROPOFOL;  Surgeon: Hulen Luster, MD;  Location: [facility] ENDOSCOPY;  Service: Gastroenterology;  Laterality: N/A;  . Esophagogastroduodenoscopy (egd) with propofol N/A 05/04/2015    Procedure: ESOPHAGOGASTRODUODENOSCOPY (EGD) WITH PROPOFOL;  Surgeon: Hulen Luster, MD;  Location: [facility] ENDOSCOPY;  Service: Gastroenterology;  Laterality: N/A;  . Tonsillectomy      Prior to Admission medications   Medication Sig Start Date End Date Taking? Authorizing Provider  dicyclomine (BENTYL) 20 MG tablet Take 20 mg by mouth every 6 (six) hours as needed for spasms.    Yes Historical Provider, MD  docusate sodium (COLACE) 100 MG capsule Take 200 mg by mouth 2 (two) times daily as needed for mild constipation.   Yes Historical Provider, MD  fluticasone (FLONASE) 50 MCG/ACT nasal spray Place 2 sprays into both nostrils daily as needed for rhinitis.    Yes Historical Provider, MD  loperamide (IMODIUM) 2 MG capsule Take 2 mg by mouth as needed for diarrhea or loose stools.   Yes Historical Provider, MD  metoprolol succinate (TOPROL XL) 50 MG 24 hr tablet Take 50 mg by mouth daily.   Yes Historical Provider, MD  oxyCODONE (OXY IR/ROXICODONE) 5 MG immediate release tablet Take 2 tablets (10 mg total) by mouth every 4 (four) hours as needed for severe pain.  02/26/15  Yes Hulen Luster, MD  oxyCODONE 10 MG TABS Take 1 tablet (10 mg total) by mouth every 4 (four) hours as needed for severe pain. 05/19/15  Yes Bettey Costa, MD  promethazine (PHENERGAN) 25 MG suppository Place 1 suppository (25 mg total) rectally every 6 (six) hours as needed for nausea or vomiting. 04/25/15 04/24/16 Yes Loney Hering, MD  rivaroxaban (XARELTO) 20 MG TABS tablet Take 1 tablet (20 mg total) by mouth daily with supper. 07/18/15  Yes Bettey Costa, MD  senna (SENOKOT) 8.6 MG TABS tablet Take 2 tablets by mouth 2 (two) times daily as needed for mild constipation.   Yes Historical Provider, MD  SUMAtriptan (IMITREX) 50 MG tablet Take 50 mg by mouth every 2 (two) hours as needed for migraine.    Yes Historical Provider, MD  traZODone (DESYREL) 50 MG tablet Take 50 mg by mouth at bedtime.   Yes Historical Provider, MD  urea (CARMOL) 20 % cream Apply 1 application topically as needed (for dry skin on feet).    Yes Historical Provider, MD  azithromycin (ZITHROMAX) 250 MG tablet Take 1 tablet (250 mg total) by mouth daily. Patient not taking: Reported on 07/27/2015 07/19/15   Vaughan Basta, MD  cefUROXime (CEFTIN) 250 MG tablet Take 1 tablet (250 mg total) by mouth 2 (two) times daily with a meal. Patient not taking: Reported on 07/27/2015 07/19/15   Vaughan Basta, MD  feeding supplement (  BOOST / RESOURCE BREEZE) LIQD Take 1 Container by mouth 3 (three) times daily between meals. Patient not taking: Reported on 07/27/2015 07/18/15   Bettey Costa, MD  morphine (MSIR) 30 MG tablet Take 30 mg by mouth 3 (three) times daily.    Historical Provider, MD  OLANZapine (ZYPREXA) 5 MG tablet Take 2.5 mg by mouth every 6 (six) hours as needed (for GI upset).     Historical Provider, MD  ondansetron (ZOFRAN ODT) 4 MG disintegrating tablet Take 1 tablet (4 mg total) by mouth every 8 (eight) hours as needed for nausea or vomiting. Patient not taking: Reported on 07/27/2015 07/04/15   Harvest Dark, MD    Allergies as of 07/26/2015 - Review Complete 07/26/2015  Allergen Reaction Noted  . Ativan [lorazepam] Other (See Comments) 01/12/2015  . Bee venom  04/25/2015    Family History  Problem Relation Age of Onset  . Mesothelioma Father   . Breast cancer Mother     Social History   Social History  . Marital Status: Married    Spouse Name: N/A  . Number of Children: N/A  . Years of Education: N/A   Occupational History  . Not on file.   Social History Main Topics  . Smoking status: Former Research scientist (life sciences)  . Smokeless tobacco: Not on file  . Alcohol Use: No  . Drug Use: No  . Sexual Activity: Yes   Other Topics Concern  . Not on file   Social History Narrative    Review of Systems: See HPI, otherwise negative ROS  Physical Exam: Pulse 72  Temp(Src) 96.9 F (36.1 C) (Tympanic)  Resp 16  Ht 5\' 10"  (1.778 m)  Wt 81.647 kg (180 lb)  BMI 25.83 kg/m2 General:   Alert,  pleasant and cooperative in NAD Head:  Normocephalic and atraumatic. Neck:  Supple; no masses or thyromegaly. Lungs:  Clear throughout to auscultation.    Heart:  Regular rate and rhythm. Abdomen:  Soft, nontender and nondistended. Normal bowel sounds, without guarding, and without rebound.   Neurologic:  Alert and  oriented x4;  grossly normal neurologically.  Impression/Plan: Alexander [name] is here for an EGD with Barrx to be performed for Barrett's with dysplasia  Risks, benefits, limitations, and alternatives regarding  EGD have been reviewed with the patient.  Questions have been answered.  All parties agreeable.   [name], Lupita Dawn, MD  07/27/2015, 8:02 AM

## 2015-07-27 NOTE — Anesthesia Postprocedure Evaluation (Signed)
Anesthesia Post Note  Patient: Miquel Dunn  Procedure(s) Performed: Procedure(s) (LRB): ESOPHAGOGASTRODUODENOSCOPY (EGD) WITH PROPOFOL (N/A)  Patient location during evaluation: Endoscopy Anesthesia Type: General Level of consciousness: awake and alert Pain management: pain level controlled Vital Signs Assessment: post-procedure vital signs reviewed and stable Respiratory status: spontaneous breathing, nonlabored ventilation, respiratory function stable and patient connected to nasal cannula oxygen Cardiovascular status: blood pressure returned to baseline and stable Postop Assessment: no signs of nausea or vomiting Anesthetic complications: no    Last Vitals:  Filed Vitals:   07/27/15 0932 07/27/15 0940  BP: 101/55 119/63  Pulse: 43 63  Temp:    Resp: 11 11    Last Pain: There were no vitals filed for this visit.               Precious Haws [place]iscit[place]

## 2015-07-27 NOTE — Op Note (Signed)
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 S/P Barrx                     x 4. Providers:         Lupita Dawn. Candace Cruise, MD Referring MD:      Perrin Maltese, MD (Referring MD) Medicines:         Monitored Anesthesia Care Complications:     No immediate complications. Procedure:         Pre-Anesthesia Assessment:                    - Prior to the procedure, a History and Physical was                     performed, and patient medications, allergies and                     sensitivities were reviewed. The patient's tolerance of                     previous anesthesia was reviewed.                    - The risks and benefits of the procedure and the sedation                     options and risks were discussed with the patient. All                     questions were answered and informed consent was obtained.                    - After reviewing the risks and benefits, the patient was                     deemed in satisfactory condition to undergo the procedure.                    After obtaining informed consent, the endoscope was passed                     under direct vision. Throughout the procedure, the                     patient's blood pressure, pulse, and oxygen saturations                     were monitored continuously. The Endoscope was introduced                     through the mouth, and advanced to the second part of                     duodenum. The upper EUS was accomplished without  difficulty. The patient tolerated the procedure well. Findings:      LA Grade C (one  or more mucosal breaks continuous between tops of 2 or       more mucosal folds, less than 75% circumference) esophagitis with       bleeding was found in the entire esophagus. Dificult to distinguish       Barrett's mucosa from esophagiitis even on NBI. Also, raised area near       GE junction. Therefore, instead of proceeding with Barrx, biopsies were       taken GE junction area.      The exam of the esophagus was otherwise normal.      The entire examined stomach was normal.      The examined duodenum was normal. Impression:        - LA Grade C reflux esophagitis.                    - Normal stomach.                    - Normal examined duodenum.                    - No specimens collected. Recommendation:    - Discharge patient to home.                    - Await pathology results.                    - Observe patient's clinical course.                    - Continue present medications.                    - The findings and recommendations were discussed with the                     patient.                    - Needs aggressive anti reflux treatment.                    Schedule EUS to evaluate distal esophagus. Procedure Code(s): --- Professional ---                    [phone], Esophagogastroduodenoscopy, flexible, transoral;                     diagnostic, including collection of specimen(s) by                     brushing or washing, when performed (separate procedure) Diagnosis Code(s): --- Professional ---                    K21.0, Gastro-esophageal reflux disease with esophagitis                    K22.70, Barrett's esophagus without dysplasia CPT copyright 2014 American Medical Association. All rights reserved. The codes documented in this report are preliminary and upon coder review may  be revised to meet current compliance requirements. Hulen Luster, MD 07/27/2015 9:15:30 AM This report has been signed electronically. Number of Addenda: 0 Note Initiated On: 07/27/2015 8:44  AM      [facility]

## 2015-07-27 NOTE — Anesthesia Preprocedure Evaluation (Signed)
Anesthesia Evaluation  Patient identified by MRN, date of birth, ID band Patient awake    Reviewed: Allergy & Precautions, H&P , NPO status , Patient's Chart, lab work & pertinent test results  History of Anesthesia Complications Negative for: history of anesthetic complications  Airway Mallampati: III  TM Distance: >3 FB Neck ROM: limited    Dental  (+) Poor Dentition, Missing, Upper Dentures, Lower Dentures   Pulmonary neg shortness of breath, asthma , COPD, former smoker,    Pulmonary exam normal breath sounds clear to auscultation       Cardiovascular Exercise Tolerance: Good hypertension, (-) angina+ DOE  (-) Past MI Normal cardiovascular exam Rhythm:regular Rate:Normal     Neuro/Psych  Headaches, negative psych ROS   GI/Hepatic negative GI ROS, Neg liver ROS,   Endo/Other  negative endocrine ROS  Renal/GU negative Renal ROS  negative genitourinary   Musculoskeletal   Abdominal   Peds  Hematology negative hematology ROS (+)   Anesthesia Other Findings Past Medical History:   COPD (chronic obstructive pulmonary disease) (*              Hypertension                                                 Migraine                                                     Asthma                                                       BPH (benign prostatic hyperplasia)                           Deviated nasal septum                                        DVT (deep venous thrombosis) (HCC)                           Mantle cell lymphoma (HCC)                                  Past Surgical History:   THROAT SURGERY                                                barryx                                          N/A  ESOPHAGOGASTRODUODENOSCOPY (EGD) WITH PROPOFOL  N/A 02/23/2015       Comment:Procedure: ESOPHAGOGASTRODUODENOSCOPY (EGD)               WITH PROPOFOL;  Surgeon: Hulen Luster, MD;                Location: [facility]  ENDOSCOPY;  Service:               Gastroenterology;  Laterality: N/A;   ESOPHAGOGASTRODUODENOSCOPY (EGD) WITH PROPOFOL  N/A 05/04/2015      Comment:Procedure: ESOPHAGOGASTRODUODENOSCOPY (EGD)               WITH PROPOFOL;  Surgeon: Hulen Luster, MD;                Location: [facility] ENDOSCOPY;  Service:               Gastroenterology;  Laterality: N/A;   TONSILLECTOMY                                                BMI    Body Mass Index   25.82 kg/m 2      Reproductive/Obstetrics negative OB ROS                             Anesthesia Physical Anesthesia Plan  ASA: III  Anesthesia Plan: General   Post-op Pain Management:    Induction:   Airway Management Planned:   Additional Equipment:   Intra-op Plan:   Post-operative Plan:   Informed Consent: I have reviewed the patients History and Physical, chart, labs and discussed the procedure including the risks, benefits and alternatives for the proposed anesthesia with the patient or authorized representative who has indicated his/her understanding and acceptance.   Dental Advisory Given  Plan Discussed with: Anesthesiologist, CRNA and Surgeon  Anesthesia Plan Comments:         Anesthesia Quick Evaluation

## 2015-07-28 LAB — SURGICAL PATHOLOGY

## 2015-07-29 ENCOUNTER — Encounter: Payer: Self-pay | Admitting: Gastroenterology

## 2015-08-09 ENCOUNTER — Emergency Department: Payer: Medicaid Other

## 2015-08-09 ENCOUNTER — Emergency Department
Admission: EM | Admit: 2015-08-09 | Discharge: 2015-08-09 | Disposition: A | Discharge status: Home / Self Care | Payer: Medicaid Other | Attending: Emergency Medicine | Admitting: Emergency Medicine

## 2015-08-09 DIAGNOSIS — Z79899 Other long term (current) drug therapy: Secondary | ICD-10-CM | POA: Insufficient documentation

## 2015-08-09 DIAGNOSIS — R55 Syncope and collapse: Secondary | ICD-10-CM | POA: Insufficient documentation

## 2015-08-09 DIAGNOSIS — R531 Weakness: Secondary | ICD-10-CM | POA: Insufficient documentation

## 2015-08-09 DIAGNOSIS — R300 Dysuria: Secondary | ICD-10-CM | POA: Diagnosis not present

## 2015-08-09 DIAGNOSIS — I10 Essential (primary) hypertension: Secondary | ICD-10-CM | POA: Diagnosis not present

## 2015-08-09 DIAGNOSIS — R1030 Lower abdominal pain, unspecified: Secondary | ICD-10-CM | POA: Insufficient documentation

## 2015-08-09 DIAGNOSIS — R296 Repeated falls: Secondary | ICD-10-CM | POA: Insufficient documentation

## 2015-08-09 DIAGNOSIS — Z79891 Long term (current) use of opiate analgesic: Secondary | ICD-10-CM | POA: Diagnosis not present

## 2015-08-09 DIAGNOSIS — Z87891 Personal history of nicotine dependence: Secondary | ICD-10-CM | POA: Diagnosis not present

## 2015-08-09 LAB — COMPREHENSIVE METABOLIC PANEL
ALBUMIN: 4.1 g/dL (ref 3.5–5.0)
ALT: 11 U/L — AB (ref 17–63)
AST: 15 U/L (ref 15–41)
Alkaline Phosphatase: 86 U/L (ref 38–126)
Anion gap: 4 — ABNORMAL LOW (ref 5–15)
BUN: 11 mg/dL (ref 6–20)
CHLORIDE: 103 mmol/L (ref 101–111)
CO2: 29 mmol/L (ref 22–32)
CREATININE: 0.78 mg/dL (ref 0.61–1.24)
Calcium: 8.8 mg/dL — ABNORMAL LOW (ref 8.9–10.3)
GFR calc Af Amer: >60 mL/min (ref >60)
GFR calc non Af Amer: >60 mL/min (ref >60)
GLUCOSE: 105 mg/dL — AB (ref 65–99)
POTASSIUM: 3.6 mmol/L (ref 3.5–5.1)
SODIUM: 136 mmol/L (ref 135–145)
Total Bilirubin: 0.9 mg/dL (ref 0.3–1.2)
Total Protein: 6.7 g/dL (ref 6.5–8.1)

## 2015-08-09 LAB — CBC WITH DIFFERENTIAL/PLATELET
Basophils Absolute: 0 10*3/uL (ref 0–0.1)
Basophils Relative: 1 %
EOS ABS: 0.1 10*3/uL (ref 0–0.7)
EOS PCT: 2 %
HCT: 42.2 % (ref 40.0–52.0)
Hemoglobin: 14.1 g/dL (ref 13.0–18.0)
LYMPHS ABS: 1.4 10*3/uL (ref 1.0–3.6)
LYMPHS PCT: 23 %
MCH: 29.5 pg (ref 26.0–34.0)
MCHC: 33.5 g/dL (ref 32.0–36.0)
MCV: 88.1 fL (ref 80.0–100.0)
MONOS PCT: 9 %
Monocytes Absolute: 0.5 10*3/uL (ref 0.2–1.0)
Neutro Abs: 4.1 10*3/uL (ref 1.4–6.5)
Neutrophils Relative %: 65 %
PLATELETS: 140 10*3/uL — AB (ref 150–440)
RBC: 4.79 MIL/uL (ref 4.40–5.90)
RDW: 13.5 % (ref 11.5–14.5)
WBC: 6.2 10*3/uL (ref 3.8–10.6)

## 2015-08-09 LAB — URINALYSIS COMPLETE WITH MICROSCOPIC (ARMC ONLY)
BACTERIA UA: NONE SEEN
Bilirubin Urine: NEGATIVE
Glucose, UA: NEGATIVE mg/dL
HGB URINE DIPSTICK: NEGATIVE
Ketones, ur: NEGATIVE mg/dL
LEUKOCYTES UA: NEGATIVE
Nitrite: NEGATIVE
PROTEIN: NEGATIVE mg/dL
SPECIFIC GRAVITY, URINE: 1.01 (ref 1.005–1.030)
pH: 7 (ref 5.0–8.0)

## 2015-08-09 LAB — TROPONIN I: Troponin I: <0.03 ng/mL (ref <0.031)

## 2015-08-09 MED ORDER — MORPHINE SULFATE (PF) 4 MG/ML IV SOLN
4.0000 mg | Freq: Once | INTRAVENOUS | Status: AC
  Administered 2015-08-09: 4 mg via INTRAVENOUS
  Filled 2015-08-09: qty 1

## 2015-08-09 MED ORDER — ONDANSETRON HCL 4 MG/2ML IJ SOLN
4.0000 mg | Freq: Once | INTRAMUSCULAR | Status: AC
  Administered 2015-08-09: 4 mg via INTRAVENOUS
  Filled 2015-08-09: qty 2

## 2015-08-09 MED ORDER — SODIUM CHLORIDE 0.9 % IV BOLUS (SEPSIS)
1000.0000 mL | Freq: Once | INTRAVENOUS | Status: AC
  Administered 2015-08-09: 1000 mL via INTRAVENOUS

## 2015-08-09 NOTE — ED Notes (Signed)
Pt comes into the ED with wife with c/o generalized weakness with multuple falls in the past couple of weeks.. States was seen by MD on Monday for sx, states it is getting worse.. 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

## 2015-08-09 NOTE — ED Notes (Signed)
C/o generalized weakness, pt reports not eating or drinking.  C/o dizziness when standing

## 2015-08-09 NOTE — ED Notes (Signed)
Patient transported to X-ray 

## 2015-08-09 NOTE — Discharge Instructions (Signed)
You have been seen in the emergency department today for weakness and syncope (passing out). Please follow-up with cardiology as soon as possible by calling the number provided. Please drink plenty of fluids over the next several days. Your workup has not shown a clear cause of your symptoms. Please return to the emergency department if your symptoms worsen, he developed chest pain, trouble breathing, or any other symptom personally concerning to your self.   Near-Syncope Near-syncope (commonly known as near fainting) is sudden weakness, dizziness, or feeling like you might pass out. During an episode of near-syncope, you may also develop pale skin, have tunnel vision, or feel sick to your stomach (nauseous). Near-syncope may occur when getting up after sitting or while standing for a long time. It is caused by a sudden decrease in blood flow to the brain. This decrease can result from various causes or triggers, most of which are not serious. However, because near-syncope can sometimes be a sign of something serious, a medical evaluation is required. The specific cause is often not determined. HOME CARE INSTRUCTIONS  Monitor your condition for any changes. The following actions may help to alleviate any discomfort you are experiencing:  Have someone stay with you until you feel stable.  Lie down right away and prop your feet up if you start feeling like you might faint. Breathe deeply and steadily. Wait until all the symptoms have passed. Most of these episodes last only a few minutes. You may feel tired for several hours.   Drink enough fluids to keep your urine clear or pale yellow.   If you are taking blood pressure or heart medicine, get up slowly when seated or lying down. Take several minutes to sit and then stand. This can reduce dizziness.  Follow up with your health care provider as directed. SEEK IMMEDIATE MEDICAL CARE IF:   You have a severe headache.   You have unusual pain in  the chest, abdomen, or back.   You are bleeding from the mouth or rectum, or you have black or tarry stool.   You have an irregular or very fast heartbeat.   You have repeated fainting or have seizure-like jerking during an episode.   You faint when sitting or lying down.   You have confusion.   You have difficulty walking.   You have severe weakness.   You have vision problems.  MAKE SURE YOU:   Understand these instructions.  Will watch your condition.  Will get help right away if you are not doing well or get worse.   This information is not intended to replace advice given to you by your health care provider. Make sure you discuss any questions you have with your health care provider.   Document Released: 08/05/2005 Document Revised: 08/10/2013 Document Reviewed: 01/08/2013 Elsevier Interactive Patient Education 2016 Elsevier Inc.  Weakness Weakness is a lack of strength. You may feel weak all over your body or just in one part of your body. Weakness can be serious. In some cases, you may need more medical tests. HOME [place] a well-balanced diet.  Try to exercise every day.  Only take medicines as told by your doctor. GET HELP RIGHT AWAY IF:   You cannot do your normal daily activities.  You cannot walk up and down stairs, or you feel very tired when you do so.  You have shortness of breath or chest pain.  You have trouble moving parts of your body.  You have  weakness in only one body part or on only one side of the body.  You have a fever.  You have trouble speaking or swallowing.  You cannot control when you pee (urinate) or poop (bowel movement).  You have black or bloody throw up (vomit) or poop.  Your weakness gets worse or spreads to other body parts.  You have new aches or pains. MAKE SURE YOU:   Understand these instructions.  Will watch your condition.  Will get help right away if you are not doing well or get  worse.   This information is not intended to replace advice given to you by your health care provider. Make sure you discuss any questions you have with your health care provider.   Document Released: 07/18/2008 Document Revised: 02/04/2012 Document Reviewed: 10/04/2011 Elsevier Interactive Patient Education [employer].

## 2015-08-09 NOTE — ED Provider Notes (Signed)
Signout from Dr. Kerman Passey to follow-up with urinalysis for this patient. Patient has been diagnosed with orthostatic hypotension.  Thought is that the presentation is likely secondary to his ongoing orthostatic hypotension. Patient appropriate for home as long as the urinalysis is reassuring. Physical Exam  BP 142/83 mmHg  Pulse 56  Temp(Src) 98.4 F (36.9 C) (Oral)  Resp 17  Ht 5\' 10"  (1.778 m)  Wt 180 lb (81.647 kg)  BMI 25.83 kg/m2  SpO2 97% ----------------------------------------- 9:49 PM on 08/09/2015 -----------------------------------------   Physical Exam Patient resting comfortably at this time. Heart rate in the 0000000 and systolic 0000000 on the monitor. ED Course  Procedures  MDM Updated patient family about urinalysis results. We'll discharge to home as per original plan.      Orbie Pyo, MD 08/09/15 2150

## 2015-08-09 NOTE — ED Provider Notes (Signed)
[facility] Emergency Department Provider Note  Time seen: 7:17 PM  I have reviewed the triage vital signs and the nursing notes.   HISTORY  Chief Complaint Weakness    HPI Alexander [name] is a 59 y.o. male with a past medical history of COPD, hypertension, asthma, lymphoma, presents the emergency department with generalized weakness. According to the patient and family and the patient states he has been very off-balance, feeling like he is going to pass out. He has seen his doctor and was diagnosed with orthostatic hypotension. Denies any recent chest pain, pressure. He does state some mild lower abdominal pain with mild dysuria over the past several weeks. States his doctor put him on Flomax for a likely enlarged prostate. Patient denies any focal weakness or numbness. Denies headache. Feels the weakness is more a feeling that he is going to pass out that it is a feeling of dizziness. States at times he does pass out.     Past Medical History  Diagnosis Date  . COPD (chronic obstructive pulmonary disease) ([place])   . Hypertension   . Migraine   . Asthma   . BPH (benign prostatic hyperplasia)   . Deviated nasal septum   . DVT (deep venous thrombosis) ([place])   . Mantle cell lymphoma [facility])     Patient Active Problem List   Diagnosis Date Noted  . Abdominal pain 07/17/2015  . Nausea & vomiting 07/17/2015  . Malnutrition of moderate degree 07/17/2015  . Diarrhea 05/18/2015  . DEVIATED SEPTUM 05/18/2007  . ALLERGIC RHINITIS 05/18/2007  . ASTHMA 05/18/2007    Past Surgical History  Procedure Laterality Date  . Throat surgery    . Barryx N/A   . Esophagogastroduodenoscopy (egd) with propofol N/A 02/23/2015    Procedure: ESOPHAGOGASTRODUODENOSCOPY (EGD) WITH PROPOFOL;  Surgeon: Hulen Luster, MD;  Location[facility] ENDOSCOPY;  Service: Gastroenterology;  Laterality: N/A;  . Esophagogastroduodenoscopy (egd) with propofol N/A 05/04/2015    Procedure:  ESOPHAGOGASTRODUODENOSCOPY (EGD) WITH PROPOFOL;  Surgeon: Hulen Luster, MD;  Location: [facility] ENDOSCOPY;  Service: Gastroenterology;  Laterality: N/A;  . Tonsillectomy    . Esophagogastroduodenoscopy (egd) with propofol N/A 07/27/2015    Procedure: ESOPHAGOGASTRODUODENOSCOPY (EGD) WITH PROPOFOL;  Surgeon: Hulen Luster, MD;  Location: [facility] ENDOSCOPY;  Service: Gastroenterology;  Laterality: N/A;    Current Outpatient Rx  Name  Route  Sig  Dispense  Refill  . azithromycin (ZITHROMAX) 250 MG tablet   Oral   Take 1 tablet (250 mg total) by mouth daily. Patient not taking: Reported on 07/27/2015   4 each   0   . cefUROXime (CEFTIN) 250 MG tablet   Oral   Take 1 tablet (250 mg total) by mouth 2 (two) times daily with a meal. Patient not taking: Reported on 07/27/2015   8 tablet   0   . dicyclomine (BENTYL) 20 MG tablet   Oral   Take 20 mg by mouth every 6 (six) hours as needed for spasms.          Marland Kitchen docusate sodium (COLACE) 100 MG capsule   Oral   Take 200 mg by mouth 2 (two) times daily as needed for mild constipation.         . feeding supplement (BOOST / RESOURCE BREEZE) LIQD   Oral   Take 1 Container by mouth 3 (three) times daily between meals. Patient not taking: Reported on 07/27/2015   1 Container   0   . fluticasone (FLONASE) 50 MCG/ACT  nasal spray   Each Nare   Place 2 sprays into both nostrils daily as needed for rhinitis.          Marland Kitchen loperamide (IMODIUM) 2 MG capsule   Oral   Take 2 mg by mouth as needed for diarrhea or loose stools.         . metoprolol succinate (TOPROL XL) 50 MG 24 hr tablet   Oral   Take 50 mg by mouth daily.         Marland Kitchen morphine (MSIR) 30 MG tablet   Oral   Take 30 mg by mouth 3 (three) times daily.         Marland Kitchen OLANZapine (ZYPREXA) 5 MG tablet   Oral   Take 2.5 mg by mouth every 6 (six) hours as needed (for GI upset).          . ondansetron (ZOFRAN ODT) 4 MG disintegrating tablet   Oral   Take 1 tablet (4 mg total) by mouth every 8  (eight) hours as needed for nausea or vomiting. Patient not taking: Reported on 07/27/2015   20 tablet   0   . oxyCODONE (OXY IR/ROXICODONE) 5 MG immediate release tablet   Oral   Take 2 tablets (10 mg total) by mouth every 4 (four) hours as needed for severe pain.   30 tablet   0   . oxyCODONE 10 MG TABS   Oral   Take 1 tablet (10 mg total) by mouth every 4 (four) hours as needed for severe pain.   15 tablet   0   . promethazine (PHENERGAN) 25 MG suppository   Rectal   Place 1 suppository (25 mg total) rectally every 6 (six) hours as needed for nausea or vomiting.   12 suppository   0   . rivaroxaban (XARELTO) 20 MG TABS tablet   Oral   Take 1 tablet (20 mg total) by mouth daily with supper.   30 tablet   0   . senna (SENOKOT) 8.6 MG TABS tablet   Oral   Take 2 tablets by mouth 2 (two) times daily as needed for mild constipation.         . SUMAtriptan (IMITREX) 50 MG tablet   Oral   Take 50 mg by mouth every 2 (two) hours as needed for migraine.          . traZODone (DESYREL) 50 MG tablet   Oral   Take 50 mg by mouth at bedtime.         . urea (CARMOL) 20 % cream   Topical   Apply 1 application topically as needed (for dry skin on feet).            Allergies Ativan and Bee venom  Family History  Problem Relation Age of Onset  . Mesothelioma Father   . Breast cancer Mother     Social History Social History  Substance Use Topics  . Smoking status: Former Research scientist (life sciences)  . Smokeless tobacco: None  . Alcohol Use: No    Review of Systems Constitutional: Negative for fever. Positive for generalized weakness. Cardiovascular: Negative for chest pain. Respiratory: Negative for shortness of breath. Gastrointestinal: Negative for abdominal pain Neurological: Negative for headache 10-point ROS otherwise negative.  ____________________________________________   PHYSICAL EXAM:  VITAL SIGNS: ED Triage Vitals  Enc Vitals Group     BP 08/09/15 1858  117/78 mmHg     Pulse Rate 08/09/15 1858 67     Resp 08/09/15 1858  18     Temp 08/09/15 1858 98.4 F (36.9 C)     Temp Source 08/09/15 1858 Oral     SpO2 08/09/15 1858 99 %     Weight 08/09/15 1858 180 lb (81.647 kg)     Height 08/09/15 1858 5\' 10"  (1.778 m)     Head Cir --      Peak Flow --      Pain Score 08/09/15 1859 8     Pain Loc --      Pain Edu? --      Excl. in [place]? --     Constitutional: Alert and oriented. Well appearing and in no distress. Eyes: Normal exam ENT   Head: Normocephalic and atraumatic.   Mouth/Throat: Mucous membranes, edentulous Cardiovascular: Normal rate, regular rhythm. No murmur Respiratory: Normal respiratory effort without tachypnea nor retractions. Breath sounds are clear Gastrointestinal: Soft, minimal suprapubic tenderness palpation. No rebound or guarding. No distention. Musculoskeletal: Nontender with normal range of motion in all extremities. Neurologic:  Normal speech and language. No gross focal neurologic deficits  Skin:  Skin is warm, dry and intact.  Psychiatric: Mood and affect are normal. Speech and behavior are normal.  ____________________________________________    EKG  EKG reviewed and interpreted by myself shows normal sinus rhythm at 60 bpm, narrow QRS, normal axis, normal intervals, no concerning ST changes noted.  INITIAL IMPRESSION / ASSESSMENT AND PLAN / ED COURSE  Pertinent labs & imaging results that were available during my care of the patient were reviewed by me and considered in my medical decision making (see chart for details).  Patient presents with generalized weakness for the past several weeks with several recent falls. States he has been seeing his primary care doctor and has been diagnosed with orthostatic hypotension. Family states the patient is not eating much or drinking much, has lost weight. Patient states he feels lightheaded like he is going to pass out all the time and several times over the  past few weeks he has completely passed out. Denies any focal complaints such as chest pain, focal weakness or numbness. We'll check labs, CT head, chest x-ray and monitor closely in the emergency department.  Labs are within normal limits, vitals are within normal limits. CT head negative. Urinalysis pending. Patient care signed out to Dr. Clearnce Hasten.  ____________________________________________   FINAL CLINICAL IMPRESSION(S) / ED DIAGNOSES Near-syncope Syncope Falls    Harvest Dark, MD 08/09/15 2116

## 2015-08-09 NOTE — ED Notes (Signed)
Patient transported to CT 

## 2015-08-10 ENCOUNTER — Observation Stay
Admission: EM | Admit: 2015-08-10 | Discharge: 2015-08-12 | Disposition: A | Discharge status: Home / Self Care | Payer: Medicaid Other | Attending: Internal Medicine | Admitting: Internal Medicine

## 2015-08-10 ENCOUNTER — Observation Stay: Payer: Medicaid Other

## 2015-08-10 ENCOUNTER — Encounter: Payer: Self-pay | Admitting: Internal Medicine

## 2015-08-10 ENCOUNTER — Emergency Department: Payer: Medicaid Other

## 2015-08-10 ENCOUNTER — Other Ambulatory Visit: Payer: Self-pay

## 2015-08-10 ENCOUNTER — Observation Stay
Admit: 2015-08-10 | Discharge: 2015-08-10 | Disposition: A | Discharge status: Home / Self Care | Payer: Medicaid Other | Attending: Internal Medicine | Admitting: Internal Medicine

## 2015-08-10 DIAGNOSIS — N400 Enlarged prostate without lower urinary tract symptoms: Secondary | ICD-10-CM | POA: Insufficient documentation

## 2015-08-10 DIAGNOSIS — Z7901 Long term (current) use of anticoagulants: Secondary | ICD-10-CM | POA: Diagnosis not present

## 2015-08-10 DIAGNOSIS — Z87891 Personal history of nicotine dependence: Secondary | ICD-10-CM | POA: Diagnosis not present

## 2015-08-10 DIAGNOSIS — R51 Headache: Secondary | ICD-10-CM | POA: Diagnosis not present

## 2015-08-10 DIAGNOSIS — R42 Dizziness and giddiness: Secondary | ICD-10-CM | POA: Insufficient documentation

## 2015-08-10 DIAGNOSIS — R0781 Pleurodynia: Secondary | ICD-10-CM | POA: Diagnosis not present

## 2015-08-10 DIAGNOSIS — J449 Chronic obstructive pulmonary disease, unspecified: Secondary | ICD-10-CM | POA: Diagnosis not present

## 2015-08-10 DIAGNOSIS — J3489 Other specified disorders of nose and nasal sinuses: Secondary | ICD-10-CM | POA: Insufficient documentation

## 2015-08-10 DIAGNOSIS — Z888 Allergy status to other drugs, medicaments and biological substances status: Secondary | ICD-10-CM | POA: Diagnosis not present

## 2015-08-10 DIAGNOSIS — E876 Hypokalemia: Secondary | ICD-10-CM | POA: Insufficient documentation

## 2015-08-10 DIAGNOSIS — I951 Orthostatic hypotension: Secondary | ICD-10-CM | POA: Diagnosis not present

## 2015-08-10 DIAGNOSIS — J45909 Unspecified asthma, uncomplicated: Secondary | ICD-10-CM | POA: Diagnosis not present

## 2015-08-10 DIAGNOSIS — G20 Parkinson's disease: Secondary | ICD-10-CM | POA: Insufficient documentation

## 2015-08-10 DIAGNOSIS — W19XXXA Unspecified fall, initial encounter: Secondary | ICD-10-CM | POA: Diagnosis not present

## 2015-08-10 DIAGNOSIS — E43 Unspecified severe protein-calorie malnutrition: Secondary | ICD-10-CM | POA: Insufficient documentation

## 2015-08-10 DIAGNOSIS — R531 Weakness: Secondary | ICD-10-CM | POA: Diagnosis not present

## 2015-08-10 DIAGNOSIS — C8310 Mantle cell lymphoma, unspecified site: Secondary | ICD-10-CM | POA: Diagnosis not present

## 2015-08-10 DIAGNOSIS — I10 Essential (primary) hypertension: Secondary | ICD-10-CM | POA: Insufficient documentation

## 2015-08-10 DIAGNOSIS — R296 Repeated falls: Secondary | ICD-10-CM | POA: Insufficient documentation

## 2015-08-10 DIAGNOSIS — R55 Syncope and collapse: Principal | ICD-10-CM | POA: Diagnosis present

## 2015-08-10 DIAGNOSIS — Z91030 Bee allergy status: Secondary | ICD-10-CM | POA: Insufficient documentation

## 2015-08-10 DIAGNOSIS — Z79899 Other long term (current) drug therapy: Secondary | ICD-10-CM | POA: Diagnosis not present

## 2015-08-10 DIAGNOSIS — Z6825 Body mass index (BMI) 25.0-25.9, adult: Secondary | ICD-10-CM | POA: Insufficient documentation

## 2015-08-10 DIAGNOSIS — R569 Unspecified convulsions: Secondary | ICD-10-CM

## 2015-08-10 DIAGNOSIS — Z86718 Personal history of other venous thrombosis and embolism: Secondary | ICD-10-CM | POA: Insufficient documentation

## 2015-08-10 LAB — BASIC METABOLIC PANEL
Anion gap: 5 (ref 5–15)
BUN: 12 mg/dL (ref 6–20)
CO2: 27 mmol/L (ref 22–32)
Calcium: 8.4 mg/dL — ABNORMAL LOW (ref 8.9–10.3)
Chloride: 100 mmol/L — ABNORMAL LOW (ref 101–111)
Creatinine, Ser: 0.9 mg/dL (ref 0.61–1.24)
GFR calc Af Amer: >60 mL/min (ref >60)
GFR calc non Af Amer: >60 mL/min (ref >60)
Glucose, Bld: 106 mg/dL — ABNORMAL HIGH (ref 65–99)
Potassium: 3.2 mmol/L — ABNORMAL LOW (ref 3.5–5.1)
Sodium: 132 mmol/L — ABNORMAL LOW (ref 135–145)

## 2015-08-10 LAB — CBC
HEMATOCRIT: 40.1 % (ref 40.0–52.0)
HEMOGLOBIN: 13.9 g/dL (ref 13.0–18.0)
MCH: 30.4 pg (ref 26.0–34.0)
MCHC: 34.6 g/dL (ref 32.0–36.0)
MCV: 87.9 fL (ref 80.0–100.0)
Platelets: 142 10*3/uL — ABNORMAL LOW (ref 150–440)
RBC: 4.56 MIL/uL (ref 4.40–5.90)
RDW: 13.1 % (ref 11.5–14.5)
WBC: 6.3 10*3/uL (ref 3.8–10.6)

## 2015-08-10 LAB — TROPONIN I
Troponin I: <0.03 ng/mL (ref <0.031)
Troponin I: <0.03 ng/mL (ref <0.031)

## 2015-08-10 MED ORDER — GADOBENATE DIMEGLUMINE 529 MG/ML IV SOLN
17.0000 mL | Freq: Once | INTRAVENOUS | Status: AC | PRN
  Administered 2015-08-10: 17 mL via INTRAVENOUS

## 2015-08-10 MED ORDER — OXYCODONE HCL 5 MG PO TABS
10.0000 mg | ORAL_TABLET | ORAL | Status: DC | PRN
  Administered 2015-08-10 – 2015-08-12 (×3): 10 mg via ORAL
  Filled 2015-08-10 (×3): qty 2

## 2015-08-10 MED ORDER — LEVETIRACETAM 500 MG PO TABS: 500.0000 mg | ORAL_TABLET | Freq: Two times a day (BID) | ORAL | Status: DC

## 2015-08-10 MED ORDER — METOPROLOL SUCCINATE ER 50 MG PO TB24
50.0000 mg | ORAL_TABLET | Freq: Every day | ORAL | Status: DC
Start: 2015-08-10 — End: 2015-08-11
  Administered 2015-08-10 – 2015-08-11 (×2): 50 mg via ORAL
  Filled 2015-08-10 (×2): qty 1

## 2015-08-10 MED ORDER — DOCUSATE SODIUM 100 MG PO CAPS: 200.0000 mg | ORAL_CAPSULE | Freq: Two times a day (BID) | ORAL | Status: DC | PRN

## 2015-08-10 MED ORDER — FLUTICASONE PROPIONATE 50 MCG/ACT NA SUSP: 2.0000 | Freq: Every day | NASAL | Status: DC | PRN

## 2015-08-10 MED ORDER — ONDANSETRON HCL 4 MG/2ML IJ SOLN
4.0000 mg | Freq: Four times a day (QID) | INTRAMUSCULAR | Status: DC | PRN
  Administered 2015-08-12: 4 mg via INTRAVENOUS
  Filled 2015-08-10 (×2): qty 2

## 2015-08-10 MED ORDER — SODIUM CHLORIDE 0.9 % IV SOLN
INTRAVENOUS | Status: DC
Start: 2015-08-10 — End: 2015-08-12
  Administered 2015-08-10: 11:00:00 via INTRAVENOUS

## 2015-08-10 MED ORDER — ONDANSETRON HCL 4 MG PO TABS: 4.0000 mg | ORAL_TABLET | Freq: Four times a day (QID) | ORAL | Status: DC | PRN

## 2015-08-10 MED ORDER — MORPHINE SULFATE 15 MG PO TABS
30.0000 mg | ORAL_TABLET | Freq: Three times a day (TID) | ORAL | Status: DC
  Administered 2015-08-10 – 2015-08-12 (×7): 30 mg via ORAL
  Filled 2015-08-10 (×7): qty 2

## 2015-08-10 MED ORDER — TRAZODONE HCL 50 MG PO TABS
50.0000 mg | ORAL_TABLET | Freq: Every day | ORAL | Status: DC
  Administered 2015-08-10 – 2015-08-11 (×2): 50 mg via ORAL
  Filled 2015-08-10 (×2): qty 1

## 2015-08-10 MED ORDER — HYDROMORPHONE HCL 1 MG/ML IJ SOLN
2.0000 mg | Freq: Four times a day (QID) | INTRAMUSCULAR | Status: DC | PRN
  Administered 2015-08-10 – 2015-08-12 (×7): 2 mg via INTRAVENOUS
  Filled 2015-08-10: qty 1
  Filled 2015-08-10 (×2): qty 2
  Filled 2015-08-10: qty 1
  Filled 2015-08-10 (×4): qty 2

## 2015-08-10 MED ORDER — CARBIDOPA-LEVODOPA 25-100 MG PO TABS
1.0000 | ORAL_TABLET | Freq: Three times a day (TID) | ORAL | Status: DC
  Administered 2015-08-10 – 2015-08-11 (×3): 1 via ORAL
  Filled 2015-08-10 (×4): qty 1

## 2015-08-10 MED ORDER — RIVAROXABAN 20 MG PO TABS
20.0000 mg | ORAL_TABLET | Freq: Every day | ORAL | Status: DC
  Administered 2015-08-10 – 2015-08-11 (×2): 20 mg via ORAL
  Filled 2015-08-10 (×2): qty 1

## 2015-08-10 MED ORDER — UREA 20 % EX CREA: 1.0000 "application " | TOPICAL_CREAM | CUTANEOUS | Status: DC | PRN

## 2015-08-10 MED ORDER — MIDODRINE HCL 5 MG PO TABS
2.5000 mg | ORAL_TABLET | Freq: Two times a day (BID) | ORAL | Status: DC
  Administered 2015-08-10 – 2015-08-12 (×4): 2.5 mg via ORAL
  Filled 2015-08-10 (×4): qty 1

## 2015-08-10 MED ORDER — SODIUM CHLORIDE 0.9 % IJ SOLN
3.0000 mL | Freq: Two times a day (BID) | INTRAMUSCULAR | Status: DC
  Administered 2015-08-10 – 2015-08-12 (×3): 3 mL via INTRAVENOUS

## 2015-08-10 MED ORDER — POTASSIUM CHLORIDE CRYS ER 20 MEQ PO TBCR
20.0000 meq | EXTENDED_RELEASE_TABLET | Freq: Once | ORAL | Status: AC
  Administered 2015-08-10: 20 meq via ORAL
  Filled 2015-08-10: qty 1

## 2015-08-10 MED ORDER — MORPHINE SULFATE (PF) 2 MG/ML IV SOLN
INTRAVENOUS | Status: AC
  Administered 2015-08-10: 2 mg via INTRAVENOUS
  Filled 2015-08-10: qty 1

## 2015-08-10 MED ORDER — MORPHINE SULFATE (PF) 2 MG/ML IV SOLN
2.0000 mg | Freq: Once | INTRAVENOUS | Status: AC
  Administered 2015-08-10: 2 mg via INTRAVENOUS

## 2015-08-10 MED ORDER — SODIUM CHLORIDE 0.9 % IV SOLN
1000.0000 mg | Freq: Once | INTRAVENOUS | Status: AC
  Administered 2015-08-10: 1000 mg via INTRAVENOUS
  Filled 2015-08-10: qty 10

## 2015-08-10 NOTE — Progress Notes (Signed)
[facility] [place] NAME: Alexander [name]    MR#:  [id number]  DATE OF BIRTH:  October 30, 1955  SUBJECTIVE:  CHIEF COMPLAINT:   Chief Complaint  Patient presents with  . Loss of Consciousness   - Recurrent falls lately at home.  Possible seizure activity described by wife on the last fall. No prior seizures - asymptomatic now  REVIEW OF SYSTEMS:  Review of Systems  Constitutional: Negative for fever, chills and weight loss.  HENT: Negative for ear discharge, ear pain and nosebleeds.   Eyes: Negative for blurred vision.  Respiratory: Negative for cough, shortness of breath and wheezing.   Cardiovascular: Negative for chest pain, palpitations and leg swelling.  Gastrointestinal: Negative for nausea, vomiting, abdominal pain, diarrhea and constipation.  Genitourinary: Negative for dysuria.  Musculoskeletal: Positive for myalgias, joint pain and falls.       Right rib cage pain  Neurological: Positive for tremors, seizures and weakness. Negative for dizziness, tingling, speech change, focal weakness and headaches.  Psychiatric/Behavioral: Negative for depression.    DRUG ALLERGIES:   Allergies  Allergen Reactions  . Ativan [Lorazepam] Other (See Comments)    Reaction:  Dizziness   . Bee Venom     VITALS:  Blood pressure 114/78, pulse 78, temperature 98.3 F (36.8 C), temperature source Oral, resp. rate 18, height 5\' 10"  (1.778 m), weight 81.647 kg (180 lb), SpO2 100 %.  PHYSICAL EXAMINATION:  Physical Exam  GENERAL:  59 y.o.-year-old patient lying in the bed with no acute distress.  EYES: Pupils equal, round, reactive to light and accommodation. No scleral icterus. Extraocular muscles intact.  HEENT: Head atraumatic, normocephalic. Oropharynx and nasopharynx clear.  NECK:  Supple, no jugular venous distention. No thyroid enlargement, no tenderness.  LUNGS: Normal breath sounds bilaterally, no wheezing, rales,rhonchi or  crepitation. No use of accessory muscles of respiration.  CARDIOVASCULAR: S1, S2 normal. No murmurs, rubs, or gallops.  ABDOMEN: Soft, nontender, nondistended. Bowel sounds present. No organomegaly or mass.  EXTREMITIES: No pedal edema, cyanosis, or clubbing.  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 Muscle strength 5/5 in all extremities. Sensation intact. Gait not checked.  PSYCHIATRIC: The patient is alert and oriented x 3.  SKIN: No obvious rash, lesion, or ulcer.    LABORATORY PANEL:   CBC  Recent Labs Lab 08/10/15 0144  WBC 6.3  HGB 13.9  HCT 40.1  PLT 142*   ------------------------------------------------------------------------------------------------------------------  Chemistries   Recent Labs Lab 08/09/15 1921 08/10/15 0144  NA 136 132*  K 3.6 3.2*  CL 103 100*  CO2 29 27  GLUCOSE 105* 106*  BUN 11 12  CREATININE 0.78 0.90  CALCIUM 8.8* 8.4*  AST 15  --   ALT 11*  --   ALKPHOS 86  --   BILITOT 0.9  --    ------------------------------------------------------------------------------------------------------------------  Cardiac Enzymes  Recent Labs Lab 08/10/15 1106  TROPONINI <0.03   ------------------------------------------------------------------------------------------------------------------  RADIOLOGY:  Dg Chest 2 View  08/10/2015  CLINICAL DATA:  Fall with right rib pain EXAM: CHEST  2 VIEW COMPARISON:  08/09/2015 FINDINGS: Normal heart size and mediastinal contours. Calcified granuloma in the right upper lobe. No acute infiltrate or edema. No effusion or pneumothorax. No acute osseous findings. IMPRESSION: No acute finding. Electronically Signed   By: Monte Fantasia M.D.   On: 08/10/2015 02:33   Dg Chest 2 View  08/09/2015  CLINICAL DATA:  Generalized weakness with multiple falls. EXAM: CHEST  2 VIEW COMPARISON:   05/19/2015 FINDINGS: The heart size and mediastinal contours are within normal limits. Both lungs are clear. The visualized skeletal structures are unremarkable. IMPRESSION: No active cardiopulmonary disease. Electronically Signed   By: Kathreen Devoid   On: 08/09/2015 19:56   Ct Head Wo Contrast  08/09/2015  CLINICAL DATA:  Generalized weakness, multiple falls EXAM: CT HEAD WITHOUT CONTRAST TECHNIQUE: Contiguous axial images were obtained from the base of the skull through the vertex without contrast. COMPARISON:  08/29/2014 FINDINGS: Normal appearance of the intracranial structures. No evidence for acute hemorrhage, mass lesion, midline shift, hydrocephalus or large infarct. No acute bony abnormality. The visualized sinuses demonstrate chronic mucosal thickening diffusely, improved compared 08/29/2014. Mastoids are clear. No acute osseous finding or skull abnormality. Orbits are symmetric. IMPRESSION: No acute intracranial abnormality.  Stable exam. Improving chronic sinus disease. Electronically Signed   By: Jerilynn Mages.  Shick M.D.   On: 08/09/2015 19:50   US Carotid Bilateral  08/10/2015  CLINICAL DATA:  Syncopal episodes for 2 weeks with visual disturbance EXAM: BILATERAL CAROTID DUPLEX ULTRASOUND TECHNIQUE: Pearline Cables scale imaging, color Doppler and duplex ultrasound were performed of bilateral carotid and vertebral arteries in the neck. COMPARISON:  None. FINDINGS: Criteria: Quantification of carotid stenosis is based on velocity parameters that correlate the residual internal carotid diameter with NASCET-based stenosis levels, using the diameter of the distal internal carotid lumen as the denominator for stenosis measurement. The following velocity measurements were obtained: RIGHT ICA:  65/19 cm/sec CCA:  99991111 cm/sec SYSTOLIC ICA/CCA RATIO:  0.6 DIASTOLIC ICA/CCA RATIO:  0.4 ECA:  88 cm/sec LEFT ICA:  75/14 cm/sec CCA:  [id number] cm/sec SYSTOLIC ICA/CCA RATIO:  0.7 DIASTOLIC ICA/CCA RATIO:  0.7 ECA:  100 cm/sec RIGHT  CAROTID ARTERY: Minor atherosclerotic plaque formation. No hemodynamically significant right ICA stenosis, velocity elevation, or turbulent flow. Degree of narrowing less than 50%. RIGHT VERTEBRAL ARTERY:  Antegrade LEFT CAROTID ARTERY: Similar scattered minor atherosclerotic plaque formation. No hemodynamically significant left ICA stenosis, velocity elevation, or turbulent flow. LEFT VERTEBRAL ARTERY:  Antegrade IMPRESSION: Minor carotid atherosclerosis. No hemodynamically significant ICA stenosis. Degree of narrowing less than 50% bilaterally. Patent antegrade vertebral flow bilaterally. Electronically Signed   By: Jerilynn Mages.  Shick M.D.   On: 08/10/2015 11:35    EKG:   Orders placed or performed during the hospital encounter of 08/10/15  . ED EKG  . ED EKG    ASSESSMENT AND PLAN:   59 year old male with past medical history significant for hypertension, COPD, DVT on Xarelto, mantle cell lymphoma presents to the hospital secondary to recurrent falls.  #1 recurrent falls with syncope-questionable seizure activity noticed by family yesterday. -No traumatic brain injury history. No prior seizure history. -Usually confused after these episodes, feels the aura of these episodes coming on. No tonic-clonic activity reported. -He did receive a loading dose of Keppra in the emergency room. -Does feel dizzy prior to most of these episodes. Orthostatic vital signs ordered. -MRI of the brain with and without contrast especially with his history of lymphoma. -EEG ordered. Neurology consulted. -Possible postural orthostatic hypotension, underlying Parkinson's possibility explained to patient. -Gentle hydration, physical therapy. -Neurologist started him on low-dose Sinemet and also midodrine.  #2 mantle cell lymphoma-follows up with oncology. Has chronic bone pain. -Continue his MS Contin 3 times a day and also on oxycodone as needed. -Had Dilaudid for severe pain from his falls.  #3 COPD-appears stable.  Continue home  inhalers.  #4 history of DVT-on Xarelto.  #5 hypertension-continue Toprol.  #6 hypokalemia-being replaced.  #7 DVT prophylaxis-patient already on Xarelto    All the records are reviewed and case discussed with Care Management/Social Workerr. Management plans discussed with the patient, family and they are in agreement.  CODE STATUS: Full code  TOTAL TIME TAKING CARE OF THIS PATIENT: 37 minutes.   POSSIBLE D/C IN 2 DAYS, DEPENDING ON CLINICAL CONDITION.   Gladstone Lighter M.D on 08/10/2015 at 3:15 PM  Between 7am to 6pm - Pager - [phone]  After 6pm go to [email or web address] - password [email or web address] [facility]  Office  [phone]  CC: Primary care physician; Perrin Maltese, MD

## 2015-08-10 NOTE — ED Notes (Signed)
Pt. Transported to EEG.

## 2015-08-10 NOTE — Care Management (Signed)
Patient admitted from home with syncope.  Patient lives at home with his wife and older son.  Patient obtains his medication from [facility] on [address].  Patient has a cane, walker and wheel chair at home.  Patient states that he uses cane and walker for ambulation.  Patient's wife transports him to appointments.  Patient states that he has had home health benefits in the past year through the [place].  History and face sheet was faxed to Rosanne Sack at the [place].  RNCM following for discharge disposition.

## 2015-08-10 NOTE — Progress Notes (Signed)
PT Attempt Note  Patient Details Name: Alexander [name] MRN: [id number] DOB: 1956-01-31   Cancelled Treatment:    Reason Eval/Treat Not Completed: Patient at procedure or test/unavailable. Chart reviewed and RN consulted. Attempted to evaluate pt but he is currently out of room for MRI. Will attempt on later date as pt is available/appropriate.  Lyndel Safe Kennedee Kitzmiller PT, DPT   Darryn Kydd 08/10/2015, 4:46 PM

## 2015-08-10 NOTE — Progress Notes (Signed)
*  PRELIMINARY RESULTS* Echocardiogram 2D Echocardiogram has been performed.  Alexander [name] 08/10/2015, 10:23 AM

## 2015-08-10 NOTE — Progress Notes (Signed)
Initial Nutrition Assessment  DOCUMENTATION CODES:   Severe malnutrition in context of chronic illness  INTERVENTION:   Meals and Snacks: Cater to patient preferences; recommend liberalizing diet to Regular. Smaller more frequent meals with snacks ordered Medical Food Supplement Therapy: pt agreeable to trying Magic Cup supplement, will send BID  NUTRITION DIAGNOSIS:   Malnutrition related to chronic illness, poor appetite as evidenced by percent weight loss, per patient/family report.  GOAL:   Patient will meet greater than or equal to 90% of their needs  MONITOR:    (Energy Intake, Anthropometrics, Electrolyte/Renal Profile, Digestive System)  REASON FOR ASSESSMENT:   Malnutrition Screening Tool    ASSESSMENT:    Pt admitted with recurrent falls, likely due to parkinsonism  Past Medical History  Diagnosis Date  . COPD (chronic obstructive pulmonary disease) ([place])   . Hypertension   . Migraine   . Asthma   . BPH (benign prostatic hyperplasia)   . Deviated nasal septum   . DVT (deep venous thrombosis) ([place])   . Mantle cell lymphoma ([place])      Diet Order:  2g sodium  Energy Intake: pt ate some lunch today, 1/2 sandwich, did request some sherbet which Probation officer obtained for pt. No recorded po intake  Food and nutrition related history: pt reports he tries to eat at least 3 times per day, small meals. When asked, pt reports a meal might consist of yogurt or 1/2 sandwich. Pt reports he has tried Ensure type supplements and [employer] and does not like/tolerance. Pt reports appetite has been poor for >1 year  Digestive System: reports chronic nausea and vomiting, reports mildly nauseous at present, last vomited yesterday  Nutrition Focused Physical Exam: Nutrition-Focused physical exam completed. Findings are moderate fat depletion, moderate muscle depletion, and no edema.   Electrolyte and Renal Profile:  Recent Labs Lab 08/09/15 1921  08/10/15 0144  BUN 11 12  CREATININE 0.78 0.90  NA 136 132*  K 3.6 3.2*   Glucose Profile: No results for input(s): GLUCAP in the last 72 hours. Meds: NS at 75 ml/hr, midodrine  Height:   Ht Readings from Last 1 Encounters:  08/10/15 5\' 10"  (1.778 m)    Weight: pt reports he weighed 270 pounds 1.5 years ago; 33.3% wt loss. Pt reports the wt loss started just before his cancer diagnosis  Wt Readings from Last 1 Encounters:  08/10/15 180 lb (81.647 kg)   BMI:  Body mass index is 25.83 kg/(m^2).  Estimated Nutritional Needs:   Kcal:  [id number] kcals (BEE 1622, 1.3 AF, 1.1-1.3 IF)   Protein:  90-107 g(1.1-1.3 g/kg)   Fluid:  2050-2460 mL (25-30 ml/kg)   EDUCATION NEEDS:   [place], RD, LDN [phone] Pager  [phone] Weekend/On-Call Pager

## 2015-08-10 NOTE — Consult Note (Signed)
Reason for Consult: syncope Referring Physician:  Dr. Norman Herrlich is an 59 y.o. male.  HPI:  59 yo RHD M presents to [facility] due to multiple falls.  Pt reports that he has had several over the last year that are progressively worsening.  Pt remembers most of the falls except the one yesterday when he said he stood up and that's all he remembers.  Pt has rare loss of bladder and is never confused after these episodes.  Pt has never bitten his tongue either.  Pt does report that these episodes always occur when he is getting up or moving around.   Pt has not focal weakness/numbness or headache.  He does feel dizzy prior to most episodes.  Past Medical History  Diagnosis Date  . COPD (chronic obstructive pulmonary disease) ([place])   . Hypertension   . Migraine   . Asthma   . BPH (benign prostatic hyperplasia)   . Deviated nasal septum   . DVT (deep venous thrombosis) ([place])   . Mantle cell lymphoma [facility])     Past Surgical History  Procedure Laterality Date  . Throat surgery    . Barryx N/A   . Esophagogastroduodenoscopy (egd) with propofol N/A 02/23/2015    Procedure: ESOPHAGOGASTRODUODENOSCOPY (EGD) WITH PROPOFOL;  Surgeon: Hulen Luster, MD;  Location: [facility] ENDOSCOPY;  Service: Gastroenterology;  Laterality: N/A;  . Esophagogastroduodenoscopy (egd) with propofol N/A 05/04/2015    Procedure: ESOPHAGOGASTRODUODENOSCOPY (EGD) WITH PROPOFOL;  Surgeon: Hulen Luster, MD;  Location: [facility] ENDOSCOPY;  Service: Gastroenterology;  Laterality: N/A;  . Tonsillectomy    . Esophagogastroduodenoscopy (egd) with propofol N/A 07/27/2015    Procedure: ESOPHAGOGASTRODUODENOSCOPY (EGD) WITH PROPOFOL;  Surgeon: Hulen Luster, MD;  [facility] END[facility]COPY;  Service: Gastroenterology;  Laterality: N/A;    Family History  Problem Relation Age of Onset  . Mesothelioma Father   . Breast cancer Mother     Social History:  reports that he has quit smoking. He does not have any smokeless tobacco history on file. He  reports that he does not drink alcohol or use illicit drugs.  Allergies:  Allergies  Allergen Reactions  . Ativan [Lorazepam] Other (See Comments)    Reaction:  Dizziness   . Bee Venom     Medications: personally reviewed by me   Results for orders placed or performed during the hospital encounter of 08/10/15 (from the past 48 hour(s))  Basic metabolic panel     Status: Abnormal   Collection Time: 08/10/15  1:44 AM  Result Value Ref Range   Sodium 132 (L) 135 - 145 mmol/L   Potassium 3.2 (L) 3.5 - 5.1 mmol/L   Chloride 100 (L) 101 - 111 mmol/L   CO2 27 22 - 32 mmol/L   Glucose, Bld 106 (H) 65 - 99 mg/dL   BUN 12 6 - 20 mg/dL   Creatinine, Ser 0.90 0.61 - 1.24 mg/dL   Calcium 8.4 (L) 8.9 - 10.3 mg/dL   GFR calc non Af Amer >60 >60 mL/min   GFR calc Af Amer >60 >60 mL/min    Comment: (NOTE) The eGFR has been calculated using the CKD EPI equation. This calculation has not been validated in all clinical situations. eGFR's persistently <60 mL/min signify possible Chronic Kidney Disease.    Anion gap 5 5 - 15  CBC     Status: Abnormal   Collection Time: 08/10/15  1:44 AM  Result Value Ref Range   WBC 6.3 3.8 - 10.6 K/uL  RBC 4.56 4.40 - 5.90 MIL/uL   Hemoglobin 13.9 13.0 - 18.0 g/dL   HCT 40.1 40.0 - 52.0 %   MCV 87.9 80.0 - 100.0 fL   MCH 30.4 26.0 - 34.0 pg   MCHC 34.6 32.0 - 36.0 g/dL   RDW 13.1 11.5 - 14.5 %   Platelets 142 (L) 150 - 440 K/uL  Troponin I     Status: None   Collection Time: 08/10/15  1:44 AM  Result Value Ref Range   Troponin I <0.03 <0.031 ng/mL    Comment:        NO INDICATION OF MYOCARDIAL INJURY.   Troponin I     Status: None   Collection Time: 08/10/15 11:06 AM  Result Value Ref Range   Troponin I <0.03 <0.031 ng/mL    Comment:        NO INDICATION OF MYOCARDIAL INJURY.     Dg Chest 2 View  08/10/2015  CLINICAL DATA:  Fall with right rib pain EXAM: CHEST  2 VIEW COMPARISON:  08/09/2015 FINDINGS: Normal heart size and mediastinal  contours. Calcified granuloma in the right upper lobe. No acute infiltrate or edema. No effusion or pneumothorax. No acute osseous findings. IMPRESSION: No acute finding. Electronically Signed   By: Monte Fantasia M.D.   On: 08/10/2015 02:33   Dg Chest 2 View  08/09/2015  CLINICAL DATA:  Generalized weakness with multiple falls. EXAM: CHEST  2 VIEW COMPARISON:  05/19/2015 FINDINGS: The heart size and mediastinal contours are within normal limits. Both lungs are clear. The visualized skeletal structures are unremarkable. IMPRESSION: No active cardiopulmonary disease. Electronically Signed   By: Kathreen Devoid   On: 08/09/2015 19:56   Ct Head Wo Contrast  08/09/2015  CLINICAL DATA:  Generalized weakness, multiple falls EXAM: CT HEAD WITHOUT CONTRAST TECHNIQUE: Contiguous axial images were obtained from the base of the skull through the vertex without contrast. COMPARISON:  08/29/2014 FINDINGS: Normal appearance of the intracranial structures. No evidence for acute hemorrhage, mass lesion, midline shift, hydrocephalus or large infarct. No acute bony abnormality. The visualized sinuses demonstrate chronic mucosal thickening diffusely, improved compared 08/29/2014. Mastoids are clear. No acute osseous finding or skull abnormality. Orbits are symmetric. IMPRESSION: No acute intracranial abnormality.  Stable exam. Improving chronic sinus disease. Electronically Signed   By: Jerilynn Mages.  Shick M.D.   On: 08/09/2015 19:50   US Carotid Bilateral  08/10/2015  CLINICAL DATA:  Syncopal episodes for 2 weeks with visual disturbance EXAM: BILATERAL CAROTID DUPLEX ULTRASOUND TECHNIQUE: Pearline Cables scale imaging, color Doppler and duplex ultrasound were performed of bilateral carotid and vertebral arteries in the neck. COMPARISON:  None. FINDINGS: Criteria: Quantification of carotid stenosis is based on velocity parameters that correlate the residual internal carotid diameter with NASCET-based stenosis levels, using the diameter of the  distal internal carotid lumen as the denominator for stenosis measurement. The following velocity measurements were obtained: RIGHT ICA:  65/19 cm/sec CCA:  629/52 cm/sec SYSTOLIC ICA/CCA RATIO:  0.6 DIASTOLIC ICA/CCA RATIO:  0.4 ECA:  88 cm/sec LEFT ICA:  75/14 cm/sec CCA:  841/32 cm/sec SYSTOLIC ICA/CCA RATIO:  0.7 DIASTOLIC ICA/CCA RATIO:  0.7 ECA:  100 cm/sec RIGHT CAROTID ARTERY: Minor atherosclerotic plaque formation. No hemodynamically significant right ICA stenosis, velocity elevation, or turbulent flow. Degree of narrowing less than 50%. RIGHT VERTEBRAL ARTERY:  Antegrade LEFT CAROTID ARTERY: Similar scattered minor atherosclerotic plaque formation. No hemodynamically significant left ICA stenosis, velocity elevation, or turbulent flow. LEFT VERTEBRAL ARTERY:  Antegrade IMPRESSION: Minor carotid atherosclerosis.  No hemodynamically significant ICA stenosis. Degree of narrowing less than 50% bilaterally. Patent antegrade vertebral flow bilaterally. Electronically Signed   By: Jerilynn Mages.  Shick M.D.   On: 08/10/2015 11:35    Review of Systems  Constitutional: Negative.   HENT: Negative.   Eyes: Negative.   Respiratory: Negative.   Cardiovascular: Negative.   Gastrointestinal: Negative.   Genitourinary: Negative.   Musculoskeletal: Negative.   Skin: Negative.   Neurological: Positive for dizziness and loss of consciousness. Negative for tingling, tremors, sensory change, speech change, focal weakness and seizures.  Psychiatric/Behavioral: Negative.    Blood pressure 114/78, pulse 78, temperature 98.3 F (36.8 C), temperature source Oral, resp. rate 18, height '5\' 10"'$  (1.778 m), weight 81.647 kg (180 lb), SpO2 100 %. Physical Exam  Nursing note and vitals reviewed. Constitutional: He appears well-developed and well-nourished. No distress.  HENT:  Head: Normocephalic and atraumatic.  Right Ear: External ear normal.  Left Ear: External ear normal.  Nose: Nose normal.  Mouth/Throat: Oropharynx is  clear and moist.  Eyes: Conjunctivae and EOM are normal. Pupils are equal, round, and reactive to light. No scleral icterus.  Neck: Normal range of motion. Neck supple.  Cardiovascular: Normal rate, regular rhythm, normal heart sounds and intact distal pulses.   No murmur heard. Respiratory: Effort normal and breath sounds normal. No respiratory distress.  GI: Soft. Bowel sounds are normal. He exhibits no distension.  Musculoskeletal: Normal range of motion. He exhibits no edema.  Neurological:  A+Ox3, nl speech and language PERRLA, EOMI with slowed saccades, nl VF, face symmetric with hypomimia, tongue midline 5/5 B, mild resting tremor on R, nl tone, bradykinesia R>L FTN and HTS WNL 1+/4 B, mute plantars Intact temp and pin B  Skin: Skin is dry. Rash noted. He is not diaphoretic.  Psychiatric: He has a normal mood and affect.   CT of head personally reviewed by me and shows mild atrophy  Assessment/Plan: 1.  Syncope-  This is likely related to underlying parkinsons and appears to be orthostatic in nature 2.  Parkinsonism-  This appears to be idiopathic but could be Parkinson plus syndrome as well due to 1.;  This could be why pt has so many falls as well -  Start Sinemet 25/'100mg'$  TID -  Start midodrine 2.'5mg'$  BID -  May benefit from tilt table testing if the orthostasis continues -  Needs TED hose to thighs -  PT evaluation -  Will sign off, please call with questions -  Needs to f/u with [facility] in 4-6 weeks  Joseandres Mazer 08/10/2015, 12:54 PM

## 2015-08-10 NOTE — ED Notes (Signed)
Pt. States when he got home from ED early tonight, pt. States he got up from bed to use bathroom and pt. Got dizzy and fell.  Pt. States he might have fallen on cloths hamper.  Pt. States pain to rt. Ribs.  No deformity or swelling to area noted.  Pt. Has free movement of upper extremities.

## 2015-08-10 NOTE — H&P (Signed)
[facility] [place] NAME: Alexander [name]    MR#:  [id number]  DATE OF BIRTH:  1956-02-11  DATE OF ADMISSION:  08/10/2015  PRIMARY CARE PHYSICIAN: Perrin Maltese, MD   REQUESTING/REFERRING PHYSICIAN:   CHIEF COMPLAINT:   Chief Complaint  Patient presents with  . Loss of Consciousness    HISTORY OF PRESENT ILLNESS: Alexander [name]  is a 59 y.o. male with a known history of COPD, hypertension, bronchial asthma, DVT on Xarelto, Mantle cell lymphoma presented to the emergency room secondary to fall and loss of consciousness. Patient initially came to the emergency room at 7 PM yesterday evening for syncope. Patient was evaluated with a CT head and sent home. Patient again lost balance, felt dizzy and fell down and passed out at home. Patient came back to the emergency room past midnight again. Upon further questioning patient says he never had any history of syncope in the past. Patient's family member says patient also had a generalized seizure early this morning when he passed out. Patient has history of seizures in the past but currently not on any medication. Has pain in the rib cage secondary to fall. No history of any head injury. No shortness of breath. No abdominal pain or nausea or vomiting. CT head was normal and troponin was negative. Patient was given 1 g of IV Keppra in the emergency room.  PAST MEDICAL HISTORY:   Past Medical History  Diagnosis Date  . COPD (chronic obstructive pulmonary disease) ([place])   . Hypertension   . Migraine   . Asthma   . BPH (benign prostatic hyperplasia)   . Deviated nasal septum   . DVT (deep venous thrombosis) ([place])   . Mantle cell lymphoma ([place])     PAST SURGICAL HISTORY:  Past Surgical History  Procedure Laterality Date  . Throat surgery    . Barryx N/A   . Esophagogastroduodenoscopy (egd) with propofol N/A 02/23/2015    Procedure: ESOPHAGOGASTRODUODENOSCOPY (EGD) WITH PROPOFOL;  Surgeon: Hulen Luster, MD;  Location: [facility] ENDOSCOPY;  Service: Gastroenterology;  Laterality: N/A;  . Esophagogastroduodenoscopy (egd) with propofol N/A 05/04/2015    Procedure: ESOPHAGOGASTRODUODENOSCOPY (EGD) WITH PROPOFOL;  Surgeon: Hulen Luster, MD;  Location: [facility] ENDOSCOPY;  Service: Gastroenterology;  Laterality: N/A;  . Tonsillectomy    . Esophagogastroduodenoscopy (egd) with propofol N/A 07/27/2015    Procedure: ESOPHAGOGASTRODUODENOSCOPY (EGD) WITH PROPOFOL;  Surgeon: Hulen Luster, MD;  Location: [facility] ENDOSCOPY;  Service: Gastroenterology;  Laterality: N/A;    SOCIAL HISTORY:  Social History  Substance Use Topics  . Smoking status: Former Research scientist (life sciences)  . Smokeless tobacco: Not on file  . Alcohol Use: No    FAMILY HISTORY:  Family History  Problem Relation Age of Onset  . Mesothelioma Father   . Breast cancer Mother     DRUG ALLERGIES:  Allergies  Allergen Reactions  . Ativan [Lorazepam] Other (See Comments)    Reaction:  Dizziness   . Bee Venom     REVIEW OF SYSTEMS:   CONSTITUTIONAL: No fever,Has fatigue and weakness.  EYES: No blurred or double vision.  EARS, NOSE, AND THROAT: No tinnitus or ear pain.  RESPIRATORY: No cough, shortness of breath, wheezing or hemoptysis.  CARDIOVASCULAR: has rib cage pain, no orthopnea, no edema.  GASTROINTESTINAL: No nausea, vomiting, diarrhea or abdominal pain.  GENITOURINARY: No dysuria, hematuria.  ENDOCRINE: No polyuria, nocturia,  HEMATOLOGY: No anemia, easy bruising or bleeding SKIN: No rash or lesion. MUSCULOSKELETAL: No  joint pain or arthritis.   NEUROLOGIC: No tingling, numbness, weakness.  PSYCHIATRY: No anxiety or depression.   MEDICATIONS AT HOME:  Prior to Admission medications   Medication Sig Start Date End Date Taking? Authorizing Provider  azithromycin (ZITHROMAX) 250 MG tablet Take 1 tablet (250 mg total) by mouth daily. Patient not taking: Reported on 07/27/2015 07/19/15   Vaughan Basta, MD  cefUROXime (CEFTIN) 250 MG tablet  Take 1 tablet (250 mg total) by mouth 2 (two) times daily with a meal. Patient not taking: Reported on 07/27/2015 07/19/15   Vaughan Basta, MD  dicyclomine (BENTYL) 20 MG tablet Take 20 mg by mouth every 6 (six) hours as needed for spasms.     Historical Provider, MD  docusate sodium (COLACE) 100 MG capsule Take 200 mg by mouth 2 (two) times daily as needed for mild constipation.    Historical Provider, MD  feeding supplement (BOOST / RESOURCE BREEZE) LIQD Take 1 Container by mouth 3 (three) times daily between meals. Patient not taking: Reported on 07/27/2015 07/18/15   Bettey Costa, MD  fluticasone (FLONASE) 50 MCG/ACT nasal spray Place 2 sprays into both nostrils daily as needed for rhinitis.     Historical Provider, MD  loperamide (IMODIUM) 2 MG capsule Take 2 mg by mouth as needed for diarrhea or loose stools.    Historical Provider, MD  metoprolol succinate (TOPROL XL) 50 MG 24 hr tablet Take 50 mg by mouth daily.    Historical Provider, MD  morphine (MSIR) 30 MG tablet Take 30 mg by mouth 3 (three) times daily.    Historical Provider, MD  OLANZapine (ZYPREXA) 5 MG tablet Take 2.5 mg by mouth every 6 (six) hours as needed (for GI upset).     Historical Provider, MD  ondansetron (ZOFRAN ODT) 4 MG disintegrating tablet Take 1 tablet (4 mg total) by mouth every 8 (eight) hours as needed for nausea or vomiting. Patient not taking: Reported on 07/27/2015 07/04/15   Harvest Dark, MD  oxyCODONE (OXY IR/ROXICODONE) 5 MG immediate release tablet Take 2 tablets (10 mg total) by mouth every 4 (four) hours as needed for severe pain. 02/26/15   Hulen Luster, MD  oxyCODONE 10 MG TABS Take 1 tablet (10 mg total) by mouth every 4 (four) hours as needed for severe pain. 05/19/15   Bettey Costa, MD  promethazine (PHENERGAN) 25 MG suppository Place 1 suppository (25 mg total) rectally every 6 (six) hours as needed for nausea or vomiting. 04/25/15 04/24/16  Loney Hering, MD  rivaroxaban (XARELTO) 20 MG TABS  tablet Take 1 tablet (20 mg total) by mouth daily with supper. 07/18/15   Bettey Costa, MD  senna (SENOKOT) 8.6 MG TABS tablet Take 2 tablets by mouth 2 (two) times daily as needed for mild constipation.    Historical Provider, MD  SUMAtriptan (IMITREX) 50 MG tablet Take 50 mg by mouth every 2 (two) hours as needed for migraine.     Historical Provider, MD  traZODone (DESYREL) 50 MG tablet Take 50 mg by mouth at bedtime.    Historical Provider, MD  urea (CARMOL) 20 % cream Apply 1 application topically as needed (for dry skin on feet).     Historical Provider, MD      PHYSICAL EXAMINATION:   VITAL SIGNS: Blood pressure 104/64, pulse 75, temperature 98 F (36.7 C), temperature source Oral, resp. rate 18, height 5\' 10"  (1.778 m), weight 81.647 kg (180 lb), SpO2 100 %.  GENERAL:  59 y.o.-year-old patient lying in  the bed with no acute distress.  EYES: Pupils equal, round, reactive to light and accommodation. No scleral icterus. Extraocular muscles intact.  HEENT: Head atraumatic, normocephalic. Oropharynx and nasopharynx clear.  NECK:  Supple, no jugular venous distention. No thyroid enlargement, no tenderness.  LUNGS: Normal breath sounds bilaterally, no wheezing, rales,rhonchi or crepitation. No use of accessory muscles of respiration.  CARDIOVASCULAR: S1, S2 normal. No murmurs, rubs, or gallops.  ABDOMEN: Soft, nontender, nondistended. Bowel sounds present. No organomegaly or mass.  EXTREMITIES: No pedal edema, cyanosis, or clubbing.  NEUROLOGIC: Cranial nerves II through XII are intact. Muscle strength 5/5 in all extremities. Sensation intact. Gait not checked.  PSYCHIATRIC: The patient is alert and oriented x 3.  SKIN: No obvious rash, lesion, or ulcer.   LABORATORY PANEL:   CBC  Recent Labs Lab 08/09/15 1921 08/10/15 0144  WBC 6.2 6.3  HGB 14.1 13.9  HCT 42.2 40.1  PLT 140* 142*  MCV 88.1 87.9  MCH 29.5 30.4  MCHC 33.5 34.6  RDW 13.5 13.1  LYMPHSABS 1.4  --   MONOABS 0.5   --   EOSABS 0.1  --   BASOSABS 0.0  --    ------------------------------------------------------------------------------------------------------------------  Chemistries   Recent Labs Lab 08/09/15 1921 08/10/15 0144  NA 136 132*  K 3.6 3.2*  CL 103 100*  CO2 29 27  GLUCOSE 105* 106*  BUN 11 12  CREATININE 0.78 0.90  CALCIUM 8.8* 8.4*  AST 15  --   ALT 11*  --   ALKPHOS 86  --   BILITOT 0.9  --    ------------------------------------------------------------------------------------------------------------------ estimated creatinine clearance is 89 mL/min (by C-G formula based on Cr of 0.9). ------------------------------------------------------------------------------------------------------------------ No results for input(s): TSH, T4TOTAL, T3FREE, THYROIDAB in the last 72 hours.  Invalid input(s): FREET3   Coagulation profile No results for input(s): INR, PROTIME in the last 168 hours. ------------------------------------------------------------------------------------------------------------------- No results for input(s): DDIMER in the last 72 hours. -------------------------------------------------------------------------------------------------------------------  Cardiac Enzymes  Recent Labs Lab 08/09/15 1921 08/10/15 0144  TROPONINI <0.03 <0.03   ------------------------------------------------------------------------------------------------------------------ Invalid input(s): POCBNP  ---------------------------------------------------------------------------------------------------------------  Urinalysis    Component Value Date/Time   COLORURINE YELLOW* 08/09/2015 2122   COLORURINE Yellow 11/27/2014 1918   APPEARANCEUR CLEAR* 08/09/2015 2122   APPEARANCEUR Clear 11/27/2014 1918   LABSPEC 1.010 08/09/2015 2122   LABSPEC 1.017 11/27/2014 1918   PHURINE 7.0 08/09/2015 2122   PHURINE 7.0 11/27/2014 1918   GLUCOSEU NEGATIVE 08/09/2015 2122   GLUCOSEU  Negative 11/27/2014 1918   HGBUR NEGATIVE 08/09/2015 2122   HGBUR Negative 11/27/2014 1918   BILIRUBINUR NEGATIVE 08/09/2015 2122   BILIRUBINUR Negative 11/27/2014 1918   KETONESUR NEGATIVE 08/09/2015 2122   KETONESUR Negative 11/27/2014 1918   PROTEINUR NEGATIVE 08/09/2015 2122   PROTEINUR Negative 11/27/2014 1918   NITRITE NEGATIVE 08/09/2015 2122   NITRITE Negative 11/27/2014 1918   LEUKOCYTESUR NEGATIVE 08/09/2015 2122   LEUKOCYTESUR Negative 11/27/2014 1918     RADIOLOGY: Dg Chest 2 View  08/10/2015  CLINICAL DATA:  Fall with right rib pain EXAM: CHEST  2 VIEW COMPARISON:  08/09/2015 FINDINGS: Normal heart size and mediastinal contours. Calcified granuloma in the right upper lobe. No acute infiltrate or edema. No effusion or pneumothorax. No acute osseous findings. IMPRESSION: No acute finding. Electronically Signed   By: Monte Fantasia M.D.   On: 08/10/2015 02:33   Dg Chest 2 View  08/09/2015  CLINICAL DATA:  Generalized weakness with multiple falls. EXAM: CHEST  2 VIEW COMPARISON:  05/19/2015 FINDINGS: The heart size  and mediastinal contours are within normal limits. Both lungs are clear. The visualized skeletal structures are unremarkable. IMPRESSION: No active cardiopulmonary disease. Electronically Signed   By: Kathreen Devoid   On: 08/09/2015 19:56   Ct Head Wo Contrast  08/09/2015  CLINICAL DATA:  Generalized weakness, multiple falls EXAM: CT HEAD WITHOUT CONTRAST TECHNIQUE: Contiguous axial images were obtained from the base of the skull through the vertex without contrast. COMPARISON:  08/29/2014 FINDINGS: Normal appearance of the intracranial structures. No evidence for acute hemorrhage, mass lesion, midline shift, hydrocephalus or large infarct. No acute bony abnormality. The visualized sinuses demonstrate chronic mucosal thickening diffusely, improved compared 08/29/2014. Mastoids are clear. No acute osseous finding or skull abnormality. Orbits are symmetric. IMPRESSION: No  acute intracranial abnormality.  Stable exam. Improving chronic sinus disease. Electronically Signed   By: Jerilynn Mages.  Shick M.D.   On: 08/09/2015 19:50    EKG: Orders placed or performed during the hospital encounter of 08/10/15  . ED EKG  . ED EKG    IMPRESSION AND PLAN: 59 year old male patient with history of COPD, hypertension, mantle cell lymphoma, prostate hypertrophy presented to the emergency room with syncope and fall. Patient and family also says he had an episode of seizure. Admitting diagnosis #1 fall #2 syncope and collapse #3 seizure #4 hypokalemia #5 COPD #6 mantle cell lymphoma #7 history of DVT Treatment plan Admit patient to telemetry under observation bed Patient given 1 g of IV Keppra EEG to evaluate for seizure disorder Neurology consult for syncope and seizure Carotid ultrasound to rule out obstruction Echocardiogram Resume Xarelto for anticoagulation Replace potassium  All the records are reviewed and case discussed with ED provider. Management plans discussed with the patient, family and they are in agreement.  CODE STATUS:FULL    TOTAL TIME TAKING CARE OF THIS PATIENT: 50 minutes.    Saundra Shelling M.D on 08/10/2015 at 5:24 AM  Between 7am to 6pm - Pager - [phone]  After 6pm go to [email or web address] - password [email or web address] [facility]  Office  [phone]  CC: Primary care physician; Perrin Maltese, MD

## 2015-08-10 NOTE — ED Notes (Signed)
Pt to triage via w/c with no distress noted; st seen earlier today for fall; st PTA got OOB,became dizzy with syncopal episode; c/o pain right side ribcage

## 2015-08-11 ENCOUNTER — Observation Stay: Payer: Medicaid Other

## 2015-08-11 ENCOUNTER — Encounter: Payer: Self-pay | Admitting: Radiology

## 2015-08-11 LAB — CBC
HCT: 37.1 % — ABNORMAL LOW (ref 40.0–52.0)
Hemoglobin: 12.8 g/dL — ABNORMAL LOW (ref 13.0–18.0)
MCH: 30.4 pg (ref 26.0–34.0)
MCHC: 34.4 g/dL (ref 32.0–36.0)
MCV: 88.3 fL (ref 80.0–100.0)
PLATELETS: 123 10*3/uL — AB (ref 150–440)
RBC: 4.2 MIL/uL — AB (ref 4.40–5.90)
RDW: 13.3 % (ref 11.5–14.5)
WBC: 4.6 10*3/uL (ref 3.8–10.6)

## 2015-08-11 LAB — BASIC METABOLIC PANEL
Anion gap: 2 — ABNORMAL LOW (ref 5–15)
BUN: 13 mg/dL (ref 6–20)
CALCIUM: 8.4 mg/dL — AB (ref 8.9–10.3)
CHLORIDE: 105 mmol/L (ref 101–111)
CO2: 32 mmol/L (ref 22–32)
CREATININE: 0.84 mg/dL (ref 0.61–1.24)
GFR calc Af Amer: >60 mL/min (ref >60)
GFR calc non Af Amer: >60 mL/min (ref >60)
GLUCOSE: 103 mg/dL — AB (ref 65–99)
Potassium: 3.7 mmol/L (ref 3.5–5.1)
Sodium: 139 mmol/L (ref 135–145)

## 2015-08-11 LAB — MAGNESIUM: MAGNESIUM: 2.1 mg/dL (ref 1.7–2.4)

## 2015-08-11 MED ORDER — ONDANSETRON HCL 4 MG/2ML IJ SOLN
4.0000 mg | INTRAMUSCULAR | Status: AC
  Administered 2015-08-11: 4 mg via INTRAVENOUS

## 2015-08-11 MED ORDER — TAMSULOSIN HCL 0.4 MG PO CAPS
0.4000 mg | ORAL_CAPSULE | Freq: Every day | ORAL | Status: DC
  Administered 2015-08-12: 0.4 mg via ORAL
  Filled 2015-08-11: qty 1

## 2015-08-11 MED ORDER — IOHEXOL 350 MG/ML SOLN
100.0000 mL | Freq: Once | INTRAVENOUS | Status: AC | PRN
  Administered 2015-08-11: 100 mL via INTRAVENOUS

## 2015-08-11 MED ORDER — PROMETHAZINE HCL 25 MG/ML IJ SOLN
25.0000 mg | Freq: Four times a day (QID) | INTRAMUSCULAR | Status: DC | PRN
  Administered 2015-08-11 (×2): 25 mg via INTRAVENOUS
  Filled 2015-08-11 (×2): qty 1

## 2015-08-11 MED ORDER — METOPROLOL SUCCINATE ER 25 MG PO TB24
25.0000 mg | ORAL_TABLET | Freq: Every day | ORAL | Status: DC
  Administered 2015-08-12: 25 mg via ORAL
  Filled 2015-08-11: qty 1

## 2015-08-11 MED ORDER — MECLIZINE HCL 25 MG PO TABS
25.0000 mg | ORAL_TABLET | ORAL | Status: AC
  Administered 2015-08-11: 25 mg via ORAL
  Filled 2015-08-11: qty 1

## 2015-08-11 NOTE — Progress Notes (Signed)
Patient presently resting in the bed, remains alert and oriented, denies pain at this time, PRN med med administer as per order, PT consult completed, discharge postponed as per MD note, VS stable, fall precaution maintained due to dizziness and weakness,> will continue to monitor

## 2015-08-11 NOTE — Progress Notes (Signed)
[facility] at [place] NAME: Alexander [name]    MR#:  [id number]  DATE OF BIRTH:  02/16/1956  SUBJECTIVE:  CHIEF COMPLAINT:   Chief Complaint  Patient presents with  . Loss of Consciousness   -very dizzy and nauseous today, worked with PT but dizzy and per them not safe to go home - no chest pain or other complaints. generalised pains which are chronic noted. No seizures here  REVIEW OF SYSTEMS:  Review of Systems  Constitutional: Negative for fever, chills and weight loss.  HENT: Negative for ear discharge, ear pain and nosebleeds.   Eyes: Negative for blurred vision.  Respiratory: Negative for cough, shortness of breath and wheezing.   Cardiovascular: Negative for chest pain, palpitations and leg swelling.  Gastrointestinal: Negative for nausea, vomiting, abdominal pain, diarrhea and constipation.  Genitourinary: Negative for dysuria.  Musculoskeletal: Positive for myalgias, joint pain and falls.       Right rib cage pain  Neurological: Positive for dizziness, seizures and weakness. Negative for tingling, tremors, speech change, focal weakness and headaches.  Psychiatric/Behavioral: Negative for depression.    DRUG ALLERGIES:   Allergies  Allergen Reactions  . Ativan [Lorazepam] Other (See Comments)    Reaction:  Dizziness   . Bee Venom     VITALS:  Blood pressure 105/61, pulse 66, temperature 97.8 F (36.6 C), temperature source Oral, resp. rate 18, height 5\' 10"  (1.778 m), weight 81.647 kg (180 lb), SpO2 98 %.  PHYSICAL EXAMINATION:  Physical Exam  GENERAL:  59 y.o.-year-old patient lying in the bed with no acute distress.  EYES: Pupils equal, round, reactive to light and accommodation. No scleral icterus. Extraocular muscles intact.  HEENT: Head atraumatic, normocephalic. Oropharynx and nasopharynx clear.  NECK:  Supple, no jugular venous distention. No thyroid enlargement, no tenderness.  LUNGS: Normal breath  sounds bilaterally, no wheezing, rales,rhonchi or crepitation. No use of accessory muscles of respiration.  CARDIOVASCULAR: S1, S2 normal. No murmurs, rubs, or gallops.  ABDOMEN: Soft, nontender, nondistended. Bowel sounds present. No organomegaly or mass.  EXTREMITIES: No pedal edema, cyanosis, or clubbing.  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 Muscle strength 5/5 in all extremities. Sensation intact. Gait not checked. No nystagmus PSYCHIATRIC: The patient is alert and oriented x 3.  SKIN: No obvious rash, lesion, or ulcer.    LABORATORY PANEL:   CBC  Recent Labs Lab 08/11/15 0537  WBC 4.6  HGB 12.8*  HCT 37.1*  PLT 123*   ------------------------------------------------------------------------------------------------------------------  Chemistries   Recent Labs Lab 08/09/15 1921  08/11/15 0537  NA 136  < > 139  K 3.6  < > 3.7  CL 103  < > 105  CO2 29  < > 32  GLUCOSE 105*  < > 103*  BUN 11  < > 13  CREATININE 0.78  < > 0.84  CALCIUM 8.8*  < > 8.4*  MG  --   --  2.1  AST 15  --   --   ALT 11*  --   --   ALKPHOS 86  --   --   BILITOT 0.9  --   --   < > = values in this interval not displayed. ------------------------------------------------------------------------------------------------------------------  Cardiac Enzymes  Recent Labs Lab 08/10/15 2252  TROPONINI <0.03   ------------------------------------------------------------------------------------------------------------------  RADIOLOGY:  Dg Chest 2 View  08/10/2015  CLINICAL  DATA:  Fall with right rib pain EXAM: CHEST  2 VIEW COMPARISON:  08/09/2015 FINDINGS: Normal heart size and mediastinal contours. Calcified granuloma in the right upper lobe. No acute infiltrate or edema. No effusion or pneumothorax. No acute osseous findings. IMPRESSION: No acute finding. Electronically  Signed   By: Monte Fantasia M.D.   On: 08/10/2015 02:33   Dg Chest 2 View  08/09/2015  CLINICAL DATA:  Generalized weakness with multiple falls. EXAM: CHEST  2 VIEW COMPARISON:  05/19/2015 FINDINGS: The heart size and mediastinal contours are within normal limits. Both lungs are clear. The visualized skeletal structures are unremarkable. IMPRESSION: No active cardiopulmonary disease. Electronically Signed   By: Kathreen Devoid   On: 08/09/2015 19:56   Ct Head Wo Contrast  08/09/2015  CLINICAL DATA:  Generalized weakness, multiple falls EXAM: CT HEAD WITHOUT CONTRAST TECHNIQUE: Contiguous axial images were obtained from the base of the skull through the vertex without contrast. COMPARISON:  08/29/2014 FINDINGS: Normal appearance of the intracranial structures. No evidence for acute hemorrhage, mass lesion, midline shift, hydrocephalus or large infarct. No acute bony abnormality. The visualized sinuses demonstrate chronic mucosal thickening diffusely, improved compared 08/29/2014. Mastoids are clear. No acute osseous finding or skull abnormality. Orbits are symmetric. IMPRESSION: No acute intracranial abnormality.  Stable exam. Improving chronic sinus disease. Electronically Signed   By: Jerilynn Mages.  Shick M.D.   On: 08/09/2015 19:50   Mr Jeri Cos [id number] Contrast  08/10/2015  CLINICAL DATA:  Syncopal episode with dizziness.  Possible seizure. EXAM: MRI HEAD WITHOUT AND WITH CONTRAST TECHNIQUE: Multiplanar, multiecho pulse sequences of the brain and surrounding structures were obtained without and with intravenous contrast. CONTRAST:  29mL MULTIHANCE GADOBENATE DIMEGLUMINE 529 MG/ML IV SOLN COMPARISON:  Head CT 08/09/2015 FINDINGS: Diffusion imaging does not show any acute or subacute infarction. The brainstem is normal. No cerebellar abnormality. Within the cerebral hemispheres, there are scattered foci of T2 and FLAIR signal, primarily in the subcortical white matter but with some deep white matter involvement. The  pattern is nonspecific and could be due to small vessel disease, migraine related foci, previous head trauma, or conceivably demyelinating disease. No cortical or large vessel territory infarction. No mass lesion, hemorrhage, hydrocephalus or extra-axial collection. No mesial temporal lesion. After contrast administration, no abnormal enhancement occurs. IMPRESSION: No acute or subacute abnormality. Scattered foci of T2 and FLAIR signal within the cerebral hemispheric white matter that are nonspecific. The differential diagnosis includes small-vessel disease, migraine related foci, previous head trauma or vasculitis, or demyelinating disease. Electronically Signed   By: Nelson Chimes M.D.   On: 08/10/2015 17:05   US Carotid Bilateral  08/10/2015  CLINICAL DATA:  Syncopal episodes for 2 weeks with visual disturbance EXAM: BILATERAL CAROTID DUPLEX ULTRASOUND TECHNIQUE: Pearline Cables scale imaging, color Doppler and duplex ultrasound were performed of bilateral carotid and vertebral arteries in the neck. COMPARISON:  None. FINDINGS: Criteria: Quantification of carotid stenosis is based on velocity parameters that correlate the residual internal carotid diameter with NASCET-based stenosis levels, using the diameter of the distal internal carotid lumen as the denominator for stenosis measurement. The following velocity measurements were obtained: RIGHT ICA:  65/19 cm/sec CCA:  [id number] cm/sec SYSTOLIC ICA/CCA RATIO:  0.6 DIASTOLIC ICA/CCA RATIO:  0.4 ECA:  88 cm/sec LEFT ICA:  75/14 cm/sec CCA:  [id number] cm/sec SYSTOLIC ICA/CCA RATIO:  0.7 DIASTOLIC ICA/CCA RATIO:  0.7 ECA:  100 cm/sec RIGHT CAROTID ARTERY: Minor atherosclerotic plaque formation. No hemodynamically significant right ICA stenosis, velocity elevation, or turbulent flow.  Degree of narrowing less than 50%. RIGHT VERTEBRAL ARTERY:  Antegrade LEFT CAROTID ARTERY: Similar scattered minor atherosclerotic plaque formation. No hemodynamically significant left ICA stenosis,  velocity elevation, or turbulent flow. LEFT VERTEBRAL ARTERY:  Antegrade IMPRESSION: Minor carotid atherosclerosis. No hemodynamically significant ICA stenosis. Degree of narrowing less than 50% bilaterally. Patent antegrade vertebral flow bilaterally. Electronically Signed   By: Jerilynn Mages.  Shick M.D.   On: 08/10/2015 11:35    EKG:   Orders placed or performed during the hospital encounter of 08/10/15  . ED EKG  . ED EKG    ASSESSMENT AND PLAN:   59 year old male with past medical history significant for hypertension, COPD, DVT on Xarelto, mantle cell lymphoma presents to the hospital secondary to recurrent falls.  #1 recurrent falls with syncope-questionable seizure activity noticed by family on admission. -No traumatic brain injury history. No prior seizure history. -Usually confused after these episodes, feels the aura of these episodes coming on. No tonic-clonic activity reported. -He did receive a loading dose of Keppra in the emergency room. -Does feel dizzy prior to most of these episodes. Orthostatic vital signs ordered. - Continues to have dizziness in the hospital. Consistently loosing balance to left on PT eval- they think central causes for vertigo - Will have neuro re-eval this patient -MRI of the brain with and without contrast negative for acute findings -EEG ordered. - discontinue sinemet for now as symptoms worsened after starting sinemet -Gentle hydration, physical therapy. -continue midodrine for now  #2 mantle cell lymphoma-follows up with oncology. Has chronic bone pain. -Continue his MS Contin 3 times a day and also on oxycodone as needed. -Has Dilaudid for severe pain from his falls.  #3 COPD-appears stable. Continue home inhalers.  #4 history of DVT-on Xarelto.  #5 hypertension-continue Toprol. Decrease the dose with his hypotension  #7 DVT prophylaxis-patient already on Xarelto   All the records ar reviewed and case discussed with Care Management/Social  Workerr. Management plans discussed with the patient, family and they are in agreement.  CODE STATUS: Full code  TOTAL TIME TAKING CARE OF THIS PATIENT: 37 minutes.   POSSIBLE D/C IN 2 DAYS, DEPENDING ON CLINICAL CONDITION.   Gladstone Lighter M.D on 08/11/2015 at 3:42 PM  Between 7am to 6pm - Pager - [phone]  After 6pm go to [email or web address] - password [email or web address] [facility]  Office  [phone]  CC: Primary care physician; Perrin Maltese, MD

## 2015-08-11 NOTE — Consult Note (Signed)
Reason for Consult: syncope Referring Physician:  Dr. Ammie [name] is an 59 y.o. male.  HPI:  59 yo RHD M presents to [facility] due to multiple falls.  Pt reports that he has had several over the last year that are progressively worsening.  Pt remembers most of the falls except the one yesterday when he said he stood up and that's all he remembers.  Pt has rare loss of bladder and is never confused after these episodes.  Pt has never bitten his tongue either.  Pt does report that these episodes always occur when he is getting up or moving around.   Pt has not focal weakness/numbness or headache.  He does feel dizzy prior to most episodes.  Past Medical History  Diagnosis Date  . COPD (chronic obstructive pulmonary disease) (HCC)   . Hypertension   . Migraine   . Asthma   . BPH (benign prostatic hyperplasia)   . Deviated nasal septum   . DVT (deep venous thrombosis) (HCC)   . Mantle cell lymphoma [facility])     Past Surgical History  Procedure Laterality Date  . Throat surgery    . Barryx N/A   . Esophagogastroduodenoscopy (egd) with propofol N/A 02/23/2015    Procedure: ESOPHAGOGASTRODUODENOSCOPY (EGD) WITH PROPOFOL;  Surgeon: Alexander Cullens, MD;  Location: [facility] ENDOSCOPY;  Service: Gastroenterology;  Laterality: N/A;  . Esophagogastroduodenoscopy (egd) with propofol N/A 05/04/2015    Procedure: ESOPHAGOGASTRODUODENOSCOPY (EGD) WITH PROPOFOL;  Surgeon: Alexander Cullens, MD;  Location: [facility] ENDOSCOPY;  Service: Gastroenterology;  Laterality: N/A;  . Tonsillectomy    . Esophagogastroduodenoscopy (egd) with propofol N/A 07/27/2015    Procedure: ESOPHAGOGASTRODUODENOSCOPY (EGD) WITH PROPOFOL;  Surgeon: Alexander Cullens, MD;  Location: [facility] ENDOSCOPY;  Service: Gastroenterology;  Laterality: N/A;    Family History  Problem Relation Age of Onset  . Mesothelioma Father   . Breast cancer Mother     Social History:  reports that he has quit smoking. He does not have any smokeless tobacco history on file. He  reports that he does not drink alcohol or use illicit drugs.  Allergies:  Allergies  Allergen Reactions  . Ativan [Lorazepam] Other (See Comments)    Reaction:  Dizziness   . Bee Venom     Medications: personally reviewed by me   Results for orders placed or performed during the hospital encounter of 08/10/15 (from the past 48 hour(s))  Basic metabolic panel     Status: Abnormal   Collection Time: 08/10/15  1:44 AM  Result Value Ref Range   Sodium 132 (L) 135 - 145 mmol/L   Potassium 3.2 (L) 3.5 - 5.1 mmol/L   Chloride 100 (L) 101 - 111 mmol/L   CO2 27 22 - 32 mmol/L   Glucose, Bld 106 (H) 65 - 99 mg/dL   BUN 12 6 - 20 mg/dL   Creatinine, Ser 0.37 0.61 - 1.24 mg/dL   Calcium 8.4 (L) 8.9 - 10.3 mg/dL   GFR calc non Af Amer >60 >60 mL/min   GFR calc Af Amer >60 >60 mL/min    Comment: (NOTE) The eGFR has been calculated using the CKD EPI equation. This calculation has not been validated in all clinical situations. eGFR's persistently <60 mL/min signify possible Chronic Kidney Disease.    Anion gap 5 5 - 15  CBC     Status: Abnormal   Collection Time: 08/10/15  1:44 AM  Result Value Ref Range   WBC 6.3 3.8 - 10.6 K/uL  RBC 4.56 4.40 - 5.90 MIL/uL   Hemoglobin 13.9 13.0 - 18.0 g/dL   HCT 44.1 07.7 - 85.4 %   MCV 87.9 80.0 - 100.0 fL   MCH 30.4 26.0 - 34.0 pg   MCHC 34.6 32.0 - 36.0 g/dL   RDW 04.1 96.7 - 94.7 %   Platelets 142 (L) 150 - 440 K/uL  Troponin I     Status: None   Collection Time: 08/10/15  1:44 AM  Result Value Ref Range   Troponin I <0.03 <0.031 ng/mL    Comment:        NO INDICATION OF MYOCARDIAL INJURY.   Troponin I     Status: None   Collection Time: 08/10/15 11:06 AM  Result Value Ref Range   Troponin I <0.03 <0.031 ng/mL    Comment:        NO INDICATION OF MYOCARDIAL INJURY.   Troponin I     Status: None   Collection Time: 08/10/15  5:13 PM  Result Value Ref Range   Troponin I <0.03 <0.031 ng/mL    Comment:        NO INDICATION  OF MYOCARDIAL INJURY.   Troponin I     Status: None   Collection Time: 08/10/15 10:52 PM  Result Value Ref Range   Troponin I <0.03 <0.031 ng/mL    Comment:        NO INDICATION OF MYOCARDIAL INJURY.   Basic metabolic panel     Status: Abnormal   Collection Time: 08/11/15  5:37 AM  Result Value Ref Range   Sodium 139 135 - 145 mmol/L   Potassium 3.7 3.5 - 5.1 mmol/L   Chloride 105 101 - 111 mmol/L   CO2 32 22 - 32 mmol/L   Glucose, Bld 103 (H) 65 - 99 mg/dL   BUN 13 6 - 20 mg/dL   Creatinine, Ser 3.84 0.61 - 1.24 mg/dL   Calcium 8.4 (L) 8.9 - 10.3 mg/dL   GFR calc non Af Amer >60 >60 mL/min   GFR calc Af Amer >60 >60 mL/min    Comment: (NOTE) The eGFR has been calculated using the CKD EPI equation. This calculation has not been validated in all clinical situations. eGFR's persistently <60 mL/min signify possible Chronic Kidney Disease.    Anion gap 2 (L) 5 - 15  CBC     Status: Abnormal   Collection Time: 08/11/15  5:37 AM  Result Value Ref Range   WBC 4.6 3.8 - 10.6 K/uL   RBC 4.20 (L) 4.40 - 5.90 MIL/uL   Hemoglobin 12.8 (L) 13.0 - 18.0 g/dL   HCT 79.7 (L) 56.7 - 87.0 %   MCV 88.3 80.0 - 100.0 fL   MCH 30.4 26.0 - 34.0 pg   MCHC 34.4 32.0 - 36.0 g/dL   RDW 53.4 95.4 - 91.7 %   Platelets 123 (L) 150 - 440 K/uL  Magnesium     Status: None   Collection Time: 08/11/15  5:37 AM  Result Value Ref Range   Magnesium 2.1 1.7 - 2.4 mg/dL    Dg Chest 2 View  58/83/4435  CLINICAL DATA:  Fall with right rib pain EXAM: CHEST  2 VIEW COMPARISON:  08/09/2015 FINDINGS: Normal heart size and mediastinal contours. Calcified granuloma in the right upper lobe. No acute infiltrate or edema. No effusion or pneumothorax. No acute osseous findings. IMPRESSION: No acute finding. Electronically Signed   By: Alexander [name] M.D.   On: 08/10/2015 02:33   Dg  Chest 2 View  08/09/2015  CLINICAL DATA:  Generalized weakness with multiple falls. EXAM: CHEST  2 VIEW COMPARISON:  05/19/2015  FINDINGS: The heart size and mediastinal contours are within normal limits. Both lungs are clear. The visualized skeletal structures are unremarkable. IMPRESSION: No active cardiopulmonary disease. Electronically Signed   By: Kathreen Devoid   On: 08/09/2015 19:56   Ct Head Wo Contrast  08/09/2015  CLINICAL DATA:  Generalized weakness, multiple falls EXAM: CT HEAD WITHOUT CONTRAST TECHNIQUE: Contiguous axial images were obtained from the base of the skull through the vertex without contrast. COMPARISON:  08/29/2014 FINDINGS: Normal appearance of the intracranial structures. No evidence for acute hemorrhage, mass lesion, midline shift, hydrocephalus or large infarct. No acute bony abnormality. The visualized sinuses demonstrate chronic mucosal thickening diffusely, improved compared 08/29/2014. Mastoids are clear. No acute osseous finding or skull abnormality. Orbits are symmetric. IMPRESSION: No acute intracranial abnormality.  Stable exam. Improving chronic sinus disease. Electronically Signed   By: Jerilynn Mages.  Shick M.D.   On: 08/09/2015 19:50   Mr Jeri Cos PJ Contrast  08/10/2015  CLINICAL DATA:  Syncopal episode with dizziness.  Possible seizure. EXAM: MRI HEAD WITHOUT AND WITH CONTRAST TECHNIQUE: Multiplanar, multiecho pulse sequences of the brain and surrounding structures were obtained without and with intravenous contrast. CONTRAST:  6m MULTIHANCE GADOBENATE DIMEGLUMINE 529 MG/ML IV SOLN COMPARISON:  Head CT 08/09/2015 FINDINGS: Diffusion imaging does not show any acute or subacute infarction. The brainstem is normal. No cerebellar abnormality. Within the cerebral hemispheres, there are scattered foci of T2 and FLAIR signal, primarily in the subcortical white matter but with some deep white matter involvement. The pattern is nonspecific and could be due to small vessel disease, migraine related foci, previous head trauma, or conceivably demyelinating disease. No cortical or large vessel territory infarction.  No mass lesion, hemorrhage, hydrocephalus or extra-axial collection. No mesial temporal lesion. After contrast administration, no abnormal enhancement occurs. IMPRESSION: No acute or subacute abnormality. Scattered foci of T2 and FLAIR signal within the cerebral hemispheric white matter that are nonspecific. The differential diagnosis includes small-vessel disease, migraine related foci, previous head trauma or vasculitis, or demyelinating disease. Electronically Signed   By: MNelson ChimesM.D.   On: 08/10/2015 17:05   UKoreaCarotid Bilateral  08/10/2015  CLINICAL DATA:  Syncopal episodes for 2 weeks with visual disturbance EXAM: BILATERAL CAROTID DUPLEX ULTRASOUND TECHNIQUE: GPearline Cablesscale imaging, color Doppler and duplex ultrasound were performed of bilateral carotid and vertebral arteries in the neck. COMPARISON:  None. FINDINGS: Criteria: Quantification of carotid stenosis is based on velocity parameters that correlate the residual internal carotid diameter with NASCET-based stenosis levels, using the diameter of the distal internal carotid lumen as the denominator for stenosis measurement. The following velocity measurements were obtained: RIGHT ICA:  65/19 cm/sec CCA:  1093/26cm/sec SYSTOLIC ICA/CCA RATIO:  0.6 DIASTOLIC ICA/CCA RATIO:  0.4 ECA:  88 cm/sec LEFT ICA:  75/14 cm/sec CCA:  1712/45cm/sec SYSTOLIC ICA/CCA RATIO:  0.7 DIASTOLIC ICA/CCA RATIO:  0.7 ECA:  100 cm/sec RIGHT CAROTID ARTERY: Minor atherosclerotic plaque formation. No hemodynamically significant right ICA stenosis, velocity elevation, or turbulent flow. Degree of narrowing less than 50%. RIGHT VERTEBRAL ARTERY:  Antegrade LEFT CAROTID ARTERY: Similar scattered minor atherosclerotic plaque formation. No hemodynamically significant left ICA stenosis, velocity elevation, or turbulent flow. LEFT VERTEBRAL ARTERY:  Antegrade IMPRESSION: Minor carotid atherosclerosis. No hemodynamically significant ICA stenosis. Degree of narrowing less than 50%  bilaterally. Patent antegrade vertebral flow bilaterally. Electronically Signed   By:  M.  Shick M.D.   On: 08/10/2015 11:35    Review of Systems  Constitutional: Negative.   HENT: Negative.   Eyes: Negative.   Respiratory: Negative.   Cardiovascular: Negative.   Gastrointestinal: Negative.   Genitourinary: Negative.   Musculoskeletal: Negative.   Skin: Negative.   Neurological: Positive for dizziness and loss of consciousness. Negative for tingling, tremors, sensory change, speech change, focal weakness and seizures.  Psychiatric/Behavioral: Negative.    Blood pressure 105/61, pulse 66, temperature 97.8 F (36.6 C), temperature source Oral, resp. rate 18, height '5\' 10"'$  (1.778 m), weight 180 lb (81.647 kg), SpO2 98 %. Physical Exam  Nursing note and vitals reviewed. Constitutional: He appears well-developed and well-nourished. No distress.  HENT:  Head: Normocephalic and atraumatic.  Right Ear: External ear normal.  Left Ear: External ear normal.  Nose: Nose normal.  Mouth/Throat: Oropharynx is clear and moist.  Eyes: Conjunctivae and EOM are normal. Pupils are equal, round, and reactive to light. No scleral icterus.  Neck: Normal range of motion. Neck supple.  Cardiovascular: Normal rate, regular rhythm, normal heart sounds and intact distal pulses.   No murmur heard. Respiratory: Effort normal and breath sounds normal. No respiratory distress.  GI: Soft. Bowel sounds are normal. He exhibits no distension.  Musculoskeletal: Normal range of motion. He exhibits no edema.  Neurological:  A+Ox3, nl speech and language PERRLA, EOMI with slowed saccades, nl VF, face symmetric with hypomimia, tongue midline 5/5 B, mild resting tremor on R, nl tone, bradykinesia R>L FTN and HTS WNL 1+/4 B, mute plantars Intact temp and pin B  Skin: Skin is dry. Rash noted. He is not diaphoretic.  Psychiatric: He has a normal mood and affect.   CT of head personally reviewed by me and shows mild  atrophy   Pt has been having progressing worsening ataxia, syncopal episodes. Not convinced last episode with falling and ? Convulsions likely convulsive syncope in setting othrostasis  I am not convinced this is parkinsonism.  Pt does have mask like face, but tone is completely normal.  I believe that sinemet will worsen his orthostasis.  He also has history of mantel cell lymphoma which contributes to progressive weakness and was on chemo, but would not cause such severe orthostatsis  MRI reviewed, no acute abnormality. No clear signs of hot bun sign (MSA), degeneration cerebellum (CBD), degeneration of midbrain(PSP) which are all parkinsonian plus syndromes. They do not respond to dopamine agents and progress faster then parkinson's.  No family hx of Parkinson's dz.    This could also be early signs of Levy body dementia.    Agree with hydration and midodrine/ florinef.    It is difficult to place ataxia/dizziness but could be due to orthostasis, but id does not appear to be peripheral(such as inner ear) but likely central in nature  Will obtain CTA head.   Consider d/c metoprolol that can cause orthostasis.  If work up negative, needs to see movement disorder specialist/cognitive neurologist at academic center such [facility], [facility] 08/11/2015, 7:02 PM

## 2015-08-11 NOTE — Evaluation (Signed)
Physical Therapy Evaluation Patient Details Name: Alexander [name] MRN: [id number] DOB: Dec 31, 1955 Today's Date: 08/11/2015   History of Present Illness  Patient is a 59 y/o male that presents after having multiple falls/loss of consciousness episodes over the last 2-3 weeks. Patient reports he feels light headed and has had several of these episodes occur with no warning "it just goes black". Patient does have a history of mantle cell lymphoma.   Clinical Impression  Patient is a pleasant 59 y/o male that presents with complaints of multiple episodes of falls or loss of consciousness for the past 2-3 weeks. He reports feeling "light-headed" and indeed does show mild orthostasis with sit to stand transfer in this session. During gait testing, with vertical and horizontal head turns he consistently loses his footing and loses balance to the left. In vestibular testing, with R head thrust mild nystagmus noted to L. In both cases of head turns/VOR x1 testing patient has repositioning of eyes from superior L field of vision to central with complaints of increased dizziness and became sick to his stomach. Of concern is that there may be central deficits in vestibular system as this is not a classic case of peripheral vertigo (dizzy/light headed throughout exam, no onset/offset mechanism like Dix-Hallpike). He also notes recent onset diploplia. While he is able to complete household distance mobility with AD in this setting, there is concern that these symptoms will continue to manifest leading to injurious fall. Would recommend HHPT or [place] [facility] for progression of balance activities as well as 24/7 assistance given his high falls risk.    Follow Up Recommendations Home health PT    Equipment Recommendations  Rolling walker with 5" wheels    Recommendations for Other Services       Precautions / Restrictions Precautions Precautions: Fall Restrictions Weight Bearing Restrictions: No       Mobility  Bed Mobility Overal bed mobility: Independent             General bed mobility comments: No deficits noted, though on sit to supine transfer patient expressed pain in his R rib cage.   Transfers Overall transfer level: Needs assistance Equipment used: Rolling walker (2 wheeled) Transfers: Sit to/from Stand Sit to Stand: Supervision         General transfer comment: Patient initially takes prolonged time to use SPC, no deficits with RW in transfer.   Ambulation/Gait Ambulation/Gait assistance: Min guard Ambulation Distance (Feet): 200 Feet (75' with RW) Assistive device: Straight cane Gait Pattern/deviations: WFL(Within Functional Limits)   Gait velocity interpretation: Below normal speed for age/gender General Gait Details: Patient has lateral loss of balance and difficulty with foot placement with head turns both vertically and horizontally with SPC, indicating he is at high risk of falling. No other deficits with SPC, with RW continues to show lateral loss of balance with head turns.   Stairs            Wheelchair Mobility    Modified Rankin (Stroke Patients Only)       Balance Overall balance assessment: Needs assistance Sitting-balance support: No upper extremity supported Sitting balance-Leahy Scale: Good Sitting balance - Comments: No balance deficits in sitting.    Standing balance support: Single extremity supported Standing balance-Leahy Scale: Fair Standing balance comment: Modified DGI of 7/12 indicating he is at high risk of falling with SPC.  Pertinent Vitals/Pain Pain Assessment: Faces Faces Pain Scale: Hurts little more Pain Location: R rib cage during sit to supine transfer.  Pain Descriptors / Indicators: Aching Pain Intervention(s): Limited activity within patient's tolerance;Monitored during session    [place] expects to be discharged to:: Private  residence Living Arrangements: Spouse/significant other Available Help at Discharge: Family (Lives with wife) Type of Home: House       Home Layout: One level        Prior Function Level of Independence: Independent with assistive device(s)         Comments: Patient uses cane at home, RW in the community.      Hand Dominance        Extremity/Trunk Assessment   Upper Extremity Assessment: Overall WFL for tasks assessed           Lower Extremity Assessment: Overall WFL for tasks assessed         Communication   Communication: No difficulties  Cognition Arousal/Alertness: Awake/alert Behavior During Therapy: WFL for tasks assessed/performed Overall Cognitive Status: Within Functional Limits for tasks assessed                      General Comments      Exercises        Assessment/Plan    PT Assessment Patient needs continued PT services  PT Diagnosis Difficulty walking;Abnormality of gait   PT Problem List Decreased strength;Decreased knowledge of use of DME;Decreased safety awareness;Decreased balance;Decreased mobility  PT Treatment Interventions Gait training;Patient/family education;DME instruction;Therapeutic activities;Therapeutic exercise;Balance training   PT Goals (Current goals can be found in the Care Plan section) Acute Rehab PT Goals Patient Stated Goal: To figure out why he's having these spells.  PT Goal Formulation: With patient Time For Goal Achievement: 08/25/15 Potential to Achieve Goals: Good Additional Goals Additional Goal #1:  (Pt will demonstrate low falls risk on Berg Balance Test)    Frequency Min 2X/week   Barriers to discharge        Co-evaluation               End of Session Equipment Utilized During Treatment: Gait belt Activity Tolerance: Patient tolerated treatment well (Felt light headed and nauseated) Patient left: in bed;with call bell/phone within reach;with bed alarm set Nurse Communication:  Mobility status (Concern for central involvement)    Functional Assessment Tool Used: Modified DGI  Functional Limitation: Mobility: Walking and moving around Mobility: Walking and Moving Around Current Status [id number]): At least 20 percent but less than 40 percent impaired, limited or restricted Mobility: Walking and Moving Around Goal Status [phone]): At least 20 percent but less than 40 percent impaired, limited or restricted    Time: 1127-1149 PT Time Calculation (min) (ACUTE ONLY): 22 min   Charges:   PT Evaluation $Initial PT Evaluation Tier I: 1 Procedure     PT G Codes:   PT G-Codes **NOT FOR INPATIENT CLASS** Functional Assessment Tool Used: Modified DGI  Functional Limitation: Mobility: Walking and moving around Mobility: Walking and Moving Around Current Status [id number]): At least 20 percent but less than 40 percent impaired, limited or restricted Mobility: Walking and Moving Around Goal Status [phone]): At least 20 percent but less than 40 percent impaired, limited or restricted   Kerman Passey, PT, DPT    08/11/2015, 1:17 PM

## 2015-08-11 NOTE — Care Management (Signed)
PT recommending home health PT.  Patient has [facility] benefits.  Spoke with Rosanne Sack regarding patient. [facility] aware of observation admission.  Per [facility] patient will have to have PT set up through his PCP.  Per [facility] requested Updated info faxed to Jethro Bolus with note stating "Need home health PT arranged by pcp (Dr. Bing Ree) to assist with disposition.  Patient will likely discharge in 24-48 hrs).  Patient notified that if he is discharged prior to home health PT being arranged he will need to make a follow up appointment with his PCP to arrange home health PT on an outpatient basis.

## 2015-08-12 MED ORDER — MIDODRINE HCL 2.5 MG PO TABS: 2.5000 mg | ORAL_TABLET | Freq: Two times a day (BID) | ORAL | Status: DC

## 2015-08-12 MED ORDER — OXYCODONE HCL 10 MG PO TABS: 10.0000 mg | ORAL_TABLET | ORAL | Status: DC | PRN

## 2015-08-12 NOTE — Progress Notes (Signed)
Pt discharged to home via wc.  Instructions and rx given to pt.  Questions answered.  No distress.  

## 2015-08-12 NOTE — Discharge Summary (Signed)
[place] [facility] [place] NAME: Alexander [name]    MR#:  [id number]  DATE OF BIRTH:  12/26/1955  DATE OF ADMISSION:  08/10/2015 ADMITTING PHYSICIAN: Saundra Shelling, MD  DATE OF DISCHARGE: 08/12/2015 10:40 AM  PRIMARY CARE PHYSICIAN: Perrin Maltese, MD    ADMISSION DIAGNOSIS:  Syncope [R55]  DISCHARGE DIAGNOSIS:  Principal Problem:   Syncope and collapse Active Problems:   Syncope   Protein-calorie malnutrition, severe   SECONDARY DIAGNOSIS:   Past Medical History  Diagnosis Date  . COPD (chronic obstructive pulmonary disease) ([place])   . Hypertension   . Migraine   . Asthma   . BPH (benign prostatic hyperplasia)   . Deviated nasal septum   . DVT (deep venous thrombosis) ([place])   . Mantle cell lymphoma [facility])     HOSPITAL COURSE:   59 year old male with past medical history significant for hypertension, COPD, DVT on Xarelto, mantle cell lymphoma presents to the hospital secondary to recurrent falls.  #1 recurrent falls with syncope- Appreciate neuro consult - ? Likely convulsive syncope from orthostatic hypotension - midodrine started, no parkinsonian symptoms/signs. Sinemet discontinued- patient feels better after stopping sinemet -likely central causes for vertigo. 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  #2 mantle cell lymphoma-follows up with oncology. Has chronic bone pain. -Continue his MS Contin 3 times a day and also on oxycodone as needed. - pain at baseline. Has a follow up with his physician in 10 days  #3 COPD-appears stable.  Continue home inhalers.  #4 history of DVT-on Xarelto.  #5 hypertension- toprol discontinued due to orthostatic hypotension  Discharge today  DISCHARGE CONDITIONS:   stable  CONSULTS OBTAINED:  Treatment Team:  Leotis Pain, MD  DRUG ALLERGIES:   Allergies  Allergen Reactions  . Ativan [Lorazepam] Other (See Comments)    Reaction:  Dizziness   . Bee Venom     DISCHARGE MEDICATIONS:   Discharge Medication List as of 08/12/2015 10:21 AM    START taking these medications   Details  midodrine (PROAMATINE) 2.5 MG tablet Take 1 tablet (2.5 mg total) by mouth 2 (two) times daily with a meal., Starting 08/12/2015, Until Discontinued, Print      CONTINUE these medications which have CHANGED   Details  Oxycodone HCl 10 MG TABS Take 1 tablet (10 mg total) by mouth every 4 (four) hours as needed., Starting 08/12/2015, Until Discontinued, Print      CONTINUE these medications which have NOT CHANGED   Details  docusate sodium (COLACE) 100 MG capsule Take 200 mg by mouth 2 (two) times daily as needed for mild constipation., Until Discontinued, Historical Med    fluticasone (FLONASE) 50 MCG/ACT nasal spray Place 2 sprays into both nostrils daily as needed for rhinitis. , Until Discontinued, Historical Med    loperamide (IMODIUM) 2 MG capsule Take 2 mg by mouth as needed for diarrhea or loose stools., Until Discontinued, Historical Med    promethazine (PHENERGAN) 25 MG suppository Place 1 suppository (25 mg total) rectally every 6 (six) hours as needed for nausea or vomiting., Starting 04/25/2015, Until Wed 04/24/16, Print  rivaroxaban (XARELTO) 20 MG TABS tablet Take 1 tablet (20 mg total) by mouth daily with supper., Starting 07/18/2015, Until Discontinued, Normal    morphine (MSIR) 30 MG tablet Take 30 mg by mouth 3 (three) times daily., Until Discontinued, Historical Med    OLANZapine (ZYPREXA) 5 MG tablet Take 2.5 mg by mouth every 6 (six) hours as needed (for GI upset). ,  Until Discontinued, Historical Med    ondansetron (ZOFRAN ODT) 4 MG disintegrating tablet Take 1 tablet (4 mg total) by mouth every 8 (eight) hours as needed for nausea or vomiting., Starting 07/04/2015, Until Discontinued, Print    senna (SENOKOT) 8.6 MG TABS tablet Take 2 tablets by mouth 2 (two) times daily as needed for mild constipation., Until Discontinued, Historical Med    SUMAtriptan (IMITREX) 50 MG tablet Take 50 mg by mouth every 2 (two) hours as needed for migraine. , Until Discontinued, Historical Med    tamsulosin (FLOMAX) 0.4 MG CAPS capsule Take 0.4 mg by mouth at bedtime., Until Discontinued, Historical Med    traZODone (DESYREL) 50 MG tablet Take 50 mg by mouth at bedtime., Until Discontinued, Historical Med    urea (CARMOL) 20 % cream Apply 1 application topically as needed (for dry skin on feet). , Until Discontinued, Historical Med      STOP taking these medications     dicyclomine (BENTYL) 20 MG tablet      azithromycin (ZITHROMAX) 250 MG tablet      cefUROXime (CEFTIN) 250 MG tablet      feeding supplement (BOOST / RESOURCE BREEZE) LIQD      metoprolol succinate (TOPROL XL) 50 MG 24 hr tablet          DISCHARGE INSTRUCTIONS:   1. PCP f/u in 1-2 weeks 2. F/u with neurology- movement disorders clinic in 2-4 weeks 3. Home health physical therapy through [place] 4. Fall precautions, No driving  If you experience worsening of your admission symptoms, develop shortness of breath, life threatening emergency, suicidal or homicidal thoughts you must seek medical attention immediately by calling 911 or calling your MD immediately  if symptoms less severe.  You Must read complete instructions/literature along with all the possible adverse reactions/side effects for all the Medicines you take and that have been prescribed to you. Take any new Medicines after you have completely understood and accept all the possible adverse reactions/side effects.   Please note  You  were cared for by a hospitalist during your hospital stay. If you have any questions about your discharge medications or the care you received while you were in the hospital after you are discharged, you can call the unit and asked to speak with the hospitalist on call if the hospitalist that took care of you is not available. Once you are discharged, your primary care physician will handle any further medical issues. Please note that NO REFILLS for any discharge medications will be authorized once you are discharged, as it is imperative that you return to your primary care physician (or establish a relationship with a primary care physician if you do not have one) for your aftercare needs so that they can reassess your need for medications and monitor your lab values.    Today   CHIEF COMPLAINT:   Chief Complaint  Patient presents with  . Loss of Consciousness    VITAL SIGNS:  Blood pressure 122/69, pulse 67, temperature 98.4 F (36.9 C), temperature source Oral, resp. rate 17, height 5\' 10"  (1.778 m), weight 81.647 kg (  180 lb), SpO2 100 %.  I/O:   Intake/Output Summary (Last 24 hours) at 08/12/15 1101 Last data filed at 08/12/15 1024  Gross per 24 hour  Intake    513 ml  Output   1200 ml  Net   -687 ml    PHYSICAL EXAMINATION:   Physical Exam  GENERAL: 59 y.o.-year-old patient lying in the bed with no acute distress.  EYES: Pupils equal, round, reactive to light and accommodation. No scleral icterus. Extraocular muscles intact.  HEENT: Head atraumatic, normocephalic. Oropharynx and nasopharynx clear.  NECK: Supple, no jugular venous distention. No thyroid enlargement, no tenderness.  LUNGS: Normal breath sounds bilaterally, no wheezing, rales,rhonchi or crepitation. No use of accessory muscles of respiration.  CARDIOVASCULAR: S1, S2 normal. No murmurs, rubs, or gallops.  ABDOMEN: Soft, nontender, nondistended. Bowel sounds present. No organomegaly or mass.   EXTREMITIES: No pedal edema, cyanosis, or clubbing.  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 Muscle strength 5/5 in all extremities. Sensation intact. Gait not checked. No nystagmus PSYCHIATRIC: The patient is alert and oriented x 3.  SKIN: No obvious rash, lesion, or ulcer.   DATA REVIEW:   CBC  Recent Labs Lab 08/11/15 0537  WBC 4.6  HGB 12.8*  HCT 37.1*  PLT 123*    Chemistries   Recent Labs Lab 08/09/15 1921  08/11/15 0537  NA 136  < > 139  K 3.6  < > 3.7  CL 103  < > 105  CO2 29  < > 32  GLUCOSE 105*  < > 103*  BUN 11  < > 13  CREATININE 0.78  < > 0.84  CALCIUM 8.8*  < > 8.4*  MG  --   --  2.1  AST 15  --   --   ALT 11*  --   --   ALKPHOS 86  --   --   BILITOT 0.9  --   --   < > = values in this interval not displayed.  Cardiac Enzymes  Recent Labs Lab 08/10/15 2252  TROPONINI <0.03    Microbiology Results  Results for orders placed or performed during the hospital encounter of 07/16/15  C difficile quick scan w PCR reflex     Status: None   Collection Time: 07/18/15  5:30 PM  Result Value Ref Range Status   C Diff antigen NEGATIVE NEGATIVE Final   C Diff toxin NEGATIVE NEGATIVE Final   C Diff interpretation Negative for C. difficile  Final    RADIOLOGY:  Ct Angio Head W/cm &/or Wo Cm  08/11/2015  CLINICAL DATA:  Dizziness.  Seizure and weakness.  Headache.  Falls. EXAM: CT ANGIOGRAPHY HEAD TECHNIQUE: Multidetector CT imaging of the head was performed using the standard protocol during bolus administration of intravenous contrast. Multiplanar CT image reconstructions and MIPs were obtained to evaluate the vascular anatomy. CONTRAST:  129mL OMNIPAQUE IOHEXOL 350 MG/ML SOLN COMPARISON:  MRI 08/10/2015 FINDINGS: CT HEAD Brain: Mild atrophy. Negative for hydrocephalus. Mild chronic microvascular ischemic change in the white matter.  Negative for acute infarct.  Negative for hemorrhage or mass. Calvarium and skull base: Negative Paranasal sinuses: Medial antrectomy bilaterally. Mild mucosal edema in the paranasal sinuses bilaterally without air-fluid level. Mastoid sinus clear bilaterally. Orbits: Negative CTA HEAD Anterior circulation: Cavernous carotid widely patent bilaterally without stenosis or aneurysm. Anterior and middle cerebral artery appear normal bilaterally without stenosis  or occlusion Posterior circulation: Both vertebral arteries patent to the basilar. Left vertebral artery dominant. PICA patent bilaterally. Basilar widely patent. Superior cerebellar and posterior cerebral arteries widely patent without stenosis. Venous sinuses: Patent Anatomic variants: Negative for cerebral aneurysm Delayed phase:Normal enhancement on delayed imaging. IMPRESSION: Normal CTA of the head. No significant intracranial stenosis. Negative for aneurysm Mild chronic microvascular ischemia in the white matter. No acute intracranial abnormality. Electronically Signed   By: Franchot Gallo M.D.   On: 08/11/2015 20:43   Mr Jeri Cos [id number] Contrast  08/10/2015  CLINICAL DATA:  Syncopal episode with dizziness.  Possible seizure. EXAM: MRI HEAD WITHOUT AND WITH CONTRAST TECHNIQUE: Multiplanar, multiecho pulse sequences of the brain and surrounding structures were obtained without and with intravenous contrast. CONTRAST:  31mL MULTIHANCE GADOBENATE DIMEGLUMINE 529 MG/ML IV SOLN COMPARISON:  Head CT 08/09/2015 FINDINGS: Diffusion imaging does not show any acute or subacute infarction. The brainstem is normal. No cerebellar abnormality. Within the cerebral hemispheres, there are scattered foci of T2 and FLAIR signal, primarily in the subcortical white matter but with some deep white matter involvement. The pattern is nonspecific and could be due to small vessel disease, migraine related foci, previous head trauma, or conceivably demyelinating disease. No cortical  or large vessel territory infarction. No mass lesion, hemorrhage, hydrocephalus or extra-axial collection. No mesial temporal lesion. After contrast administration, no abnormal enhancement occurs. IMPRESSION: No acute or subacute abnormality. Scattered foci of T2 and FLAIR signal within the cerebral hemispheric white matter that are nonspecific. The differential diagnosis includes small-vessel disease, migraine related foci, previous head trauma or vasculitis, or demyelinating disease. Electronically Signed   By: Nelson Chimes M.D.   On: 08/10/2015 17:05    EKG:   Orders placed or performed during the hospital encounter of 08/10/15  . ED EKG  . ED EKG      Management plans discussed with the patient, family and they are in agreement.  CODE STATUS:     Code Status Orders        Start     Ordered   08/10/15 0829  Full code   Continuous     08/10/15 0828    Advance Directive Documentation        Most Recent Value   Type of Advance Directive  Healthcare Power of Attorney, Living will   Pre-existing out of facility DNR order (yellow form or pink MOST form)     "MOST" Form in Place?        TOTAL TIME TAKING CARE OF THIS PATIENT: 37 minutes.    Gladstone Lighter M.D on 08/12/2015 at 11:01 AM  Between 7am to 6pm - Pager - [phone]  After 6pm go to [email or web address] - password [email or web address] [facility]  Office  [phone]  CC: Primary care physician; Perrin Maltese, MD

## 2015-08-12 NOTE — Care Management Note (Signed)
Case Management Note  Patient Details  Name: Alexander [name] MRN: [id number] Date of Birth: Jan 09, 1956  Subjective/Objective:   Discharge plan:  See Alexander [name], Case Manager, note dated 08/11/15. Per her note [name] is to call his PCP to arrange outpatient PT.                  Action/Plan:   Expected Discharge Date:                  Expected Discharge Plan:     In-House Referral:     Discharge planning Services     Post Acute Care Choice:    Choice offered to:     DME Arranged:    DME Agency:     HH Arranged:    [place] Agency:     Status of Service:     Medicare Important Message Given:    Date Medicare IM Given:    Medicare IM give by:    Date Additional Medicare IM Given:    Additional Medicare Important Message give by:     If discussed at [place] of Stay Meetings, dates discussed:    Additional Comments:  Tyse Auriemma A, RN 08/12/2015, 8:40 AM

## 2015-08-17 ENCOUNTER — Emergency Department: Payer: Medicaid Other

## 2015-08-17 ENCOUNTER — Inpatient Hospital Stay
Admission: EM | Admit: 2015-08-17 | Discharge: 2015-08-19 | DRG: 190 | Disposition: A | Discharge status: Home / Self Care | Payer: Medicaid Other | Attending: Internal Medicine | Admitting: Internal Medicine

## 2015-08-17 DIAGNOSIS — I951 Orthostatic hypotension: Secondary | ICD-10-CM | POA: Diagnosis present

## 2015-08-17 DIAGNOSIS — J189 Pneumonia, unspecified organism: Secondary | ICD-10-CM | POA: Diagnosis present

## 2015-08-17 DIAGNOSIS — L89152 Pressure ulcer of sacral region, stage 2: Secondary | ICD-10-CM | POA: Diagnosis present

## 2015-08-17 DIAGNOSIS — I10 Essential (primary) hypertension: Secondary | ICD-10-CM | POA: Diagnosis present

## 2015-08-17 DIAGNOSIS — N400 Enlarged prostate without lower urinary tract symptoms: Secondary | ICD-10-CM | POA: Diagnosis present

## 2015-08-17 DIAGNOSIS — J440 Chronic obstructive pulmonary disease with acute lower respiratory infection: Secondary | ICD-10-CM | POA: Diagnosis not present

## 2015-08-17 DIAGNOSIS — Z91030 Bee allergy status: Secondary | ICD-10-CM | POA: Diagnosis not present

## 2015-08-17 DIAGNOSIS — J441 Chronic obstructive pulmonary disease with (acute) exacerbation: Secondary | ICD-10-CM | POA: Diagnosis present

## 2015-08-17 DIAGNOSIS — Z79891 Long term (current) use of opiate analgesic: Secondary | ICD-10-CM | POA: Diagnosis not present

## 2015-08-17 DIAGNOSIS — Z79899 Other long term (current) drug therapy: Secondary | ICD-10-CM

## 2015-08-17 DIAGNOSIS — G8929 Other chronic pain: Secondary | ICD-10-CM | POA: Diagnosis present

## 2015-08-17 DIAGNOSIS — Y95 Nosocomial condition: Secondary | ICD-10-CM | POA: Diagnosis present

## 2015-08-17 DIAGNOSIS — G43909 Migraine, unspecified, not intractable, without status migrainosus: Secondary | ICD-10-CM | POA: Diagnosis present

## 2015-08-17 DIAGNOSIS — Z993 Dependence on wheelchair: Secondary | ICD-10-CM

## 2015-08-17 DIAGNOSIS — J309 Allergic rhinitis, unspecified: Secondary | ICD-10-CM | POA: Diagnosis present

## 2015-08-17 DIAGNOSIS — Z888 Allergy status to other drugs, medicaments and biological substances status: Secondary | ICD-10-CM

## 2015-08-17 DIAGNOSIS — C8310 Mantle cell lymphoma, unspecified site: Secondary | ICD-10-CM | POA: Diagnosis present

## 2015-08-17 DIAGNOSIS — Z86718 Personal history of other venous thrombosis and embolism: Secondary | ICD-10-CM

## 2015-08-17 DIAGNOSIS — L8990 Pressure ulcer of unspecified site, unspecified stage: Secondary | ICD-10-CM | POA: Insufficient documentation

## 2015-08-17 DIAGNOSIS — J45909 Unspecified asthma, uncomplicated: Secondary | ICD-10-CM | POA: Diagnosis present

## 2015-08-17 HISTORY — DX: Ulcer of esophagus without bleeding: K22.10

## 2015-08-17 LAB — COMPREHENSIVE METABOLIC PANEL
ALBUMIN: 4.5 g/dL (ref 3.5–5.0)
ALK PHOS: 89 U/L (ref 38–126)
ALT: 10 U/L — ABNORMAL LOW (ref 17–63)
ANION GAP: 7 (ref 5–15)
AST: 12 U/L — AB (ref 15–41)
BILIRUBIN TOTAL: 0.8 mg/dL (ref 0.3–1.2)
BUN: 20 mg/dL (ref 6–20)
CALCIUM: 8.8 mg/dL — AB (ref 8.9–10.3)
CO2: 31 mmol/L (ref 22–32)
Chloride: 101 mmol/L (ref 101–111)
Creatinine, Ser: 0.76 mg/dL (ref 0.61–1.24)
GFR calc Af Amer: >60 mL/min (ref >60)
GFR calc non Af Amer: >60 mL/min (ref >60)
GLUCOSE: 142 mg/dL — AB (ref 65–99)
Potassium: 3.5 mmol/L (ref 3.5–5.1)
SODIUM: 139 mmol/L (ref 135–145)
Total Protein: 7 g/dL (ref 6.5–8.1)

## 2015-08-17 LAB — TYPE AND SCREEN
ABO/RH(D): A POS
Antibody Screen: NEGATIVE

## 2015-08-17 LAB — TROPONIN I

## 2015-08-17 LAB — CBC
HEMATOCRIT: 38.8 % — AB (ref 40.0–52.0)
HEMOGLOBIN: 13.4 g/dL (ref 13.0–18.0)
MCH: 30.2 pg (ref 26.0–34.0)
MCHC: 34.4 g/dL (ref 32.0–36.0)
MCV: 87.7 fL (ref 80.0–100.0)
Platelets: 146 10*3/uL — ABNORMAL LOW (ref 150–440)
RBC: 4.42 MIL/uL (ref 4.40–5.90)
RDW: 13.4 % (ref 11.5–14.5)
WBC: 5.2 10*3/uL (ref 3.8–10.6)

## 2015-08-17 LAB — HEMOGLOBIN AND HEMATOCRIT, BLOOD
HEMATOCRIT: 39.2 % — AB (ref 40.0–52.0)
HEMOGLOBIN: 13.7 g/dL (ref 13.0–18.0)

## 2015-08-17 LAB — LIPASE, BLOOD: Lipase: 16 U/L (ref 11–51)

## 2015-08-17 LAB — ABO/RH: ABO/RH(D): A POS

## 2015-08-17 MED ORDER — HYDROMORPHONE HCL 1 MG/ML IJ SOLN
1.0000 mg | INTRAMUSCULAR | Status: AC
  Administered 2015-08-17: 1 mg via INTRAVENOUS
  Filled 2015-08-17: qty 1

## 2015-08-17 MED ORDER — ONDANSETRON HCL 4 MG/2ML IJ SOLN
4.0000 mg | INTRAMUSCULAR | Status: DC | PRN
  Administered 2015-08-18 – 2015-08-19 (×2): 4 mg via INTRAVENOUS
  Filled 2015-08-17 (×2): qty 2

## 2015-08-17 MED ORDER — IPRATROPIUM-ALBUTEROL 0.5-2.5 (3) MG/3ML IN SOLN
3.0000 mL | Freq: Once | RESPIRATORY_TRACT | Status: AC
  Administered 2015-08-17: 3 mL via RESPIRATORY_TRACT
  Filled 2015-08-17 (×2): qty 3

## 2015-08-17 MED ORDER — RIVAROXABAN 20 MG PO TABS
20.0000 mg | ORAL_TABLET | Freq: Every day | ORAL | Status: DC
  Administered 2015-08-18: 20 mg via ORAL
  Filled 2015-08-17: qty 1

## 2015-08-17 MED ORDER — IOHEXOL 240 MG/ML SOLN
25.0000 mL | Freq: Once | INTRAMUSCULAR | Status: AC | PRN
  Administered 2015-08-17: 25 mL via ORAL

## 2015-08-17 MED ORDER — SODIUM CHLORIDE 0.9 % IV SOLN
INTRAVENOUS | Status: DC
  Administered 2015-08-17 – 2015-08-18 (×2): via INTRAVENOUS

## 2015-08-17 MED ORDER — TRAZODONE HCL 50 MG PO TABS
50.0000 mg | ORAL_TABLET | Freq: Every day | ORAL | Status: DC
  Administered 2015-08-17 – 2015-08-18 (×2): 50 mg via ORAL
  Filled 2015-08-17 (×2): qty 1

## 2015-08-17 MED ORDER — AMMONIUM LACTATE 12 % EX LOTN
TOPICAL_LOTION | CUTANEOUS | Status: DC | PRN
  Filled 2015-08-17: qty 400

## 2015-08-17 MED ORDER — AZITHROMYCIN 250 MG PO TABS
500.0000 mg | ORAL_TABLET | Freq: Once | ORAL | Status: AC
  Administered 2015-08-17: 500 mg via ORAL
  Filled 2015-08-17: qty 2

## 2015-08-17 MED ORDER — LOPERAMIDE HCL 2 MG PO CAPS: 2.0000 mg | ORAL_CAPSULE | ORAL | Status: DC | PRN

## 2015-08-17 MED ORDER — OXYCODONE HCL 5 MG PO TABS: 10.0000 mg | ORAL_TABLET | ORAL | Status: DC | PRN

## 2015-08-17 MED ORDER — IPRATROPIUM-ALBUTEROL 0.5-2.5 (3) MG/3ML IN SOLN
3.0000 mL | Freq: Once | RESPIRATORY_TRACT | Status: AC
  Administered 2015-08-17: 3 mL via RESPIRATORY_TRACT

## 2015-08-17 MED ORDER — ACETAMINOPHEN 650 MG RE SUPP: 650.0000 mg | Freq: Four times a day (QID) | RECTAL | Status: DC | PRN

## 2015-08-17 MED ORDER — METHYLPREDNISOLONE SODIUM SUCC 40 MG IJ SOLR
40.0000 mg | Freq: Two times a day (BID) | INTRAMUSCULAR | Status: DC
  Administered 2015-08-17: 22:00:00 40 mg via INTRAVENOUS
  Filled 2015-08-17: qty 1

## 2015-08-17 MED ORDER — MORPHINE SULFATE ER 30 MG PO TBCR
30.0000 mg | EXTENDED_RELEASE_TABLET | Freq: Three times a day (TID) | ORAL | Status: DC
  Administered 2015-08-17 – 2015-08-19 (×5): 30 mg via ORAL
  Filled 2015-08-17 (×6): qty 1

## 2015-08-17 MED ORDER — OLANZAPINE 2.5 MG PO TABS
2.5000 mg | ORAL_TABLET | Freq: Four times a day (QID) | ORAL | Status: DC | PRN
  Filled 2015-08-17: qty 1

## 2015-08-17 MED ORDER — ALPRAZOLAM 0.5 MG PO TABS
0.5000 mg | ORAL_TABLET | Freq: Once | ORAL | Status: AC
  Administered 2015-08-17: 0.5 mg via ORAL
  Filled 2015-08-17: qty 1

## 2015-08-17 MED ORDER — IPRATROPIUM-ALBUTEROL 0.5-2.5 (3) MG/3ML IN SOLN
3.0000 mL | RESPIRATORY_TRACT | Status: DC
  Administered 2015-08-18 (×4): 3 mL via RESPIRATORY_TRACT
  Filled 2015-08-17 (×4): qty 3

## 2015-08-17 MED ORDER — DOCUSATE SODIUM 100 MG PO CAPS: 200.0000 mg | ORAL_CAPSULE | Freq: Two times a day (BID) | ORAL | Status: DC | PRN

## 2015-08-17 MED ORDER — IPRATROPIUM-ALBUTEROL 0.5-2.5 (3) MG/3ML IN SOLN
3.0000 mL | Freq: Once | RESPIRATORY_TRACT | Status: AC
  Administered 2015-08-17: 3 mL via RESPIRATORY_TRACT
  Filled 2015-08-17: qty 3

## 2015-08-17 MED ORDER — ACETAMINOPHEN 325 MG PO TABS: 650.0000 mg | ORAL_TABLET | Freq: Four times a day (QID) | ORAL | Status: DC | PRN

## 2015-08-17 MED ORDER — FLUTICASONE PROPIONATE 50 MCG/ACT NA SUSP
2.0000 | Freq: Every day | NASAL | Status: DC | PRN
  Filled 2015-08-17: qty 16

## 2015-08-17 MED ORDER — IOHEXOL 300 MG/ML  SOLN
100.0000 mL | Freq: Once | INTRAMUSCULAR | Status: AC | PRN
  Administered 2015-08-17: 100 mL via INTRAVENOUS

## 2015-08-17 MED ORDER — PREDNISONE 20 MG PO TABS
40.0000 mg | ORAL_TABLET | Freq: Once | ORAL | Status: AC
  Administered 2015-08-17: 40 mg via ORAL
  Filled 2015-08-17 (×2): qty 2

## 2015-08-17 MED ORDER — MIDODRINE HCL 5 MG PO TABS
2.5000 mg | ORAL_TABLET | Freq: Two times a day (BID) | ORAL | Status: DC
  Administered 2015-08-18 – 2015-08-19 (×3): 2.5 mg via ORAL
  Filled 2015-08-17 (×3): qty 1

## 2015-08-17 MED ORDER — UREA 20 % EX CREA: 1.0000 "application " | TOPICAL_CREAM | CUTANEOUS | Status: DC | PRN

## 2015-08-17 MED ORDER — VANCOMYCIN HCL 10 G IV SOLR
1250.0000 mg | Freq: Two times a day (BID) | INTRAVENOUS | Status: DC
  Administered 2015-08-17 – 2015-08-18 (×3): 1250 mg via INTRAVENOUS
  Filled 2015-08-17 (×5): qty 1250

## 2015-08-17 MED ORDER — PIPERACILLIN-TAZOBACTAM 3.375 G IVPB
3.3750 g | Freq: Three times a day (TID) | INTRAVENOUS | Status: DC
  Administered 2015-08-17 – 2015-08-19 (×5): 3.375 g via INTRAVENOUS
  Filled 2015-08-17 (×8): qty 50

## 2015-08-17 MED ORDER — PANTOPRAZOLE SODIUM 40 MG PO TBEC
40.0000 mg | DELAYED_RELEASE_TABLET | ORAL | Status: AC
  Administered 2015-08-17: 40 mg via ORAL
  Filled 2015-08-17: qty 1

## 2015-08-17 MED ORDER — TAMSULOSIN HCL 0.4 MG PO CAPS
0.4000 mg | ORAL_CAPSULE | Freq: Every day | ORAL | Status: DC
  Administered 2015-08-17 – 2015-08-18 (×2): 0.4 mg via ORAL
  Filled 2015-08-17 (×2): qty 1

## 2015-08-17 MED ORDER — SUMATRIPTAN SUCCINATE 50 MG PO TABS: 50.0000 mg | ORAL_TABLET | ORAL | Status: DC | PRN

## 2015-08-17 NOTE — ED Provider Notes (Signed)
[facility] Emergency Department Provider Note REMINDER - THIS NOTE IS NOT A FINAL MEDICAL RECORD UNTIL IT IS SIGNED. UNTIL THEN, THE CONTENT BELOW MAY REFLECT INFORMATION FROM A DOCUMENTATION TEMPLATE, NOT THE ACTUAL PATIENT VISIT. ____________________________________________  Time seen: Approximately 50 PM  I have reviewed the triage vital signs and the nursing notes.   HISTORY  Chief Complaint Hematemesis    HPI Alexander [name] is a 59 y.o. male with multiple medical issues, and recent admission to hospital for weakness and hypotension.He reports that today he has been having slight shortness of breath and a cough with wheezing for about the last 2 days. In addition he called his doctor to set up an appointment, and then developed nausea pain that is in the upper stomach and radiates up towards the chest, he vomited up once at about noon today what he describes as dark vomit with speckles in it. He is concerned it could have blood in it. He denies blood in his stool however.  He also reports that he feels as though he is having mild tach of COPD for which she uses inhalers at home. Eyes productive cough.  He states these having pain in the upper abdomen sort of shoots up into the chest. He has not had any further episodes of weakness or passing out since leaving the hospital.  He is currently taking Xarelto. He is also taking omeprazole, which his doctor recently placed him on daily.  Past Medical History  Diagnosis Date  . COPD (chronic obstructive pulmonary disease) ([place])   . Hypertension   . Migraine   . Asthma   . BPH (benign prostatic hyperplasia)   . Deviated nasal septum   . DVT (deep venous thrombosis) ([place])   . Mantle cell lymphoma [facility])     Patient Active Problem List   Diagnosis Date Noted  . Syncope and collapse 08/10/2015  . Syncope 08/10/2015  . Protein-calorie malnutrition, severe 08/10/2015  . Abdominal pain 07/17/2015  . Nausea &  vomiting 07/17/2015  . Malnutrition of moderate degree 07/17/2015  . Diarrhea 05/18/2015  . DEVIATED SEPTUM 05/18/2007  . ALLERGIC RHINITIS 05/18/2007  . ASTHMA 05/18/2007    Past Surgical History  Procedure Laterality Date  . Throat surgery    . Barryx N/A   . Esophagogastroduodenoscopy (egd) with propofol N/A 02/23/2015    Procedure: ESOPHAGOGASTRODUODENOSCOPY (EGD) WITH PROPOFOL;  Surgeon: Hulen Luster, MD;  Location[facility] ENDOSCOPY;  Service: Gastroenterology;  Laterality: N/A;  . Esophagogastroduodenoscopy (egd) with propofol N/A 05/04/2015    Procedure: ESOPHAGOGASTRODUODENOSCOPY (EGD) WITH PROPOFOL;  Surgeon: Hulen Luster, MD;  Location: [facility] ENDOSCOPY;  Service: Gastroenterology;  Laterality: N/A;  . Tonsillectomy    . Esophagogastroduodenoscopy (egd) with propofol N/A 07/27/2015    Procedure: ESOPHAGOGASTRODUODENOSCOPY (EGD) WITH PROPOFOL;  Surgeon: Hulen Luster, MD;  Location: [facility] ENDOSCOPY;  Service: Gastroenterology;  Laterality: N/A;    Current Outpatient Rx  Name  Route  Sig  Dispense  Refill  . docusate sodium (COLACE) 100 MG capsule   Oral   Take 200 mg by mouth 2 (two) times daily as needed for mild constipation.         . fluticasone (FLONASE) 50 MCG/ACT nasal spray   Each Nare   Place 2 sprays into both nostrils daily as needed for rhinitis.          Marland Kitchen loperamide (IMODIUM) 2 MG capsule   Oral   Take 2 mg by mouth as needed for diarrhea or  loose stools.         . midodrine (PROAMATINE) 2.5 MG tablet   Oral   Take 1 tablet (2.5 mg total) by mouth 2 (two) times daily with a meal.   60 tablet   2   . morphine (MSIR) 30 MG tablet   Oral   Take 30 mg by mouth 3 (three) times daily.         Marland Kitchen OLANZapine (ZYPREXA) 5 MG tablet   Oral   Take 2.5 mg by mouth every 6 (six) hours as needed (for GI upset).          . ondansetron (ZOFRAN ODT) 4 MG disintegrating tablet   Oral   Take 1 tablet (4 mg total) by mouth every 8 (eight) hours as needed for nausea or  vomiting. Patient not taking: Reported on 07/27/2015   20 tablet   0   . Oxycodone HCl 10 MG TABS   Oral   Take 1 tablet (10 mg total) by mouth every 4 (four) hours as needed.   15 tablet   0   . promethazine (PHENERGAN) 25 MG suppository   Rectal   Place 1 suppository (25 mg total) rectally every 6 (six) hours as needed for nausea or vomiting.   12 suppository   0   . rivaroxaban (XARELTO) 20 MG TABS tablet   Oral   Take 1 tablet (20 mg total) by mouth daily with supper.   30 tablet   0   . senna (SENOKOT) 8.6 MG TABS tablet   Oral   Take 2 tablets by mouth 2 (two) times daily as needed for mild constipation.         . SUMAtriptan (IMITREX) 50 MG tablet   Oral   Take 50 mg by mouth every 2 (two) hours as needed for migraine.          . tamsulosin (FLOMAX) 0.4 MG CAPS capsule   Oral   Take 0.4 mg by mouth at bedtime.         . traZODone (DESYREL) 50 MG tablet   Oral   Take 50 mg by mouth at bedtime.         . urea (CARMOL) 20 % cream   Topical   Apply 1 application topically as needed (for dry skin on feet).            Allergies Ativan and Bee venom  Family History  Problem Relation Age of Onset  . Mesothelioma Father   . Breast cancer Mother     Social History Social History  Substance Use Topics  . Smoking status: Former Research scientist (life sciences)  . Smokeless tobacco: None  . Alcohol Use: No    Review of Systems Constitutional: No fever/chills Eyes: No visual changes. ENT: No sore throat. Cardiovascular: Denies chest pain except for pain in the upper stomach that radiates up towards his chest. Respiratory: His mild shortness of breath and wheezing with a nonproductive cough. Gastrointestinal: No abdominal pain.  Denies black or bloody stools. No diarrhea.  No constipation. Genitourinary: Negative for dysuria. Musculoskeletal: Negative for back pain. Skin: Negative for rash. Neurological: Negative for headaches, focal weakness or numbness.  10-point  ROS otherwise negative.  ____________________________________________   PHYSICAL EXAM:  VITAL SIGNS: ED Triage Vitals  Enc Vitals Group     BP 08/17/15 1425 115/63 mmHg     Pulse Rate 08/17/15 1425 84     Resp 08/17/15 1425 20     Temp 08/17/15 1425 97.7  F (36.5 C)     Temp Source 08/17/15 1425 Oral     SpO2 08/17/15 1425 94 %     Weight 08/17/15 1425 177 lb (80.287 kg)     Height 08/17/15 1425 5\' 10"  (1.778 m)     Head Cir --      Peak Flow --      Pain Score 08/17/15 1446 8     Pain Loc --      Pain Edu? --      Excl. in [place]? --    Constitutional: Alert and oriented. Somewhat chronically ill-appearing, mild dyspnea. No distress. Eyes: Conjunctivae are normal. PERRL. EOMI. Head: Atraumatic. Nose: No congestion/rhinnorhea. Mouth/Throat: Mucous membranes are lightly dry.  Oropharynx non-erythematous. Neck: No stridor.   Cardiovascular: Normal rate, regular rhythm. Grossly normal heart sounds.  Good peripheral circulation. Respiratory: Mild increased work of breathing with slight use of accessory muscles. There is mild end expiratory wheezing without rales. No tripoding or evidence of severe distress. Speaks in full sentences. Gastrointestinal: Soft and nontender. No distention. No abdominal bruits. No CVA tenderness. Musculoskeletal: No lower extremity tenderness nor edema.  No joint effusions. Neurologic:  Normal speech and language. No gross focal neurologic deficits are appreciated. Skin:  Skin is warm, dry and intact. No rash noted. Psychiatric: Mood and affect are normal. Speech and behavior are normal.  ____________________________________________   LABS (all labs ordered are listed, but only abnormal results are displayed)  Labs Reviewed  COMPREHENSIVE METABOLIC PANEL - Abnormal; Notable for the following:    Glucose, Bld 142 (*)    Calcium 8.8 (*)    AST 12 (*)    ALT 10 (*)    All other components within normal limits  CBC - Abnormal; Notable for the  following:    HCT 38.8 (*)    Platelets 146 (*)    All other components within normal limits  HEMOGLOBIN AND HEMATOCRIT, BLOOD - Abnormal; Notable for the following:    HCT 39.2 (*)    All other components within normal limits  CULTURE, BLOOD (ROUTINE X 2)  CULTURE, BLOOD (ROUTINE X 2)  LIPASE, BLOOD  TROPONIN I  POC OCCULT BLOOD, ED  TYPE AND SCREEN  ABO/RH   ____________________________________________  EKG  Reviewed and interpreted me at 1445 Ventricular rate 80 PR 100 QRS 85 QTc 4:30 Normal sinus rhythm, occasional PAC no clear ischemic abnormality  ____________________________________________  RADIOLOGY  IMPRESSION: 1. No acute findings in the abdomen or pelvis. 2. Circumferential wall thickening in the distal esophagus. This may be related to esophagitis and there is apparent reflex visible on the current study. As esophageal neoplasm can present with similar imaging features, close followup recommended. 3. Slight interval progression of patchy airspace disease in both lung bases, right greater than left. Imaging features suggest multifocal bronchopneumonia. 4. Stable 13 mm left adrenal nodule, likely adenoma. 5. Abdominal aortic atherosclerosis. ____________________________________________   PROCEDURES  Procedure(s) performed: None  Critical Care performed: No  ____________________________________________   INITIAL IMPRESSION / ASSESSMENT AND PLAN / ED COURSE  Pertinent labs & imaging results that were available during my care of the patient were reviewed by me and considered in my medical decision making (see chart for details).  Patient transfer evaluation of shortness of breath and cough. In addition he reports abdominal pain, possibly vomiting blood. Also reporting having some radiating chest pain. The patient has multiple medical complaints, however on imaging including CT does have bilateral opacifications in the lung bases with recent admission  and treatment with cephalosporin and azithromycin at the end of November. He doesn't dorsi shortness of breath, does have end expiratory wheezing all suggestive of possible COPD exacerbation, possibly secondary to an underlying pneumonia though the persistence of this without fever or leukocytosis raises question as to the true infectious etiology.  Discussed with the patient, also discussed the hospitalist team at discharge the patient recently. Based on his ongoing symptomatology and radiographic evidence of possible ongoing opacification in the lung bases we will admit the patient to hospital for further evaluation and workup. ____________________________________________   FINAL CLINICAL IMPRESSION(S) / ED DIAGNOSES  Final diagnoses:  COPD exacerbation ([place])  Bilateral pneumonia      Delman Kitten, MD 08/17/15 2028

## 2015-08-17 NOTE — H&P (Signed)
Alexander [name] at [place] NAME: Alexander [name]    MR#:  [id number]  DATE OF BIRTH:  1956/03/27  DATE OF ADMISSION:  08/17/2015  PRIMARY CARE PHYSICIAN: Perrin Maltese, MD   REQUESTING/REFERRING PHYSICIAN: Dr. Delman Kitten  CHIEF COMPLAINT:   Chief Complaint  Patient presents with  . Hematemesis    HISTORY OF PRESENT ILLNESS:  Alexander [name]  is a 59 y.o. male with a known history of COPD not on home oxygen, mantle cell lymphoma, chronic pain condition, chronic dizziness comes to ER for worsening shortness of breath and also worsening cough. Patient is very anxious appearing and is insisting that he be admitted. Chest x-ray was now very impressed with the CT of the abdomen shows that he has bibasilar infiltrates. Due to recent admission to the hospital, is being treated for HCAP. However doesn't have a white count and no fevers. His sats are 98% on room air. Has chronic pain and chronic dizziness issues. Seen by 2 neurologists in last admission. Started on midodrine for possible orthostatic hypotension. Patient also complains of mild hematemesis. Has known history of erosive esophagitis and recent EGD done. Guaiac is negative. Hemoglobin stable.  PAST MEDICAL HISTORY:   Past Medical History  Diagnosis Date  . COPD (chronic obstructive pulmonary disease) ([place])   . Hypertension   . Migraine   . Asthma   . BPH (benign prostatic hyperplasia)   . Deviated nasal septum   . DVT (deep venous thrombosis) ([place])   . Mantle cell lymphoma ([place])   . Erosive esophagitis     PAST SURGICAL HISTORY:   Past Surgical History  Procedure Laterality Date  . Throat surgery    . Barryx N/A   . Esophagogastroduodenoscopy (egd) with propofol N/A 02/23/2015    Procedure: ESOPHAGOGASTRODUODENOSCOPY (EGD) WITH PROPOFOL;  Surgeon: Hulen Luster, MD;  Location: [facility] ENDOSCOPY;  Service: Gastroenterology;  Laterality: N/A;  . Esophagogastroduodenoscopy (egd) with  propofol N/A 05/04/2015    Procedure: ESOPHAGOGASTRODUODENOSCOPY (EGD) WITH PROPOFOL;  Surgeon: Hulen Luster, MD;  Location: [facility] ENDOSCOPY;  Service: Gastroenterology;  Laterality: N/A;  . Tonsillectomy    . Esophagogastroduodenoscopy (egd) with propofol N/A 07/27/2015    Procedure: ESOPHAGOGASTRODUODENOSCOPY (EGD) WITH PROPOFOL;  Surgeon: Hulen Luster, MD;  Location: [facility] ENDOSCOPY;  Service: Gastroenterology;  Laterality: N/A;    SOCIAL HISTORY:   Social History  Substance Use Topics  . Smoking status: Former Research scientist (life sciences)  . Smokeless tobacco: Not on file  . Alcohol Use: No    FAMILY HISTORY:   Family History  Problem Relation Age of Onset  . Mesothelioma Father   . Breast cancer Mother     DRUG ALLERGIES:   Allergies  Allergen Reactions  . Ativan [Lorazepam] Other (See Comments)    Reaction:  Dizziness   . Bee Venom     REVIEW OF SYSTEMS:   Review of Systems  Constitutional: Positive for chills. Negative for fever, weight loss and malaise/fatigue.  HENT: Negative for ear discharge, ear pain, hearing loss, nosebleeds and tinnitus.   Eyes: Negative for blurred vision, double vision and photophobia.  Respiratory: Positive for cough and shortness of breath. Negative for hemoptysis and wheezing.   Cardiovascular: Positive for orthopnea. Negative for chest pain, palpitations and leg swelling.  Gastrointestinal: Positive for nausea. Negative for heartburn, vomiting, abdominal pain, diarrhea, constipation and melena.  Genitourinary: Negative for dysuria, urgency, frequency and hematuria.  Musculoskeletal: Negative for myalgias, back pain and neck pain.  Skin: Negative  for rash.  Neurological: Positive for dizziness and headaches. Negative for tingling, tremors, sensory change, speech change and focal weakness.  Endo/Heme/Allergies: Does not bruise/bleed easily.  Psychiatric/Behavioral: Negative for depression.    MEDICATIONS AT HOME:   Prior to Admission medications   Medication  Sig Start Date End Date Taking? Authorizing Provider  docusate sodium (COLACE) 100 MG capsule Take 200 mg by mouth 2 (two) times daily as needed for mild constipation.    Historical Provider, MD  fluticasone (FLONASE) 50 MCG/ACT nasal spray Place 2 sprays into both nostrils daily as needed for rhinitis.     Historical Provider, MD  loperamide (IMODIUM) 2 MG capsule Take 2 mg by mouth as needed for diarrhea or loose stools.    Historical Provider, MD  midodrine (PROAMATINE) 2.5 MG tablet Take 1 tablet (2.5 mg total) by mouth 2 (two) times daily with a meal. 08/12/15   Gladstone Lighter, MD  morphine (MSIR) 30 MG tablet Take 30 mg by mouth 3 (three) times daily.    Historical Provider, MD  OLANZapine (ZYPREXA) 5 MG tablet Take 2.5 mg by mouth every 6 (six) hours as needed (for GI upset).     Historical Provider, MD  ondansetron (ZOFRAN ODT) 4 MG disintegrating tablet Take 1 tablet (4 mg total) by mouth every 8 (eight) hours as needed for nausea or vomiting. Patient not taking: Reported on 07/27/2015 07/04/15   Harvest Dark, MD  Oxycodone HCl 10 MG TABS Take 1 tablet (10 mg total) by mouth every 4 (four) hours as needed. 08/12/15   Gladstone Lighter, MD  promethazine (PHENERGAN) 25 MG suppository Place 1 suppository (25 mg total) rectally every 6 (six) hours as needed for nausea or vomiting. 04/25/15 04/24/16  Loney Hering, MD  rivaroxaban (XARELTO) 20 MG TABS tablet Take 1 tablet (20 mg total) by mouth daily with supper. 07/18/15   Bettey Costa, MD  senna (SENOKOT) 8.6 MG TABS tablet Take 2 tablets by mouth 2 (two) times daily as needed for mild constipation.    Historical Provider, MD  SUMAtriptan (IMITREX) 50 MG tablet Take 50 mg by mouth every 2 (two) hours as needed for migraine.     Historical Provider, MD  tamsulosin (FLOMAX) 0.4 MG CAPS capsule Take 0.4 mg by mouth at bedtime.    Historical Provider, MD  traZODone (DESYREL) 50 MG tablet Take 50 mg by mouth at bedtime.    Historical Provider,  MD  urea (CARMOL) 20 % cream Apply 1 application topically as needed (for dry skin on feet).     Historical Provider, MD      VITAL SIGNS:  Blood pressure 137/70, pulse 88, temperature 97.7 F (36.5 C), temperature source Oral, resp. rate 22, height 5\' 10"  (1.778 m), weight 80.287 kg (177 lb), SpO2 100 %.  PHYSICAL EXAMINATION:   Physical Exam  GENERAL: 59 y.o.-year-old patient lying in the bed. Very anxious appearing. Wants to know is he is being admitted or not.  EYES: Pupils equal, round, reactive to light and accommodation. No scleral icterus. Extraocular muscles intact.  HEENT: Head atraumatic, normocephalic. Oropharynx and nasopharynx clear.  NECK: Supple, no jugular venous distention. No thyroid enlargement, no tenderness.  LUNGS: anxious appearing, moving air bilaterally but hyperventilating. sats 98% on room air. no wheezing, rales,rhonchi or crepitation. No use of accessory muscles of respiration.  CARDIOVASCULAR: S1, S2 normal. No murmurs, rubs, or gallops.  ABDOMEN: Soft, nontender, nondistended. Bowel sounds present. No organomegaly or mass.  EXTREMITIES: No pedal edema, cyanosis, or clubbing.  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 Muscle strength 5/5 in all extremities. Sensation intact. Gait not checked. No nystagmus PSYCHIATRIC: The patient is alert and oriented x 3.  SKIN: No obvious rash, lesion, or ulcer.   LABORATORY PANEL:   CBC  Recent Labs Lab 08/17/15 1431 08/17/15 1839  WBC 5.2  --   HGB 13.4 13.7  HCT 38.8* 39.2*  PLT 146*  --    ------------------------------------------------------------------------------------------------------------------  Chemistries   Recent Labs Lab 08/11/15 0537 08/17/15 1431  NA 139 139  K 3.7 3.5  CL 105 101  CO2 32 31  GLUCOSE 103* 142*  BUN 13 20  CREATININE 0.84 0.76  CALCIUM  8.4* 8.8*  MG 2.1  --   AST  --  12*  ALT  --  10*  ALKPHOS  --  89  BILITOT  --  0.8   ------------------------------------------------------------------------------------------------------------------  Cardiac Enzymes  Recent Labs Lab 08/10/15 2252  TROPONINI <0.03   ------------------------------------------------------------------------------------------------------------------  RADIOLOGY:  Dg Chest 1 View  08/17/2015  CLINICAL DATA:  Right rib pain in coughing for the past 2 days, following a fall. Ex-smoker. EXAM: CHEST 1 VIEW COMPARISON:  08/10/2015. FINDINGS: Normal sized heart. Clear lungs. No fracture pneumothorax. Stable right upper lobe calcified granuloma. IMPRESSION: No acute abnormality. Electronically Signed   By: Claudie Revering M.D.   On: 08/17/2015 18:14   Ct Abdomen Pelvis W Contrast  08/17/2015  CLINICAL DATA:  Initial encounter for bloody emesis with abdominal pain and weakness since this morning. EXAM: CT ABDOMEN AND PELVIS WITH CONTRAST TECHNIQUE: Multidetector CT imaging of the abdomen and pelvis was performed using the standard protocol following bolus administration of intravenous contrast. CONTRAST:  153mL OMNIPAQUE IOHEXOL 300 MG/ML  SOLN COMPARISON:  07/18/2015 FINDINGS: Lower chest: Patchy and ill-defined nodular airspace disease identified in both lower lobes assuming a peribronchovascular distribution in some regions and becoming more confluent in the deep posterior costophrenic sulci bilaterally. Circumferential wall thickening is identified in the distal esophagus and there is contrast in the distal esophagus suggesting reflux. Hepatobiliary: No focal abnormality within the liver parenchyma. There is no evidence for gallstones, gallbladder wall thickening, or pericholecystic fluid. No intrahepatic or extrahepatic biliary dilation. Pancreas: No focal mass lesion. No dilatation of the main duct. No intraparenchymal cyst. No peripancreatic edema. Spleen: No  splenomegaly. No focal mass lesion. Adrenals/Urinary Tract: Right adrenal gland normal. Stable 13 mm left adrenal nodule is described previously. Given stability back to 6/20 10/2013, this is probably a benign adrenal adenoma. 9 mm hypo attenuating lesion lower pole right kidney is too small to characterize but unchanged and probably represents a tiny cyst. Left kidney unremarkable. No evidence for hydroureter. The urinary bladder appears normal for the degree of distention. Stomach/Bowel: Stomach is nondistended. No gastric wall thickening. No evidence of outlet obstruction. Duodenum is normally positioned as is the ligament of Treitz. No small bowel wall thickening. No small bowel dilatation. The terminal ileum is normal. The appendix is normal. No gross colonic mass. No colonic wall thickening. Left colonic diverticulosis without diverticulitis. Vascular/Lymphatic: There is abdominal aortic atherosclerosis without aneurysm. There is no gastrohepatic or hepatoduodenal ligament lymphadenopathy. No intraperitoneal or retroperitoneal lymphadenopy. No pelvic sidewall lymphadenopathy. Reproductive: The prostate gland and seminal vesicles have normal imaging features. Other: No intraperitoneal free fluid. Musculoskeletal: Bone windows reveal no worrisome lytic or sclerotic osseous lesions. IMPRESSION: 1. No acute findings in the  abdomen or pelvis. 2. Circumferential wall thickening in the distal esophagus. This may be related to esophagitis and there is apparent reflex visible on the current study. As esophageal neoplasm can present with similar imaging features, close followup recommended. 3. Slight interval progression of patchy airspace disease in both lung bases, right greater than left. Imaging features suggest multifocal bronchopneumonia. 4. Stable 13 mm left adrenal nodule, likely adenoma. 5. Abdominal aortic atherosclerosis. Electronically Signed   By: Misty Stanley M.D.   On: 08/17/2015 19:57    EKG:    Orders placed or performed during the hospital encounter of 08/17/15  . EKG 12-Lead  . EKG 12-Lead  . ED EKG  . ED EKG    IMPRESSION AND PLAN:   Amanuel Mcgrue  is a 59 y.o. male with a known history of COPD not on home oxygen, mantle cell lymphoma, chronic pain condition, chronic dizziness comes to ER for worsening shortness of breath and also worsening cough.  #1 healthcare acquired pneumonia-but no fevers, not hypoxic. -CT of the chest with bibasilar multifocal pneumonia. -Follow blood cultures. Continue vancomycin and Zosyn for now. Patient is very symptomatic not sure if anxiety is playing a part. -Oxygen support as needed and monitor. -We'll start low-dose steroids with his underlying COPD and some minimal wheezing. Also do a nebs when necessary.  #2 Chronic dizziness and recent complete neurological workup-had prior work up at [facility] for the same and recently seen for the same. -Wheelchair bound at baseline. Continue midodrine for orthostatic hypotension.  #3 mantle cell lymphoma and chronic pain-follows up at [place]. -On MS Contin, oxycodone when necessary for pain. Continue pain regimen. Has pain all over at baseline.  #4 BPH-on Flomax  #5 history of DVT-on Xarelto.  #6 DVT prophylaxis-patient already on Xarelto   All the records are reviewed and case discussed with ED provider. Management plans discussed with the patient, family and they are in agreement.  CODE STATUS: Full Code  TOTAL TIME TAKING CARE OF THIS PATIENT: 55 minutes.    Gladstone Lighter M.D on 08/17/2015 at 8:49 PM  Between 7am to 6pm - Pager - [phone]  After 6pm go to [email or web address] - password [email or web address] [place] [facility]  Office  [phone]  CC: Primary care physician; Perrin Maltese, MD

## 2015-08-17 NOTE — ED Notes (Signed)
Pt c/o bloody emesis with abd pain with weakness, SOB, and dizziness since this morning . Pt c/o right lateral rib pain from a recent fall

## 2015-08-17 NOTE — ED Notes (Signed)
While in room patient states "I've been having chest pain". Patient stated that the pain started while here in the ED. Patient also states that he has been having shortness of breath and dizziness

## 2015-08-17 NOTE — ED Notes (Signed)
Patient states that he had 1 episode of vomiting with blood in it this morning.

## 2015-08-17 NOTE — Progress Notes (Signed)
ANTIBIOTIC CONSULT NOTE - INITIAL  Pharmacy Consult for Zosyn, Vancomycin  Indication: pneumonia  Allergies  Allergen Reactions  . Ativan [Lorazepam] Other (See Comments)    Reaction:  Dizziness   . Bee Venom     Patient Measurements: Height: 5\' 10"  (177.8 cm) Weight: 177 lb (80.287 kg) IBW/kg (Calculated) : 73 Adjusted Body Weight: 75.9 kg   Vital Signs: Temp: 99.4 F (37.4 C) (12/29 2219) Temp Source: Oral (12/29 2219) BP: 126/84 mmHg (12/29 2219) Pulse Rate: 84 (12/29 2219) Intake/Output from previous day:   Intake/Output from this shift: Total I/O In: -  Out: 700 [Urine:700]  Labs:  Recent Labs  08/17/15 1431 08/17/15 1839  WBC 5.2  --   HGB 13.4 13.7  PLT 146*  --   CREATININE 0.76  --    Estimated Creatinine Clearance: 100.1 mL/min (by C-G formula based on Cr of 0.76). No results for input(s): VANCOTROUGH, VANCOPEAK, VANCORANDOM, GENTTROUGH, GENTPEAK, GENTRANDOM, TOBRATROUGH, TOBRAPEAK, TOBRARND, AMIKACINPEAK, AMIKACINTROU, AMIKACIN in the last 72 hours.   Microbiology: No results found for this or any previous visit (from the past 720 hour(s)).  Medical History: Past Medical History  Diagnosis Date  . COPD (chronic obstructive pulmonary disease) ([place])   . Hypertension   . Migraine   . Asthma   . BPH (benign prostatic hyperplasia)   . Deviated nasal septum   . DVT (deep venous thrombosis) ([place])   . Mantle cell lymphoma ([place])   . Erosive esophagitis     Medications:  Prescriptions prior to admission  Medication Sig Dispense Refill Last Dose  . albuterol (PROAIR HFA) 108 (90 Base) MCG/ACT inhaler Inhale 1-2 puffs into the lungs every 4 (four) hours as needed.   prn  . docusate sodium (COLACE) 100 MG capsule Take 200 mg by mouth 2 (two) times daily as needed for mild constipation.   prn  . fluticasone (FLONASE) 50 MCG/ACT nasal spray Place 2 sprays into both nostrils daily as needed for rhinitis.    prn  . haloperidol (HALDOL) 0.5 MG tablet Take  0.5 mg by mouth 4 (four) times daily as needed.   prn  . hydrochlorothiazide (HYDRODIURIL) 12.5 MG tablet Take 12.5 mg by mouth daily.   08/17/2015 at Unknown time  . loperamide (IMODIUM) 2 MG capsule Take 2 mg by mouth as needed for diarrhea or loose stools.   prn  . midodrine (PROAMATINE) 2.5 MG tablet Take 1 tablet (2.5 mg total) by mouth 2 (two) times daily with a meal. 60 tablet 2 08/17/2015 at Unknown time  . morphine (MSIR) 30 MG tablet Take 30 mg by mouth 3 (three) times daily.   08/17/2015 at Unknown time  . OLANZapine (ZYPREXA) 5 MG tablet Take 2.5 mg by mouth every 6 (six) hours as needed (for GI upset).    prn  . Omeprazole (RA OMEPRAZOLE) 20 MG TBEC Take 20 mg by mouth 2 (two) times daily.   08/17/2015 at Unknown time  . ondansetron (ZOFRAN ODT) 4 MG disintegrating tablet Take 1 tablet (4 mg total) by mouth every 8 (eight) hours as needed for nausea or vomiting. 20 tablet 0 prn  . Oxycodone HCl 10 MG TABS Take 1 tablet (10 mg total) by mouth every 4 (four) hours as needed. 15 tablet 0 prn  . promethazine (PHENERGAN) 25 MG suppository Place 1 suppository (25 mg total) rectally every 6 (six) hours as needed for nausea or vomiting. 12 suppository 0 prn  . rivaroxaban (XARELTO) 20 MG TABS tablet Take 1 tablet (  20 mg total) by mouth daily with supper. 30 tablet 0 08/16/2015 at Unknown time  . senna (SENOKOT) 8.6 MG TABS tablet Take 2 tablets by mouth 2 (two) times daily as needed for mild constipation.   prn  . SUMAtriptan (IMITREX) 50 MG tablet Take 50 mg by mouth every 2 (two) hours as needed for migraine.    prn  . tamsulosin (FLOMAX) 0.4 MG CAPS capsule Take 0.4 mg by mouth at bedtime.   08/16/2015 at Unknown time  . traZODone (DESYREL) 50 MG tablet Take 50 mg by mouth at bedtime.   08/16/2015 at Unknown time  . urea (CARMOL) 20 % cream Apply 1 application topically as needed (for dry skin on feet).    prn   Assessment: CrCl = 100.1 ml/min Ke = 0.09 hrs-1 T1/2 = 7.7 hrs Vd = 56.2 L    No Pseudomonas risk factors noted.    Goal of Therapy:  Vancomycin trough level 15-20 mcg/ml  Plan:  Expected duration 7 days with resolution of temperature and/or normalization of WBC   Vancomycin 1250 mg IV Q12H ordered to start on 12/29 @ 23:00.  Stacked dosing is not appropriate for this pt due to excellent renal function. Will draw 1st trough on 12/31 @ 10:30, which will be at [facility].   Zosyn 3.375 gm IV Q8H EI ordered.   London Tarnowski D 08/17/2015,10:36 PM

## 2015-08-18 DIAGNOSIS — L8990 Pressure ulcer of unspecified site, unspecified stage: Secondary | ICD-10-CM | POA: Insufficient documentation

## 2015-08-18 LAB — CBC
HEMATOCRIT: 32.6 % — AB (ref 40.0–52.0)
Hemoglobin: 11.5 g/dL — ABNORMAL LOW (ref 13.0–18.0)
MCH: 30.9 pg (ref 26.0–34.0)
MCHC: 35.2 g/dL (ref 32.0–36.0)
MCV: 87.9 fL (ref 80.0–100.0)
Platelets: 122 10*3/uL — ABNORMAL LOW (ref 150–440)
RBC: 3.71 MIL/uL — AB (ref 4.40–5.90)
RDW: 13.2 % (ref 11.5–14.5)
WBC: 4.7 10*3/uL (ref 3.8–10.6)

## 2015-08-18 LAB — BASIC METABOLIC PANEL
Anion gap: 9 (ref 5–15)
BUN: 15 mg/dL (ref 6–20)
CHLORIDE: 105 mmol/L (ref 101–111)
CO2: 26 mmol/L (ref 22–32)
Calcium: 8.6 mg/dL — ABNORMAL LOW (ref 8.9–10.3)
Creatinine, Ser: 0.75 mg/dL (ref 0.61–1.24)
GFR calc non Af Amer: >60 mL/min (ref >60)
Glucose, Bld: 189 mg/dL — ABNORMAL HIGH (ref 65–99)
POTASSIUM: 3.3 mmol/L — AB (ref 3.5–5.1)
SODIUM: 140 mmol/L (ref 135–145)

## 2015-08-18 MED ORDER — PREDNISONE 50 MG PO TABS
50.0000 mg | ORAL_TABLET | Freq: Every day | ORAL | Status: DC
  Administered 2015-08-18 – 2015-08-19 (×2): 50 mg via ORAL
  Filled 2015-08-18 (×2): qty 1

## 2015-08-18 MED ORDER — ALPRAZOLAM 0.25 MG PO TABS
0.2500 mg | ORAL_TABLET | Freq: Two times a day (BID) | ORAL | Status: DC | PRN
  Administered 2015-08-18: 10:00:00 0.25 mg via ORAL
  Filled 2015-08-18: qty 1

## 2015-08-18 MED ORDER — IPRATROPIUM-ALBUTEROL 0.5-2.5 (3) MG/3ML IN SOLN
3.0000 mL | Freq: Three times a day (TID) | RESPIRATORY_TRACT | Status: DC
  Administered 2015-08-18 – 2015-08-19 (×2): 3 mL via RESPIRATORY_TRACT
  Filled 2015-08-18 (×2): qty 3

## 2015-08-18 MED ORDER — PANTOPRAZOLE SODIUM 40 MG PO TBEC
40.0000 mg | DELAYED_RELEASE_TABLET | Freq: Two times a day (BID) | ORAL | Status: DC
  Administered 2015-08-18 – 2015-08-19 (×3): 40 mg via ORAL
  Filled 2015-08-18 (×3): qty 1

## 2015-08-18 MED ORDER — IPRATROPIUM-ALBUTEROL 0.5-2.5 (3) MG/3ML IN SOLN: 3.0000 mL | RESPIRATORY_TRACT | Status: DC | PRN

## 2015-08-18 NOTE — Care Management (Signed)
Admitted to [facility] with the diagnosis of pneumonia. Discharged from this facility 08/12/15.  Lives with wife, [name] [phone] or [phone]) Last seen a physician in Dr Laurelyn Sickle office last week. Sees Dr. Joneen Caraway at [facility]. Will be seeing Palliative Care physician at [facility] next Tuesday. Also, will be arranging for Home Health in the home at that time at this appointment. Uses a cane, rolling walker, or wheelchair to aid in getting around. No skilled facility. No Home oxygen. Takes care of all basic activities of daily living himself, but wife helps with shower. Doesn't drive. Fell last Wednesday. Decreased appetite. Lost over a hundred pounds.  Does not wife to transfer to [facility] at this time. Release form signed. Sent Release form. Face sheet, and History & Physical to [facility] Shelbie Ammons RN MSN CCM Care Management [phone]

## 2015-08-18 NOTE — Progress Notes (Signed)
[place] [facility] [place] NAME: Alexander [name]    MR#:  [id number]  DATE OF BIRTH:  Mar 09, 1956  SUBJECTIVE:  CHIEF COMPLAINT:   Chief Complaint  Patient presents with  . Hematemesis   Came with cough and feeling SOB. Feels little better today.  REVIEW OF SYSTEMS:  CONSTITUTIONAL: No fever, fatigue or weakness.  EYES: No blurred or double vision.  EARS, NOSE, AND THROAT: No tinnitus or ear pain.  RESPIRATORY: No cough, shortness of breath, wheezing , had some hemoptysis.  CARDIOVASCULAR: No chest pain, orthopnea, edema.  GASTROINTESTINAL: No nausea, vomiting, diarrhea or abdominal pain.  GENITOURINARY: No dysuria, hematuria.  ENDOCRINE: No polyuria, nocturia,  HEMATOLOGY: No anemia, easy bruising or bleeding SKIN: No rash or lesion. MUSCULOSKELETAL: No joint pain or arthritis.   NEUROLOGIC: No tingling, numbness, weakness.  PSYCHIATRY: No anxiety or depression.   ROS  DRUG ALLERGIES:   Allergies  Allergen Reactions  . Ativan [Lorazepam] Other (See Comments)    Reaction:  Dizziness   . Bee Venom     VITALS:  Blood pressure 97/46, pulse 74, temperature 97.7 F (36.5 C), temperature source Oral, resp. rate 18, height 5\' 10"  (1.778 m), weight 81.738 kg (180 lb 3.2 oz), SpO2 96 %.  PHYSICAL EXAMINATION:  GENERAL:  59 y.o.-year-old patient lying in the bed with no acute distress.  EYES: Pupils equal, round, reactive to light and accommodation. No scleral icterus. Extraocular muscles intact.  HEENT: Head atraumatic, normocephalic. Oropharynx and nasopharynx clear.  NECK:  Supple, no jugular venous distention. No thyroid enlargement, no tenderness.  LUNGS: Normal breath sounds bilaterally, minimal  wheezing, no crepitation. No use of accessory muscles of respiration.  CARDIOVASCULAR: S1, S2 normal. No murmurs, rubs, or gallops.  ABDOMEN: Soft, nontender, nondistended. Bowel sounds present. No organomegaly or mass.  EXTREMITIES:  No pedal edema, cyanosis, or clubbing.  NEUROLOGIC: Cranial nerves II through XII are intact. Muscle strength 5/5 in all extremities. Sensation intact. Gait not checked.  PSYCHIATRIC: The patient is alert and oriented x 3.  SKIN: No obvious rash, lesion, or ulcer.   Physical Exam LABORATORY PANEL:   CBC  Recent Labs Lab 08/18/15 0729  WBC 4.7  HGB 11.5*  HCT 32.6*  PLT 122*   ------------------------------------------------------------------------------------------------------------------  Chemistries   Recent Labs Lab 08/17/15 1431 08/18/15 0729  NA 139 140  K 3.5 3.3*  CL 101 105  CO2 31 26  GLUCOSE 142* 189*  BUN 20 15  CREATININE 0.76 0.75  CALCIUM 8.8* 8.6*  AST 12*  --   ALT 10*  --   ALKPHOS 89  --   BILITOT 0.8  --    ------------------------------------------------------------------------------------------------------------------  Cardiac Enzymes  Recent Labs Lab 08/17/15 2027  TROPONINI <0.03   ------------------------------------------------------------------------------------------------------------------  RADIOLOGY:  Dg Chest 1 View  08/17/2015  CLINICAL DATA:  Right rib pain in coughing for the past 2 days, following a fall. Ex-smoker. EXAM: CHEST 1 VIEW COMPARISON:  08/10/2015. FINDINGS: Normal sized heart. Clear lungs. No fracture pneumothorax. Stable right upper lobe calcified granuloma. IMPRESSION: No acute abnormality. Electronically Signed   By: Claudie Revering M.D.   On: 08/17/2015 18:14   Ct Abdomen Pelvis W Contrast  08/17/2015  CLINICAL DATA:  Initial encounter for bloody emesis with abdominal pain and weakness since this morning. EXAM: CT ABDOMEN AND PELVIS WITH CONTRAST TECHNIQUE: Multidetector CT imaging of the abdomen and pelvis was performed using the standard protocol following bolus administration of intravenous contrast. CONTRAST:  167mL OMNIPAQUE IOHEXOL 300 MG/ML  SOLN COMPARISON:  07/18/2015 FINDINGS: Lower chest: Patchy and  ill-defined nodular airspace disease identified in both lower lobes assuming a peribronchovascular distribution in some regions and becoming more confluent in the deep posterior costophrenic sulci bilaterally. Circumferential wall thickening is identified in the distal esophagus and there is contrast in the distal esophagus suggesting reflux. Hepatobiliary: No focal abnormality within the liver parenchyma. There is no evidence for gallstones, gallbladder wall thickening, or pericholecystic fluid. No intrahepatic or extrahepatic biliary dilation. Pancreas: No focal mass lesion. No dilatation of the main duct. No intraparenchymal cyst. No peripancreatic edema. Spleen: No splenomegaly. No focal mass lesion. Adrenals/Urinary Tract: Right adrenal gland normal. Stable 13 mm left adrenal nodule is described previously. Given stability back to 6/20 10/2013, this is probably a benign adrenal adenoma. 9 mm hypo attenuating lesion lower pole right kidney is too small to characterize but unchanged and probably represents a tiny cyst. Left kidney unremarkable. No evidence for hydroureter. The urinary bladder appears normal for the degree of distention. Stomach/Bowel: Stomach is nondistended. No gastric wall thickening. No evidence of outlet obstruction. Duodenum is normally positioned as is the ligament of Treitz. No small bowel wall thickening. No small bowel dilatation. The terminal ileum is normal. The appendix is normal. No gross colonic mass. No colonic wall thickening. Left colonic diverticulosis without diverticulitis. Vascular/Lymphatic: There is abdominal aortic atherosclerosis without aneurysm. There is no gastrohepatic or hepatoduodenal ligament lymphadenopathy. No intraperitoneal or retroperitoneal lymphadenopy. No pelvic sidewall lymphadenopathy. Reproductive: The prostate gland and seminal vesicles have normal imaging features. Other: No intraperitoneal free fluid. Musculoskeletal: Bone windows reveal no worrisome  lytic or sclerotic osseous lesions. IMPRESSION: 1. No acute findings in the abdomen or pelvis. 2. Circumferential wall thickening in the distal esophagus. This may be related to esophagitis and there is apparent reflex visible on the current study. As esophageal neoplasm can present with similar imaging features, close followup recommended. 3. Slight interval progression of patchy airspace disease in both lung bases, right greater than left. Imaging features suggest multifocal bronchopneumonia. 4. Stable 13 mm left adrenal nodule, likely adenoma. 5. Abdominal aortic atherosclerosis. Electronically Signed   By: Misty Stanley M.D.   On: 08/17/2015 19:57    ASSESSMENT AND PLAN:   Active Problems:   HCAP (healthcare-associated pneumonia)   Pressure ulcer  #1 healthcare acquired pneumonia-but no fevers, not hypoxic. -CT of the chest with bibasilar multifocal pneumonia. -Follow blood cultures. Continue vancomycin and Zosyn for now.  -Oxygen support as needed and monitor. - low-dose steroids with his underlying COPD and some minimal wheezing. Also do a nebs when necessary. - will taper to oral Abx tomorrow.  #2 Chronic dizziness and recent complete neurological workup-had prior work up at [facility] for the same and recently seen for the same. -Wheelchair bound at baseline. Continue midodrine for orthostatic hypotension. - able to walk slow at home, have appointment at [facility] next week for PT.  #3 mantle cell lymphoma and chronic pain-follows up at [place]. -On MS Contin, oxycodone when necessary for pain. Continue pain regimen. Has pain all over at baseline.  #4 BPH-on Flomax  #5 history of DVT-on Xarelto.  #6 DVT prophylaxis-patient already on Xarelto   All the records are reviewed and case discussed with Care Management/Social Workerr. Management plans discussed with the patient, family and they are in agreement.  CODE STATUS: FUll  TOTAL TIME TAKING CARE OF THIS PATIENT: 35 minutes.    POSSIBLE D/C  IN 1-2 DAYS, DEPENDING ON CLINICAL  CONDITION.   Vaughan Basta M.D on 08/18/2015   Between 7am to 6pm - Pager - [phone]  After 6pm go to [email or web address] - password [email or web address] [place] [facility]  Office  [phone]  CC: Primary care physician; Perrin Maltese, MD  Note: This dictation was prepared with Dragon dictation along with smaller phrase technology. Any transcriptional errors that result from this process are unintentional.

## 2015-08-18 NOTE — Plan of Care (Signed)
Problem: Education: Goal: Knowledge of [place] [facility] Education information/materials will improve Outcome: Progressing Med changes today. Pt instructed and discussed  Changes.  Problem: Safety: Goal: Ability to remain free from injury will improve Outcome: Progressing No injury pt a/o and calls for assist  Problem: Pain Managment: Goal: General experience of comfort will improve Outcome: Progressing Pt on atc pain meds  Pt says  The pain med eases pain   Problem: Skin Integrity: Goal: Risk for impaired skin integrity will decrease Outcome: Progressing eccy and abraisions.  Controlled. No further breakdown

## 2015-08-18 NOTE — Progress Notes (Signed)
ANTIBIOTIC CONSULT NOTE - FOLLOW UP   Pharmacy Consult for Zosyn, Vancomycin  Indication: pneumonia  Allergies  Allergen Reactions  . Ativan [Lorazepam] Other (See Comments)    Reaction:  Dizziness   . Bee Venom     Patient Measurements: Height: 5\' 10"  (177.8 cm) Weight: 180 lb 3.2 oz (81.738 kg) IBW/kg (Calculated) : 73 Adjusted Body Weight: 75.9 kg   Vital Signs: Temp: 97.5 F (36.4 C) (12/30 0855) Temp Source: Oral (12/30 0855) BP: 110/50 mmHg (12/30 0855) Pulse Rate: 81 (12/30 0855) Intake/Output from previous day: 12/29 0701 - 12/30 0700 In: -  Out: 1300 [Urine:1300] Intake/Output from this shift:    Labs:  Recent Labs  08/17/15 1431 08/17/15 1839 08/18/15 0729  WBC 5.2  --  4.7  HGB 13.4 13.7 11.5*  PLT 146*  --  122*  CREATININE 0.76  --  0.75   Estimated Creatinine Clearance: 100.1 mL/min (by C-G formula based on Cr of 0.75). No results for input(s): VANCOTROUGH, VANCOPEAK, VANCORANDOM, GENTTROUGH, GENTPEAK, GENTRANDOM, TOBRATROUGH, TOBRAPEAK, TOBRARND, AMIKACINPEAK, AMIKACINTROU, AMIKACIN in the last 72 hours.   Microbiology: No results found for this or any previous visit (from the past 720 hour(s)).  Medical History: Past Medical History  Diagnosis Date  . COPD (chronic obstructive pulmonary disease) ([place])   . Hypertension   . Migraine   . Asthma   . BPH (benign prostatic hyperplasia)   . Deviated nasal septum   . DVT (deep venous thrombosis) ([place])   . Mantle cell lymphoma ([place])   . Erosive esophagitis     Medications:  Prescriptions prior to admission  Medication Sig Dispense Refill Last Dose  . albuterol (PROAIR HFA) 108 (90 Base) MCG/ACT inhaler Inhale 1-2 puffs into the lungs every 4 (four) hours as needed.   prn  . docusate sodium (COLACE) 100 MG capsule Take 200 mg by mouth 2 (two) times daily as needed for mild constipation.   prn  . fluticasone (FLONASE) 50 MCG/ACT nasal spray Place 2 sprays into both nostrils daily as needed for  rhinitis.    prn  . haloperidol (HALDOL) 0.5 MG tablet Take 0.5 mg by mouth 4 (four) times daily as needed.   prn  . hydrochlorothiazide (HYDRODIURIL) 12.5 MG tablet Take 12.5 mg by mouth daily.   08/17/2015 at Unknown time  . loperamide (IMODIUM) 2 MG capsule Take 2 mg by mouth as needed for diarrhea or loose stools.   prn  . midodrine (PROAMATINE) 2.5 MG tablet Take 1 tablet (2.5 mg total) by mouth 2 (two) times daily with a meal. 60 tablet 2 08/17/2015 at Unknown time  . morphine (MSIR) 30 MG tablet Take 30 mg by mouth 3 (three) times daily.   08/17/2015 at Unknown time  . OLANZapine (ZYPREXA) 5 MG tablet Take 2.5 mg by mouth every 6 (six) hours as needed (for GI upset).    prn  . Omeprazole (RA OMEPRAZOLE) 20 MG TBEC Take 20 mg by mouth 2 (two) times daily.   08/17/2015 at Unknown time  . ondansetron (ZOFRAN ODT) 4 MG disintegrating tablet Take 1 tablet (4 mg total) by mouth every 8 (eight) hours as needed for nausea or vomiting. 20 tablet 0 prn  . Oxycodone HCl 10 MG TABS Take 1 tablet (10 mg total) by mouth every 4 (four) hours as needed. 15 tablet 0 prn  . promethazine (PHENERGAN) 25 MG suppository Place 1 suppository (25 mg total) rectally every 6 (six) hours as needed for nausea or vomiting. 12 suppository  0 prn  . rivaroxaban (XARELTO) 20 MG TABS tablet Take 1 tablet (20 mg total) by mouth daily with supper. 30 tablet 0 08/16/2015 at Unknown time  . senna (SENOKOT) 8.6 MG TABS tablet Take 2 tablets by mouth 2 (two) times daily as needed for mild constipation.   prn  . SUMAtriptan (IMITREX) 50 MG tablet Take 50 mg by mouth every 2 (two) hours as needed for migraine.    prn  . tamsulosin (FLOMAX) 0.4 MG CAPS capsule Take 0.4 mg by mouth at bedtime.   08/16/2015 at Unknown time  . traZODone (DESYREL) 50 MG tablet Take 50 mg by mouth at bedtime.   08/16/2015 at Unknown time  . urea (CARMOL) 20 % cream Apply 1 application topically as needed (for dry skin on feet).    prn   Assessment: CrCl =  100.1 ml/min Ke = 0.09 hrs-1 T1/2 = 7.7 hrs Vd = 56.2 L   No Pseudomonas risk factors noted.    Goal of Therapy:  Vancomycin trough level 15-20 mcg/ml  Plan:  Expected duration 7 days with resolution of temperature and/or normalization of WBC   Will continue Vancomycin 1250 mg q12 hours. Vancomycin trough level ordered to be drawn @ 1030 on 12/31,   Zosyn 3.375 gm IV Q8H EI ordered.   Corynne Scibilia D 08/18/2015,10:13 AM

## 2015-08-18 NOTE — Progress Notes (Addendum)
Initial Nutrition Assessment  DOCUMENTATION CODES:   Severe malnutrition in context of chronic illness  INTERVENTION:   Meals and Snacks: Cater to patient preferences; will send snacks of cottage cheese as 10am snack and homemade milkshake as afternoon snack. Medical Food Supplement Therapy: will send Magic Cup TID as pt likes sherbet and does not like Ensure, Boost supplements and has tried multiple times in the past.    NUTRITION DIAGNOSIS:   Malnutrition related to chronic illness, poor appetite as evidenced by energy intake < or equal to 75% for > or equal to 1 month, severe depletion of muscle mass, moderate depletion of body fat.  GOAL:   Patient will meet greater than or equal to 90% of their needs  MONITOR:    (Energy Intake, Anthropometrics, Digestive system, Skin)  REASON FOR ASSESSMENT:    (Pressure Ulcer)    ASSESSMENT:   Pt admitted with n/v with healthcare acquired pna with recent admission on 12/22. Pt with h/o mantle cell lyphoma.   Past Medical History  Diagnosis Date  . COPD (chronic obstructive pulmonary disease) ([place])   . Hypertension   . Migraine   . Asthma   . BPH (benign prostatic hyperplasia)   . Deviated nasal septum   . DVT (deep venous thrombosis) ([place])   . Mantle cell lymphoma ([place])   . Erosive esophagitis      Diet Order:  Diet Heart Room service appropriate?: Yes; Fluid consistency:: Thin    Current Nutrition: Pt reports eating orange sherbet this am and bites of oatmeal.   Food/Nutrition-Related History: Pt reports not usually eating breakfast PTA. Pt reports poor po intake of lunch and dinner, either small portions of meals or a yogurt cup for lunch and/or dinner. Pt reports not tolerating Ensure supplements PTA. Pt reports poor appetite for the past year or more since cancer diagnosis.   Scheduled Medications:  . ipratropium-albuterol  3 mL Nebulization Q4H  . midodrine  2.5 mg Oral BID WC  . morphine  30 mg Oral 3 times per  day  . pantoprazole  40 mg Oral BID  . piperacillin-tazobactam (ZOSYN)  IV  3.375 g Intravenous 3 times per day  . predniSONE  50 mg Oral Q breakfast  . rivaroxaban  20 mg Oral Q supper  . tamsulosin  0.4 mg Oral QHS  . traZODone  50 mg Oral QHS  . vancomycin  1,250 mg Intravenous Q12H    Continuous Medications:  . sodium chloride 75 mL/hr at 08/17/15 2217     Electrolyte/Renal Profile and Glucose Profile:   Recent Labs Lab 08/17/15 1431 08/18/15 0729  NA 139 140  K 3.5 3.3*  CL 101 105  CO2 31 26  BUN 20 15  CREATININE 0.76 0.75  CALCIUM 8.8* 8.6*  GLUCOSE 142* 189*   Protein Profile:   Recent Labs Lab 08/17/15 1431  ALBUMIN 4.5    Gastrointestinal Profile: Last BM:  08/16/2015   Nutrition-Focused Physical Exam Findings: Nutrition-Focused physical exam completed. Findings are mild/moderate fat depletion, moderate-severe muscle depletion, and no edema.     Weight Change: Pt reports weight of 220lbs 1.5 years ago (18% weight loss) per [facility] encounters pt with weight of 185lbs September 2016 and has been relatively stable since.   Skin:   (Stage II sacral pressure ulcer)   Height:   Ht Readings from Last 1 Encounters:  08/17/15 5\' 10"  (1.778 m)    Weight:   Wt Readings from Last 1 Encounters:  08/17/15 180 lb 3.2  oz (81.738 kg)     Wt Readings from Last 10 Encounters:  08/17/15 180 lb 3.2 oz (81.738 kg)  08/10/15 180 lb (81.647 kg)  08/10/15 180 lb (81.647 kg)  08/09/15 180 lb (81.647 kg)  07/27/15 180 lb (81.647 kg)  07/17/15 183 lb (83.008 kg)  07/04/15 186 lb (84.369 kg)  05/18/15 185 lb 1.6 oz (83.961 kg)  05/04/15 195 lb (88.451 kg)  04/25/15 195 lb (88.451 kg)     BMI:  Body mass index is 25.86 kg/(m^2).  Estimated Nutritional Needs:   Kcal:  2319-2741kcals, BEE: 1622kcals, TEE: (IF 1.1-1.3)(AF 1.3)  Protein:  90-107g protein (1.1-1.3g/kg)  Fluid:  2050-2429mL of fluid (25-26mL/kg)  EDUCATION NEEDS:   No education needs  identified at this time   [place], RD, LDN Pager [phone] Weekend/On-Call Pager [phone]

## 2015-08-18 NOTE — Plan of Care (Signed)
Problem: Safety: Goal: Ability to remain free from injury will improve Outcome: Progressing High fall risk. Bed alarm on, hourly rounding. Safe environment provided. Pt understands how to press call button for assistance.   Problem: Pain Managment: Goal: General experience of comfort will improve Outcome: Progressing Generalized pain relieved by scheduled MS Contin. Emotional support given.   Problem: Skin Integrity: Goal: Risk for impaired skin integrity will decrease Outcome: Progressing 1cm x 1cm pressure ulcer sacrum. Cleansed, foam applied. Pt able to turn self. Skin care provided.   Problem: Activity: Goal: Risk for activity intolerance will decrease Outcome: Progressing Generalized weakness. Rest periods provided. Requires assistance to sit up one the side of the bed.

## 2015-08-19 MED ORDER — PREDNISONE 10 MG (21) PO TBPK: ORAL_TABLET | ORAL | Status: DC

## 2015-08-19 MED ORDER — AMOXICILLIN-POT CLAVULANATE 875-125 MG PO TABS: 1.0000 | ORAL_TABLET | Freq: Two times a day (BID) | ORAL | Status: DC

## 2015-08-19 MED ORDER — POTASSIUM CHLORIDE CRYS ER 20 MEQ PO TBCR
40.0000 meq | EXTENDED_RELEASE_TABLET | Freq: Two times a day (BID) | ORAL | Status: DC
  Administered 2015-08-19: 40 meq via ORAL
  Filled 2015-08-19: qty 2

## 2015-08-19 NOTE — Discharge Instructions (Signed)

## 2015-08-19 NOTE — Progress Notes (Signed)
Pt rolled out in wheelchair by staff with pt belongings in hand.

## 2015-08-19 NOTE — Progress Notes (Signed)
Pt being discharged home today, discharge instructions reviewed with pt, he verbalized understanding. Prescriptions given to pt, IV removed. Pt walked around nurse's station x1 while using a cane with nurse, pt has a steady gait, no signs of discomfort or shortness of breath noted.

## 2015-08-19 NOTE — Discharge Summary (Signed)
Alexander [name] at [place] NAME: Alexander [name]    MR#:  [id number]  DATE OF BIRTH:  Feb 17, 1966  DATE OF ADMISSION:  08/17/2015 ADMITTING PHYSICIAN: Gladstone Lighter, MD  DATE OF DISCHARGE: 08/19/2015  PRIMARY CARE PHYSICIAN: Perrin Maltese, MD    ADMISSION DIAGNOSIS:  COPD exacerbation ([place]) [J44.1] Bilateral pneumonia [J18.9]  DISCHARGE DIAGNOSIS:  Active Problems:   HCAP (healthcare-associated pneumonia)   Pressure ulcer   SECONDARY DIAGNOSIS:   Past Medical History  Diagnosis Date  . COPD (chronic obstructive pulmonary disease) ([place])   . Hypertension   . Migraine   . Asthma   . BPH (benign prostatic hyperplasia)   . Deviated nasal septum   . DVT (deep venous thrombosis) ([place])   . Mantle cell lymphoma ([place])   . Erosive esophagitis     HOSPITAL COURSE:   #1 healthcare acquired pneumonia-but no fevers, not hypoxic. -CT of the chest with bibasilar multifocal pneumonia. -Follow blood cultures. Continue vancomycin and Zosyn for now.  -Oxygen support as needed and monitor. He is stable on room air. - low-dose steroids with his underlying COPD and some minimal wheezing. Also do a nebs when necessary. - changed to oral augmentin on d/c.  #2 Chronic dizziness and recent complete neurological workup-had prior work up at [facility] for the same and recently seen for the same. -Wheelchair bound with some walking in house at baseline. Continue midodrine for orthostatic hypotension. - able to walk slow at home, have appointment at [facility] next week for PT.  #3 mantle cell lymphoma and chronic pain-follows up at [place]. -On MS Contin, oxycodone when necessary for pain. Continue pain regimen. Has pain all over at baseline.  #4 BPH-on Flomax  #5 history of DVT-on Xarelto.  #6 DVT prophylaxis-patient already on Xarelto    DISCHARGE CONDITIONS:   Stable.  CONSULTS OBTAINED:     DRUG ALLERGIES:   Allergies  Allergen Reactions  .  Ativan [Lorazepam] Other (See Comments)    Reaction:  Dizziness   . Bee Venom     DISCHARGE MEDICATIONS:   Current Discharge Medication List    START taking these medications   Details  amoxicillin-clavulanate (AUGMENTIN) 875-125 MG tablet Take 1 tablet by mouth 2 (two) times daily. Qty: 12 tablet, Refills: 0    predniSONE (STERAPRED UNI-PAK 21 TAB) 10 MG (21) TBPK tablet Take 6 tabs first day, 5 tab on day 2, then 4 on day 3rd, 3 tabs on day 4th , 2 tab on day 5th, and 1 tab on 6th day. Qty: 21 tablet, Refills: 0      CONTINUE these medications which have NOT CHANGED   Details  albuterol (PROAIR HFA) 108 (90 Base) MCG/ACT inhaler Inhale 1-2 puffs into the lungs every 4 (four) hours as needed.    docusate sodium (COLACE) 100 MG capsule Take 200 mg by mouth 2 (two) times daily as needed for mild constipation.    fluticasone (FLONASE) 50 MCG/ACT nasal spray Place 2 sprays into both nostrils daily as needed for rhinitis.     haloperidol (HALDOL) 0.5 MG tablet Take 0.5 mg by mouth 4 (four) times daily as needed.    loperamide (IMODIUM) 2 MG capsule Take 2 mg by mouth as needed for diarrhea or loose stools.    midodrine (PROAMATINE) 2.5 MG tablet Take 1 tablet (2.5 mg total) by mouth 2 (two) times daily with a meal. Qty: 60 tablet, Refills: 2    morphine (MSIR) 30 MG tablet Take  30 mg by mouth 3 (three) times daily.    OLANZapine (ZYPREXA) 5 MG tablet Take 2.5 mg by mouth every 6 (six) hours as needed (for GI upset).     Omeprazole (RA OMEPRAZOLE) 20 MG TBEC Take 20 mg by mouth 2 (two) times daily.    ondansetron (ZOFRAN ODT) 4 MG disintegrating tablet Take 1 tablet (4 mg total) by mouth every 8 (eight) hours as needed for nausea or vomiting. Qty: 20 tablet, Refills: 0    Oxycodone HCl 10 MG TABS Take 1 tablet (10 mg total) by mouth every 4 (four) hours as needed. Qty: 15 tablet, Refills: 0    promethazine (PHENERGAN) 25 MG suppository Place 1 suppository (25 mg total)  rectally every 6 (six) hours as needed for nausea or vomiting. Qty: 12 suppository, Refills: 0    rivaroxaban (XARELTO) 20 MG TABS tablet Take 1 tablet (20 mg total) by mouth daily with supper. Qty: 30 tablet, Refills: 0    senna (SENOKOT) 8.6 MG TABS tablet Take 2 tablets by mouth 2 (two) times daily as needed for mild constipation.    SUMAtriptan (IMITREX) 50 MG tablet Take 50 mg by mouth every 2 (two) hours as needed for migraine.     tamsulosin (FLOMAX) 0.4 MG CAPS capsule Take 0.4 mg by mouth at bedtime.    traZODone (DESYREL) 50 MG tablet Take 50 mg by mouth at bedtime.    urea (CARMOL) 20 % cream Apply 1 application topically as needed (for dry skin on feet).       STOP taking these medications     hydrochlorothiazide (HYDRODIURIL) 12.5 MG tablet          DISCHARGE INSTRUCTIONS:    [facility] as [facility]  If you experience worsening of your admission symptoms, develop shortness of breath, life threatening emergency, suicidal or homicidal thoughts you must seek medical attention immediately by calling 911 or calling your MD immediately  if symptoms less severe.  You Must read complete instructions/literature along with all the possible adverse reactions/side effects for all the Medicines you take and that have been prescribed to you. Take any new Medicines after you have completely understood and accept all the possible adverse reactions/side effects.   Please note  You were cared for by a hospitalist during your hospital stay. If you have any questions about your discharge medications or the care you received while you were in the hospital after you are discharged, you can call the unit and asked to speak with the hospitalist on call if the hospitalist that took care of you is not available. Once you are discharged, your primary care physician will handle any further medical issues. Please note that NO REFILLS for any discharge medications will  be authorized once you are discharged, as it is imperative that you return to your primary care physician (or establish a relationship with a primary care physician if you do not have one) for your aftercare needs so that they can reassess your need for medications and monitor your lab values.    Today   CHIEF COMPLAINT:   Chief Complaint  Patient presents with  . Hematemesis    HISTORY OF PRESENT ILLNESS:  Alexander [name]  is a 59 y.o. male with a known history of COPD not on home oxygen, mantle cell lymphoma, chronic pain condition, chronic dizziness comes to ER for worsening shortness of breath and also worsening cough. Patient is very anxious appearing and is insisting  that he be admitted. Chest x-ray was now very impressed with the CT of the abdomen shows that he has bibasilar infiltrates. Due to recent admission to the hospital, is being treated for HCAP. However doesn't have a white count and no fevers. His sats are 98% on room air. Has chronic pain and chronic dizziness issues. Seen by 2 neurologists in last admission. Started on midodrine for possible orthostatic hypotension. Patient also complains of mild hematemesis. Has known history of erosive esophagitis and recent EGD done. Guaiac is negative. Hemoglobin stable.  VITAL SIGNS:  Blood pressure 131/56, pulse 76, temperature 98 F (36.7 C), temperature source Oral, resp. rate 18, height 5\' 10"  (1.778 m), weight 81.738 kg (180 lb 3.2 oz), SpO2 99 %.  I/O:   Intake/Output Summary (Last 24 hours) at 08/19/15 0941 Last data filed at 08/19/15 0700  Gross per 24 hour  Intake    740 ml  Output      0 ml  Net    740 ml    PHYSICAL EXAMINATION:   GENERAL: 59 y.o.-year-old patient lying in the bed with no acute distress.  EYES: Pupils equal, round, reactive to light and accommodation. No scleral icterus. Extraocular muscles intact.  HEENT: Head atraumatic, normocephalic. Oropharynx and nasopharynx clear.  NECK: Supple, no  jugular venous distention. No thyroid enlargement, no tenderness.  LUNGS: Normal breath sounds bilaterally, minimal wheezing, no crepitation. No use of accessory muscles of respiration.  CARDIOVASCULAR: S1, S2 normal. No murmurs, rubs, or gallops.  ABDOMEN: Soft, nontender, nondistended. Bowel sounds present. No organomegaly or mass.  EXTREMITIES: No pedal edema, cyanosis, or clubbing.  NEUROLOGIC: Cranial nerves II through XII are intact. Muscle strength 5/5 in all extremities. Sensation intact. Gait not checked.  PSYCHIATRIC: The patient is alert and oriented x 3.  SKIN: No obvious rash, lesion, or ulcer.   DATA REVIEW:   CBC  Recent Labs Lab 08/18/15 0729  WBC 4.7  HGB 11.5*  HCT 32.6*  PLT 122*    Chemistries   Recent Labs Lab 08/17/15 1431 08/18/15 0729  NA 139 140  K 3.5 3.3*  CL 101 105  CO2 31 26  GLUCOSE 142* 189*  BUN 20 15  CREATININE 0.76 0.75  CALCIUM 8.8* 8.6*  AST 12*  --   ALT 10*  --   ALKPHOS 89  --   BILITOT 0.8  --     Cardiac Enzymes  Recent Labs Lab 08/17/15 2027  TROPONINI <0.03    Microbiology Results  Results for orders placed or performed during the hospital encounter of 07/16/15  C difficile quick scan w PCR reflex     Status: None   Collection Time: 07/18/15  5:30 PM  Result Value Ref Range Status   C Diff antigen NEGATIVE NEGATIVE Final   C Diff toxin NEGATIVE NEGATIVE Final   C Diff interpretation Negative for C. difficile  Final    RADIOLOGY:  Dg Chest 1 View  08/17/2015  CLINICAL DATA:  Right rib pain in coughing for the past 2 days, following a fall. Ex-smoker. EXAM: CHEST 1 VIEW COMPARISON:  08/10/2015. FINDINGS: Normal sized heart. Clear lungs. No fracture pneumothorax. Stable right upper lobe calcified granuloma. IMPRESSION: No acute abnormality. Electronically Signed   By: Claudie Revering M.D.   On: 08/17/2015 18:14   Ct Abdomen Pelvis W Contrast  08/17/2015  CLINICAL DATA:  Initial encounter for bloody  emesis with abdominal pain and weakness since this morning. EXAM: CT ABDOMEN AND PELVIS WITH CONTRAST TECHNIQUE:  Multidetector CT imaging of the abdomen and pelvis was performed using the standard protocol following bolus administration of intravenous contrast. CONTRAST:  16mL OMNIPAQUE IOHEXOL 300 MG/ML  SOLN COMPARISON:  07/18/2015 FINDINGS: Lower chest: Patchy and ill-defined nodular airspace disease identified in both lower lobes assuming a peribronchovascular distribution in some regions and becoming more confluent in the deep posterior costophrenic sulci bilaterally. Circumferential wall thickening is identified in the distal esophagus and there is contrast in the distal esophagus suggesting reflux. Hepatobiliary: No focal abnormality within the liver parenchyma. There is no evidence for gallstones, gallbladder wall thickening, or pericholecystic fluid. No intrahepatic or extrahepatic biliary dilation. Pancreas: No focal mass lesion. No dilatation of the main duct. No intraparenchymal cyst. No peripancreatic edema. Spleen: No splenomegaly. No focal mass lesion. Adrenals/Urinary Tract: Right adrenal gland normal. Stable 13 mm left adrenal nodule is described previously. Given stability back to 6/20 10/2013, this is probably a benign adrenal adenoma. 9 mm hypo attenuating lesion lower pole right kidney is too small to characterize but unchanged and probably represents a tiny cyst. Left kidney unremarkable. No evidence for hydroureter. The urinary bladder appears normal for the degree of distention. Stomach/Bowel: Stomach is nondistended. No gastric wall thickening. No evidence of outlet obstruction. Duodenum is normally positioned as is the ligament of Treitz. No small bowel wall thickening. No small bowel dilatation. The terminal ileum is normal. The appendix is normal. No gross colonic mass. No colonic wall thickening. Left colonic diverticulosis without diverticulitis. Vascular/Lymphatic: There is abdominal  aortic atherosclerosis without aneurysm. There is no gastrohepatic or hepatoduodenal ligament lymphadenopathy. No intraperitoneal or retroperitoneal lymphadenopy. No pelvic sidewall lymphadenopathy. Reproductive: The prostate gland and seminal vesicles have normal imaging features. Other: No intraperitoneal free fluid. Musculoskeletal: Bone windows reveal no worrisome lytic or sclerotic osseous lesions. IMPRESSION: 1. No acute findings in the abdomen or pelvis. 2. Circumferential wall thickening in the distal esophagus. This may be related to esophagitis and there is apparent reflex visible on the current study. As esophageal neoplasm can present with similar imaging features, close followup recommended. 3. Slight interval progression of patchy airspace disease in both lung bases, right greater than left. Imaging features suggest multifocal bronchopneumonia. 4. Stable 13 mm left adrenal nodule, likely adenoma. 5. Abdominal aortic atherosclerosis. Electronically Signed   By: Misty Stanley M.D.   On: 08/17/2015 19:57    EKG:   Orders placed or performed during the hospital encounter of 08/17/15  . EKG 12-Lead  . EKG 12-Lead  . ED EKG  . ED EKG      Management plans discussed with the patient, family and they are in agreement.  CODE STATUS:     Code Status Orders        Start     Ordered   08/17/15 2204  Full code   Continuous     08/17/15 2203    Advance Directive Documentation        Most Recent Value   Type of Advance Directive  Healthcare Power of [place], Living will [name] Felisa Bonier and Marily Memos ]   Pre-existing out of facility DNR order (yellow form or pink MOST form)     "MOST" Form in Place?        TOTAL TIME TAKING CARE OF THIS PATIENT: 35 minutes.    Vaughan Basta M.D on 08/19/2015 at 9:41 AM  Between 7am to 6pm - Pager - [phone]  After 6pm go to [email or web address] - password Child psychotherapist [facility]  Office  [phone]  CC: Primary care physician; Perrin Maltese, MD   Note: This dictation was prepared with Dragon dictation along with smaller phrase technology. Any transcriptional errors that result from this process are unintentional.

## 2015-08-22 LAB — CULTURE, BLOOD (ROUTINE X 2)
CULTURE: NO GROWTH
Culture: NO GROWTH

## 2015-08-22 NOTE — ED Provider Notes (Signed)
[facility] Emergency Department Provider Note  ____________________________________________  Time seen: 2:30 AM  I have reviewed the triage vital signs and the nursing notes.   HISTORY  Chief Complaint Loss of Consciousness      HPI Alexander [name] is a 60 y.o. male presents with history of syncopal episode. Patient states he "got out of bed and then became acutely dizzy followed by a syncopal episode. Patient complains of 7 out of 10 right sided rib cage pain. Patient denies any dyspnea no nausea or vomiting     Past Medical History  Diagnosis Date  . COPD (chronic obstructive pulmonary disease) ([place])   . Hypertension   . Migraine   . Asthma   . BPH (benign prostatic hyperplasia)   . Deviated nasal septum   . DVT (deep venous thrombosis) ([place])   . Mantle cell lymphoma ([place])   . Erosive esophagitis     Patient Active Problem List   Diagnosis Date Noted  . Pressure ulcer 08/18/2015  . HCAP (healthcare-associated pneumonia) 08/17/2015  . Syncope and collapse 08/10/2015  . Syncope 08/10/2015  . Protein-calorie malnutrition, severe 08/10/2015  . Abdominal pain 07/17/2015  . Nausea & vomiting 07/17/2015  . Malnutrition of moderate degree 07/17/2015  . Diarrhea 05/18/2015  . DEVIATED SEPTUM 05/18/2007  . ALLERGIC RHINITIS 05/18/2007  . ASTHMA 05/18/2007    Past Surgical History  Procedure Laterality Date  . Throat surgery    . Barryx N/A   . Esophagogastroduodenoscopy (egd) with propofol N/A 02/23/2015    Procedure: ESOPHAGOGASTRODUODENOSCOPY (EGD) WITH PROPOFOL;  Surgeon: Hulen Luster, MD;  Location[facility] ENDOSCOPY;  Service: Gastroenterology;  Laterality: N/A;  . Esophagogastroduodenoscopy (egd) with propofol N/A 05/04/2015    Procedure: ESOPHAGOGASTRODUODENOSCOPY (EGD) WITH PROPOFOL;  Surgeon: Hulen Luster, MD;  Location: [facility] ENDOSCOPY;  Service: Gastroenterology;  Laterality: N/A;  . Tonsillectomy    . Esophagogastroduodenoscopy (egd) with  propofol N/A 07/27/2015    Procedure: ESOPHAGOGASTRODUODENOSCOPY (EGD) WITH PROPOFOL;  Surgeon: Hulen Luster, MD;  Location: [facility] ENDOSCOPY;  Service: Gastroenterology;  Laterality: N/A;    Current Outpatient Rx  Name  Route  Sig  Dispense  Refill  . docusate sodium (COLACE) 100 MG capsule   Oral   Take 200 mg by mouth 2 (two) times daily as needed for mild constipation.         . fluticasone (FLONASE) 50 MCG/ACT nasal spray   Each Nare   Place 2 sprays into both nostrils daily as needed for rhinitis.          Marland Kitchen loperamide (IMODIUM) 2 MG capsule   Oral   Take 2 mg by mouth as needed for diarrhea or loose stools.         . promethazine (PHENERGAN) 25 MG suppository   Rectal   Place 1 suppository (25 mg total) rectally every 6 (six) hours as needed for nausea or vomiting.   12 suppository   0   . rivaroxaban (XARELTO) 20 MG TABS tablet   Oral   Take 1 tablet (20 mg total) by mouth daily with supper.   30 tablet   0   . albuterol (PROAIR HFA) 108 (90 Base) MCG/ACT inhaler   Inhalation   Inhale 1-2 puffs into the lungs every 4 (four) hours as needed.         Marland Kitchen amoxicillin-clavulanate (AUGMENTIN) 875-125 MG tablet   Oral   Take 1 tablet by mouth 2 (two) times daily.   12 tablet   0   .  haloperidol (HALDOL) 0.5 MG tablet   Oral   Take 0.5 mg by mouth 4 (four) times daily as needed.         . midodrine (PROAMATINE) 2.5 MG tablet   Oral   Take 1 tablet (2.5 mg total) by mouth 2 (two) times daily with a meal.   60 tablet   2   . morphine (MSIR) 30 MG tablet   Oral   Take 30 mg by mouth 3 (three) times daily.         Marland Kitchen OLANZapine (ZYPREXA) 5 MG tablet   Oral   Take 2.5 mg by mouth every 6 (six) hours as needed (for GI upset).          . Omeprazole (RA OMEPRAZOLE) 20 MG TBEC   Oral   Take 20 mg by mouth 2 (two) times daily.         . ondansetron (ZOFRAN ODT) 4 MG disintegrating tablet   Oral   Take 1 tablet (4 mg total) by mouth every 8 (eight) hours  as needed for nausea or vomiting.   20 tablet   0   . Oxycodone HCl 10 MG TABS   Oral   Take 1 tablet (10 mg total) by mouth every 4 (four) hours as needed.   15 tablet   0   . predniSONE (STERAPRED UNI-PAK 21 TAB) 10 MG (21) TBPK tablet      Take 6 tabs first day, 5 tab on day 2, then 4 on day 3rd, 3 tabs on day 4th , 2 tab on day 5th, and 1 tab on 6th day.   21 tablet   0   . senna (SENOKOT) 8.6 MG TABS tablet   Oral   Take 2 tablets by mouth 2 (two) times daily as needed for mild constipation.         . SUMAtriptan (IMITREX) 50 MG tablet   Oral   Take 50 mg by mouth every 2 (two) hours as needed for migraine.          . tamsulosin (FLOMAX) 0.4 MG CAPS capsule   Oral   Take 0.4 mg by mouth at bedtime.         . traZODone (DESYREL) 50 MG tablet   Oral   Take 50 mg by mouth at bedtime.         . urea (CARMOL) 20 % cream   Topical   Apply 1 application topically as needed (for dry skin on feet).            Allergies Ativan and Bee venom  Family History  Problem Relation Age of Onset  . Mesothelioma Father   . Breast cancer Mother     Social History Social History  Substance Use Topics  . Smoking status: Former Research scientist (life sciences)  . Smokeless tobacco: None  . Alcohol Use: No    Review of Systems  Constitutional: Negative for fever. Eyes: Negative for visual changes. ENT: Negative for sore throat. Cardiovascular: Positive for chest pain. Respiratory: Negative for shortness of breath. Gastrointestinal: Negative for abdominal pain, vomiting and diarrhea. Genitourinary: Negative for dysuria. Musculoskeletal: Negative for back pain. Skin: Negative for rash. Neurological: Negative for headaches, focal weakness or numbness.  10-point ROS otherwise negative.  ____________________________________________   PHYSICAL EXAM:  VITAL SIGNS: ED Triage Vitals  Enc Vitals Group     BP 08/10/15 0131 104/64 mmHg     Pulse Rate 08/10/15 0131 75     Resp 08/10/15  0131  18     Temp 08/10/15 0131 98 F (36.7 C)     Temp Source 08/10/15 0131 Oral     SpO2 08/10/15 0131 100 %     Weight 08/10/15 0131 180 lb (81.647 kg)     Height 08/10/15 0131 5\' 10"  (1.778 m)     Head Cir --      Peak Flow --      Pain Score 08/10/15 0131 9     Pain Loc --      Pain Edu? --      Excl. in [place]? --      Constitutional: Alert and oriented. Well appearing and in no distress. Eyes: Conjunctivae are normal. PERRL. Normal extraocular movements. ENT   Head: Normocephalic and atraumatic.   Nose: No congestion/rhinnorhea.   Mouth/Throat: Mucous membranes are moist.   Neck: No stridor. Hematological/Lymphatic/Immunilogical: No cervical lymphadenopathy. Cardiovascular: Normal rate, regular rhythm. Normal and symmetric distal pulses are present in all extremities. No murmurs, rubs, or gallops. Respiratory: Normal respiratory effort without tachypnea nor retractions. Breath sounds are clear and equal bilaterally. No wheezes/rales/rhonchi. Gastrointestinal: Soft and nontender. No distention. There is no CVA tenderness. Genitourinary: deferred Musculoskeletal: Nontender with normal range of motion in all extremities. No joint effusions.  No lower extremity tenderness nor edema. Neurologic:  Normal speech and language. No gross focal neurologic deficits are appreciated. Speech is normal.  Skin:  Skin is warm, dry and intact. No rash noted. Psychiatric: Mood and affect are normal. Speech and behavior are normal. Patient exhibits appropriate insight and judgment.  ____________________________________________    LABS (pertinent positives/negatives) Labs Reviewed  BASIC METABOLIC PANEL - Abnormal; Notable for the following:    Sodium 132 (*)    Potassium 3.2 (*)    Chloride 100 (*)    Glucose, Bld 106 (*)    Calcium 8.4 (*)    All other components within normal limits  CBC - Abnormal; Notable for the following:    Platelets 142 (*)    All other components  within normal limits  BASIC METABOLIC PANEL - Abnormal; Notable for the following:    Glucose, Bld 103 (*)    Calcium 8.4 (*)    Anion gap 2 (*)    All other components within normal limits  CBC - Abnormal; Notable for the following:    RBC 4.20 (*)    Hemoglobin 12.8 (*)    HCT 37.1 (*)    Platelets 123 (*)    All other components within normal limits  TROPONIN I  TROPONIN I  TROPONIN I  TROPONIN I  MAGNESIUM     ____________________________________________    RADIOLOGY     DG Chest 2 View (Final result) Result time: 08/10/15 02:33:32   Final result by Rad Results In Interface (08/10/15 02:33:32)   Narrative:   CLINICAL DATA: Fall with right rib pain  EXAM: CHEST 2 VIEW  COMPARISON: 08/09/2015  FINDINGS: Normal heart size and mediastinal contours. Calcified granuloma in the right upper lobe. No acute infiltrate or edema. No effusion or pneumothorax. No acute osseous findings.  IMPRESSION: No acute finding.   Electronically Signed By: Monte Fantasia M.D. On: 08/10/2015 02:33          INITIAL IMPRESSION / ASSESSMENT AND PLAN / ED COURSE  Pertinent labs & imaging results that were available during my care of the patient were reviewed by me and considered in my medical decision making (see chart for details).   ____________________________________________   FINAL CLINICAL IMPRESSION(S) /  ED DIAGNOSES  Final diagnoses:  Syncope      Gregor Hams, MD 08/22/15 [phone]

## 2015-09-20 ENCOUNTER — Emergency Department: Payer: Medicaid Other

## 2015-09-20 ENCOUNTER — Emergency Department
Admission: EM | Admit: 2015-09-20 | Discharge: 2015-09-20 | Disposition: A | Discharge status: Home / Self Care | Payer: Medicaid Other | Attending: Student | Admitting: Student

## 2015-09-20 ENCOUNTER — Encounter: Payer: Self-pay | Admitting: Emergency Medicine

## 2015-09-20 DIAGNOSIS — Z87891 Personal history of nicotine dependence: Secondary | ICD-10-CM | POA: Diagnosis not present

## 2015-09-20 DIAGNOSIS — Z79899 Other long term (current) drug therapy: Secondary | ICD-10-CM | POA: Diagnosis not present

## 2015-09-20 DIAGNOSIS — J441 Chronic obstructive pulmonary disease with (acute) exacerbation: Secondary | ICD-10-CM | POA: Diagnosis not present

## 2015-09-20 DIAGNOSIS — R079 Chest pain, unspecified: Secondary | ICD-10-CM

## 2015-09-20 DIAGNOSIS — Z79891 Long term (current) use of opiate analgesic: Secondary | ICD-10-CM | POA: Insufficient documentation

## 2015-09-20 DIAGNOSIS — R1013 Epigastric pain: Secondary | ICD-10-CM | POA: Insufficient documentation

## 2015-09-20 DIAGNOSIS — R55 Syncope and collapse: Secondary | ICD-10-CM | POA: Diagnosis not present

## 2015-09-20 DIAGNOSIS — R110 Nausea: Secondary | ICD-10-CM | POA: Insufficient documentation

## 2015-09-20 DIAGNOSIS — Z792 Long term (current) use of antibiotics: Secondary | ICD-10-CM | POA: Insufficient documentation

## 2015-09-20 DIAGNOSIS — G8929 Other chronic pain: Secondary | ICD-10-CM | POA: Diagnosis not present

## 2015-09-20 DIAGNOSIS — R109 Unspecified abdominal pain: Secondary | ICD-10-CM

## 2015-09-20 LAB — BASIC METABOLIC PANEL
ANION GAP: 7 (ref 5–15)
BUN: 18 mg/dL (ref 6–20)
CHLORIDE: 102 mmol/L (ref 101–111)
CO2: 29 mmol/L (ref 22–32)
Calcium: 9.5 mg/dL (ref 8.9–10.3)
Creatinine, Ser: 0.71 mg/dL (ref 0.61–1.24)
GFR calc non Af Amer: >60 mL/min (ref >60)
Glucose, Bld: 120 mg/dL — ABNORMAL HIGH (ref 65–99)
Potassium: 3.5 mmol/L (ref 3.5–5.1)
SODIUM: 138 mmol/L (ref 135–145)

## 2015-09-20 LAB — HEPATIC FUNCTION PANEL
ALBUMIN: 4.5 g/dL (ref 3.5–5.0)
ALT: 12 U/L — AB (ref 17–63)
AST: 16 U/L (ref 15–41)
Alkaline Phosphatase: 102 U/L (ref 38–126)
BILIRUBIN DIRECT: 0.2 mg/dL (ref 0.1–0.5)
BILIRUBIN TOTAL: 1.4 mg/dL — AB (ref 0.3–1.2)
Indirect Bilirubin: 1.2 mg/dL — ABNORMAL HIGH (ref 0.3–0.9)
Total Protein: 7.2 g/dL (ref 6.5–8.1)

## 2015-09-20 LAB — CBC
HCT: 44 % (ref 40.0–52.0)
Hemoglobin: 15.3 g/dL (ref 13.0–18.0)
MCH: 29.6 pg (ref 26.0–34.0)
MCHC: 34.9 g/dL (ref 32.0–36.0)
MCV: 84.8 fL (ref 80.0–100.0)
Platelets: 174 K/uL (ref 150–440)
RBC: 5.18 MIL/uL (ref 4.40–5.90)
RDW: 13.6 % (ref 11.5–14.5)
WBC: 6 K/uL (ref 3.8–10.6)

## 2015-09-20 LAB — LIPASE, BLOOD: Lipase: 15 U/L (ref 11–51)

## 2015-09-20 LAB — TROPONIN I: Troponin I: <0.03 ng/mL

## 2015-09-20 MED ORDER — SODIUM CHLORIDE 0.9 % IV BOLUS (SEPSIS)
1000.0000 mL | Freq: Once | INTRAVENOUS | Status: AC
  Administered 2015-09-20: 1000 mL via INTRAVENOUS

## 2015-09-20 MED ORDER — PROMETHAZINE HCL 25 MG/ML IJ SOLN
12.5000 mg | Freq: Once | INTRAMUSCULAR | Status: AC
  Administered 2015-09-20: 12.5 mg via INTRAVENOUS
  Filled 2015-09-20: qty 1

## 2015-09-20 MED ORDER — IOHEXOL 350 MG/ML SOLN
100.0000 mL | Freq: Once | INTRAVENOUS | Status: AC | PRN
  Administered 2015-09-20: 100 mL via INTRAVENOUS

## 2015-09-20 MED ORDER — HYDROMORPHONE HCL 1 MG/ML IJ SOLN
1.0000 mg | Freq: Once | INTRAMUSCULAR | Status: AC
Start: 2015-09-20 — End: 2015-09-20
  Administered 2015-09-20: 1 mg via INTRAVENOUS
  Filled 2015-09-20: qty 1

## 2015-09-20 MED ORDER — ASPIRIN 81 MG PO CHEW
324.0000 mg | CHEWABLE_TABLET | Freq: Once | ORAL | Status: AC
  Administered 2015-09-20: 324 mg via ORAL
  Filled 2015-09-20: qty 4

## 2015-09-20 NOTE — ED Notes (Signed)
Pt to ed with c/o intermittent chest pain for the last 2 days.  Pt states sob, weakness and dizziness associated with the chest pain.

## 2015-09-20 NOTE — ED Provider Notes (Signed)
[facility] Emergency Department Provider Note  ____________________________________________  Time seen: Approximately 4:48 PM  I have reviewed the triage vital signs and the nursing notes.   HISTORY  Chief Complaint Chest Pain    HPI Alexander [name] is a 60 y.o. male with COPD not on home oxygen, mantle cell lymphoma, chronic pain condition, chronic dizziness and history of syncopal episodes thought to be secondary to orthostatic hypotension on Midodrine who presents for evaluation of his typical  abdominal pain as well as 2 days of intermittent left-sided chest soreness, gradual onset, intermittent, currently moderate, no modifying factors. The patient reports that the pain in his left chest is associated with some shortness of breath, it is pleuritic, and it sometimes radiates into the left shoulder. It is not ripping and tearing in nature does not radiate towards the back or down towards the feet. It is not associated with any sudden sweating or diaphoresis. The patient did have one of his typical syncopal episodes today. He reports he was walking back from the bathroom when he started feeling lightheaded and like "everything was going black". His wife caught him and lowered him to the ground. He is complaining of his chronic abdominal pain which is worse in the epigastrium. He has had nausea but no vomiting or diarrhea or fevers or chills. No dysuria or hematuria.     Past Medical History  Diagnosis Date  . COPD (chronic obstructive pulmonary disease) ([place])   . Migraine   . Asthma   . BPH (benign prostatic hyperplasia)   . Deviated nasal septum   . DVT (deep venous thrombosis) ([place])   . Mantle cell lymphoma ([place])   . Erosive esophagitis     Patient Active Problem List   Diagnosis Date Noted  . Pressure ulcer 08/18/2015  . HCAP (healthcare-associated pneumonia) 08/17/2015  . Syncope and collapse 08/10/2015  . Syncope 08/10/2015  . Protein-calorie  malnutrition, severe 08/10/2015  . Abdominal pain 07/17/2015  . Nausea & vomiting 07/17/2015  . Malnutrition of moderate degree 07/17/2015  . Diarrhea 05/18/2015  . DEVIATED SEPTUM 05/18/2007  . ALLERGIC RHINITIS 05/18/2007  . ASTHMA 05/18/2007    Past Surgical History  Procedure Laterality Date  . Throat surgery    . Barryx N/A   . Esophagogastroduodenoscopy (egd) with propofol N/A 02/23/2015    Procedure: ESOPHAGOGASTRODUODENOSCOPY (EGD) WITH PROPOFOL;  Surgeon: Hulen Luster, MD;  Location: [facility] ENDOSCOPY;  Service: Gastroenterology;  Laterality: N/A;  . Esophagogastroduodenoscopy (egd) with propofol N/A 05/04/2015    Procedure: ESOPHAGOGASTRODUODENOSCOPY (EGD) WITH PROPOFOL;  Surgeon: Hulen Luster, MD;  Location: [facility] ENDOSCOPY;  Service: Gastroenterology;  Laterality: N/A;  . Tonsillectomy    . Esophagogastroduodenoscopy (egd) with propofol N/A 07/27/2015    Procedure: ESOPHAGOGASTRODUODENOSCOPY (EGD) WITH PROPOFOL;  Surgeon: Hulen Luster, MD;  Location: [facility] ENDOSCOPY;  Service: Gastroenterology;  Laterality: N/A;    Current Outpatient Rx  Name  Route  Sig  Dispense  Refill  . albuterol (PROAIR HFA) 108 (90 Base) MCG/ACT inhaler   Inhalation   Inhale 1-2 puffs into the lungs every 4 (four) hours as needed.         Marland Kitchen amoxicillin-clavulanate (AUGMENTIN) 875-125 MG tablet   Oral   Take 1 tablet by mouth 2 (two) times daily.   12 tablet   0   . docusate sodium (COLACE) 100 MG capsule   Oral   Take 200 mg by mouth 2 (two) times daily as needed for mild constipation.         Marland Kitchen  fluticasone (FLONASE) 50 MCG/ACT nasal spray   Each Nare   Place 2 sprays into both nostrils daily as needed for rhinitis.          . haloperidol (HALDOL) 0.5 MG tablet   Oral   Take 0.5 mg by mouth 4 (four) times daily as needed.         . loperamide (IMODIUM) 2 MG capsule   Oral   Take 2 mg by mouth as needed for diarrhea or loose stools.         . midodrine (PROAMATINE) 2.5 MG tablet   Oral    Take 1 tablet (2.5 mg total) by mouth 2 (two) times daily with a meal.   60 tablet   2   . morphine (MSIR) 30 MG tablet   Oral   Take 30 mg by mouth 3 (three) times daily.         Marland Kitchen OLANZapine (ZYPREXA) 5 MG tablet   Oral   Take 2.5 mg by mouth every 6 (six) hours as needed (for GI upset).          . Omeprazole (RA OMEPRAZOLE) 20 MG TBEC   Oral   Take 20 mg by mouth 2 (two) times daily.         . ondansetron (ZOFRAN ODT) 4 MG disintegrating tablet   Oral   Take 1 tablet (4 mg total) by mouth every 8 (eight) hours as needed for nausea or vomiting.   20 tablet   0   . Oxycodone HCl 10 MG TABS   Oral   Take 1 tablet (10 mg total) by mouth every 4 (four) hours as needed.   15 tablet   0   . predniSONE (STERAPRED UNI-PAK 21 TAB) 10 MG (21) TBPK tablet      Take 6 tabs first day, 5 tab on day 2, then 4 on day 3rd, 3 tabs on day 4th , 2 tab on day 5th, and 1 tab on 6th day.   21 tablet   0   . promethazine (PHENERGAN) 25 MG suppository   Rectal   Place 1 suppository (25 mg total) rectally every 6 (six) hours as needed for nausea or vomiting.   12 suppository   0   . rivaroxaban (XARELTO) 20 MG TABS tablet   Oral   Take 1 tablet (20 mg total) by mouth daily with supper.   30 tablet   0   . senna (SENOKOT) 8.6 MG TABS tablet   Oral   Take 2 tablets by mouth 2 (two) times daily as needed for mild constipation.         . SUMAtriptan (IMITREX) 50 MG tablet   Oral   Take 50 mg by mouth every 2 (two) hours as needed for migraine.          . tamsulosin (FLOMAX) 0.4 MG CAPS capsule   Oral   Take 0.4 mg by mouth at bedtime.         . traZODone (DESYREL) 50 MG tablet   Oral   Take 50 mg by mouth at bedtime.         . urea (CARMOL) 20 % cream   Topical   Apply 1 application topically as needed (for dry skin on feet).            Allergies Ativan and Bee venom  Family History  Problem Relation Age of Onset  . Mesothelioma Father   . Breast cancer  Mother  Social History Social History  Substance Use Topics  . Smoking status: Former Research scientist (life sciences)  . Smokeless tobacco: None  . Alcohol Use: No    Review of Systems Constitutional: No fever/chills Eyes: No visual changes. ENT: No sore throat. Cardiovascular: + chest pain. Respiratory: + shortness of breath. Gastrointestinal: + abdominal pain.  + nausea, no vomiting.  No diarrhea.  No constipation. Genitourinary: Negative for dysuria. Musculoskeletal: Negative for back pain. Skin: Negative for rash. Neurological: Negative for headaches, focal weakness or numbness.  10-point ROS otherwise negative.  ____________________________________________   PHYSICAL EXAM:  VITAL SIGNS: ED Triage Vitals  Enc Vitals Group     BP 09/20/15 1619 138/91 mmHg     Pulse Rate 09/20/15 1619 74     Resp 09/20/15 1619 16     Temp 09/20/15 1619 98.5 F (36.9 C)     Temp Source 09/20/15 1619 Oral     SpO2 09/20/15 1619 100 %     Weight --      Height --      Head Cir --      Peak Flow --      Pain Score 09/20/15 1258 6     Pain Loc --      Pain Edu? --      Excl. in [place]? --     Constitutional: Alert and oriented. Nontoxic-appearing and in no acute distress. Eyes: Conjunctivae are normal. PERRL. EOMI. Head: Atraumatic. Nose: No congestion/rhinnorhea. Mouth/Throat: Mucous membranes are moist.  Oropharynx non-erythematous. Neck: No stridor.   Cardiovascular: Normal rate, regular rhythm. Grossly normal heart sounds.  Good peripheral circulation. Respiratory: Normal respiratory effort.  No retractions. Lungs CTAB. Gastrointestinal: Soft faint epigastric tenderness to palpation. No rebound or guarding. No CVA tenderness. Genitourinary: deferred Musculoskeletal: No lower extremity tenderness nor edema.  No joint effusions. Neurologic:  Normal speech and language. No gross focal neurologic deficits are appreciated. No gait instability. Skin:  Skin is warm, dry and intact. No rash  noted. Psychiatric: Mood and affect are normal. Speech and behavior are normal.  ____________________________________________   LABS (all labs ordered are listed, but only abnormal results are displayed)  Labs Reviewed  BASIC METABOLIC PANEL - Abnormal; Notable for the following:    Glucose, Bld 120 (*)    All other components within normal limits  HEPATIC FUNCTION PANEL - Abnormal; Notable for the following:    ALT 12 (*)    Total Bilirubin 1.4 (*)    Indirect Bilirubin 1.2 (*)    All other components within normal limits  TROPONIN I  CBC  LIPASE, BLOOD  TROPONIN I   ____________________________________________  EKG  ED ECG REPORT I, Joanne Gavel, the attending physician, personally viewed and interpreted this ECG.   Date: 09/20/2015  EKG Time: 13:00  Rate: 76  Rhythm: normal EKG, normal sinus rhythm  Axis: normal  Intervals:none  ST&T Change: No acute ST elevation  ____________________________________________  RADIOLOGY  CXR IMPRESSION: No active cardiopulmonary disease.  CTA chest IMPRESSION: No CT evidence for acute pulmonary embolus. No findings explain patient's history chest pain.  ____________________________________________   PROCEDURES  Procedure(s) performed: None  Critical Care performed: No  ____________________________________________   INITIAL IMPRESSION / ASSESSMENT AND PLAN / ED COURSE  Pertinent labs & imaging results that were available during my care of the patient were reviewed by me and considered in my medical decision making (see chart for details).  Alexander [name] is a 60 y.o. male with COPD not on home oxygen, mantle  cell lymphoma, chronic pain condition, chronic dizziness and history of syncopal episodes thought to be secondary to orthostatic hypotension on Midodrine who presents for evaluation of his typical  abdominal pain as well as 2 days of intermittent left-sided chest soreness, gradual onset, intermittent,  currently moderate. On exam, he is nontoxic appearing and in no acute distress. Vital signs are stable, he is afebrile. He does have some mild epigastric tenderness to palpation. EKG is normal, 2 sets of troponins over 5 hours are negative and I doubt this represents ACS. CTA chest negative for PE. There is no evidence of thoracic aortic aneurysm or dissection. CBC, CMP, lipase are generally unremarkable with the exception of mild elevation of the T bili at 1.4. Patient feels better after Phenergan, Dilaudid, IV fluids. I suspect his syncopal episode was likely vasovagal in nature and likely related to his known orthostatic hypotension as it is similar to his prior episodes. Doubt cardiogenic or neurogenic cause of his syncope. His neurological examination is intact. He had no chest pain or palpitations associated with the syncopal episode. We discussed meticulous return precautions and the patient is comfortable with discharge plan. DC home. ____________________________________________   FINAL CLINICAL IMPRESSION(S) / ED DIAGNOSES  Final diagnoses:  Chest pain, unspecified chest pain type  Abdominal pain, unspecified abdominal location      Joanne Gavel, MD 09/20/15 2303

## 2015-09-22 ENCOUNTER — Emergency Department: Payer: Medicaid Other

## 2015-09-22 ENCOUNTER — Emergency Department
Admission: EM | Admit: 2015-09-22 | Discharge: 2015-09-22 | Disposition: A | Discharge status: Home / Self Care | Payer: Medicaid Other | Attending: Emergency Medicine | Admitting: Emergency Medicine

## 2015-09-22 DIAGNOSIS — Z79891 Long term (current) use of opiate analgesic: Secondary | ICD-10-CM | POA: Insufficient documentation

## 2015-09-22 DIAGNOSIS — R42 Dizziness and giddiness: Secondary | ICD-10-CM | POA: Insufficient documentation

## 2015-09-22 DIAGNOSIS — R1013 Epigastric pain: Secondary | ICD-10-CM | POA: Diagnosis not present

## 2015-09-22 DIAGNOSIS — G8929 Other chronic pain: Secondary | ICD-10-CM | POA: Insufficient documentation

## 2015-09-22 DIAGNOSIS — Z87891 Personal history of nicotine dependence: Secondary | ICD-10-CM | POA: Insufficient documentation

## 2015-09-22 DIAGNOSIS — Z79899 Other long term (current) drug therapy: Secondary | ICD-10-CM | POA: Insufficient documentation

## 2015-09-22 DIAGNOSIS — R1110 Vomiting, unspecified: Secondary | ICD-10-CM

## 2015-09-22 DIAGNOSIS — Z7952 Long term (current) use of systemic steroids: Secondary | ICD-10-CM | POA: Diagnosis not present

## 2015-09-22 DIAGNOSIS — Z792 Long term (current) use of antibiotics: Secondary | ICD-10-CM | POA: Insufficient documentation

## 2015-09-22 DIAGNOSIS — R55 Syncope and collapse: Secondary | ICD-10-CM | POA: Diagnosis not present

## 2015-09-22 DIAGNOSIS — R112 Nausea with vomiting, unspecified: Secondary | ICD-10-CM | POA: Insufficient documentation

## 2015-09-22 DIAGNOSIS — E876 Hypokalemia: Secondary | ICD-10-CM

## 2015-09-22 DIAGNOSIS — R197 Diarrhea, unspecified: Secondary | ICD-10-CM | POA: Diagnosis not present

## 2015-09-22 LAB — HEPATIC FUNCTION PANEL
ALBUMIN: 4.7 g/dL (ref 3.5–5.0)
ALK PHOS: 109 U/L (ref 38–126)
ALT: 12 U/L — ABNORMAL LOW (ref 17–63)
AST: 20 U/L (ref 15–41)
BILIRUBIN DIRECT: 0.1 mg/dL (ref 0.1–0.5)
BILIRUBIN INDIRECT: 1.1 mg/dL — AB (ref 0.3–0.9)
BILIRUBIN TOTAL: 1.2 mg/dL (ref 0.3–1.2)
Total Protein: 7.3 g/dL (ref 6.5–8.1)

## 2015-09-22 LAB — URINALYSIS COMPLETE WITH MICROSCOPIC (ARMC ONLY)
BILIRUBIN URINE: NEGATIVE
GLUCOSE, UA: NEGATIVE mg/dL
KETONES UR: NEGATIVE mg/dL
LEUKOCYTES UA: NEGATIVE
Nitrite: NEGATIVE
Protein, ur: NEGATIVE mg/dL
SQUAMOUS EPITHELIAL / LPF: NONE SEEN
Specific Gravity, Urine: 1.004 — ABNORMAL LOW (ref 1.005–1.030)
pH: 7 (ref 5.0–8.0)

## 2015-09-22 LAB — CBC
HEMATOCRIT: 43.2 % (ref 40.0–52.0)
Hemoglobin: 14.9 g/dL (ref 13.0–18.0)
MCH: 29.1 pg (ref 26.0–34.0)
MCHC: 34.4 g/dL (ref 32.0–36.0)
MCV: 84.7 fL (ref 80.0–100.0)
PLATELETS: 198 10*3/uL (ref 150–440)
RBC: 5.1 MIL/uL (ref 4.40–5.90)
RDW: 13.5 % (ref 11.5–14.5)
WBC: 7.6 10*3/uL (ref 3.8–10.6)

## 2015-09-22 LAB — BASIC METABOLIC PANEL
Anion gap: 9 (ref 5–15)
BUN: 11 mg/dL (ref 6–20)
CHLORIDE: 99 mmol/L — AB (ref 101–111)
CO2: 29 mmol/L (ref 22–32)
CREATININE: 0.79 mg/dL (ref 0.61–1.24)
Calcium: 9.2 mg/dL (ref 8.9–10.3)
GFR calc Af Amer: >60 mL/min (ref >60)
GFR calc non Af Amer: >60 mL/min (ref >60)
GLUCOSE: 118 mg/dL — AB (ref 65–99)
Potassium: 2.9 mmol/L — CL (ref 3.5–5.1)
SODIUM: 137 mmol/L (ref 135–145)

## 2015-09-22 LAB — GLUCOSE, CAPILLARY: Glucose-Capillary: 96 mg/dL (ref 65–99)

## 2015-09-22 LAB — LIPASE, BLOOD: Lipase: 18 U/L (ref 11–51)

## 2015-09-22 LAB — TROPONIN I

## 2015-09-22 MED ORDER — PROMETHAZINE HCL 25 MG PO TABS
25.0000 mg | ORAL_TABLET | Freq: Once | ORAL | Status: AC
  Administered 2015-09-22: 25 mg via ORAL
  Filled 2015-09-22: qty 1

## 2015-09-22 MED ORDER — SODIUM CHLORIDE 0.9 % IV SOLN
Freq: Once | INTRAVENOUS | Status: AC
  Administered 2015-09-22: 14:00:00 via INTRAVENOUS

## 2015-09-22 MED ORDER — PROMETHAZINE HCL 25 MG/ML IJ SOLN
25.0000 mg | Freq: Once | INTRAMUSCULAR | Status: DC
  Filled 2015-09-22: qty 1

## 2015-09-22 MED ORDER — SODIUM CHLORIDE 0.9 % IV BOLUS (SEPSIS)
1000.0000 mL | Freq: Once | INTRAVENOUS | Status: AC
  Administered 2015-09-22: 1000 mL via INTRAVENOUS

## 2015-09-22 MED ORDER — POTASSIUM CHLORIDE 10 MEQ/100ML IV SOLN
10.0000 meq | Freq: Once | INTRAVENOUS | Status: AC
  Administered 2015-09-22: 10 meq via INTRAVENOUS
  Filled 2015-09-22: qty 100

## 2015-09-22 MED ORDER — PROMETHAZINE HCL 25 MG/ML IJ SOLN
12.5000 mg | Freq: Once | INTRAMUSCULAR | Status: AC
  Administered 2015-09-22: 12.5 mg via INTRAVENOUS

## 2015-09-22 MED ORDER — POTASSIUM CHLORIDE CRYS ER 20 MEQ PO TBCR
40.0000 meq | EXTENDED_RELEASE_TABLET | Freq: Once | ORAL | Status: AC
  Administered 2015-09-22: 40 meq via ORAL
  Filled 2015-09-22: qty 2

## 2015-09-22 MED ORDER — MORPHINE SULFATE (PF) 4 MG/ML IV SOLN
6.0000 mg | Freq: Once | INTRAVENOUS | Status: AC
  Administered 2015-09-22: 6 mg via INTRAVENOUS
  Filled 2015-09-22: qty 2

## 2015-09-22 MED ORDER — MAGNESIUM SULFATE 2 GM/50ML IV SOLN
2.0000 g | Freq: Once | INTRAVENOUS | Status: AC
  Administered 2015-09-22: 2 g via INTRAVENOUS
  Filled 2015-09-22: qty 50

## 2015-09-22 NOTE — ED Notes (Signed)
MD Quale at bedside. 

## 2015-09-22 NOTE — ED Notes (Signed)
Spoke with Paduchowski regarding pt symptoms. Verbal orders given. PT placed in subwait with wife at side watch pt.

## 2015-09-22 NOTE — Discharge Instructions (Signed)
You were seen in the emergency room for abdominal pain. It is important that you follow up closely with your primary care doctor in the next couple of days.  If you're unable to see her primary care doctor you may return to the emergency room or go to the [place] walk-in clinic in 1 or 2 days for reexam.  Please return to the emergency room right away if you are to develop a fever, severe nausea, your pain becomes severe or worsens, you are unable to keep food down, begin vomiting any dark or bloody fluid, you develop any dark or bloody stools, feel dehydrated, or other new concerns or symptoms arise.  Diarrhea Diarrhea is watery poop (stool). It can make you feel weak, tired, thirsty, or give you a dry mouth (signs of dehydration). Watery poop is a sign of another problem, most often an infection. It often lasts 2-3 days. It can last longer if it is a sign of something serious. Take care of yourself as told by your doctor. HOME CARE   Drink 1 cup (8 ounces) of fluid each time you have watery poop.  Do not drink the following fluids:  Those that contain simple sugars (fructose, glucose, galactose, lactose, sucrose, maltose).  Sports drinks.  Fruit juices.  Whole milk products.  Sodas.  Drinks with caffeine (coffee, tea, soda) or alcohol.  Oral rehydration solution may be used if the doctor says it is okay. You may make your own solution. Follow this recipe:   - teaspoon table salt.   teaspoon baking soda.   teaspoon salt substitute containing potassium chloride.  1 tablespoons sugar.  1 liter (34 ounces) of water.  Avoid the following foods:  High fiber foods, such as raw fruits and vegetables.  Nuts, seeds, and whole grain breads and cereals.   Those that are sweetened with sugar alcohols (xylitol, sorbitol, mannitol).  Try eating the following foods:  Starchy foods, such as rice, toast, pasta, low-sugar cereal, oatmeal, baked potatoes, crackers, and  bagels.  Bananas.  Applesauce.  Eat probiotic-rich foods, such as yogurt and milk products that are fermented.  Wash your hands well after each time you have watery poop.  Only take medicine as told by your doctor.  Take a warm bath to help lessen burning or pain from having watery poop. GET HELP RIGHT AWAY IF:   You cannot drink fluids without throwing up (vomiting).  You keep throwing up.  You have blood in your poop, or your poop looks black and tarry.  You do not pee (urinate) in 6-8 hours, or there is only a small amount of very dark pee.  You have belly (abdominal) pain that gets worse or stays in the same spot (localizes).  You are weak, dizzy, confused, or light-headed.  You have a very bad headache.  Your watery poop gets worse or does not get better.  You have a fever or lasting symptoms for more than 2-3 days.  You have a fever and your symptoms suddenly get worse. MAKE SURE YOU:   Understand these instructions.  Will watch your condition.  Will get help right away if you are not doing well or get worse.   This information is not intended to replace advice given to you by your health care provider. Make sure you discuss any questions you have with your health care provider.   Document Released: 01/22/2008 Document Revised: 08/26/2014 Document Reviewed: 04/12/2012 Elsevier Interactive Patient Education 2016 Elsevier Inc.   Abdominal Pain, Adult  Many things can cause abdominal pain. Usually, abdominal pain is not caused by a disease and will improve without treatment. It can often be observed and treated at home. Your health care provider will do a physical exam and possibly order blood tests and X-rays to help determine the seriousness of your pain. However, in many cases, more time must pass before a clear cause of the pain can be found. Before that point, your health care provider may not know if you need more testing or further treatment. HOME CARE  INSTRUCTIONS Monitor your abdominal pain for any changes. The following actions may help to alleviate any discomfort you are experiencing:  Only take over-the-counter or prescription medicines as directed by your health care provider.  Do not take laxatives unless directed to do so by your health care provider.  Try a clear liquid diet (broth, tea, or water) as directed by your health care provider. Slowly move to a bland diet as tolerated. SEEK MEDICAL CARE IF:  You have unexplained abdominal pain.  You have abdominal pain associated with nausea or diarrhea.  You have pain when you urinate or have a bowel movement.  You experience abdominal pain that wakes you in the night.  You have abdominal pain that is worsened or improved by eating food.  You have abdominal pain that is worsened with eating fatty foods.  You have a fever. SEEK IMMEDIATE MEDICAL CARE IF:  Your pain does not go away within 2 hours.  You keep throwing up (vomiting).  Your pain is felt only in portions of the abdomen, such as the right side or the left lower portion of the abdomen.  You pass bloody or black tarry stools. MAKE SURE YOU:  Understand these instructions.  Will watch your condition.  Will get help right away if you are not doing well or get worse.   This information is not intended to replace advice given to you by your health care provider. Make sure you discuss any questions you have with your health care provider.   Document Released: 05/15/2005 Document Revised: 04/26/2015 Document Reviewed: 04/14/2013 Elsevier Interactive Patient Education [employer].

## 2015-09-22 NOTE — ED Notes (Addendum)
Pt arrives POV from home with wife c/o syncope X 3 today accompanied with NVD. Pt seen in ER for NVD and discharged on Wednesday. PT states syncopal episodes were unwitnessed. Denies pain from falling. Pt states he feel dizzy now, has to lean in back of chair, unable to ambulate withotu assistance.

## 2015-09-22 NOTE — ED Notes (Addendum)
Phenergan diluted in 50cc NS.  Wife at bedside supervising patient. Pt agrees not to get up.

## 2015-09-22 NOTE — ED Provider Notes (Signed)
[facility] Emergency Department Provider Note  ____________________________________________  Time seen: Approximately 4:02 PM  I have reviewed the triage vital signs and the nursing notes.   HISTORY  Chief Complaint Loss of Consciousness; Nausea; Emesis; Diarrhea; and Dizziness    HPI Alexander [name] is a 60 y.o. male since for evaluation of weakness and abdominal pain. He denies chest pain. States that he has chronic pain in his left stomach. He experienced some viscous discomfort today, when he laid down on the kitchen floor and thinks he may have passed out. He has passed out before reports he passed out several times once last month. He has been having nausea and vomiting and ongoing pain in the upper to left stomach. Since he is seeing Dr. Candace Cruise of gastroenterology for the same symptoms multiple times with unknown cause.  Denies blood in the stool or vomit. He reports has not been able to keep food down for about the last 2 days.   Past Medical History  Diagnosis Date  . COPD (chronic obstructive pulmonary disease) ([place])   . Migraine   . Asthma   . BPH (benign prostatic hyperplasia)   . Deviated nasal septum   . DVT (deep venous thrombosis) ([place])   . Mantle cell lymphoma ([place])   . Erosive esophagitis     Patient Active Problem List   Diagnosis Date Noted  . Pressure ulcer 08/18/2015  . HCAP (healthcare-associated pneumonia) 08/17/2015  . Syncope and collapse 08/10/2015  . Syncope 08/10/2015  . Protein-calorie malnutrition, severe 08/10/2015  . Abdominal pain 07/17/2015  . Nausea & vomiting 07/17/2015  . Malnutrition of moderate degree 07/17/2015  . Diarrhea 05/18/2015  . DEVIATED SEPTUM 05/18/2007  . ALLERGIC RHINITIS 05/18/2007  . ASTHMA 05/18/2007    Past Surgical History  Procedure Laterality Date  . Throat surgery    . Barryx N/A   . Esophagogastroduodenoscopy (egd) with propofol N/A 02/23/2015    Procedure: ESOPHAGOGASTRODUODENOSCOPY  (EGD) WITH PROPOFOL;  Surgeon: Hulen Luster, MD;  Location: [facility] ENDOSCOPY;  Service: Gastroenterology;  Laterality: N/A;  . Esophagogastroduodenoscopy (egd) with propofol N/A 05/04/2015    Procedure: ESOPHAGOGASTRODUODENOSCOPY (EGD) WITH PROPOFOL;  Surgeon: Hulen Luster, MD;  Location: [facility] ENDOSCOPY;  Service: Gastroenterology;  Laterality: N/A;  . Tonsillectomy    . Esophagogastroduodenoscopy (egd) with propofol N/A 07/27/2015    Procedure: ESOPHAGOGASTRODUODENOSCOPY (EGD) WITH PROPOFOL;  Surgeon: Hulen Luster, MD;  Location: [facility] ENDOSCOPY;  Service: Gastroenterology;  Laterality: N/A;    Current Outpatient Rx  Name  Route  Sig  Dispense  Refill  . albuterol (PROAIR HFA) 108 (90 Base) MCG/ACT inhaler   Inhalation   Inhale 1-2 puffs into the lungs every 4 (four) hours as needed.         Marland Kitchen amoxicillin-clavulanate (AUGMENTIN) 875-125 MG tablet   Oral   Take 1 tablet by mouth 2 (two) times daily.   12 tablet   0   . docusate sodium (COLACE) 100 MG capsule   Oral   Take 200 mg by mouth 2 (two) times daily as needed for mild constipation.         . fluticasone (FLONASE) 50 MCG/ACT nasal spray   Each Nare   Place 2 sprays into both nostrils daily as needed for rhinitis.          . haloperidol (HALDOL) 0.5 MG tablet   Oral   Take 0.5 mg by mouth 4 (four) times daily as needed.         Marland Kitchen  loperamide (IMODIUM) 2 MG capsule   Oral   Take 2 mg by mouth as needed for diarrhea or loose stools.         . midodrine (PROAMATINE) 2.5 MG tablet   Oral   Take 1 tablet (2.5 mg total) by mouth 2 (two) times daily with a meal.   60 tablet   2   . morphine (MSIR) 30 MG tablet   Oral   Take 30 mg by mouth 3 (three) times daily.         Marland Kitchen OLANZapine (ZYPREXA) 5 MG tablet   Oral   Take 2.5 mg by mouth every 6 (six) hours as needed (for GI upset).          . Omeprazole (RA OMEPRAZOLE) 20 MG TBEC   Oral   Take 20 mg by mouth 2 (two) times daily.         . ondansetron (ZOFRAN ODT) 4  MG disintegrating tablet   Oral   Take 1 tablet (4 mg total) by mouth every 8 (eight) hours as needed for nausea or vomiting.   20 tablet   0   . Oxycodone HCl 10 MG TABS   Oral   Take 1 tablet (10 mg total) by mouth every 4 (four) hours as needed.   15 tablet   0   . predniSONE (STERAPRED UNI-PAK 21 TAB) 10 MG (21) TBPK tablet      Take 6 tabs first day, 5 tab on day 2, then 4 on day 3rd, 3 tabs on day 4th , 2 tab on day 5th, and 1 tab on 6th day.   21 tablet   0   . promethazine (PHENERGAN) 25 MG suppository   Rectal   Place 1 suppository (25 mg total) rectally every 6 (six) hours as needed for nausea or vomiting.   12 suppository   0   . rivaroxaban (XARELTO) 20 MG TABS tablet   Oral   Take 1 tablet (20 mg total) by mouth daily with supper.   30 tablet   0   . senna (SENOKOT) 8.6 MG TABS tablet   Oral   Take 2 tablets by mouth 2 (two) times daily as needed for mild constipation.         . SUMAtriptan (IMITREX) 50 MG tablet   Oral   Take 50 mg by mouth every 2 (two) hours as needed for migraine.          . tamsulosin (FLOMAX) 0.4 MG CAPS capsule   Oral   Take 0.4 mg by mouth at bedtime.         . traZODone (DESYREL) 50 MG tablet   Oral   Take 50 mg by mouth at bedtime.         . urea (CARMOL) 20 % cream   Topical   Apply 1 application topically as needed (for dry skin on feet).            Allergies Ativan and Bee venom  Family History  Problem Relation Age of Onset  . Mesothelioma Father   . Breast cancer Mother     Social History Social History  Substance Use Topics  . Smoking status: Former Research scientist (life sciences)  . Smokeless tobacco: None  . Alcohol Use: No    Review of Systems Constitutional: No fever/chills Eyes: No visual changes. ENT: No sore throat. Cardiovascular: Denies chest pain. Respiratory: Denies shortness of breath. Gastrointestinal:   No constipation. Genitourinary: Negative for dysuria. Musculoskeletal: Negative for  back  pain. Skin: Negative for rash. Neurological: Negative for headaches, focal weakness or numbness.  10-point ROS otherwise negative.  ____________________________________________   PHYSICAL EXAM:  VITAL SIGNS: ED Triage Vitals  Enc Vitals Group     BP 09/22/15 1319 125/76 mmHg     Pulse Rate 09/22/15 1319 83     Resp 09/22/15 1319 20     Temp 09/22/15 1319 98.4 F (36.9 C)     Temp Source 09/22/15 1319 Oral     SpO2 09/22/15 1319 98 %     Weight 09/22/15 1319 165 lb (74.844 kg)     Height 09/22/15 1319 5\' 10"  (1.778 m)     Head Cir --      Peak Flow --      Pain Score 09/22/15 1320 9     Pain Loc --      Pain Edu? --      Excl. in [place]? --    Constitutional: Alert and oriented. Well appearing and in no acute distress. Eyes: Conjunctivae are normal. PERRL. EOMI. Head: Atraumatic. Nose: No congestion/rhinnorhea. Mouth/Throat: Mucous membranes are very dry.  Oropharynx non-erythematous. Neck: No stridor.   Cardiovascular: Normal rate, regular rhythm. Grossly normal heart sounds.  Good peripheral circulation. Respiratory: Normal respiratory effort.  No retractions. Lungs CTAB. Gastrointestinal: Soft and nontender except for moderate discomfort in the epigastrium. No distention. No abdominal bruits. No CVA tenderness. Musculoskeletal: No lower extremity tenderness nor edema.  No joint effusions. Neurologic:  Normal speech and language. No gross focal neurologic deficits are appreciated. No gait instability. Skin:  Skin is warm, dry and intact. No rash noted. Psychiatric: Mood and affect are normal. Speech and behavior are normal.  ____________________________________________   LABS (all labs ordered are listed, but only abnormal results are displayed)  Labs Reviewed  BASIC METABOLIC PANEL - Abnormal; Notable for the following:    Potassium 2.9 (*)    Chloride 99 (*)    Glucose, Bld 118 (*)    All other components within normal limits  URINALYSIS COMPLETEWITH MICROSCOPIC  ([facility] ONLY) - Abnormal; Notable for the following:    Color, Urine YELLOW (*)    APPearance CLEAR (*)    Specific Gravity, Urine 1.004 (*)    Hgb urine dipstick 3+ (*)    Bacteria, UA RARE (*)    All other components within normal limits  HEPATIC FUNCTION PANEL - Abnormal; Notable for the following:    ALT 12 (*)    Indirect Bilirubin 1.1 (*)    All other components within normal limits  CBC  GLUCOSE, CAPILLARY  TROPONIN I  LIPASE, BLOOD  CBG MONITORING, ED   ____________________________________________  EKG  Reviewed and interpreted by me at 1325 Ventricular rate 92 QRS 90 QTc 460 Normal sinus rhythm, incomplete right bundle-branch block, no significant change compared with previous. No evidence of acute ST elevation or ischemic abnormality. ____________________________________________  RADIOLOGY  DG Abd 2 Views (Final result) Result time: 09/22/15 17:10:15   Final result by Rad Results In Interface (09/22/15 17:10:15)   Narrative:   CLINICAL DATA: Abdominal pain. Nausea and vomiting.  EXAM: ABDOMEN - 2 VIEW  COMPARISON: CT abdomen pelvis 08/17/2015  FINDINGS: The bowel gas pattern is normal. There is no evidence of free air. No radio-opaque calculi or other significant radiographic abnormality is seen.  IMPRESSION: Negative.   Electronically Signed By: San Morelle M.D. On: 09/22/2015 17:10          CT Head Wo Contrast (Final result) Result time:  09/22/15 16:49:37   Final result by Rad Results In Interface (09/22/15 16:49:37)   Narrative:   CLINICAL DATA: Syncope 3 times today with nausea and vomiting. Dizziness. Possibly struck head.  EXAM: CT HEAD WITHOUT CONTRAST  TECHNIQUE: Contiguous axial images were obtained from the base of the skull through the vertex without intravenous contrast.  COMPARISON: 08/11/2015  FINDINGS: As before there is some atherosclerotic calcification in the dominant left vertebral artery at  the level of the foramen magnum.  The brainstem, cerebellum, cerebral peduncles, thalami, basal ganglia, basilar cisterns, and ventricular system appear within normal limits. No intracranial hemorrhage, mass lesion, or acute CVA.  Chronic bilateral ethmoid sinusitis. Mild chronic left sphenoid sinusitis.  IMPRESSION: 1. No acute intracranial findings. 2. Mild atherosclerotic calcification of the dominant left vertebral artery at the skull base. 3. Chronic bilateral ethmoid sinusitis. Minimal chronic left sphenoid sinusitis.   Electronically Signed By: Van Clines M.D. On: 09/22/2015 16:49    ____________________________________________   PROCEDURES  Procedure(s) performed: None  Critical Care performed: No  ____________________________________________   INITIAL IMPRESSION / ASSESSMENT AND PLAN / ED COURSE  Pertinent labs & imaging results that were available during my care of the patient were reviewed by me and considered in my medical decision making (see chart for details).  Patient prresents for evaluation of abdominal pain nausea and vomiting and passing out. He reports he has chronic abdominal issues and frequently will experience nausea, vomiting, pain in the left upper abdomen where it is today. He has passed out many times in the past, they believe this might be due to trouble with his blood pressure when he stands up in particular.  ----------------------------------------- 5:32 PM on 09/22/2015 -----------------------------------------  Patient awake alert no distress. Currently no further loose bowel movements and no vomiting in the emergency room. States his symptoms are better. Because of a history of syncope, today is not associated acute cardiac or pulmonary symptoms. I suspect is likely multifactorial likely associated with some dehydration, possible vasovagal or orthostasis, but no evidence of acute cardiac disease. His CT that is reassuring.  He is on Xarelto, should prevent pulmonary embolism.  Potassium slightly low. We'll replete.  ----------------------------------------- 6:15 PM on 09/22/2015 -----------------------------------------  Patient reports his mild nausea. He is awake and alert in no distress. Repeat abdominal exam abdomen soft nontender nondistended. We will give by mouth Phenergan as well as by mouth challenge. This point, plan to likely discharge the patient after hydration and repletion of electrolytes. No evidence of peritonitis or acute bowel obstruction. Exam , vital signs labs reassuring.  ----------------------------------------- 8:24 PM on 09/22/2015 -----------------------------------------  Patient awake alert. They relate no distress. Passed PO challenge. Discharged to home. Return precautions and treatment recommendations and follow-up discussed with the patient who is agreeable with the plan.  ____________________________________________   FINAL CLINICAL IMPRESSION(S) / ED DIAGNOSES  Final diagnoses:  Epigastric pain  Vomiting and diarrhea  Hypokalemia      Delman Kitten, MD 09/22/15 2025

## 2015-09-22 NOTE — ED Notes (Signed)
Patient transported to CT 

## 2015-09-24 ENCOUNTER — Encounter: Payer: Self-pay | Admitting: Emergency Medicine

## 2015-09-24 ENCOUNTER — Observation Stay
Admission: EM | Admit: 2015-09-24 | Discharge: 2015-09-26 | Disposition: A | Discharge status: Home / Self Care | Payer: Medicaid Other | Attending: Internal Medicine | Admitting: Internal Medicine

## 2015-09-24 ENCOUNTER — Emergency Department: Payer: Medicaid Other

## 2015-09-24 DIAGNOSIS — Z8572 Personal history of non-Hodgkin lymphomas: Secondary | ICD-10-CM | POA: Insufficient documentation

## 2015-09-24 DIAGNOSIS — R109 Unspecified abdominal pain: Secondary | ICD-10-CM

## 2015-09-24 DIAGNOSIS — J45909 Unspecified asthma, uncomplicated: Secondary | ICD-10-CM | POA: Insufficient documentation

## 2015-09-24 DIAGNOSIS — R296 Repeated falls: Secondary | ICD-10-CM | POA: Insufficient documentation

## 2015-09-24 DIAGNOSIS — Z87891 Personal history of nicotine dependence: Secondary | ICD-10-CM | POA: Insufficient documentation

## 2015-09-24 DIAGNOSIS — R1032 Left lower quadrant pain: Secondary | ICD-10-CM | POA: Insufficient documentation

## 2015-09-24 DIAGNOSIS — K5730 Diverticulosis of large intestine without perforation or abscess without bleeding: Secondary | ICD-10-CM | POA: Insufficient documentation

## 2015-09-24 DIAGNOSIS — N400 Enlarged prostate without lower urinary tract symptoms: Secondary | ICD-10-CM | POA: Diagnosis not present

## 2015-09-24 DIAGNOSIS — R197 Diarrhea, unspecified: Secondary | ICD-10-CM | POA: Diagnosis present

## 2015-09-24 DIAGNOSIS — K529 Noninfective gastroenteritis and colitis, unspecified: Principal | ICD-10-CM | POA: Diagnosis present

## 2015-09-24 DIAGNOSIS — G43909 Migraine, unspecified, not intractable, without status migrainosus: Secondary | ICD-10-CM | POA: Insufficient documentation

## 2015-09-24 DIAGNOSIS — R55 Syncope and collapse: Secondary | ICD-10-CM | POA: Diagnosis not present

## 2015-09-24 DIAGNOSIS — E876 Hypokalemia: Secondary | ICD-10-CM | POA: Diagnosis present

## 2015-09-24 DIAGNOSIS — J449 Chronic obstructive pulmonary disease, unspecified: Secondary | ICD-10-CM | POA: Insufficient documentation

## 2015-09-24 DIAGNOSIS — R4182 Altered mental status, unspecified: Secondary | ICD-10-CM | POA: Diagnosis present

## 2015-09-24 DIAGNOSIS — E860 Dehydration: Secondary | ICD-10-CM | POA: Diagnosis not present

## 2015-09-24 DIAGNOSIS — J309 Allergic rhinitis, unspecified: Secondary | ICD-10-CM | POA: Insufficient documentation

## 2015-09-24 DIAGNOSIS — G8929 Other chronic pain: Secondary | ICD-10-CM | POA: Insufficient documentation

## 2015-09-24 DIAGNOSIS — R441 Visual hallucinations: Secondary | ICD-10-CM | POA: Diagnosis not present

## 2015-09-24 DIAGNOSIS — R531 Weakness: Secondary | ICD-10-CM | POA: Diagnosis not present

## 2015-09-24 LAB — URINALYSIS COMPLETE WITH MICROSCOPIC (ARMC ONLY)
BILIRUBIN URINE: NEGATIVE
Bacteria, UA: NONE SEEN
Glucose, UA: NEGATIVE mg/dL
HGB URINE DIPSTICK: NEGATIVE
KETONES UR: NEGATIVE mg/dL
Leukocytes, UA: NEGATIVE
NITRITE: NEGATIVE
PH: 5 (ref 5.0–8.0)
Protein, ur: NEGATIVE mg/dL
SPECIFIC GRAVITY, URINE: 1.023 (ref 1.005–1.030)

## 2015-09-24 LAB — CBC WITH DIFFERENTIAL/PLATELET
Basophils Absolute: 0.1 10*3/uL (ref 0–0.1)
Basophils Relative: 1 %
EOS ABS: 0.2 10*3/uL (ref 0–0.7)
Eosinophils Relative: 2 %
HCT: 42.6 % (ref 40.0–52.0)
HEMOGLOBIN: 14.7 g/dL (ref 13.0–18.0)
LYMPHS ABS: 4.3 10*3/uL — AB (ref 1.0–3.6)
Lymphocytes Relative: 43 %
MCH: 29.5 pg (ref 26.0–34.0)
MCHC: 34.5 g/dL (ref 32.0–36.0)
MCV: 85.4 fL (ref 80.0–100.0)
Monocytes Absolute: 1 10*3/uL (ref 0.2–1.0)
Monocytes Relative: 10 %
NEUTROS PCT: 44 %
Neutro Abs: 4.5 10*3/uL (ref 1.4–6.5)
Platelets: 208 10*3/uL (ref 150–440)
RBC: 4.99 MIL/uL (ref 4.40–5.90)
RDW: 13.7 % (ref 11.5–14.5)
WBC: 10 10*3/uL (ref 3.8–10.6)

## 2015-09-24 LAB — COMPREHENSIVE METABOLIC PANEL
ALBUMIN: 4.3 g/dL (ref 3.5–5.0)
ALT: 10 U/L — ABNORMAL LOW (ref 17–63)
ANION GAP: 8 (ref 5–15)
AST: 16 U/L (ref 15–41)
Alkaline Phosphatase: 101 U/L (ref 38–126)
BILIRUBIN TOTAL: 0.8 mg/dL (ref 0.3–1.2)
BUN: 19 mg/dL (ref 6–20)
CO2: 29 mmol/L (ref 22–32)
Calcium: 9.2 mg/dL (ref 8.9–10.3)
Chloride: 103 mmol/L (ref 101–111)
Creatinine, Ser: 0.9 mg/dL (ref 0.61–1.24)
GFR calc Af Amer: >60 mL/min (ref >60)
GFR calc non Af Amer: >60 mL/min (ref >60)
GLUCOSE: 74 mg/dL (ref 65–99)
POTASSIUM: 3.1 mmol/L — AB (ref 3.5–5.1)
SODIUM: 140 mmol/L (ref 135–145)
TOTAL PROTEIN: 7.1 g/dL (ref 6.5–8.1)

## 2015-09-24 LAB — URINE DRUG SCREEN, QUALITATIVE (ARMC ONLY)
Amphetamines, Ur Screen: NOT DETECTED
BARBITURATES, UR SCREEN: NOT DETECTED
BENZODIAZEPINE, UR SCRN: NOT DETECTED
Cannabinoid 50 Ng, Ur ~~LOC~~: NOT DETECTED
Cocaine Metabolite,Ur ~~LOC~~: NOT DETECTED
MDMA (Ecstasy)Ur Screen: NOT DETECTED
Methadone Scn, Ur: NOT DETECTED
OPIATE, UR SCREEN: NOT DETECTED
PHENCYCLIDINE (PCP) UR S: NOT DETECTED
Tricyclic, Ur Screen: NOT DETECTED

## 2015-09-24 LAB — TROPONIN I: Troponin I: <0.03 ng/mL (ref <0.031)

## 2015-09-24 LAB — LACTIC ACID, PLASMA: Lactic Acid, Venous: 1.1 mmol/L (ref 0.5–2.0)

## 2015-09-24 MED ORDER — IOHEXOL 240 MG/ML SOLN
25.0000 mL | Freq: Once | INTRAMUSCULAR | Status: AC | PRN
  Administered 2015-09-24: 25 mL via ORAL

## 2015-09-24 MED ORDER — SODIUM CHLORIDE 0.9 % IV BOLUS (SEPSIS)
1000.0000 mL | Freq: Once | INTRAVENOUS | Status: AC
  Administered 2015-09-24: 1000 mL via INTRAVENOUS

## 2015-09-24 MED ORDER — IOHEXOL 300 MG/ML  SOLN
100.0000 mL | Freq: Once | INTRAMUSCULAR | Status: AC | PRN
  Administered 2015-09-24: 100 mL via INTRAVENOUS

## 2015-09-24 NOTE — ED Notes (Signed)
Per EMS, patient comes from home with c/o weakness and confusion. Patients family states he has been more confused and has been falling more often at home. Patient is A&O x3 (disoriented to time). EMS states patient was having visual hallucinations. Patient was trying to grab a crate a pop in the back of the EMS truck. EMS also states that patient saw his reflection in the widow and states "Thats my father in law".

## 2015-09-24 NOTE — ED Notes (Signed)
Lab called and notified this RN that their machine is unable to process patients urine for urine drug screen. Lab states they have tried "spinning it down" multiple times. Lab notified that patient had to be catheterized to get the first urine sample. When asked if there was an insufficient amount of urine sent, Lab states "That's not it. I'm telling you we just need another sample". New sample obtained and sent.

## 2015-09-24 NOTE — ED Provider Notes (Signed)
[facility] Emergency Department Provider Note  ____________________________________________  Time seen: Approximately 9:01 PM  I have reviewed the triage vital signs and the nursing notes.   HISTORY  Chief Complaint Altered Mental Status    HPI Alexander [name] is a 60 y.o. male patient brought in by EMS who report family says he has been talking to family members who are not present reaching for things on the floor that are not there and just not acting like himself being more confused than usual today. Patient says he's been vomiting for a week and not feeling well and having pain in his left lower quadrant is abdomen. Patient denies any fever patient says he can't keep his medicines down. He says his belly pain is achy and moderate in nature. Patient says his Thursday March 2018 and President Alexander [name] is the president. Today is actually Sunday, 09/24/2015 Pres. Trump is president   Past Medical History  Diagnosis Date  . COPD (chronic obstructive pulmonary disease) ([place])   . Migraine   . Asthma   . BPH (benign prostatic hyperplasia)   . Deviated nasal septum   . DVT (deep venous thrombosis) ([place])   . Mantle cell lymphoma ([place])   . Erosive esophagitis     Patient Active Problem List   Diagnosis Date Noted  . Pressure ulcer 08/18/2015  . HCAP (healthcare-associated pneumonia) 08/17/2015  . Syncope and collapse 08/10/2015  . Syncope 08/10/2015  . Protein-calorie malnutrition, severe 08/10/2015  . Abdominal pain 07/17/2015  . Nausea & vomiting 07/17/2015  . Malnutrition of moderate degree 07/17/2015  . Diarrhea 05/18/2015  . DEVIATED SEPTUM 05/18/2007  . ALLERGIC RHINITIS 05/18/2007  . ASTHMA 05/18/2007    Past Surgical History  Procedure Laterality Date  . Throat surgery    . Barryx N/A   . Esophagogastroduodenoscopy (egd) with propofol N/A 02/23/2015    Procedure: ESOPHAGOGASTRODUODENOSCOPY (EGD) WITH PROPOFOL;  Surgeon: Hulen Luster, MD;  Location:  [facility] ENDOSCOPY;  Service: Gastroenterology;  Laterality: N/A;  . Esophagogastroduodenoscopy (egd) with propofol N/A 05/04/2015    Procedure: ESOPHAGOGASTRODUODENOSCOPY (EGD) WITH PROPOFOL;  Surgeon: Hulen Luster, MD;  Location: [facility] ENDOSCOPY;  Service: Gastroenterology;  Laterality: N/A;  . Tonsillectomy    . Esophagogastroduodenoscopy (egd) with propofol N/A 07/27/2015    Procedure: ESOPHAGOGASTRODUODENOSCOPY (EGD) WITH PROPOFOL;  Surgeon: Hulen Luster, MD;  Location: [facility] ENDOSCOPY;  Service: Gastroenterology;  Laterality: N/A;    Current Outpatient Rx  Name  Route  Sig  Dispense  Refill  . albuterol (PROAIR HFA) 108 (90 Base) MCG/ACT inhaler   Inhalation   Inhale 1-2 puffs into the lungs every 4 (four) hours as needed.         . docusate sodium (COLACE) 100 MG capsule   Oral   Take 200 mg by mouth 2 (two) times daily as needed for mild constipation.         . fluticasone (FLONASE) 50 MCG/ACT nasal spray   Each Nare   Place 2 sprays into both nostrils daily as needed for rhinitis.          . haloperidol (HALDOL) 0.5 MG tablet   Oral   Take 0.5 mg by mouth 4 (four) times daily as needed.         . loperamide (IMODIUM) 2 MG capsule   Oral   Take 2 mg by mouth as needed for diarrhea or loose stools.         Marland Kitchen morphine (MSIR) 30 MG tablet  Oral   Take 30 mg by mouth 3 (three) times daily.         Marland Kitchen OLANZapine (ZYPREXA) 5 MG tablet   Oral   Take 2.5 mg by mouth every 6 (six) hours as needed (for GI upset).          . Omeprazole (RA OMEPRAZOLE) 20 MG TBEC   Oral   Take 20 mg by mouth 2 (two) times daily.         Marland Kitchen senna (SENOKOT) 8.6 MG TABS tablet   Oral   Take 2 tablets by mouth 2 (two) times daily as needed for mild constipation.         . SUMAtriptan (IMITREX) 50 MG tablet   Oral   Take 50 mg by mouth every 2 (two) hours as needed for migraine.          . tamsulosin (FLOMAX) 0.4 MG CAPS capsule   Oral   Take 0.4 mg by mouth at bedtime.         .  traZODone (DESYREL) 50 MG tablet   Oral   Take 50 mg by mouth at bedtime.         . urea (CARMOL) 20 % cream   Topical   Apply 1 application topically as needed (for dry skin on feet).            Allergies Ativan and Bee venom  Family History  Problem Relation Age of Onset  . Mesothelioma Father   . Breast cancer Mother     Social History Social History  Substance Use Topics  . Smoking status: Former Research scientist (life sciences)  . Smokeless tobacco: None  . Alcohol Use: No    Review of Systems Constitutional: No fever/chills Eyes: No visual changes. ENT: No sore throat. Cardiovascular: Denies chest pain. Respiratory: Denies shortness of breath. Gastrointestinal: See history of present illness Genitourinary: Negative for dysuria. Musculoskeletal: Negative for back pain. Skin: Negative for rash. Neurological: Negative for headaches, focal weakness or numbness.  10-point ROS otherwise negative.  ____________________________________________   PHYSICAL EXAM:  VITAL SIGNS: ED Triage Vitals  Enc Vitals Group     BP 09/24/15 2039 137/81 mmHg     Pulse Rate 09/24/15 2039 62     Resp 09/24/15 2039 14     Temp 09/24/15 2039 98.5 F (36.9 C)     Temp Source 09/24/15 2039 Oral     SpO2 09/24/15 2039 97 %     Weight 09/24/15 2039 165 lb (74.844 kg)     Height 09/24/15 2039 6\' 2"  (1.88 m)     Head Cir --      Peak Flow --      Pain Score 09/24/15 2042 9     Pain Loc --      Pain Edu? --      Excl. in [place]? --     Constitutional: Alert  Well appearing and in no acute distress. Eyes: Conjunctivae are normal. PERRL. EOMI. Head: Atraumatic. Nose: No congestion/rhinnorhea. Mouth/Throat: Mucous membranes are moist.  Oropharynx non-erythematous. Neck: No stridor.   Cardiovascular: Normal rate, regular rhythm. Grossly normal heart sounds.  Good peripheral circulation. Respiratory: Normal respiratory effort.  No retractions. Lungs CTAB. Gastrointestinal: Soft and nontender except  mildly in the left lower quadrant to palpation only. No distention. No abdominal bruits. No CVA tenderness. Musculoskeletal: No lower extremity tenderness nor edema.  No joint effusions. Neurologic:  Normal speech and language. No gross focal neurologic deficits are appreciated. No gait instability. Skin:  Skin is warm, dry and intact. No rash noted. Psychiatric: Mood and affect are normal. Speech and behavior are normal.  ____________________________________________   LABS (all labs ordered are listed, but only abnormal results are displayed)  Labs Reviewed  COMPREHENSIVE METABOLIC PANEL - Abnormal; Notable for the following:    Potassium 3.1 (*)    ALT 10 (*)    All other components within normal limits  CBC WITH DIFFERENTIAL/PLATELET - Abnormal; Notable for the following:    Lymphs Abs 4.3 (*)    All other components within normal limits  URINALYSIS COMPLETEWITH MICROSCOPIC ([facility] ONLY) - Abnormal; Notable for the following:    Color, Urine YELLOW (*)    APPearance CLEAR (*)    Squamous Epithelial / LPF 0-5 (*)    All other components within normal limits  C DIFFICILE QUICK SCREEN W PCR REFLEX  TROPONIN I  LACTIC ACID, PLASMA  URINE DRUG SCREEN, QUALITATIVE ([facility] ONLY)  LACTIC ACID, PLASMA   ____________________________________________  EKG  EKG read and interpreted by me shows normal sinus rhythm at a rate of 60 left axis nonspecific changes irregular baseline ____________________________________________  RADIOLOGY  CT of the head read and interpreted by radiology no acute changes  CT of the abdomen read and interpreted by radiology shows no acute changes there is liquid stool in the colon ____________________________________________   PROCEDURES    ____________________________________________   INITIAL IMPRESSION / ASSESSMENT AND PLAN / ED COURSE  Pertinent labs & imaging results that were available during my care of the patient were reviewed by me and  considered in my medical decision making (see chart for details).  Family arrives and confirms history and history of present illness is much more confused today was recently in the hospital slept all day yesterday today confused unsteady walking bent over seeing double ____________________________________________   FINAL CLINICAL IMPRESSION(S) / ED DIAGNOSES  Final diagnoses:  Altered mental status, unspecified altered mental status type  Abdominal pain, unspecified abdominal location  Diarrhea, unspecified type      Nena Polio, MD 09/24/15 [phone]

## 2015-09-25 ENCOUNTER — Encounter: Payer: Self-pay | Admitting: Internal Medicine

## 2015-09-25 DIAGNOSIS — K529 Noninfective gastroenteritis and colitis, unspecified: Secondary | ICD-10-CM | POA: Diagnosis present

## 2015-09-25 DIAGNOSIS — E876 Hypokalemia: Secondary | ICD-10-CM | POA: Diagnosis present

## 2015-09-25 LAB — GASTROINTESTINAL PANEL BY PCR, STOOL (REPLACES STOOL CULTURE)
ADENOVIRUS F40/41: NOT DETECTED
ASTROVIRUS: NOT DETECTED
CAMPYLOBACTER SPECIES: NOT DETECTED
Cryptosporidium: NOT DETECTED
Cyclospora cayetanensis: NOT DETECTED
E. coli O157: NOT DETECTED
ENTAMOEBA HISTOLYTICA: NOT DETECTED
ENTEROAGGREGATIVE E COLI (EAEC): NOT DETECTED
ENTEROPATHOGENIC E COLI (EPEC): NOT DETECTED
ENTEROTOXIGENIC E COLI (ETEC): NOT DETECTED
GIARDIA LAMBLIA: NOT DETECTED
NOROVIRUS GI/GII: NOT DETECTED
PLESIMONAS SHIGELLOIDES: NOT DETECTED
Rotavirus A: NOT DETECTED
SHIGELLA/ENTEROINVASIVE E COLI (EIEC): NOT DETECTED
Salmonella species: NOT DETECTED
Sapovirus (I, II, IV, and V): NOT DETECTED
Shiga like toxin producing E coli (STEC): NOT DETECTED
VIBRIO CHOLERAE: NOT DETECTED
Vibrio species: NOT DETECTED
Yersinia enterocolitica: NOT DETECTED

## 2015-09-25 LAB — BASIC METABOLIC PANEL
Anion gap: 8 (ref 5–15)
BUN: 14 mg/dL (ref 6–20)
CHLORIDE: 103 mmol/L (ref 101–111)
CO2: 28 mmol/L (ref 22–32)
CREATININE: 0.85 mg/dL (ref 0.61–1.24)
Calcium: 8.2 mg/dL — ABNORMAL LOW (ref 8.9–10.3)
Glucose, Bld: 102 mg/dL — ABNORMAL HIGH (ref 65–99)
Potassium: 2.8 mmol/L — CL (ref 3.5–5.1)
SODIUM: 139 mmol/L (ref 135–145)

## 2015-09-25 LAB — POTASSIUM: POTASSIUM: 3.9 mmol/L (ref 3.5–5.1)

## 2015-09-25 LAB — CBC
HEMATOCRIT: 38 % — AB (ref 40.0–52.0)
Hemoglobin: 13.2 g/dL (ref 13.0–18.0)
MCH: 29.6 pg (ref 26.0–34.0)
MCHC: 34.8 g/dL (ref 32.0–36.0)
MCV: 85.1 fL (ref 80.0–100.0)
PLATELETS: 176 10*3/uL (ref 150–440)
RBC: 4.47 MIL/uL (ref 4.40–5.90)
RDW: 13.5 % (ref 11.5–14.5)
WBC: 7.9 10*3/uL (ref 3.8–10.6)

## 2015-09-25 LAB — MAGNESIUM: MAGNESIUM: 2 mg/dL (ref 1.7–2.4)

## 2015-09-25 LAB — C DIFFICILE QUICK SCREEN W PCR REFLEX
C DIFFICILE (CDIFF) INTERP: NEGATIVE
C DIFFICLE (CDIFF) ANTIGEN: NEGATIVE
C Diff toxin: NEGATIVE

## 2015-09-25 LAB — LACTIC ACID, PLASMA: LACTIC ACID, VENOUS: 0.9 mmol/L (ref 0.5–2.0)

## 2015-09-25 MED ORDER — ONDANSETRON HCL 4 MG/2ML IJ SOLN
4.0000 mg | Freq: Four times a day (QID) | INTRAMUSCULAR | Status: DC | PRN
  Administered 2015-09-25: 4 mg via INTRAVENOUS
  Filled 2015-09-25: qty 2

## 2015-09-25 MED ORDER — MORPHINE SULFATE (PF) 2 MG/ML IV SOLN
2.0000 mg | INTRAVENOUS | Status: DC | PRN
  Administered 2015-09-25 – 2015-09-26 (×8): 2 mg via INTRAVENOUS
  Filled 2015-09-25 (×8): qty 1

## 2015-09-25 MED ORDER — POTASSIUM CHLORIDE IN NACL 20-0.9 MEQ/L-% IV SOLN
INTRAVENOUS | Status: DC
Start: 2015-09-25 — End: 2015-09-26
  Administered 2015-09-25 – 2015-09-26 (×4): via INTRAVENOUS
  Filled 2015-09-25 (×7): qty 1000

## 2015-09-25 MED ORDER — UREA 20 % EX CREA
1.0000 "application " | TOPICAL_CREAM | CUTANEOUS | Status: DC | PRN
  Filled 2015-09-25: qty 85

## 2015-09-25 MED ORDER — HALOPERIDOL 0.5 MG PO TABS
0.5000 mg | ORAL_TABLET | Freq: Four times a day (QID) | ORAL | Status: DC | PRN
  Filled 2015-09-25: qty 1

## 2015-09-25 MED ORDER — ACETAMINOPHEN 650 MG RE SUPP: 650.0000 mg | Freq: Four times a day (QID) | RECTAL | Status: DC | PRN

## 2015-09-25 MED ORDER — SODIUM CHLORIDE 0.9% FLUSH
3.0000 mL | Freq: Two times a day (BID) | INTRAVENOUS | Status: DC
  Administered 2015-09-25 – 2015-09-26 (×3): 3 mL via INTRAVENOUS

## 2015-09-25 MED ORDER — POTASSIUM CHLORIDE 20 MEQ/15ML (10%) PO SOLN
40.0000 meq | Freq: Once | ORAL | Status: AC
  Administered 2015-09-25: 40 meq via ORAL
  Filled 2015-09-25: qty 30

## 2015-09-25 MED ORDER — PANTOPRAZOLE SODIUM 40 MG IV SOLR
40.0000 mg | INTRAVENOUS | Status: DC
  Administered 2015-09-25 – 2015-09-26 (×2): 40 mg via INTRAVENOUS
  Filled 2015-09-25 (×2): qty 40

## 2015-09-25 MED ORDER — ACETAMINOPHEN 325 MG PO TABS: 650.0000 mg | ORAL_TABLET | Freq: Four times a day (QID) | ORAL | Status: DC | PRN

## 2015-09-25 MED ORDER — FLUTICASONE PROPIONATE 50 MCG/ACT NA SUSP
2.0000 | Freq: Every day | NASAL | Status: DC | PRN
  Filled 2015-09-25: qty 16

## 2015-09-25 MED ORDER — TAMSULOSIN HCL 0.4 MG PO CAPS
0.4000 mg | ORAL_CAPSULE | Freq: Every day | ORAL | Status: DC
  Administered 2015-09-25 (×2): 0.4 mg via ORAL
  Filled 2015-09-25 (×2): qty 1

## 2015-09-25 MED ORDER — ONDANSETRON HCL 4 MG/2ML IJ SOLN
4.0000 mg | Freq: Once | INTRAMUSCULAR | Status: AC
  Administered 2015-09-25: 4 mg via INTRAVENOUS
  Filled 2015-09-25: qty 2

## 2015-09-25 MED ORDER — POTASSIUM CHLORIDE CRYS ER 20 MEQ PO TBCR
40.0000 meq | EXTENDED_RELEASE_TABLET | Freq: Once | ORAL | Status: AC
  Administered 2015-09-25: 40 meq via ORAL
  Filled 2015-09-25: qty 2

## 2015-09-25 MED ORDER — ENOXAPARIN SODIUM 40 MG/0.4ML ~~LOC~~ SOLN
40.0000 mg | Freq: Every day | SUBCUTANEOUS | Status: DC
  Administered 2015-09-25 (×2): 40 mg via SUBCUTANEOUS
  Filled 2015-09-25 (×2): qty 0.4

## 2015-09-25 MED ORDER — ALBUTEROL SULFATE (2.5 MG/3ML) 0.083% IN NEBU: 3.0000 mL | INHALATION_SOLUTION | RESPIRATORY_TRACT | Status: DC | PRN

## 2015-09-25 MED ORDER — ONDANSETRON HCL 4 MG PO TABS: 4.0000 mg | ORAL_TABLET | Freq: Four times a day (QID) | ORAL | Status: DC | PRN

## 2015-09-25 MED ORDER — MORPHINE SULFATE (PF) 2 MG/ML IV SOLN
2.0000 mg | Freq: Once | INTRAVENOUS | Status: AC
  Administered 2015-09-25: 2 mg via INTRAVENOUS
  Filled 2015-09-25: qty 1

## 2015-09-25 NOTE — Care Management (Signed)
Patient is  known to the [facility].  Says no one asked him about transfer in the ED.  He declines transfer.  Signed form.  H/P and form faxed to the [facility]

## 2015-09-25 NOTE — Progress Notes (Signed)
Initial Nutrition Assessment  DOCUMENTATION CODES:   Severe malnutrition in context of acute illness/injury  INTERVENTION:   Meals and Snacks: Cater to patient preferences; recommend smaller, more frequent meals. Pt agreeable to bedtime snack but declined morning or afternoon snack at this time Medical Food Supplement Therapy: discussed options regarding nutritional supplements; pt does not like/cannot tolerate Ensure, Boost, Carnation Instant Breakfast; pt had Magic Cup on his last admissions and does not want this either  NUTRITION DIAGNOSIS:   Inadequate oral intake related to nausea, vomiting as evidenced by per patient/family report.  GOAL:   Patient will meet greater than or equal to 90% of their needs  MONITOR:    (Energy Intake, Anthropometrics, Digestive System, Electrolyte/Renal Profile)  REASON FOR ASSESSMENT:   Malnutrition Screening Tool    ASSESSMENT:    Pt admitted with N/V/D; pt with hx of mantle cell lymphoma with last chemo over 1 year ago.   Past Medical History  Diagnosis Date  . COPD (chronic obstructive pulmonary disease) ([place])   . Migraine   . Asthma   . BPH (benign prostatic hyperplasia)   . Deviated nasal septum   . DVT (deep venous thrombosis) ([place])   . Mantle cell lymphoma ([place])   . Erosive esophagitis      Diet Order:  DIET SOFT Room service appropriate?: Yes; Fluid consistency:: Thin   Energy Intake: tolerated CL breakfast tray, ate of of the open roast beef sandwich and mashed potatoes at lunch and dinner  Digestive System: pt reports chronic N/V, reports hx of this for several years. Pt reports he did tolerate his solid lunch meal today. Pt also reports difficulty swallowing at times; pt has reports hx of Barrett's esophagus and reports he has a surgery planned in the near future  Food and Nutrition Related History: pt reports chronic N/V limiting po intake. Pt reports he barely eats 3 meals per day, no snacks in between and this has  been going on for a while. Reports a meal might consist of 1/2 sandwich (reports he can make 2 meals out of a quarter pounder); no supplements. Very minimal po intake (bites/sips) due to N/V for 5 days prior to admission  Electrolyte and Renal Profile:  Recent Labs Lab 09/22/15 1321 09/24/15 2118 09/25/15 0407 09/25/15 1515  BUN 11 19 14   --   CREATININE 0.79 0.90 0.85  --   NA 137 140 139  --   K 2.9* 3.1* 2.8* 3.9  MG  --   --  2.0  --    Glucose Profile: No results for input(s): GLUCAP in the last 72 hours.  Last BM:  09/25/15  Cdiff negative   Nutrition Focused Physical Exam: Nutrition-Focused physical exam completed. Findings are mild fat depletion, mild muscle depletion, and no edema.     Height:   Ht Readings from Last 1 Encounters:  09/24/15 6\' 2"  (1.88 m)    Weight: 7.7% wt loss in <2 months per weight encounters; pt reports significant wt loss. Reports he loses 5-10 pounds per week, reports he weighed 178 pounds last week (6.7% wt loss). Pt reports he has lost >100 pounds over several years.   Wt Readings from Last 1 Encounters:  09/25/15 166 lb 9.6 oz (75.569 kg)    Wt Readings from Last 10 Encounters:   09/25/15 166 lb 9.6 oz (75.569 kg)   09/22/15 165 lb (74.844 kg)   08/17/15 180 lb 3.2 oz (81.738 kg)   08/10/15 180 lb (81.647 kg)  08/10/15 180 lb (81.647 kg)   08/09/15 180 lb (81.647 kg)   07/27/15 180 lb (81.647 kg)   07/17/15 183 lb (83.008 kg)   07/04/15 186 lb (84.369 kg)   05/18/15 185 lb 1.6 oz (83.961 kg)   BMI:  Body mass index is 21.38 kg/(m^2).  Estimated Nutritional Needs:   Kcal:  [id number] kcals (BEE 1635, 1.3 AF, 1.1-1.2 IF)   Protein:  76-91 g (1.0-1.2 g/kg)   Fluid:  1900-2280 mL (25-30 ml/kg)   HIGH Care Level   Kerman Passey MS, RD, LDN [phone] Pager  [phone] Weekend/On-Call Pager

## 2015-09-25 NOTE — Progress Notes (Signed)
Patient is admitted to room 256 with a diagnosis of gastroenteritis. Alert and oriented x 4. Denied any acute pain. No respiratory distress noted. Skin assessment done with Doretha Imus RN, no skin conditions of concern noted. Patient oriented to her room, staff, call bell/ascom. Fall Neurosurgeon. Tele box verified by two employees. Patient was medicated with Morphine 2 mg PRN for abdominal and bone pain. On re-assessment, patient denied pain. Will continue to monitor.

## 2015-09-25 NOTE — Progress Notes (Signed)
Potassium this morning 2.8, Dr. Estanislado Pandy notified with a  New order for new  for Potassium 40 mEq oral x  1 dose and recheck Potassium level at 1400. Will continue to monitor.

## 2015-09-25 NOTE — Plan of Care (Signed)
Problem: Nutrition Goal: Patient maintains adequate hydration Outcome: Progressing Patient is getting NS with 20 KCL at 125 mL/hr.

## 2015-09-25 NOTE — H&P (Signed)
[place] [facility] [place] NAME: Alexander [name]    MR#:  [id number]  DATE OF BIRTH:  [birth year]  DATE OF ADMISSION:  09/24/2015  PRIMARY CARE PHYSICIAN: Perrin Maltese, MD   REQUESTING/REFERRING PHYSICIAN:   CHIEF COMPLAINT:   Chief Complaint  Patient presents with  . Altered Mental Status    HISTORY OF PRESENT ILLNESS: Alexander [name]  is a 60 y.o. male with a known history of COPD, migraine, bronchial asthma, benign prostatic hypertrophy, Mantle cell lymphoma presented to the emergency room for confusion. Patient was brought by the family to the ER for change in mental status. Patient is awake and alert and responds to all commands and oriented to time place and person. Patient says he has abdominal discomfort for the last 2-3 days. He also has nausea and vomiting which is present for more than 4 days. Vomitus contains food and water particles. Patient has had diarrhea for the last 3 days. Diarrhea is watery stool. No blood in the stool or in the vomitus. Abdominal pain is aching in nature located around the umbilicus and in the lower abdomen. No history of hematemesis or hemoptysis or any rectal bleeding. No history of recent travel or any sick contacts at home. Patient was worked up in the ER CT abdomen did not show any acute abnormality, potassium level was low and patient was dry and dehydrated. No history of fever or chills. No complaints of cough or chest pain. Hospitalist service was consulted for further care patient. Patient also presented to the emergency room couple of days ago for similar complaints. Patient also felt weak and tired and was about to pass out yesterday.  PAST MEDICAL HISTORY:   Past Medical History  Diagnosis Date  . COPD (chronic obstructive pulmonary disease) ([place])   . Migraine   . Asthma   . BPH (benign prostatic hyperplasia)   . Deviated nasal septum   . DVT (deep venous thrombosis) ([place])   . Mantle cell lymphoma  ([place])   . Erosive esophagitis     PAST SURGICAL HISTORY: Past Surgical History  Procedure Laterality Date  . Throat surgery    . Barryx N/A   . Esophagogastroduodenoscopy (egd) with propofol N/A 02/23/2015    Procedure: ESOPHAGOGASTRODUODENOSCOPY (EGD) WITH PROPOFOL;  Surgeon: Hulen Luster, MD;  Location: [facility] ENDOSCOPY;  Service: Gastroenterology;  Laterality: N/A;  . Esophagogastroduodenoscopy (egd) with propofol N/A 05/04/2015    Procedure: ESOPHAGOGASTRODUODENOSCOPY (EGD) WITH PROPOFOL;  Surgeon: Hulen Luster, MD;  Location: [facility] ENDOSCOPY;  Service: Gastroenterology;  Laterality: N/A;  . Tonsillectomy    . Esophagogastroduodenoscopy (egd) with propofol N/A 07/27/2015    Procedure: ESOPHAGOGASTRODUODENOSCOPY (EGD) WITH PROPOFOL;  Surgeon: Hulen Luster, MD;  Location: [facility] ENDOSCOPY;  Service: Gastroenterology;  Laterality: N/A;    SOCIAL HISTORY:  Social History  Substance Use Topics  . Smoking status: Former Research scientist (life sciences)  . Smokeless tobacco: Not on file  . Alcohol Use: No    FAMILY HISTORY:  Family History  Problem Relation Age of Onset  . Mesothelioma Father   . Breast cancer Mother     DRUG ALLERGIES:  Allergies  Allergen Reactions  . Ativan [Lorazepam] Other (See Comments)    Reaction:  Dizziness   . Bee Venom     REVIEW OF SYSTEMS:   CONSTITUTIONAL: No fever, has weakness.  EYES: No blurred or double vision.  EARS, NOSE, AND THROAT: No tinnitus or ear pain.  RESPIRATORY: No cough, shortness of breath,  wheezing or hemoptysis.  CARDIOVASCULAR: No chest pain, orthopnea, edema.  GASTROINTESTINAL: Has nausea, vomiting, Has diarrhea and abdominal pain.  GENITOURINARY: No dysuria, hematuria.  ENDOCRINE: No polyuria, nocturia,  HEMATOLOGY: No anemia, easy bruising or bleeding SKIN: No rash or lesion. MUSCULOSKELETAL: No joint pain or arthritis.   NEUROLOGIC: No tingling, numbness, weakness.  PSYCHIATRY: No anxiety or depression.   MEDICATIONS AT HOME:  Prior to Admission  medications   Medication Sig Start Date End Date Taking? Authorizing Provider  albuterol (PROAIR HFA) 108 (90 Base) MCG/ACT inhaler Inhale 1-2 puffs into the lungs every 4 (four) hours as needed.   Yes Historical Provider, MD  docusate sodium (COLACE) 100 MG capsule Take 200 mg by mouth 2 (two) times daily as needed for mild constipation.   Yes Historical Provider, MD  fluticasone (FLONASE) 50 MCG/ACT nasal spray Place 2 sprays into both nostrils daily as needed for rhinitis.    Yes Historical Provider, MD  haloperidol (HALDOL) 0.5 MG tablet Take 0.5 mg by mouth 4 (four) times daily as needed.   Yes Historical Provider, MD  loperamide (IMODIUM) 2 MG capsule Take 2 mg by mouth as needed for diarrhea or loose stools.   Yes Historical Provider, MD  morphine (MSIR) 30 MG tablet Take 30 mg by mouth 3 (three) times daily.   Yes Historical Provider, MD  OLANZapine (ZYPREXA) 5 MG tablet Take 2.5 mg by mouth every 6 (six) hours as needed (for GI upset).    Yes Historical Provider, MD  Omeprazole (RA OMEPRAZOLE) 20 MG TBEC Take 20 mg by mouth 2 (two) times daily.   Yes Historical Provider, MD  senna (SENOKOT) 8.6 MG TABS tablet Take 2 tablets by mouth 2 (two) times daily as needed for mild constipation.   Yes Historical Provider, MD  SUMAtriptan (IMITREX) 50 MG tablet Take 50 mg by mouth every 2 (two) hours as needed for migraine.    Yes Historical Provider, MD  tamsulosin (FLOMAX) 0.4 MG CAPS capsule Take 0.4 mg by mouth at bedtime.   Yes Historical Provider, MD  traZODone (DESYREL) 50 MG tablet Take 50 mg by mouth at bedtime.   Yes Historical Provider, MD  urea (CARMOL) 20 % cream Apply 1 application topically as needed (for dry skin on feet).    Yes Historical Provider, MD      PHYSICAL EXAMINATION:   VITAL SIGNS: Blood pressure 101/79, pulse 58, temperature 98.5 F (36.9 C), temperature source Oral, resp. rate 20, height 6\' 2"  (1.88 m), weight 74.844 kg (165 lb), SpO2 97 %.  GENERAL:  60  y.o.-year-old patient lying in the bed with no acute distress.  EYES: Pupils equal, round, reactive to light and accommodation. No scleral icterus. Extraocular muscles intact.  HEENT: Head atraumatic, normocephalic. Oropharynx dry and nasopharynx clear.  NECK:  Supple, no jugular venous distention. No thyroid enlargement, no tenderness.  LUNGS: Normal breath sounds bilaterally, no wheezing, rales,rhonchi or crepitation. No use of accessory muscles of respiration.  CARDIOVASCULAR: S1, S2 normal. No murmurs, rubs, or gallops.  ABDOMEN: Soft, mild tenderness around umbilicus, nondistended. Bowel sounds present. No organomegaly or mass.  EXTREMITIES: No pedal edema, cyanosis, or clubbing.  NEUROLOGIC: Cranial nerves II through XII are intact. Muscle strength 5/5 in all extremities. Sensation intact. No cerebellar signs noted. PSYCHIATRIC: The patient is alert and oriented x 3.  SKIN: No obvious rash, lesion, or ulcer.   LABORATORY PANEL:   CBC  Recent Labs Lab 09/20/15 1336 09/22/15 1321 09/24/15 2118  WBC 6.0 7.6  10.0  HGB 15.3 14.9 14.7  HCT 44.0 43.2 42.6  PLT 174 198 208  MCV 84.8 84.7 85.4  MCH 29.6 29.1 29.5  MCHC 34.9 34.4 34.5  RDW 13.6 13.5 13.7  LYMPHSABS  --   --  4.3*  MONOABS  --   --  1.0  EOSABS  --   --  0.2  BASOSABS  --   --  0.1   ------------------------------------------------------------------------------------------------------------------  Chemistries   Recent Labs Lab 09/20/15 1336 09/20/15 1818 09/22/15 1321 09/24/15 2118  NA 138  --  137 140  K 3.5  --  2.9* 3.1*  CL 102  --  99* 103  CO2 29  --  29 29  GLUCOSE 120*  --  118* 74  BUN 18  --  11 19  CREATININE 0.71  --  0.79 0.90  CALCIUM 9.5  --  9.2 9.2  AST  --  16 20 16   ALT  --  12* 12* 10*  ALKPHOS  --  102 109 101  BILITOT  --  1.4* 1.2 0.8   ------------------------------------------------------------------------------------------------------------------ estimated creatinine  clearance is 91.2 mL/min (by C-G formula based on Cr of 0.9). ------------------------------------------------------------------------------------------------------------------ No results for input(s): TSH, T4TOTAL, T3FREE, THYROIDAB in the last 72 hours.  Invalid input(s): FREET3   Coagulation profile No results for input(s): INR, PROTIME in the last 168 hours. ------------------------------------------------------------------------------------------------------------------- No results for input(s): DDIMER in the last 72 hours. -------------------------------------------------------------------------------------------------------------------  Cardiac Enzymes  Recent Labs Lab 09/20/15 1818 09/22/15 1321 09/24/15 2118  TROPONINI <0.03 <0.03 <0.03   ------------------------------------------------------------------------------------------------------------------ Invalid input(s): POCBNP  ---------------------------------------------------------------------------------------------------------------  Urinalysis    Component Value Date/Time   COLORURINE YELLOW* 09/24/2015 2119   COLORURINE Yellow 11/27/2014 1918   APPEARANCEUR CLEAR* 09/24/2015 2119   APPEARANCEUR Clear 11/27/2014 1918   LABSPEC 1.023 09/24/2015 2119   LABSPEC 1.017 11/27/2014 1918   PHURINE 5.0 09/24/2015 2119   PHURINE 7.0 11/27/2014 1918   GLUCOSEU NEGATIVE 09/24/2015 2119   GLUCOSEU Negative 11/27/2014 1918   HGBUR NEGATIVE 09/24/2015 2119   HGBUR Negative 11/27/2014 1918   BILIRUBINUR NEGATIVE 09/24/2015 2119   BILIRUBINUR Negative 11/27/2014 1918   KETONESUR NEGATIVE 09/24/2015 2119   KETONESUR Negative 11/27/2014 1918   PROTEINUR NEGATIVE 09/24/2015 2119   PROTEINUR Negative 11/27/2014 1918   NITRITE NEGATIVE 09/24/2015 2119   NITRITE Negative 11/27/2014 1918   LEUKOCYTESUR NEGATIVE 09/24/2015 2119   LEUKOCYTESUR Negative 11/27/2014 1918     RADIOLOGY: Ct Head Wo Contrast  09/24/2015   CLINICAL DATA:  Weakness and confusion, recurrent falls, visual hallucinations. History of tremor, lymphoma, chronic abdominal pain. EXAM: CT HEAD WITHOUT CONTRAST TECHNIQUE: Contiguous axial images were obtained from the base of the skull through the vertex without intravenous contrast. COMPARISON:  CT head September 22, 2015 FINDINGS: Mild ventriculomegaly on the basis of global parenchymal brain volume loss as there is overall commensurate enlargement of cerebral sulci and cerebellar folia. Mild patchy supratentorial white matter hypodensities without midline shift, mass effect, intraparenchymal hemorrhage or acute large vascular territory infarcts. No abnormal extra-axial fluid collections. Basal cisterns are patent. Mild calcific atherosclerosis of the carotid siphons. Dehiscent cartilaginous nasal septum can be seen with chronic drug use. Mild paranasal sinus mucosal thickening without air-fluid levels. The mastoid air cells are well aerated. No skull fracture. IMPRESSION: No acute intracranial process. Mild global parenchymal brain volume loss for age and mild chronic small vessel ischemic disease. Electronically Signed   By: Elon Alas M.D.   On: 09/24/2015 22:57   Ct  Abdomen Pelvis W Contrast  09/24/2015  CLINICAL DATA:  60 year old male with left lower quadrant abdominal pain and vomiting. EXAM: CT ABDOMEN AND PELVIS WITH CONTRAST TECHNIQUE: Multidetector CT imaging of the abdomen and pelvis was performed using the standard protocol following bolus administration of intravenous contrast. CONTRAST:  195mL OMNIPAQUE IOHEXOL 300 MG/ML  SOLN COMPARISON:  CT dated 08/17/2015 FINDINGS: The visualized lung bases are clear. No intra-abdominal free air or free fluid. The liver, gallbladder, pancreas, spleen, and the right adrenal gland appear unremarkable. A 1.2 cm left adrenal probable adenoma. The kidneys, visualized ureters appear unremarkable. Stable appearing right renal inferior pole hypodense lesion  is incompletely characterized but likely represents a cyst. There is apparent diffuse thickening of the bladder wall which may be partly related to underdistention. Cystitis is not excluded. Correlation with urinalysis recommended. The prostate gland and seminal vesicles are grossly unremarkable. There is scattered sigmoid and colonic diverticula without active inflammatory changes. Segmental area of apparent thickening in the sigmoid colon in the left hemipelvis likely related to chronic inflammation and muscular hypertrophy as well as under distention of the bowel. Loose stool noted throughout the colon compatible with diarrheal state. No evidence of bowel obstruction. Normal appendix. Mild aortoiliac atherosclerotic disease. No portal venous gas. No adenopathy. The abdominal wall soft tissues appear unremarkable. The osseous structures are intact. IMPRESSION: Diarrheal state. Correlation with clinical exam and stool cultures recommended. Diverticulosis.  No evidence of bowel obstruction or inflammation. Electronically Signed   By: Anner Crete M.D.   On: 09/24/2015 23:01   Dg Chest Portable 1 View  09/24/2015  CLINICAL DATA:  Acute onset of generalized weakness and confusion. Multiple recent falls. Syncope. Initial encounter. EXAM: PORTABLE CHEST 1 VIEW COMPARISON:  Chest radiograph and CTA of the chest performed 09/20/2015 FINDINGS: The lungs are well-aerated and clear. There is no evidence of focal opacification, pleural effusion or pneumothorax. A calcified granuloma is noted at the right upper lung zone. The cardiomediastinal silhouette is within normal limits. No acute osseous abnormalities are seen. IMPRESSION: No acute cardiopulmonary process seen. No displaced rib fractures identified. Electronically Signed   By: Garald Balding M.D.   On: 09/24/2015 22:05    EKG: Orders placed or performed during the hospital encounter of 09/24/15  . ED EKG  . ED EKG  . EKG 12-Lead  . EKG 12-Lead     IMPRESSION AND PLAN: 60 year old male patient with history of COPD, migraine, Mantle cell lymphoma, bronchial asthma, benign prostatic hypertrophy presented to the emergency room with abdominal discomfort, nausea and vomiting. Patient's potassium level is low on the workup. CT abdomen no acute pathology. Admitting diagnosis 1. Hypokalemia 2. Intractable nausea vomiting 3. Acute gastroenteritis 4. Near syncope probably secondary to vasovagal etiology 5. Dehydration Treatment plan Admit patient to telemetry under observation bed IV fluid hydration Replace potassium Check stool culture and stool for C. difficile PCR reflex Antiemetics Clear liquid diet and once nausea and vomiting under control advance diet All the records are reviewed and case discussed with ED provider. Management plans discussed with the patient, family and they are in agreement.  CODE STATUS:FULL Code Status History    Date Active Date Inactive Code Status Order ID Comments User Context   08/17/2015 10:03 PM 08/19/2015  2:23 PM Full Code [id number]  Gladstone Lighter, MD Inpatient   08/10/2015  8:28 AM 08/12/2015  1:40 PM Full Code [id number]  Saundra Shelling, MD Inpatient   07/17/2015  1:08 AM 07/19/2015  2:13 PM Full Code [id number]  Saundra Shelling, MD Inpatient   05/18/2015  1:29 PM 05/19/2015  3:02 PM Full Code [id number]  Dustin Flock, MD Inpatient       TOTAL TIME TAKING CARE OF THIS PATIENT: 79minutes.    Saundra Shelling M.D on 09/25/2015 at 12:15 AM  Between 7am to 6pm - Pager - [phone]  After 6pm go to [email or web address] - password [email or web address] [facility]  Office  [phone]  CC: Primary care physician; Perrin Maltese, MD

## 2015-09-25 NOTE — Progress Notes (Signed)
[place] [facility] [place] NAME: Ibrohim Buccieri    MR#:  [id number]  DATE OF BIRTH:  1956-01-18  SUBJECTIVE:  Patient here with nausea and vomiting and diarrhea for the past few days.  REVIEW OF SYSTEMS:    Review of Systems  Constitutional: Negative for fever, chills and malaise/fatigue.  HENT: Negative for sore throat.   Eyes: Negative for blurred vision.  Respiratory: Negative for cough, hemoptysis, shortness of breath and wheezing.   Cardiovascular: Negative for chest pain, palpitations and leg swelling.  Gastrointestinal: Positive for nausea, vomiting and diarrhea. Negative for abdominal pain and blood in stool.  Genitourinary: Negative for dysuria.  Musculoskeletal: Negative for back pain.  Neurological: Negative for dizziness, tremors and headaches.  Endo/Heme/Allergies: Does not bruise/bleed easily.    Tolerating Diet: Yes clear liquid      DRUG ALLERGIES:   Allergies  Allergen Reactions  . Ativan [Lorazepam] Other (See Comments)    Reaction:  Dizziness   . Bee Venom     VITALS:  Blood pressure 101/57, pulse 54, temperature 98.1 F (36.7 C), temperature source Oral, resp. rate 18, height 6\' 2"  (1.88 m), weight 75.569 kg (166 lb 9.6 oz), SpO2 98 %.  PHYSICAL EXAMINATION:   Physical Exam  Constitutional: He is oriented to person, place, and time and well-developed, well-nourished, and in no distress. No distress.  HENT:  Head: Normocephalic.  Eyes: No scleral icterus.  Neck: Normal range of motion. Neck supple. No JVD present. No tracheal deviation present.  Cardiovascular: Normal rate, regular rhythm and normal heart sounds.  Exam reveals no gallop and no friction rub.   No murmur heard. Pulmonary/Chest: Effort normal and breath sounds normal. No respiratory distress. He has no wheezes. He has no rales. He exhibits no tenderness.  Abdominal: Soft. Bowel sounds are normal. He exhibits no distension and no mass.  There is no tenderness. There is no rebound and no guarding.  Musculoskeletal: Normal range of motion. He exhibits no edema.  Neurological: He is alert and oriented to person, place, and time.  Skin: Skin is warm. No rash noted. No erythema.  Psychiatric: Affect and judgment normal.      LABORATORY PANEL:   CBC  Recent Labs Lab 09/25/15 0407  WBC 7.9  HGB 13.2  HCT 38.0*  PLT 176   ------------------------------------------------------------------------------------------------------------------  Chemistries   Recent Labs Lab 09/24/15 2118 09/25/15 0407  NA 140 139  K 3.1* 2.8*  CL 103 103  CO2 29 28  GLUCOSE 74 102*  BUN 19 14  CREATININE 0.90 0.85  CALCIUM 9.2 8.2*  MG  --  2.0  AST 16  --   ALT 10*  --   ALKPHOS 101  --   BILITOT 0.8  --    ------------------------------------------------------------------------------------------------------------------  Cardiac Enzymes  Recent Labs Lab 09/20/15 1818 09/22/15 1321 09/24/15 2118  TROPONINI <0.03 <0.03 <0.03   ------------------------------------------------------------------------------------------------------------------  RADIOLOGY:  Ct Head Wo Contrast  09/24/2015  CLINICAL DATA:  Weakness and confusion, recurrent falls, visual hallucinations. History of tremor, lymphoma, chronic abdominal pain. EXAM: CT HEAD WITHOUT CONTRAST TECHNIQUE: Contiguous axial images were obtained from the base of the skull through the vertex without intravenous contrast. COMPARISON:  CT head September 22, 2015 FINDINGS: Mild ventriculomegaly on the basis of global parenchymal brain volume loss as there is overall commensurate enlargement of cerebral sulci and cerebellar folia. Mild patchy supratentorial white matter hypodensities without midline shift, mass effect, intraparenchymal hemorrhage or acute large vascular  territory infarcts. No abnormal extra-axial fluid collections. Basal cisterns are patent. Mild calcific  atherosclerosis of the carotid siphons. Dehiscent cartilaginous nasal septum can be seen with chronic drug use. Mild paranasal sinus mucosal thickening without air-fluid levels. The mastoid air cells are well aerated. No skull fracture. IMPRESSION: No acute intracranial process. Mild global parenchymal brain volume loss for age and mild chronic small vessel ischemic disease. Electronically Signed   By: Elon Alas M.D.   On: 09/24/2015 22:57   Ct Abdomen Pelvis W Contrast  09/24/2015  CLINICAL DATA:  60 year old male with left lower quadrant abdominal pain and vomiting. EXAM: CT ABDOMEN AND PELVIS WITH CONTRAST TECHNIQUE: Multidetector CT imaging of the abdomen and pelvis was performed using the standard protocol following bolus administration of intravenous contrast. CONTRAST:  186mL OMNIPAQUE IOHEXOL 300 MG/ML  SOLN COMPARISON:  CT dated 08/17/2015 FINDINGS: The visualized lung bases are clear. No intra-abdominal free air or free fluid. The liver, gallbladder, pancreas, spleen, and the right adrenal gland appear unremarkable. A 1.2 cm left adrenal probable adenoma. The kidneys, visualized ureters appear unremarkable. Stable appearing right renal inferior pole hypodense lesion is incompletely characterized but likely represents a cyst. There is apparent diffuse thickening of the bladder wall which may be partly related to underdistention. Cystitis is not excluded. Correlation with urinalysis recommended. The prostate gland and seminal vesicles are grossly unremarkable. There is scattered sigmoid and colonic diverticula without active inflammatory changes. Segmental area of apparent thickening in the sigmoid colon in the left hemipelvis likely related to chronic inflammation and muscular hypertrophy as well as under distention of the bowel. Loose stool noted throughout the colon compatible with diarrheal state. No evidence of bowel obstruction. Normal appendix. Mild aortoiliac atherosclerotic disease. No  portal venous gas. No adenopathy. The abdominal wall soft tissues appear unremarkable. The osseous structures are intact. IMPRESSION: Diarrheal state. Correlation with clinical exam and stool cultures recommended. Diverticulosis.  No evidence of bowel obstruction or inflammation. Electronically Signed   By: Anner Crete M.D.   On: 09/24/2015 23:01   Dg Chest Portable 1 View  09/24/2015  CLINICAL DATA:  Acute onset of generalized weakness and confusion. Multiple recent falls. Syncope. Initial encounter. EXAM: PORTABLE CHEST 1 VIEW COMPARISON:  Chest radiograph and CTA of the chest performed 09/20/2015 FINDINGS: The lungs are well-aerated and clear. There is no evidence of focal opacification, pleural effusion or pneumothorax. A calcified granuloma is noted at the right upper lung zone. The cardiomediastinal silhouette is within normal limits. No acute osseous abnormalities are seen. IMPRESSION: No acute cardiopulmonary process seen. No displaced rib fractures identified. Electronically Signed   By: Garald Balding M.D.   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   1. Intractable nausea and vomiting: Patient symptoms have subsided. I suspect this is viral in etiology. Advance diet as tolerated.  2. Diarrhea: Again this is likely viral in etiology. I have ordered C. difficile and stool culture.  3. Hypokalemia: This is from nausea vomiting diarrhea. Potassium will be repleted.  4. Near syncope: This is vasovagal in etiology due to problem #1 and 2.  5. BPH: Continue Flomax.  Management plans discussed with the patient and he is in agreement.  CODE STATUS: FULL  TOTAL TIME TAKING CARE OF THIS PATIENT: 30 minutes.  POSSIBLE D/C tomorrow, DEPENDING ON CLINICAL CONDITION.   Nyasha Rahilly  M.D on 09/25/2015 at 11:04 AM  Between 7am to 6pm - Pager - [phone] After 6pm go to [email or web address] - password [email or web address] [facility]  Office  [phone]  CC: Primary care physician; Perrin Maltese, MD  Note: This dictation was prepared with Dragon dictation along with smaller phrase technology. Any transcriptional errors that result from this process are unintentional.

## 2015-09-25 NOTE — Progress Notes (Signed)
Pt alert and oriented x4, no complaints of pain or discomfort.  Bed in low position, call bell within reach.  Bed alarms on and functioning.  Assessment done and charted.  Will continue to monitor and do hourly rounding throughout the shift Pain noted  5 /10. Pt refuses any pain med at this time, will continue to monitor

## 2015-09-25 NOTE — Progress Notes (Signed)
Current patient followed by [facility] and physical therapy.  Patient was admitted 2.5.17 with abdominal pain and nausea, vomiting.  Please see transfer report below.  Updated hospital information faxed to referral intake.  Liaison will continue through final disposition.      Transfer Report                                                                                                                                      Date : 09/25/2015   Patient Name: Alexander [name]                                             DOB:  1955-10-17 Emergency Contact:  Dellia Cloud                           Phone Number: [phone] Physician:  Dr. Niger Reid                        MD Fax: [phone]            MD phone number: [phone]  Patient is receiving the following Home Health Services:  Skilled Nursing           x Physical Therapy        Occupational Therapy                  x Social Work                     Theme park manager [place] Transferred to:  x [facility] room [place]         ALF                                Other:         Date of Transfer:     09/24/15  Report given to:             Phone Call       Agency Liaison Personnel            Fax  Email Name:  Doreatha Lew, RN Date:  09/25/15  Reason for Transfer:  Medication side effects, toxicity, anaphylaxis                      Injury caused by fall                                    Respiratory Infection          Other  respiratory problem                                                   Heart Failure                                              Cardiac dysrhythmia  Myocardial infarction or Chest Pain                                     other heart disease                                    Stroke (CVA) or TIA             Hypo/Hyperglycemia,  diabetes out of control                        GI bleeding/obstruction                            Dehydration, Malnutrition  Urinary tract infection                                                             Uncontrolled Pain                                    Wound infection or deterioration     IV catheter-related infection or complication                         DVT, pulmonary embolus                        Acute Mental/behavioral health problems                              Other:   Nausea, vomiting, abdominal pain        Relevant Diagnosis related to transfer:   Barretts Esophagus without Dysplasia, Mantle Cell Lymphoma and drop in blood pressure with change in position.  Current Status (Brief narrative related to the transfer):  Received e-mail from Triage that patients wife,  [name] called to say that they would not be at home for PT visit today because patient went to ED last night and was admitted to hospital.  Recommendations for follow up care (include needs that cannot be met in the home): SNF or Hospice Services if appropriate.   Reporting Clinician:  Maryruth Hancock, PT

## 2015-09-26 ENCOUNTER — Emergency Department (HOSPITAL_COMMUNITY)
Admission: EM | Admit: 2015-09-26 | Discharge: 2015-09-26 | Disposition: A | Discharge status: Home / Self Care | Payer: Medicaid Other | Attending: Emergency Medicine | Admitting: Emergency Medicine

## 2015-09-26 ENCOUNTER — Telehealth: Payer: Self-pay

## 2015-09-26 ENCOUNTER — Encounter (HOSPITAL_COMMUNITY): Payer: Self-pay

## 2015-09-26 DIAGNOSIS — G43909 Migraine, unspecified, not intractable, without status migrainosus: Secondary | ICD-10-CM | POA: Insufficient documentation

## 2015-09-26 DIAGNOSIS — Z8719 Personal history of other diseases of the digestive system: Secondary | ICD-10-CM | POA: Diagnosis not present

## 2015-09-26 DIAGNOSIS — Z8572 Personal history of non-Hodgkin lymphomas: Secondary | ICD-10-CM | POA: Diagnosis not present

## 2015-09-26 DIAGNOSIS — N400 Enlarged prostate without lower urinary tract symptoms: Secondary | ICD-10-CM | POA: Insufficient documentation

## 2015-09-26 DIAGNOSIS — Z87891 Personal history of nicotine dependence: Secondary | ICD-10-CM | POA: Insufficient documentation

## 2015-09-26 DIAGNOSIS — J449 Chronic obstructive pulmonary disease, unspecified: Secondary | ICD-10-CM | POA: Diagnosis not present

## 2015-09-26 DIAGNOSIS — R410 Disorientation, unspecified: Secondary | ICD-10-CM | POA: Diagnosis not present

## 2015-09-26 DIAGNOSIS — Z79899 Other long term (current) drug therapy: Secondary | ICD-10-CM | POA: Diagnosis not present

## 2015-09-26 DIAGNOSIS — Z86718 Personal history of other venous thrombosis and embolism: Secondary | ICD-10-CM | POA: Diagnosis not present

## 2015-09-26 DIAGNOSIS — Z79891 Long term (current) use of opiate analgesic: Secondary | ICD-10-CM | POA: Insufficient documentation

## 2015-09-26 DIAGNOSIS — R55 Syncope and collapse: Secondary | ICD-10-CM | POA: Diagnosis present

## 2015-09-26 LAB — CBG MONITORING, ED: GLUCOSE-CAPILLARY: 117 mg/dL — AB (ref 65–99)

## 2015-09-26 LAB — CBC
HCT: 40.4 % (ref 39.0–52.0)
HEMOGLOBIN: 14.1 g/dL (ref 13.0–17.0)
MCH: 29.4 pg (ref 26.0–34.0)
MCHC: 34.9 g/dL (ref 30.0–36.0)
MCV: 84.3 fL (ref 78.0–100.0)
PLATELETS: 170 10*3/uL (ref 150–400)
RBC: 4.79 MIL/uL (ref 4.22–5.81)
RDW: 12.5 % (ref 11.5–15.5)
WBC: 6.3 10*3/uL (ref 4.0–10.5)

## 2015-09-26 LAB — TSH: TSH: 1.344 u[IU]/mL (ref 0.350–4.500)

## 2015-09-26 LAB — URINALYSIS, ROUTINE W REFLEX MICROSCOPIC
Bilirubin Urine: NEGATIVE
Glucose, UA: NEGATIVE mg/dL
Hgb urine dipstick: NEGATIVE
Ketones, ur: NEGATIVE mg/dL
LEUKOCYTES UA: NEGATIVE
NITRITE: NEGATIVE
PROTEIN: NEGATIVE mg/dL
SPECIFIC GRAVITY, URINE: 1.018 (ref 1.005–1.030)
pH: 5 (ref 5.0–8.0)

## 2015-09-26 LAB — BASIC METABOLIC PANEL
ANION GAP: 14 (ref 5–15)
Anion gap: 2 — ABNORMAL LOW (ref 5–15)
BUN: 9 mg/dL (ref 6–20)
BUN: 6 mg/dL (ref 6–20)
CALCIUM: 8 mg/dL — AB (ref 8.9–10.3)
CALCIUM: 9.3 mg/dL (ref 8.9–10.3)
CHLORIDE: 106 mmol/L (ref 101–111)
CO2: 30 mmol/L (ref 22–32)
CO2: 27 mmol/L (ref 22–32)
CREATININE: 0.85 mg/dL (ref 0.61–1.24)
Chloride: 101 mmol/L (ref 101–111)
Creatinine, Ser: 0.62 mg/dL (ref 0.61–1.24)
GFR calc Af Amer: >60 mL/min (ref >60)
GFR calc non Af Amer: >60 mL/min (ref >60)
Glucose, Bld: 101 mg/dL — ABNORMAL HIGH (ref 65–99)
Glucose, Bld: 91 mg/dL (ref 65–99)
Potassium: 3.8 mmol/L (ref 3.5–5.1)
Potassium: 3.6 mmol/L (ref 3.5–5.1)
SODIUM: 138 mmol/L (ref 135–145)
SODIUM: 142 mmol/L (ref 135–145)

## 2015-09-26 LAB — I-STAT TROPONIN, ED: TROPONIN I, POC: 0 ng/mL (ref 0.00–0.08)

## 2015-09-26 LAB — FOLATE: Folate: 14.7 ng/mL (ref >5.9)

## 2015-09-26 LAB — VITAMIN B12: VITAMIN B 12: 308 pg/mL (ref 180–914)

## 2015-09-26 MED ORDER — OXYCODONE-ACETAMINOPHEN 5-325 MG PO TABS
1.0000 | ORAL_TABLET | Freq: Once | ORAL | Status: AC
  Administered 2015-09-26: 1 via ORAL
  Filled 2015-09-26: qty 1

## 2015-09-26 NOTE — ED Provider Notes (Signed)
CSN: [id number]     Arrival date & time 09/26/15  1909 History   First MD Initiated Contact with Patient 09/26/15 1958     Chief Complaint  Patient presents with  . Loss of Consciousness     (Consider location/radiation/quality/duration/timing/severity/associated sxs/prior Treatment) HPI Alexander [name] is a 60 y.o. male history of mantle cell lymphoma, COPD, comes in for evaluation of confusion and possible loss of consciousness. Patient recently admitted at [facility] on 2/5 for similar complaints, had workup done and was discharged today. Patient and family report that after discharge, patient went home and started talking to [employer] in the living room. They also report that he started to lose consciousness at that time and started falling backwards. Unclear if patient definitively lost consciousness. He reports associated diffuse abdominal discomfort that has been present since he started chemotherapy for his mantle cell lymphoma. Denies any headache, vision changes, chest pain, shortness of breath, unusual numbness or weakness, urinary symptoms, diarrhea or constipation. Upon arrival he is alert and oriented 4. Denies any discomfort.  Past Medical History  Diagnosis Date  . COPD (chronic obstructive pulmonary disease) ([place])   . Migraine   . Asthma   . BPH (benign prostatic hyperplasia)   . Deviated nasal septum   . DVT (deep venous thrombosis) ([place])   . Mantle cell lymphoma ([place])   . Erosive esophagitis    Past Surgical History  Procedure Laterality Date  . Throat surgery    . Barryx N/A   . Esophagogastroduodenoscopy (egd) with propofol N/A 02/23/2015    Procedure: ESOPHAGOGASTRODUODENOSCOPY (EGD) WITH PROPOFOL;  Surgeon: Hulen Luster, MD;  Location: [facility] ENDOSCOPY;  Service: Gastroenterology;  Laterality: N/A;  . Esophagogastroduodenoscopy (egd) with propofol N/A 05/04/2015    Procedure: ESOPHAGOGASTRODUODENOSCOPY (EGD) WITH PROPOFOL;  Surgeon: Hulen Luster, MD;  Location: [facility]  ENDOSCOPY;  Service: Gastroenterology;  Laterality: N/A;  . Tonsillectomy    . Esophagogastroduodenoscopy (egd) with propofol N/A 07/27/2015    Procedure: ESOPHAGOGASTRODUODENOSCOPY (EGD) WITH PROPOFOL;  Surgeon: Hulen Luster, MD;  Location: [facility] ENDOSCOPY;  Service: Gastroenterology;  Laterality: N/A;   Family History  Problem Relation Age of Onset  . Mesothelioma Father   . Breast cancer Mother    Social History  Substance Use Topics  . Smoking status: Former Research scientist (life sciences)  . Smokeless tobacco: None  . Alcohol Use: No    Review of Systems A 10 point review of systems was completed and was negative except for pertinent positives and negatives as mentioned in the history of present illness     Allergies  Ativan and Bee venom  Home Medications   Prior to Admission medications   Medication Sig Start Date End Date Taking? Authorizing Provider  albuterol (PROAIR HFA) 108 (90 Base) MCG/ACT inhaler Inhale 1-2 puffs into the lungs every 4 (four) hours as needed for wheezing or shortness of breath.    Yes Historical Provider, MD  docusate sodium (COLACE) 100 MG capsule Take 200 mg by mouth 2 (two) times daily as needed for mild constipation.   Yes Historical Provider, MD  fluticasone (FLONASE) 50 MCG/ACT nasal spray Place 2 sprays into both nostrils daily as needed for rhinitis.    Yes Historical Provider, MD  haloperidol (HALDOL) 0.5 MG tablet Take 0.5 mg by mouth 4 (four) times daily as needed for agitation.    Yes Historical Provider, MD  loperamide (IMODIUM) 2 MG capsule Take 2 mg by mouth as needed for diarrhea or loose stools.   Yes  Historical Provider, MD  morphine (MSIR) 30 MG tablet Take 30 mg by mouth 3 (three) times daily.   Yes Historical Provider, MD  OLANZapine (ZYPREXA) 5 MG tablet Take 2.5 mg by mouth every 6 (six) hours as needed (for GI upset).    Yes Historical Provider, MD  Omeprazole (RA OMEPRAZOLE) 20 MG TBEC Take 20 mg by mouth 2 (two) times daily.   Yes Historical Provider,  MD  senna (SENOKOT) 8.6 MG TABS tablet Take 2 tablets by mouth 2 (two) times daily as needed for mild constipation.   Yes Historical Provider, MD  SUMAtriptan (IMITREX) 50 MG tablet Take 50 mg by mouth every 2 (two) hours as needed for migraine.    Yes Historical Provider, MD  tamsulosin (FLOMAX) 0.4 MG CAPS capsule Take 0.4 mg by mouth at bedtime.   Yes Historical Provider, MD  traZODone (DESYREL) 50 MG tablet Take 50 mg by mouth at bedtime.   Yes Historical Provider, MD  urea (CARMOL) 20 % cream Apply 1 application topically as needed (for dry skin on feet).    Yes Historical Provider, MD   BP 139/86 mmHg  Pulse 77  Temp(Src) 98.5 F (36.9 C) (Oral)  Resp 16  SpO2 98% Physical Exam  Constitutional: He is oriented to person, place, and time. He appears well-developed and well-nourished.  HENT:  Head: Normocephalic and atraumatic.  Mouth/Throat: Oropharynx is clear and moist.  Eyes: Conjunctivae are normal. Pupils are equal, round, and reactive to light. Right eye exhibits no discharge. Left eye exhibits no discharge. No scleral icterus.  Neck: Neck supple.  Cardiovascular: Normal rate, regular rhythm and normal heart sounds.   Pulmonary/Chest: Effort normal and breath sounds normal. No respiratory distress. He has no wheezes. He has no rales.  Abdominal: Soft. There is no tenderness.  Musculoskeletal: He exhibits no tenderness.  Neurological: He is alert and oriented to person, place, and time.  Cranial Nerves II-XII grossly intact. Motor strength is 5/5 in all 4 extremities. Sensation is intact to light touch. Moves extremities without ataxia. Alert to person, place, time and situation.  Skin: Skin is warm and dry. No rash noted.  Psychiatric: He has a normal mood and affect. His behavior is normal. Judgment and thought content normal.  Nursing note and vitals reviewed.   ED Course  Procedures (including critical care time) Labs Review Labs Reviewed  CBG MONITORING, ED -  Abnormal; Notable for the following:    Glucose-Capillary 117 (*)    All other components within normal limits  BASIC METABOLIC PANEL  CBC  URINALYSIS, ROUTINE W REFLEX MICROSCOPIC (NOT AT [facility])  RPR  VITAMIN B12  TSH  FOLATE  I-STAT TROPOININ, ED    Imaging Review No results found. I have personally reviewed and evaluated these images and lab results as part of my medical decision-making.   EKG Interpretation   Date/Time:  Tuesday September 26 2015 19:13:44 EST Ventricular Rate:  77 PR Interval:  114 QRS Duration: 98 QT Interval:  390 QTC Calculation: 441 R Axis:   -16 Text Interpretation:  Sinus rhythm with Premature atrial complexes  Incomplete right bundle branch block Confirmed by Ashok Cordia  MD, KEVIN  ([id number]) on 09/26/2015 8:22:44 PM     Filed Vitals:   09/26/15 1920 09/26/15 2100 09/26/15 2200 09/26/15 2318  BP: 146/72 126/76 127/85 139/86  Pulse: 70 61 60 77  Temp: 98.5 F (36.9 C)     TempSrc: Oral     Resp: 16 18 10 16   SpO2:  98% 100% 100% 98%    MDM  Alexander [name] is a 60 y.o. male who presents for evaluation of intermittent confusion loss of consciousness. Patient has been admitted multiple times in the past 6 months for this problem, most recently this morning. He was discharged from [facility] this morning after having multiple CT scans of head, chest and abdomen with no acute findings. No other lab abnormalities. Patient was recommended to follow up with a motion specialist at the [facility] by the neurologist at [facility]. On arrival today, patient is at baseline, alert and oriented to person, place, time and situation. Physical exam is unremarkable. Labs are also grossly unremarkable. I do not feel the need to repeat imaging at this time. Plan to discharge with outpatient neurology follow-up. Added dementia panel to blood work today for outpatient follow-up. No evidence of other acute or emergent pathology at this time. Prior to discharge, I discussed and  reviewed this case with my attending, Dr. Ashok Cordia who also saw and evaluated the patient and agrees with above plan Final diagnoses:  Confusion       Comer Locket, PA-C 09/27/15 [place], MD 09/27/15 [phone]

## 2015-09-26 NOTE — Care Management (Signed)
Patient for discharge home.  Found that he was currently open to [facility].  Agency is aware of admission and discharge.

## 2015-09-26 NOTE — Telephone Encounter (Signed)
  Oncology Nurse Navigator Documentation  Navigator Location: [facility] (09/26/15 1500) Navigator Encounter Type: Telephone (09/26/15 1500)               Barriers/Navigation Needs: Coordination of Care (09/26/15 1500)   Interventions: Coordination of Care (09/26/15 1500)   Coordination of Care: EUS (09/26/15 1500)                  Time Spent with Patient: 15 (09/26/15 1500)   Received referral for EUS from [facility] for Alexander [name]. Needs to be scheduled after 2/16 which is whne he is scheduled and Endoscopy. EUS can be performed 10/19/15 at [facility] with Dr Mont Dutton. Voicemail left for patient to return call.

## 2015-09-26 NOTE — Discharge Summary (Signed)
[facility] [place] NAME: Alexander [name]    MR#:  [id number]  DATE OF BIRTH:  1956-01-12  DATE OF ADMISSION:  09/24/2015 ADMITTING PHYSICIAN: Saundra Shelling, MD  DATE OF DISCHARGE: 09/26/2015 11:00 AM  PRIMARY CARE PHYSICIAN: Perrin Maltese, MD    ADMISSION DIAGNOSIS:  Abdominal pain, unspecified abdominal location [R10.9] Altered mental status, unspecified altered mental status type [R41.82] Diarrhea, unspecified type [R19.7]  DISCHARGE DIAGNOSIS:  Principal Problem:   Gastroenteritis, acute Active Problems:   Hypokalemia   Gastroenteritis   SECONDARY DIAGNOSIS:   Past Medical History  Diagnosis Date  . COPD (chronic obstructive pulmonary disease) ([place])   . Migraine   . Asthma   . BPH (benign prostatic hyperplasia)   . Deviated nasal septum   . DVT (deep venous thrombosis) ([place])   . 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 Erosive esophagitis     HOSPITAL COURSE:   60 year old male with history of COPD, mental cell lymphoma with last chemotherapy over a year ago who initially presented for confusion and has reported 4 days of nausea, vomiting and diarrhea.  1. Intractable nausea and vomiting: Patient symptoms have subsided. I suspect this is viral in etiology.  He tolerated his diet and is doing well.  2. Diarrhea: This is viral in etiology.  3. Hypokalemia: This is from nausea vomiting diarrhea. Potassium wasrepleted.  4. Near syncope: This is vasovagal in etiology due to problem #1 and 2.  5. BPH: Continue Flomax.   DISCHARGE CONDITIONS AND DIET:   Patient still for discharge on a heart healthy diet  CONSULTS OBTAINED:     DRUG ALLERGIES:   Allergies  Allergen Reactions  . Ativan [Lorazepam] Other (See Comments)    Reaction:  Dizziness   . Bee Venom     DISCHARGE MEDICATIONS:   Discharge Medication List as of 09/26/2015 10:02 AM    CONTINUE these medications which have NOT CHANGED   Details  albuterol  (PROAIR HFA) 108 (90 Base) MCG/ACT inhaler Inhale 1-2 puffs into the lungs every 4 (four) hours as needed., Until Discontinued, Historical Med    docusate sodium (COLACE) 100 MG capsule Take 200 mg by mouth 2 (two) times daily as needed for mild constipation., Until Discontinued, Historical Med    fluticasone (FLONASE) 50 MCG/ACT nasal spray Place 2 sprays into both nostrils daily as needed for rhinitis. , Until Discontinued, Historical Med    haloperidol (HALDOL) 0.5 MG tablet Take 0.5 mg by mouth 4 (four) times daily as needed., Until Discontinued, Historical Med    loperamide (IMODIUM) 2 MG capsule Take 2 mg by mouth as needed for diarrhea or loose stools., Until Discontinued, Historical Med    morphine (MSIR) 30 MG tablet Take 30 mg by mouth 3 (three) times daily., Until Discontinued, Historical Med    OLANZapine (ZYPREXA) 5 MG tablet Take 2.5 mg by mouth every 6 (six) hours as needed (for GI upset). , Until Discontinued, Historical Med    Omeprazole (RA OMEPRAZOLE) 20 MG TBEC Take 20 mg by mouth 2 (two) times daily., Until Discontinued, Historical Med    senna (SENOKOT) 8.6 MG TABS tablet Take 2 tablets by mouth 2 (two) times daily as needed for mild constipation., Until Discontinued, Historical Med    SUMAtriptan (IMITREX) 50 MG tablet Take 50 mg by mouth every 2 (two) hours as needed for migraine. , Until Discontinued, Historical Med    tamsulosin (FLOMAX) 0.4 MG CAPS capsule Take 0.4 mg by  mouth at bedtime., Until Discontinued, Historical Med    traZODone (DESYREL) 50 MG tablet Take 50 mg by mouth at bedtime., Until Discontinued, Historical Med    urea (CARMOL) 20 % cream Apply 1 application topically as needed (for dry skin on feet). , Until Discontinued, Historical Med              Today   CHIEF COMPLAINT:  She is doing well this point. Patient is ready for discharge. Patient has no nausea, vomiting or diarrhea.   VITAL SIGNS:  Blood pressure 140/77, pulse 60,  temperature 98.1 F (36.7 C), temperature source Oral, resp. rate 16, height 6\' 2"  (1.88 m), weight 75.569 kg (166 lb 9.6 oz), SpO2 97 %.   REVIEW OF SYSTEMS:  Review of Systems  Constitutional: Negative for fever, chills and malaise/fatigue.  HENT: Negative for sore throat.   Eyes: Negative for blurred vision.  Respiratory: Negative for cough, hemoptysis, shortness of breath and wheezing.   Cardiovascular: Negative for chest pain, palpitations and leg swelling.  Gastrointestinal: Negative for nausea, vomiting, abdominal pain, diarrhea and blood in stool.  Genitourinary: Negative for dysuria.  Musculoskeletal: Negative for back pain.  Neurological: Negative for dizziness, tremors and headaches.  Endo/Heme/Allergies: Does not bruise/bleed easily.     PHYSICAL EXAMINATION:  GENERAL:  60 y.o.-year-old patient lying in the bed with no acute distress.  NECK:  Supple, no jugular venous distention. No thyroid enlargement, no tenderness.  LUNGS: Normal breath sounds bilaterally, no wheezing, rales,rhonchi  No use of accessory muscles of respiration.  CARDIOVASCULAR: S1, S2 normal. No murmurs, rubs, or gallops.  ABDOMEN: Soft, non-tender, non-distended. Bowel sounds present. No organomegaly or mass.  EXTREMITIES: No pedal edema, cyanosis, or clubbing.  PSYCHIATRIC: The patient is alert and oriented x 3.  SKIN: No obvious rash, lesion, or ulcer.   DATA REVIEW:   CBC  Recent Labs Lab 09/25/15 0407  WBC 7.9  HGB 13.2  HCT 38.0*  PLT 176    Chemistries   Recent Labs Lab 09/24/15 2118 09/25/15 0407  09/26/15 0608  NA 140 139  --  138  K 3.1* 2.8*  < > 3.8  CL 103 103  --  106  CO2 29 28  --  30  GLUCOSE 74 102*  --  101*  BUN 19 14  --  9  CREATININE 0.90 0.85  --  0.62  CALCIUM 9.2 8.2*  --  8.0*  MG  --  2.0  --   --   AST 16  --   --   --   ALT 10*  --   --   --   ALKPHOS 101  --   --   --   BILITOT 0.8  --   --   --   < > = values in this interval not  displayed.  Cardiac Enzymes  Recent Labs Lab 09/20/15 1818 09/22/15 1321 09/24/15 2118  TROPONINI <0.03 <0.03 <0.03    Microbiology Results  @MICRORSLT48 @  RADIOLOGY:  Ct Head Wo Contrast  09/24/2015  CLINICAL DATA:  Weakness and confusion, recurrent falls, visual hallucinations. History of tremor, lymphoma, chronic abdominal pain. EXAM: CT HEAD WITHOUT CONTRAST TECHNIQUE: Contiguous axial images were obtained from the base of the skull through the vertex without intravenous contrast. COMPARISON:  CT head September 22, 2015 FINDINGS: Mild ventriculomegaly on the basis of global parenchymal brain volume loss as there is overall commensurate enlargement of cerebral sulci and cerebellar folia. Mild patchy supratentorial white matter hypodensities  without midline shift, mass effect, intraparenchymal hemorrhage or acute large vascular territory infarcts. No abnormal extra-axial fluid collections. Basal cisterns are patent. Mild calcific atherosclerosis of the carotid siphons. Dehiscent cartilaginous nasal septum can be seen with chronic drug use. Mild paranasal sinus mucosal thickening without air-fluid levels. The mastoid air cells are well aerated. No skull fracture. IMPRESSION: No acute intracranial process. Mild global parenchymal brain volume loss for age and mild chronic small vessel ischemic disease. Electronically Signed   By: Elon Alas M.D.   On: 09/24/2015 22:57   Ct Abdomen Pelvis W Contrast  09/24/2015  CLINICAL DATA:  60 year old male with left lower quadrant abdominal pain and vomiting. EXAM: CT ABDOMEN AND PELVIS WITH CONTRAST TECHNIQUE: Multidetector CT imaging of the abdomen and pelvis was performed using the standard protocol following bolus administration of intravenous contrast. CONTRAST:  156mL OMNIPAQUE IOHEXOL 300 MG/ML  SOLN COMPARISON:  CT dated 08/17/2015 FINDINGS: The visualized lung bases are clear. No intra-abdominal free air or free fluid. The liver, gallbladder,  pancreas, spleen, and the right adrenal gland appear unremarkable. A 1.2 cm left adrenal probable adenoma. The kidneys, visualized ureters appear unremarkable. Stable appearing right renal inferior pole hypodense lesion is incompletely characterized but likely represents a cyst. There is apparent diffuse thickening of the bladder wall which may be partly related to underdistention. Cystitis is not excluded. Correlation with urinalysis recommended. The prostate gland and seminal vesicles are grossly unremarkable. There is scattered sigmoid and colonic diverticula without active inflammatory changes. Segmental area of apparent thickening in the sigmoid colon in the left hemipelvis likely related to chronic inflammation and muscular hypertrophy as well as under distention of the bowel. Loose stool noted throughout the colon compatible with diarrheal state. No evidence of bowel obstruction. Normal appendix. Mild aortoiliac atherosclerotic disease. No portal venous gas. No adenopathy. The abdominal wall soft tissues appear unremarkable. The osseous structures are intact. IMPRESSION: Diarrheal state. Correlation with clinical exam and stool cultures recommended. Diverticulosis.  No evidence of bowel obstruction or inflammation. Electronically Signed   By: Anner Crete M.D.   On: 09/24/2015 23:01   Dg Chest Portable 1 View  09/24/2015  CLINICAL DATA:  Acute onset of generalized weakness and confusion. Multiple recent falls. Syncope. Initial encounter. EXAM: PORTABLE CHEST 1 VIEW COMPARISON:  Chest radiograph and CTA of the chest performed 09/20/2015 FINDINGS: The lungs are well-aerated and clear. There is no evidence of focal opacification, pleural effusion or pneumothorax. A calcified granuloma is noted at the right upper lung zone. The cardiomediastinal silhouette is within normal limits. No acute osseous abnormalities are seen. IMPRESSION: No acute cardiopulmonary process seen. No displaced rib fractures  identified. Electronically Signed   By: Garald Balding M.D.   On: 09/24/2015 22:05      Management plans discussed with the patient and he is in agreement. Stable for discharge home  Patient should follow up with PCP 1 week  CODE STATUS:     Code Status Orders        Start     Ordered   09/25/15 0133  Full code   Continuous     09/25/15 0132    Code Status History    Date Active Date Inactive Code Status Order ID Comments User Context   08/17/2015 10:03 PM 08/19/2015  2:23 PM Full Code [id number]  Gladstone Lighter, MD Inpatient   08/10/2015  8:28 AM 08/12/2015  1:40 PM Full Code [id number]  Saundra Shelling, MD Inpatient   07/17/2015  1:08 AM 07/19/2015  2:13 PM Full Code [id number]  Saundra Shelling, MD Inpatient   05/18/2015  1:29 PM 05/19/2015  3:02 PM Full Code [id number]  Dustin Flock, MD Inpatient    Advance Directive Documentation        Most Recent Value   Type of Advance Directive  Healthcare Power of Attorney, Living will   Pre-existing out of facility DNR order (yellow form or pink MOST form)     "MOST" Form in Place?        TOTAL TIME TAKING CARE OF THIS PATIENT: 35 minutes.    Note: This dictation was prepared with Dragon dictation along with smaller phrase technology. Any transcriptional errors that result from this process are unintentional.  Ndrew Creason M.D on 09/26/2015 at 11:43 AM  Between 7am to 6pm - Pager - [phone] After 6pm go to [email or web address] - password [email or web address] [facility]  Office  [phone]  CC: Primary care physician; Perrin Maltese, MD

## 2015-09-26 NOTE — ED Notes (Signed)
Pt brought in by daughter because she states the patient has been acting strangely, such as speaking in german and that he was saying he saw a dog that wasn't there. She reports he has passed out, "he was on the phone with my mom and then just went out." He was just d/c from [facility] for same. Daughter reports she thinks her dad took too many pills today. Pt is not sure what medication he took but states he doesn't know how many he took and just knows that it was for his abdominal pain. Pt is currently A&OX4.

## 2015-09-26 NOTE — Progress Notes (Signed)
Patient d/c'd home. Education provided, no questions at this time. Patient to be picked up by wife. Telemetry removed. Daryel Kenneth R Mansfield  

## 2015-09-26 NOTE — Discharge Instructions (Signed)
There does not appear to be an emergent cause for your symptoms at this time. Your exam and labs were all very reassuring. Please follow-up with [facility] neurology for further evaluation and management of your symptoms. Return to ED for any new or worsening symptoms as we discussed.  Confusion Confusion is the inability to think with your usual speed or clarity. Confusion may come on quickly or slowly over time. How quickly the confusion comes on depends on the cause. Confusion can be due to any number of causes. CAUSES   Concussion, head injury, or head trauma.  Seizures.  Stroke.  Fever.  Brain tumor.  Age related decreased brain function (dementia).  Heightened emotional states like rage or terror.  Mental illness in which the person loses the ability to determine what is real and what is not (hallucinations).  Infections such as a urinary tract infection (UTI).  Toxic effects from alcohol, drugs, or prescription medicines.  Dehydration and an imbalance of salts in the body (electrolytes).  Lack of sleep.  Low blood sugar (diabetes).  Low levels of oxygen from conditions such as chronic lung disorders.  Drug interactions or other medicine side effects.  Nutritional deficiencies, especially niacin, thiamine, vitamin C, or vitamin B.  Sudden drop in body temperature (hypothermia).  Change in routine, such as when traveling or hospitalized. SIGNS AND SYMPTOMS  People often describe their thinking as cloudy or unclear when they are confused. Confusion can also include feeling disoriented. That means you are unaware of where or who you are. You may also not know what the date or time is. If confused, you may also have difficulty paying attention, remembering, and making decisions. Some people also act aggressively when they are confused.  DIAGNOSIS  The medical evaluation of confusion may include:  Blood and urine tests.  X-rays.  Brain and nervous system  tests.  Analyzing your brain waves (electroencephalogram or EEG).  Magnetic resonance imaging (MRI) of your head.  Computed tomography (CT) scan of your head.  Mental status tests in which your health care provider may ask many questions. Some of these questions may seem silly or strange, but they are a very important test to help diagnose and treat confusion. TREATMENT  An admission to the hospital may not be needed, but a person with confusion should not be left alone. Stay with a family member or friend until the confusion clears. Avoid alcohol, pain relievers, or sedative drugs until you have fully recovered. Do not drive until directed by your health care provider. HOME CARE INSTRUCTIONS  What family and friends can do:  To find out if someone is confused, ask the person to state his or her name, age, and the date. If the person is unsure or answers incorrectly, he or she is confused.  Always introduce yourself, no matter how well the person knows you.  Often remind the person of his or her location.  Place a calendar and clock near the confused person.  Help the person with his or her medicines. You may want to use a pill box, an alarm as a reminder, or give the person each dose as prescribed.  Talk about current events and plans for the day.  Try to keep the environment calm, quiet, and peaceful.  Make sure the person keeps follow-up visits with his or her health care provider. PREVENTION  Ways to prevent confusion:  Avoid alcohol.  Eat a balanced diet.  Get enough sleep.  Take medicine only as directed by  your health care provider.  Do not become isolated. Spend time with other people and make plans for your days.  Keep careful watch on your blood sugar levels if you are diabetic. SEEK IMMEDIATE MEDICAL CARE IF:   You develop severe headaches, repeated vomiting, seizures, blackouts, or slurred speech.  There is increasing confusion, weakness, numbness,  restlessness, or personality changes.  You develop a loss of balance, have marked dizziness, feel uncoordinated, or fall.  You have delusions, hallucinations, or develop severe anxiety.  Your family members think you need to be rechecked.   This information is not intended to replace advice given to you by your health care provider. Make sure you discuss any questions you have with your health care provider.   Document Released: 09/12/2004 Document Revised: 08/26/2014 Document Reviewed: 09/10/2013 Elsevier Interactive Patient Education [employer].

## 2015-09-26 NOTE — Progress Notes (Signed)
Patient discharged home prior to RN visit. Writer spoke with [facility]ann who advised that no new services are requested for Mr. Alexander [name]. [facility] team and referral updated. Writer to send physician progress note when posted. Thank you. Flo Shanks, RN, BSN, [facility] Liaison [phone] c

## 2015-09-27 LAB — RPR: RPR: NONREACTIVE

## 2015-09-27 NOTE — Progress Notes (Signed)
  Oncology Nurse Navigator Documentation                                                   Spoke with spouse on the phone. EUS scheduled for 10/19/15 with Dr Mont Dutton at [facility]. Went over instructions and copy also mailed to home address. Provided my contact information for any further questions or concerns.  INSTRUCTIONS FOR ENDOSCOPIC ULTRASOUND -Your procedure has been scheduled for March 2nd  with Dr Mont Dutton at [facility] will contact you to pre-register over the phone. If for any reason you have not received a call within one week prior to your scheduled procedure date, please call [phone]. -To get your scheduled arrival time, please call the Endoscopy unit at  [phone] between 1-3pm on: March 1st  -[place]:  1. If you are scheduled for a morning procedure, nothing to drink after midnight  -If you are scheduled for an afternoon procedure, you may have clear liquids until 5 hours prior  to the procedure but no carbonated drinks or broth  2. NO FOOD THE DAY OF YOUR PROCEDURE  3. You may take your heart, seizure, blood pressure, Parkinson's or breathing medications at  6am with just enough water to get your pills down  4. Do not take any oral Diabetic medications the morning of your procedure.  5. If you are a diabetic and are using insulin, please notify your prescribing physician of this  procedure as your dose may need to be altered related to not being able to eat or drink.   5. Do not take Vitamins  6. Your anticoagulant Xarelto will need to be held starting 48 hours prior to the procedure.  Permission to hold this medication will need to be obtained from the prescribing physician. (I  have contacted Dr Lamonte Sakai and will further instruct you regarding this after her response  has been received.   -On the day of your procedure, come to the [facility] Admitting/Registration desk (First desk on the right) at the scheduled  arrival time. You MUST have someone drive you home from your procedure. You must have a responsible adult with a valid drivers license who is on site throughout your entire procedure and who can stay with you for several hours after your procedure. You may not go home alone in a taxi, shuttle [place] or bus, as the drivers will not be responsible for you.  --If you have any questions please call me at the above contact

## 2015-09-28 ENCOUNTER — Other Ambulatory Visit: Payer: Self-pay

## 2015-09-28 ENCOUNTER — Encounter: Payer: Self-pay | Admitting: Neurology

## 2015-09-28 ENCOUNTER — Ambulatory Visit (INDEPENDENT_AMBULATORY_CARE_PROVIDER_SITE_OTHER): Payer: Medicaid Other | Admitting: Neurology

## 2015-09-28 DIAGNOSIS — I824Z9 Acute embolism and thrombosis of unspecified deep veins of unspecified distal lower extremity: Secondary | ICD-10-CM | POA: Diagnosis not present

## 2015-09-28 DIAGNOSIS — I951 Orthostatic hypotension: Secondary | ICD-10-CM | POA: Diagnosis not present

## 2015-09-28 DIAGNOSIS — R55 Syncope and collapse: Secondary | ICD-10-CM | POA: Diagnosis not present

## 2015-09-28 NOTE — Patient Instructions (Signed)
-   continue Xarelto for DVT treatment and follow up with Dr. Humphrey Rolls - check BP at home, especially when felt dizzy and confused - lie down when feel dizzy or discomfort, check BP and glucose - keep hydrated and avoid low BP - take midodrine as it is now, avoid taking after 4PM - no need for sinemet which can cause orthostatic low BP - limited use of morphine and oxycodone and be careful with BP when on these meds - follow up with cardiology for synconpe - will do EEG to rule out seizure but really low suspicious - follow up with PCP closely - follow up with GI doctor for abdominal pain and nausea vomiting diarrhea treatment - follow up oncologist for lymphoma. - continue PT/OT for gait imbalance - please buy TED hose up to thigh to help for orthostatic low BP - follow up in 2 months.

## 2015-09-28 NOTE — Progress Notes (Addendum)
NEUROLOGY CLINIC NEW PATIENT NOTE  NAME: Alexander [name] DOB: 11-28-1955 REFERRING PHYSICIAN: Perrin Maltese, MD  I saw Alexander [name] as a new consult in the neurovascular clinic today regarding  Chief Complaint  Patient presents with  . Follow-up    ER referral from [facility]  .  HPI: Alexander [name] is a 60 y.o. male with PMH of Mantel cell lymphoma s/p chemo in remission, orthostatic hypotension on midodrine, syncope, chronic dizziness, COPD, Barrett's esophagus with abd pain, N/V/D following with GI in [place], DVT on Xarelto who presents as a new patient for AMS, confusion and pass out.   08/09/2015 ED visit - generalized weakness with abdominal pain and passing out  08/10/2015 admission - fell after standing up and dizziness. Wife stated some convulsion seen. Considered convulsive syncope. Put him on midodrine 2.5 mg twice a day. Also recommended TED hose. Initial concerning for parkinsonism and put on Sinemet, which however discontinued the second day due to lack of evidence of parkinsonism.  08/17/2015 admission - due to pneumonia and COPD. Also felt dizziness. On Xarelto for recent DVT.  09/20/2015 ED visit - complaining this chest pain, typical abdominal pain, with syncope and blackout. Likely to be vasovagal syncope. On Midodrin for orthostatic hypotension.  09/22/2015 ED visit - generalized weakness, abdominal pain, fell in kitchen, likely passed out. Patient has not been eating for 2 days due to abdominal pain.  09/24/15 admission - nausea, vomiting, diarrhea, confusion, felt weakness, tiredness, about to pass out, consider vasovagal syncope.  09/26/15 ED visit - confusion, talking to people who was not there, associate with diffuse abdominal discomfort and falling backwards, about to passout. symptoms resolved by the time of ED arrival.  On today's visit, patient neurological stable, orthostatic vitals stable. Still has discomfort at abdomen, noxious, no vomiting. Follow up with  GI for multiple barriers esophagus ablations. Also scheduled next procedure in 2 weeks with GI in [place]. Wife complains recent weight loss, and decreased intake due to abdominal pain. Patient doesn't like ensure shakes, only prefer ginger ale. Currently on nasal brain, has not had any TED hose yet. Chronic generalized weakness, denies any hemiparesis, numbness, vision changes or speech difficulty. MRI brain in December 2016 showed no acute abnormality. Multiple CT head no acute intracranial process. Has not had EEG yet.     Past Medical History  Diagnosis Date  . COPD (chronic obstructive pulmonary disease) ([place])   . Migraine   . Asthma   . BPH (benign prostatic hyperplasia)   . Deviated nasal septum   . DVT (deep venous thrombosis) ([place])   . Mantle cell lymphoma ([place])   . Erosive esophagitis    Past Surgical History  Procedure Laterality Date  . Throat surgery    . Barryx N/A   . Esophagogastroduodenoscopy (egd) with propofol N/A 02/23/2015    Procedure: ESOPHAGOGASTRODUODENOSCOPY (EGD) WITH PROPOFOL;  Surgeon: Alexander Luster, MD;  Location: [facility] ENDOSCOPY;  Service: Gastroenterology;  Laterality: N/A;  . Esophagogastroduodenoscopy (egd) with propofol N/A 05/04/2015    Procedure: ESOPHAGOGASTRODUODENOSCOPY (EGD) WITH PROPOFOL;  Surgeon: Alexander Luster, MD;  Location: [facility] ENDOSCOPY;  Service: Gastroenterology;  Laterality: N/A;  . Tonsillectomy    . Esophagogastroduodenoscopy (egd) with propofol N/A 07/27/2015    Procedure: ESOPHAGOGASTRODUODENOSCOPY (EGD) WITH PROPOFOL;  Surgeon: Alexander Luster, MD;  Location: [facility] ENDOSCOPY;  Service: Gastroenterology;  Laterality: N/A;   Family History  Problem Relation Age of Onset  . Mesothelioma Father   . Breast cancer Mother  Current Outpatient Prescriptions  Medication Sig Dispense Refill  . albuterol (PROAIR HFA) 108 (90 Base) MCG/ACT inhaler Inhale 1-2 puffs into the lungs every 4 (four) hours as needed for wheezing or shortness of breath.     . docusate  sodium (COLACE) 100 MG capsule Take 200 mg by mouth 2 (two) times daily as needed for mild constipation.    . fluticasone (FLONASE) 50 MCG/ACT nasal spray Place 2 sprays into both nostrils daily as needed for rhinitis.     Marland Kitchen loperamide (IMODIUM) 2 MG capsule Take 2 mg by mouth as needed for diarrhea or loose stools.    Marland Kitchen morphine (MSIR) 30 MG tablet Take 30 mg by mouth 3 (three) times daily.    . Omeprazole (RA OMEPRAZOLE) 20 MG TBEC Take 20 mg by mouth 2 (two) times daily.    . rivaroxaban (XARELTO) 20 MG TABS tablet Take 20 mg by mouth daily with supper.    . senna (SENOKOT) 8.6 MG TABS tablet Take 2 tablets by mouth 2 (two) times daily as needed for mild constipation.    . SUMAtriptan (IMITREX) 50 MG tablet Take 50 mg by mouth every 2 (two) hours as needed for migraine.     . tamsulosin (FLOMAX) 0.4 MG CAPS capsule Take 0.4 mg by mouth at bedtime.    . traZODone (DESYREL) 50 MG tablet Take 50 mg by mouth at bedtime.    . urea (CARMOL) 20 % cream Apply 1 application topically as needed (for dry skin on feet).      No current facility-administered medications for this visit.   Allergies  Allergen Reactions  . Ativan [Lorazepam] Other (See Comments)    Reaction:  Dizziness   . Bee Venom    Social History   Social History  . Marital Status: Married    Spouse Name: N/A  . Number of Children: N/A  . Years of Education: N/A   Occupational History  . retired    Social History Main Topics  . Smoking status: Former Research scientist (life sciences)  . Smokeless tobacco: Not on file  . Alcohol Use: No  . Drug Use: No  . Sexual Activity: Yes   Other Topics Concern  . Not on file   Social History Narrative   Lives with wife and son at home.    Review of Systems Full 14 system review of systems performed and notable only for those listed, all others are neg:  Constitutional:  Weight loss, fatigue Cardiovascular:  Ear/Nose/Throat:  Hearing loss, spinning sensation, trouble swallowing Skin: Itching Eyes:   Double vision Respiratory:  SOB Gastroitestinal:  Diarrhea, constipation Genitourinary: Urination problems Hematology/Lymphatic:   Endocrine: Feeling hot, feeling cold Musculoskeletal:   Allergy/Immunology:   Neurological:  Memory loss, confusion, headache, numbness, weakness, difficulty for swallowing, dizziness, seizure, passing out, tremor Psychiatric: Anxiety, too much sleep, not enough sleep, decreased energy, change in appetite, hallucinations Sleep: Insomnia, sleepiness, restless legs   Physical Exam  Filed Vitals:   09/28/15 0938  BP: 131/88  Pulse: 80   Lying 139/96 and 73, sitting 151/101 and 80, standing 136/97 and 91  General - mildly cachectic, well developed, mild restless and lethargy.  Ophthalmologic - fundi not visualized due to discomfort.  Cardiovascular - Regular rate and rhythm. Carotid pulses were 2+ without bruits .   Neck - supple, no nuchal rigidity.  Mental Status -  Level of arousal and orientation to time, place, and person were intact. Language including expression, naming, repetition, comprehension, reading, and writing was assessed  and found intact. Fund of Knowledge was assessed and was intact.  Cranial Nerves II - XII - II - Visual field intact OU. III, IV, VI - Extraocular movements intact. V - Facial sensation intact bilaterally. VII - Facial movement intact bilaterally. VIII - Hearing & vestibular intact bilaterally. X - Palate elevates symmetrically. XI - Chin turning & shoulder shrug intact bilaterally. XII - Tongue protrusion intact.  Motor Strength - The patient's strength was 5-/5 in all extremities and pronator drift was absent.  Bulk was normal and fasciculations were absent.   Motor Tone - Muscle tone was assessed at the neck and appendages and was normal, no rigidity.  Reflexes - The patient's reflexes were 1+ in all extremities and he had no pathological reflexes.  Sensory - Light touch, temperature/pinprick, vibration  and proprioception were assessed and were normal. Romberg testing teetering no fall.  Coordination - The patient had normal movements in the hands with no ataxia or dysmetria.  Physiological tremor was present.  Gait and Station - able to walk without walker, however unsteady gait.    Imaging  I have personally reviewed the radiological images below and agree with the radiology interpretations.  CT head 09/24/2015 No acute intracranial process. Mild global parenchymal brain volume loss for age and mild chronic small vessel ischemic disease.  MRI 08/10/2015 No acute or subacute abnormality. Scattered foci of T2 and FLAIR signal within the cerebral hemispheric white matter that are nonspecific. The differential diagnosis includes small-vessel disease, migraine related foci, previous head trauma or vasculitis, or demyelinating disease.  Carotid Doppler 08/10/2015 - Minor carotid atherosclerosis. No hemodynamically significant ICA stenosis. Degree of narrowing less than 50% bilaterally. Patent antegrade vertebral flow bilaterally.  CTA head 08/11/15 - normal CTA of the head. No significant intracranial stenosis. Negative for aneurysm  TTE 08/10/2015 - - Normal study. technically difficult study Normal overall left ventricular function Normal wall motion Ejection fraction of 60%  EEG 08/10/2015 - No evidence of subclinical electrographic seizure activity from this short recording. Absence of epileptiform discharge doesn't rule out seizure disorder, which is a clinical diagnosis.  Lab Review Component     Latest Ref Rng 09/26/2015  RPR     Non Reactive Non Reactive  Vitamin B-12     180 - 914 pg/mL 308  TSH     0.350 - 4.500 uIU/mL 1.344  Folate     >5.9 ng/mL 14.7     Assessment and Plan:   In summary, Alexander [name] is a 60 y.o. male with PMH of Mantel cell lymphoma s/p chemo in remission, orthostatic hypotension on midodrine, syncope, chronic dizziness, COPD,  Barrett's esophagus with abd pain, N/V/D following with GI in [place], DVT on Xarelto who presents as a new patient for AMS, confusion and pass out.  Patient confusion, dizziness, passing out episodes most likely associated with orthostatic hypotension and vasovagal syncope, and likely seizure. The underlying cause likely to be multifactorial, including malignancy, poor intake, dehydration, abdominal discomfort, and/or infection. So far work out including MRI, CTA, EEG, all negative. TSH normal, but B-12 and low side. Currently on midodrine. Also on Xarelto for DVT treatment.  - continue Xarelto for DVT treatment and follow up with Dr. Humphrey Rolls - check BP at home, especially when felt dizzy and confused - lie down when feel dizzy or discomfort, check BP and glucose - keep hydrated and avoid low BP - take midodrine as instructed but avoid taking after 4PM - limited use of morphine and oxycodone and  be careful with BP when on these meds - follow up with cardiology for synconpe - will repeat EEG to rule out seizure but really low suspicious - follow up with PCP closely - follow up with GI doctor for abdominal pain and nausea vomiting diarrhea - follow up oncologist for lymphoma. - continue PT/OT for gait imbalance - recommend TED hose up to thigh to help for orthostatic hypotension - follow up in 2 months.  Thank you very much for the opportunity to participate in the care of this patient.  Please do not hesitate to call if any questions or concerns arise.  Orders Placed This Encounter  Procedures  . EEG adult    Standing Status: Future     Number of Occurrences:      Standing Expiration Date: 09/27/2016    Order Specific Question:  Where should this test be performed?    Answer:  [facility]    Meds ordered this encounter  Medications  . rivaroxaban (XARELTO) 20 MG TABS tablet    Sig: Take 20 mg by mouth daily with supper.    Patient Instructions  - continue Xarelto for DVT treatment and follow up  with Dr. Humphrey Rolls - check BP at home, especially when felt dizzy and confused - lie down when feel dizzy or discomfort, check BP and glucose - keep hydrated and avoid low BP - take midodrine as it is now, avoid taking after 4PM - no need for sinemet which can cause orthostatic low BP - limited use of morphine and oxycodone and be careful with BP when on these meds - follow up with cardiology for synconpe - will do EEG to rule out seizure but really low suspicious - follow up with PCP closely - follow up with GI doctor for abdominal pain and nausea vomiting diarrhea treatment - follow up oncologist for lymphoma. - continue PT/OT for gait imbalance - please buy TED hose up to thigh to help for orthostatic low BP - follow up in 2 months.    Rosalin Hawking, MD PhD [facility] [address] [place], [place] [address] [phone]

## 2015-09-28 NOTE — Addendum Note (Signed)
Addended by: Rosalin Hawking on: 09/28/2015 11:16 PM   Modules accepted: Orders

## 2015-09-30 ENCOUNTER — Emergency Department (HOSPITAL_COMMUNITY): Payer: Medicaid Other

## 2015-09-30 ENCOUNTER — Encounter (HOSPITAL_COMMUNITY): Payer: Self-pay | Admitting: Nurse Practitioner

## 2015-09-30 ENCOUNTER — Emergency Department (HOSPITAL_COMMUNITY)
Admission: EM | Admit: 2015-09-30 | Discharge: 2015-09-30 | Disposition: A | Discharge status: Home / Self Care | Payer: Medicaid Other | Attending: Emergency Medicine | Admitting: Emergency Medicine

## 2015-09-30 DIAGNOSIS — R197 Diarrhea, unspecified: Secondary | ICD-10-CM | POA: Insufficient documentation

## 2015-09-30 DIAGNOSIS — E876 Hypokalemia: Secondary | ICD-10-CM

## 2015-09-30 DIAGNOSIS — G43909 Migraine, unspecified, not intractable, without status migrainosus: Secondary | ICD-10-CM | POA: Insufficient documentation

## 2015-09-30 DIAGNOSIS — J449 Chronic obstructive pulmonary disease, unspecified: Secondary | ICD-10-CM | POA: Insufficient documentation

## 2015-09-30 DIAGNOSIS — R109 Unspecified abdominal pain: Secondary | ICD-10-CM | POA: Insufficient documentation

## 2015-09-30 DIAGNOSIS — Z79891 Long term (current) use of opiate analgesic: Secondary | ICD-10-CM | POA: Insufficient documentation

## 2015-09-30 DIAGNOSIS — G8929 Other chronic pain: Secondary | ICD-10-CM | POA: Diagnosis not present

## 2015-09-30 DIAGNOSIS — N400 Enlarged prostate without lower urinary tract symptoms: Secondary | ICD-10-CM | POA: Diagnosis not present

## 2015-09-30 DIAGNOSIS — Z79899 Other long term (current) drug therapy: Secondary | ICD-10-CM | POA: Diagnosis not present

## 2015-09-30 DIAGNOSIS — R4182 Altered mental status, unspecified: Secondary | ICD-10-CM | POA: Insufficient documentation

## 2015-09-30 DIAGNOSIS — Z8589 Personal history of malignant neoplasm of other organs and systems: Secondary | ICD-10-CM | POA: Diagnosis not present

## 2015-09-30 DIAGNOSIS — Z86718 Personal history of other venous thrombosis and embolism: Secondary | ICD-10-CM | POA: Insufficient documentation

## 2015-09-30 DIAGNOSIS — Z7901 Long term (current) use of anticoagulants: Secondary | ICD-10-CM | POA: Insufficient documentation

## 2015-09-30 DIAGNOSIS — Z87891 Personal history of nicotine dependence: Secondary | ICD-10-CM | POA: Diagnosis not present

## 2015-09-30 DIAGNOSIS — R112 Nausea with vomiting, unspecified: Secondary | ICD-10-CM | POA: Insufficient documentation

## 2015-09-30 DIAGNOSIS — R531 Weakness: Secondary | ICD-10-CM | POA: Diagnosis present

## 2015-09-30 LAB — URINALYSIS, ROUTINE W REFLEX MICROSCOPIC
Bilirubin Urine: NEGATIVE
Glucose, UA: NEGATIVE mg/dL
Hgb urine dipstick: NEGATIVE
Ketones, ur: NEGATIVE mg/dL
Nitrite: NEGATIVE
Protein, ur: NEGATIVE mg/dL
Specific Gravity, Urine: 1.027 (ref 1.005–1.030)
pH: 5.5 (ref 5.0–8.0)

## 2015-09-30 LAB — COMPREHENSIVE METABOLIC PANEL
ALBUMIN: 4.4 g/dL (ref 3.5–5.0)
ALK PHOS: 97 U/L (ref 38–126)
ALT: 11 U/L — ABNORMAL LOW (ref 17–63)
ANION GAP: 12 (ref 5–15)
AST: 16 U/L (ref 15–41)
BILIRUBIN TOTAL: 0.9 mg/dL (ref 0.3–1.2)
BUN: 11 mg/dL (ref 6–20)
CALCIUM: 9.1 mg/dL (ref 8.9–10.3)
CO2: 27 mmol/L (ref 22–32)
Chloride: 98 mmol/L — ABNORMAL LOW (ref 101–111)
Creatinine, Ser: 0.97 mg/dL (ref 0.61–1.24)
GFR calc Af Amer: >60 mL/min (ref >60)
GFR calc non Af Amer: >60 mL/min (ref >60)
GLUCOSE: 112 mg/dL — AB (ref 65–99)
Potassium: 2.9 mmol/L — ABNORMAL LOW (ref 3.5–5.1)
Sodium: 137 mmol/L (ref 135–145)
TOTAL PROTEIN: 6.5 g/dL (ref 6.5–8.1)

## 2015-09-30 LAB — URINE MICROSCOPIC-ADD ON

## 2015-09-30 LAB — CBC
HCT: 40.6 % (ref 39.0–52.0)
Hemoglobin: 14 g/dL (ref 13.0–17.0)
MCH: 29.3 pg (ref 26.0–34.0)
MCHC: 34.5 g/dL (ref 30.0–36.0)
MCV: 84.9 fL (ref 78.0–100.0)
Platelets: 194 10*3/uL (ref 150–400)
RBC: 4.78 MIL/uL (ref 4.22–5.81)
RDW: 13 % (ref 11.5–15.5)
WBC: 6.6 10*3/uL (ref 4.0–10.5)

## 2015-09-30 LAB — CBG MONITORING, ED: Glucose-Capillary: 118 mg/dL — ABNORMAL HIGH (ref 65–99)

## 2015-09-30 MED ORDER — PROMETHAZINE HCL 25 MG PO TABS: 25.0000 mg | ORAL_TABLET | Freq: Four times a day (QID) | ORAL | Status: AC | PRN

## 2015-09-30 MED ORDER — PROMETHAZINE HCL 25 MG/ML IJ SOLN
12.5000 mg | Freq: Once | INTRAMUSCULAR | Status: AC
  Administered 2015-09-30: 12.5 mg via INTRAVENOUS
  Filled 2015-09-30: qty 1

## 2015-09-30 MED ORDER — POTASSIUM CHLORIDE CRYS ER 20 MEQ PO TBCR
40.0000 meq | EXTENDED_RELEASE_TABLET | Freq: Once | ORAL | Status: AC
  Administered 2015-09-30: 40 meq via ORAL
  Filled 2015-09-30: qty 2

## 2015-09-30 MED ORDER — SODIUM CHLORIDE 0.9 % IV BOLUS (SEPSIS)
500.0000 mL | Freq: Once | INTRAVENOUS | Status: AC
  Administered 2015-09-30: 500 mL via INTRAVENOUS

## 2015-09-30 MED ORDER — POTASSIUM CHLORIDE ER 10 MEQ PO TBCR: 10.0000 meq | EXTENDED_RELEASE_TABLET | Freq: Two times a day (BID) | ORAL | Status: AC

## 2015-09-30 MED ORDER — SODIUM CHLORIDE 0.9 % IV SOLN
INTRAVENOUS | Status: DC
  Administered 2015-09-30: 100 mL/h via INTRAVENOUS

## 2015-09-30 MED ORDER — HYDROMORPHONE HCL 1 MG/ML IJ SOLN
1.0000 mg | Freq: Once | INTRAMUSCULAR | Status: AC
  Administered 2015-09-30: 1 mg via INTRAVENOUS
  Filled 2015-09-30: qty 1

## 2015-09-30 NOTE — ED Notes (Signed)
Pt verbalized understanding of d/c instructions and has no further questions. Pt stable and NAD. Pt d/c home with wife driving 

## 2015-09-30 NOTE — Discharge Instructions (Signed)
Hypokalemia Hypokalemia means that the amount of potassium in the blood is lower than normal.Potassium is a chemical, called an electrolyte, that helps regulate the amount of fluid in the body. It also stimulates muscle contraction and helps nerves function properly.Most of the body's potassium is inside of cells, and only a very small amount is in the blood. Because the amount in the blood is so small, minor changes can be life-threatening. CAUSES  Antibiotics.  Diarrhea or vomiting.  Using laxatives too much, which can cause diarrhea.  Chronic kidney disease.  Water pills (diuretics).  Eating disorders (bulimia).  Low magnesium level.  Sweating a lot. SIGNS AND SYMPTOMS  Weakness.  Constipation.  Fatigue.  Muscle cramps.  Mental confusion.  Skipped heartbeats or irregular heartbeat (palpitations).  Tingling or numbness. DIAGNOSIS  Your health care provider can diagnose hypokalemia with blood tests. In addition to checking your potassium level, your health care provider may also check other lab tests. TREATMENT Hypokalemia can be treated with potassium supplements taken by mouth or adjustments in your current medicines. If your potassium level is very low, you may need to get potassium through a vein (IV) and be monitored in the hospital. A diet high in potassium is also helpful. Foods high in potassium are:  Nuts, such as peanuts and pistachios.  Seeds, such as sunflower seeds and pumpkin seeds.  Peas, lentils, and lima beans.  Whole grain and bran cereals and breads.  Fresh fruit and vegetables, such as apricots, avocado, bananas, cantaloupe, kiwi, oranges, tomatoes, asparagus, and potatoes.  Orange and tomato juices.  Red meats.  Fruit yogurt. HOME CARE INSTRUCTIONS  Take all medicines as prescribed by your health care provider.  Maintain a healthy diet by including nutritious food, such as fruits, vegetables, nuts, whole grains, and lean meats.  If  you are taking a laxative, be sure to follow the directions on the label. SEEK MEDICAL CARE IF:  Your weakness gets worse.  You feel your heart pounding or racing.  You are vomiting or having diarrhea.  You are diabetic and having trouble keeping your blood glucose in the normal range. SEEK IMMEDIATE MEDICAL CARE IF:  You have chest pain, shortness of breath, or dizziness.  You are vomiting or having diarrhea for more than 2 days.  You faint. MAKE SURE YOU:   Understand these instructions.  Will watch your condition.  Will get help right away if you are not doing well or get worse.   This information is not intended to replace advice given to you by your health care provider. Make sure you discuss any questions you have with your health care provider.   Make a plan with the follow-up of with your doctor. Continue your workup that your oncologist have planned that [place]. Return for any new or worse symptoms. Take the potassium supplement as directed for the next 3 days.   Document Released: 08/05/2005 Document Revised: 08/26/2014 Document Reviewed: 02/05/2013 Elsevier Interactive Patient Education [employer].

## 2015-09-30 NOTE — ED Provider Notes (Signed)
CSN: [id number]     Arrival date & time 09/30/15  1442 History   First MD Initiated Contact with Patient 09/30/15 1624     Chief Complaint  Patient presents with  . Altered Mental Status  . Weakness     (Consider location/radiation/quality/duration/timing/severity/associated sxs/prior Treatment) Patient is a 60 y.o. male presenting with altered mental status and weakness. The history is provided by the patient and the spouse.  Altered Mental Status Presenting symptoms: confusion   Associated symptoms: abdominal pain, nausea, vomiting and weakness   Associated symptoms: no fever, no headaches and no rash   Weakness Associated symptoms include abdominal pain. Pertinent negatives include no chest pain, no headaches and no shortness of breath.   patient with fourth visit either here at [facility] regional for same complaint. To include abdominal pain nausea vomiting diarrhea altered mental status confusion intermittently. Patient recently seen February 7 and discharged home. Patient has had a head CT several times abdominal CT. Without any acute findings. Patient alert here and cooperative and follows commands. Patient the main concern is some persistent nausea and vomiting. Helped mostly he says by [employer]. Patient also with chronic pain. Usually helped with hydromorphone. Patient's had difficulties with low potassium in the past. Patient's admission to [facility] and previous ED visits reviewed. Patient has a history of lymphoma. Patient followed by hematology oncology at [facility] further workup pending by them.  Past Medical History  Diagnosis Date  . COPD (chronic obstructive pulmonary disease) ([place])   . Migraine   . Asthma   . BPH (benign prostatic hyperplasia)   . Deviated nasal septum   . DVT (deep venous thrombosis) ([place])   . Erosive esophagitis   . Mantle cell lymphoma [facility])    Past Surgical History  Procedure Laterality Date  . Throat surgery    . Barryx N/A   .  Esophagogastroduodenoscopy (egd) with propofol N/A 02/23/2015    Procedure: ESOPHAGOGASTRODUODENOSCOPY (EGD) WITH PROPOFOL;  Surgeon: Hulen Luster, MD;  Location: [facility] ENDOSCOPY;  Service: Gastroenterology;  Laterality: N/A;  . Esophagogastroduodenoscopy (egd) with propofol N/A 05/04/2015    Procedure: ESOPHAGOGASTRODUODENOSCOPY (EGD) WITH PROPOFOL;  Surgeon: Hulen Luster, MD;  Location: [facility] ENDOSCOPY;  Service: Gastroenterology;  Laterality: N/A;  . Tonsillectomy    . Esophagogastroduodenoscopy (egd) with propofol N/A 07/27/2015    Procedure: ESOPHAGOGASTRODUODENOSCOPY (EGD) WITH PROPOFOL;  Surgeon: Hulen Luster, MD;  Location: [facility] ENDOSCOPY;  Service: Gastroenterology;  Laterality: N/A;   Family History  Problem Relation Age of Onset  . Mesothelioma Father   . Breast cancer Mother    Social History  Substance Use Topics  . Smoking status: Former Research scientist (life sciences)  . Smokeless tobacco: None  . Alcohol Use: No    Review of Systems  Constitutional: Positive for fatigue. Negative for fever.  HENT: Negative for congestion.   Eyes: Negative for visual disturbance.  Respiratory: Negative for shortness of breath.   Cardiovascular: Negative for chest pain.  Gastrointestinal: Positive for nausea, vomiting, abdominal pain and diarrhea.  Genitourinary: Negative for dysuria.  Musculoskeletal: Positive for myalgias.  Skin: Negative for rash.  Neurological: Positive for weakness. Negative for numbness and headaches.  Hematological: Bruises/bleeds easily.  Psychiatric/Behavioral: Positive for confusion.      Allergies  Bee venom and Ativan  Home Medications   Prior to Admission medications   Medication Sig Start Date End Date Taking? Authorizing Provider  albuterol (PROAIR HFA) 108 (90 Base) MCG/ACT inhaler Inhale 1-2 puffs into the lungs every 4 (four) hours as needed for  wheezing or shortness of breath.    Yes Historical Provider, MD  cyanocobalamin 500 MCG tablet Take 500 mcg by mouth daily.   Yes  Historical Provider, MD  docusate sodium (COLACE) 100 MG capsule Take 200 mg by mouth 2 (two) times daily as needed for mild constipation.   Yes Historical Provider, MD  loperamide (IMODIUM) 2 MG capsule Take 2 mg by mouth as needed for diarrhea or loose stools.   Yes Historical Provider, MD  morphine (MSIR) 30 MG tablet Take 30 mg by mouth 3 (three) times daily.   Yes Historical Provider, MD  Omeprazole (RA OMEPRAZOLE) 20 MG TBEC Take 20 mg by mouth 2 (two) times daily.   Yes Historical Provider, MD  rivaroxaban (XARELTO) 20 MG TABS tablet Take 20 mg by mouth daily with supper.   Yes Historical Provider, MD  senna (SENOKOT) 8.6 MG TABS tablet Take 2 tablets by mouth 2 (two) times daily as needed for mild constipation.   Yes Historical Provider, MD  tamsulosin (FLOMAX) 0.4 MG CAPS capsule Take 0.4 mg by mouth at bedtime.   Yes Historical Provider, MD  traZODone (DESYREL) 50 MG tablet Take 50 mg by mouth at bedtime.   Yes Historical Provider, MD  potassium chloride (K-DUR) 10 MEQ tablet Take 1 tablet (10 mEq total) by mouth 2 (two) times daily. 09/30/15   Fredia Sorrow, MD  SUMAtriptan (IMITREX) 50 MG tablet Take 50 mg by mouth every 2 (two) hours as needed for migraine.     Historical Provider, MD   BP 119/56 mmHg  Pulse 73  Temp(Src) 98 F (36.7 C) (Oral)  Resp 14  SpO2 99% Physical Exam  Constitutional: He is oriented to person, place, and time. He appears well-developed and well-nourished. No distress.  HENT:  Head: Normocephalic and atraumatic.  Eyes: Conjunctivae and EOM are normal. Pupils are equal, round, and reactive to light.  Neck: Normal range of motion. Neck supple.  Cardiovascular: Normal rate, regular rhythm and normal heart sounds.   No murmur heard. Pulmonary/Chest: Effort normal and breath sounds normal. No respiratory distress.  Abdominal: Soft. Bowel sounds are normal. There is tenderness.  Overlies mild abdominal tenderness no guarding no distention.   Musculoskeletal: Normal range of motion.  Neurological: He is alert and oriented to person, place, and time. No cranial nerve deficit. He exhibits normal muscle tone. Coordination normal.  Skin: Skin is warm.  Nursing note and vitals reviewed.   ED Course  Procedures (including critical care time) Labs Review Labs Reviewed  COMPREHENSIVE METABOLIC PANEL - Abnormal; Notable for the following:    Potassium 2.9 (*)    Chloride 98 (*)    Glucose, Bld 112 (*)    ALT 11 (*)    All other components within normal limits  URINALYSIS, ROUTINE W REFLEX MICROSCOPIC (NOT AT [facility]) - Abnormal; Notable for the following:    Color, Urine AMBER (*)    APPearance CLOUDY (*)    Leukocytes, UA TRACE (*)    All other components within normal limits  URINE MICROSCOPIC-ADD ON - Abnormal; Notable for the following:    Squamous Epithelial / LPF 0-5 (*)    Bacteria, UA RARE (*)    Casts HYALINE CASTS (*)    All other components within normal limits  CBG MONITORING, ED - Abnormal; Notable for the following:    Glucose-Capillary 118 (*)    All other components within normal limits  CBC   Results for orders placed or performed during the hospital encounter  of 09/30/15  Comprehensive metabolic panel  Result Value Ref Range   Sodium 137 135 - 145 mmol/L   Potassium 2.9 (L) 3.5 - 5.1 mmol/L   Chloride 98 (L) 101 - 111 mmol/L   CO2 27 22 - 32 mmol/L   Glucose, Bld 112 (H) 65 - 99 mg/dL   BUN 11 6 - 20 mg/dL   Creatinine, Ser 0.97 0.61 - 1.24 mg/dL   Calcium 9.1 8.9 - 10.3 mg/dL   Total Protein 6.5 6.5 - 8.1 g/dL   Albumin 4.4 3.5 - 5.0 g/dL   AST 16 15 - 41 U/L   ALT 11 (L) 17 - 63 U/L   Alkaline Phosphatase 97 38 - 126 U/L   Total Bilirubin 0.9 0.3 - 1.2 mg/dL   GFR calc non Af Amer >60 >60 mL/min   GFR calc Af Amer >60 >60 mL/min   Anion gap 12 5 - 15  CBC  Result Value Ref Range   WBC 6.6 4.0 - 10.5 K/uL   RBC 4.78 4.22 - 5.81 MIL/uL   Hemoglobin 14.0 13.0 - 17.0 g/dL   HCT 40.6 39.0 -  52.0 %   MCV 84.9 78.0 - 100.0 fL   MCH 29.3 26.0 - 34.0 pg   MCHC 34.5 30.0 - 36.0 g/dL   RDW 13.0 11.5 - 15.5 %   Platelets 194 150 - 400 K/uL  Urinalysis, Routine w reflex microscopic (not at [facility])  Result Value Ref Range   Color, Urine AMBER (A) YELLOW   APPearance CLOUDY (A) CLEAR   Specific Gravity, Urine 1.027 1.005 - 1.030   pH 5.5 5.0 - 8.0   Glucose, UA NEGATIVE NEGATIVE mg/dL   Hgb urine dipstick NEGATIVE NEGATIVE   Bilirubin Urine NEGATIVE NEGATIVE   Ketones, ur NEGATIVE NEGATIVE mg/dL   Protein, ur NEGATIVE NEGATIVE mg/dL   Nitrite NEGATIVE NEGATIVE   Leukocytes, UA TRACE (A) NEGATIVE  Urine microscopic-add on  Result Value Ref Range   Squamous Epithelial / LPF 0-5 (A) NONE SEEN   WBC, UA 0-5 0 - 5 WBC/hpf   RBC / HPF 0-5 0 - 5 RBC/hpf   Bacteria, UA RARE (A) NONE SEEN   Casts HYALINE CASTS (A) NEGATIVE   Urine-Other MUCOUS PRESENT   CBG monitoring, ED  Result Value Ref Range   Glucose-Capillary 118 (H) 65 - 99 mg/dL   Comment 1 Notify RN     Imaging Review Ct Head Wo Contrast  09/30/2015  CLINICAL DATA:  60 year old with 4 day history of progressively worsening generalized weakness and confusion. At least 2 falls during this past week due to the weakness. Patient seen in the emergency department 09/26/2015 for the same symptoms. EXAM: CT HEAD WITHOUT CONTRAST TECHNIQUE: Contiguous axial images were obtained from the base of the skull through the vertex without intravenous contrast. COMPARISON:  09/24/2015 dating back to 08/29/2014. MRI brain 08/10/2015. FINDINGS: Ventricular system normal in size and appearance for age. Dystrophic calcification in the anterior left thalamus, unchanged. No mass lesion. No midline shift. No acute hemorrhage or hematoma. No extra-axial fluid collections. No evidence of acute infarction. No significant interval change. No skull fracture or other focal osseous abnormality involving the skull. Visualized paranasal sinuses, bilateral mastoid  air cells and bilateral middle ear cavities well-aerated. Mild to moderate bilateral carotid siphon and left vertebral artery atherosclerosis. IMPRESSION: No acute intracranial abnormality. Electronically Signed   By: Evangeline Dakin M.D.   On: 09/30/2015 17:35   I have personally reviewed and evaluated  these images and lab results as part of my medical decision-making.   EKG Interpretation None      MDM   Final diagnoses:  Hypokalemia    Patient workup for the altered mental status without any acute findings. Patient has had several workups for this, to include multiple head CTs including today. Also abdominal CT. Patient followed by hematology oncology and [place] that is further evaluating his history of cancer. Patient appears to be baseline is able to follow commands here. No acute distress nontoxic. Will treat the low potassium patient given 40 mg of potassium orally here and will be continued on potassium for the next 3 days. Patient will make a plan to follow-up with his regular doctor.    Fredia Sorrow, MD 09/30/15 [phone]

## 2015-09-30 NOTE — ED Notes (Addendum)
Pt c/o 4 day history of worsening weakness and confusion. Pt was seen here Tuesday for same complaint. Family states the pt is shaking and having trouble walking due to weakness. He has fallen a couple of times this week. He denies any injuries from the falls. Family states the pt has been confused about who people are and what time it is. Pt states his legs are hurting now but he has chronic pain. Pt is alert but confused, he attempts to follow commands but can not answer most questions appropriately and is restless and fidgety during assessment. Pt has had some episodes of vomiting this week.

## 2015-10-01 ENCOUNTER — Encounter: Payer: Self-pay | Admitting: Emergency Medicine

## 2015-10-01 ENCOUNTER — Emergency Department: Payer: Medicaid Other

## 2015-10-01 ENCOUNTER — Emergency Department
Admission: EM | Admit: 2015-10-01 | Discharge: 2015-10-01 | Disposition: A | Discharge status: Home / Self Care | Payer: Medicaid Other | Attending: Emergency Medicine | Admitting: Emergency Medicine

## 2015-10-01 DIAGNOSIS — Z79891 Long term (current) use of opiate analgesic: Secondary | ICD-10-CM | POA: Insufficient documentation

## 2015-10-01 DIAGNOSIS — Y92009 Unspecified place in unspecified non-institutional (private) residence as the place of occurrence of the external cause: Secondary | ICD-10-CM | POA: Insufficient documentation

## 2015-10-01 DIAGNOSIS — E860 Dehydration: Secondary | ICD-10-CM | POA: Insufficient documentation

## 2015-10-01 DIAGNOSIS — R531 Weakness: Secondary | ICD-10-CM | POA: Diagnosis not present

## 2015-10-01 DIAGNOSIS — S199XXA Unspecified injury of neck, initial encounter: Secondary | ICD-10-CM | POA: Insufficient documentation

## 2015-10-01 DIAGNOSIS — Y9389 Activity, other specified: Secondary | ICD-10-CM | POA: Diagnosis not present

## 2015-10-01 DIAGNOSIS — Y998 Other external cause status: Secondary | ICD-10-CM | POA: Insufficient documentation

## 2015-10-01 DIAGNOSIS — W1839XA Other fall on same level, initial encounter: Secondary | ICD-10-CM | POA: Diagnosis not present

## 2015-10-01 DIAGNOSIS — Z87891 Personal history of nicotine dependence: Secondary | ICD-10-CM | POA: Insufficient documentation

## 2015-10-01 DIAGNOSIS — Z79899 Other long term (current) drug therapy: Secondary | ICD-10-CM | POA: Diagnosis not present

## 2015-10-01 DIAGNOSIS — R4182 Altered mental status, unspecified: Secondary | ICD-10-CM | POA: Diagnosis present

## 2015-10-01 LAB — URINALYSIS COMPLETE WITH MICROSCOPIC (ARMC ONLY)
BACTERIA UA: NONE SEEN
BILIRUBIN URINE: NEGATIVE
Glucose, UA: NEGATIVE mg/dL
Hgb urine dipstick: NEGATIVE
KETONES UR: NEGATIVE mg/dL
Leukocytes, UA: NEGATIVE
Nitrite: NEGATIVE
PH: 6 (ref 5.0–8.0)
Protein, ur: NEGATIVE mg/dL
SPECIFIC GRAVITY, URINE: 1.018 (ref 1.005–1.030)
SQUAMOUS EPITHELIAL / LPF: NONE SEEN

## 2015-10-01 LAB — CBC WITH DIFFERENTIAL/PLATELET
BASOS PCT: 1 %
Basophils Absolute: 0.1 10*3/uL (ref 0–0.1)
EOS PCT: 2 %
Eosinophils Absolute: 0.1 10*3/uL (ref 0–0.7)
HEMATOCRIT: 37.9 % — AB (ref 40.0–52.0)
Hemoglobin: 13.2 g/dL (ref 13.0–18.0)
LYMPHS PCT: 36 %
Lymphs Abs: 2.4 10*3/uL (ref 1.0–3.6)
MCH: 29.8 pg (ref 26.0–34.0)
MCHC: 34.8 g/dL (ref 32.0–36.0)
MCV: 85.6 fL (ref 80.0–100.0)
MONO ABS: 0.5 10*3/uL (ref 0.2–1.0)
MONOS PCT: 8 %
NEUTROS ABS: 3.5 10*3/uL (ref 1.4–6.5)
Neutrophils Relative %: 53 %
Platelets: 169 10*3/uL (ref 150–440)
RBC: 4.43 MIL/uL (ref 4.40–5.90)
RDW: 13.9 % (ref 11.5–14.5)
WBC: 6.6 10*3/uL (ref 3.8–10.6)

## 2015-10-01 LAB — COMPREHENSIVE METABOLIC PANEL
ALBUMIN: 4.1 g/dL (ref 3.5–5.0)
ALK PHOS: 91 U/L (ref 38–126)
ALT: 13 U/L — AB (ref 17–63)
AST: 21 U/L (ref 15–41)
Anion gap: 9 (ref 5–15)
BILIRUBIN TOTAL: 0.6 mg/dL (ref 0.3–1.2)
BUN: 17 mg/dL (ref 6–20)
CALCIUM: 8.7 mg/dL — AB (ref 8.9–10.3)
CO2: 25 mmol/L (ref 22–32)
CREATININE: 0.87 mg/dL (ref 0.61–1.24)
Chloride: 104 mmol/L (ref 101–111)
GFR calc Af Amer: >60 mL/min (ref >60)
GFR calc non Af Amer: >60 mL/min (ref >60)
GLUCOSE: 103 mg/dL — AB (ref 65–99)
Potassium: 3.8 mmol/L (ref 3.5–5.1)
SODIUM: 138 mmol/L (ref 135–145)
TOTAL PROTEIN: 6.5 g/dL (ref 6.5–8.1)

## 2015-10-01 LAB — ACETAMINOPHEN LEVEL: Acetaminophen (Tylenol), Serum: <10 ug/mL — ABNORMAL LOW (ref 10–30)

## 2015-10-01 LAB — LIPASE, BLOOD: Lipase: 18 U/L (ref 11–51)

## 2015-10-01 LAB — ETHANOL: Alcohol, Ethyl (B): <5 mg/dL (ref <5)

## 2015-10-01 LAB — SALICYLATE LEVEL: Salicylate Lvl: <4 mg/dL (ref 2.8–30.0)

## 2015-10-01 MED ORDER — SODIUM CHLORIDE 0.9 % IV BOLUS (SEPSIS)
1000.0000 mL | Freq: Once | INTRAVENOUS | Status: AC
  Administered 2015-10-01: 1000 mL via INTRAVENOUS

## 2015-10-01 MED ORDER — PROMETHAZINE HCL 25 MG PO TABS
12.5000 mg | ORAL_TABLET | Freq: Once | ORAL | Status: AC
  Administered 2015-10-01: 12.5 mg via ORAL

## 2015-10-01 MED ORDER — PROMETHAZINE HCL 25 MG PO TABS
ORAL_TABLET | ORAL | Status: AC
  Filled 2015-10-01: qty 1

## 2015-10-01 NOTE — ED Notes (Addendum)
Pt had unwitnessed fall today. In c collar by ems. Agitated while triaging and does not want to sit still.

## 2015-10-01 NOTE — ED Provider Notes (Signed)
[facility] Emergency Department Provider Note  ____________________________________________  Time seen: 11:30 AM  I have reviewed the triage vital signs and the nursing notes.   HISTORY  Chief Complaint Altered Mental Status    HPI Nochum Akridge Blish is a 60 y.o. male brought to the ED due to confusion and generalized weakness. He's had a few falls at home. He follows up with Dr. Humphrey Rolls and the [facility].No other acute symptoms or acute illness. He's been having these issues over the past several months, worse over last 3 weeks.     Past Medical History  Diagnosis Date  . COPD (chronic obstructive pulmonary disease) ([place])   . Migraine   . Asthma   . BPH (benign prostatic hyperplasia)   . Deviated nasal septum   . DVT (deep venous thrombosis) ([place])   . Erosive esophagitis   . Mantle cell lymphoma [facility])      Patient Active Problem List   Diagnosis Date Noted  . Orthostatic hypotension 09/28/2015  . Vasovagal syncope 09/28/2015  . Deep vein thrombosis (DVT) of distal vein of lower extremity ([place]) 09/28/2015  . Hypokalemia 09/25/2015  . Gastroenteritis, acute 09/25/2015  . Gastroenteritis 09/25/2015  . Pressure ulcer 08/18/2015  . HCAP (healthcare-associated pneumonia) 08/17/2015  . Syncope and collapse 08/10/2015  . Syncope 08/10/2015  . Protein-calorie malnutrition, severe 08/10/2015  . Abdominal pain 07/17/2015  . Nausea & vomiting 07/17/2015  . Malnutrition of moderate degree 07/17/2015  . Diarrhea 05/18/2015  . DEVIATED SEPTUM 05/18/2007  . ALLERGIC RHINITIS 05/18/2007  . ASTHMA 05/18/2007     Past Surgical History  Procedure Laterality Date  . Throat surgery    . Barryx N/A   . Esophagogastroduodenoscopy (egd) with propofol N/A 02/23/2015    Procedure: ESOPHAGOGASTRODUODENOSCOPY (EGD) WITH PROPOFOL;  Surgeon: Hulen Luster, MD;  Location: [facility] ENDOSCOPY;  Service: Gastroenterology;  Laterality: N/A;  . Esophagogastroduodenoscopy (egd) with  propofol N/A 05/04/2015    Procedure: ESOPHAGOGASTRODUODENOSCOPY (EGD) WITH PROPOFOL;  Surgeon: Hulen Luster, MD;  Location: [facility] ENDOSCOPY;  Service: Gastroenterology;  Laterality: N/A;  . Tonsillectomy    . Esophagogastroduodenoscopy (egd) with propofol N/A 07/27/2015    Procedure: ESOPHAGOGASTRODUODENOSCOPY (EGD) WITH PROPOFOL;  Surgeon: Hulen Luster, MD;  Location: [facility] ENDOSCOPY;  Service: Gastroenterology;  Laterality: N/A;     Current Outpatient Rx  Name  Route  Sig  Dispense  Refill  . albuterol (PROAIR HFA) 108 (90 Base) MCG/ACT inhaler   Inhalation   Inhale 1-2 puffs into the lungs every 4 (four) hours as needed for wheezing or shortness of breath.          . cyanocobalamin 500 MCG tablet   Oral   Take 500 mcg by mouth daily.         Marland Kitchen docusate sodium (COLACE) 100 MG capsule   Oral   Take 200 mg by mouth 2 (two) times daily as needed for mild constipation.         Marland Kitchen loperamide (IMODIUM) 2 MG capsule   Oral   Take 2 mg by mouth as needed for diarrhea or loose stools.         Marland Kitchen morphine (MSIR) 30 MG tablet   Oral   Take 30 mg by mouth 3 (three) times daily.         . Omeprazole (RA OMEPRAZOLE) 20 MG TBEC   Oral   Take 20 mg by mouth 2 (two) times daily.         . potassium chloride (  K-DUR) 10 MEQ tablet   Oral   Take 1 tablet (10 mEq total) by mouth 2 (two) times daily.   6 tablet   0   . promethazine (PHENERGAN) 25 MG tablet   Oral   Take 1 tablet (25 mg total) by mouth every 6 (six) hours as needed for nausea or vomiting.   12 tablet   1   . rivaroxaban (XARELTO) 20 MG TABS tablet   Oral   Take 20 mg by mouth daily with supper.         . senna (SENOKOT) 8.6 MG TABS tablet   Oral   Take 2 tablets by mouth 2 (two) times daily as needed for mild constipation.         . SUMAtriptan (IMITREX) 50 MG tablet   Oral   Take 50 mg by mouth every 2 (two) hours as needed for migraine.          . tamsulosin (FLOMAX) 0.4 MG CAPS capsule   Oral   Take  0.4 mg by mouth at bedtime.         . traZODone (DESYREL) 50 MG tablet   Oral   Take 50 mg by mouth at bedtime.            Allergies Bee venom and Ativan   Family History  Problem Relation Age of Onset  . Mesothelioma Father   . Breast cancer Mother     Social History Social History  Substance Use Topics  . Smoking status: Former Research scientist (life sciences)  . Smokeless tobacco: None  . Alcohol Use: No    Review of Systems  Constitutional:   No fever or chills. No weight changes Eyes:   No blurry vision or double vision.  ENT:   No sore throat. Cardiovascular:   No chest pain. Respiratory:   No dyspnea or cough. Gastrointestinal:   Negative for abdominal pain, vomiting and diarrhea.  No BRBPR or melena. Genitourinary:   Negative for dysuria, urinary retention, bloody urine, or difficulty urinating. Musculoskeletal:   Negative for back pain. No joint swelling or pain. Complains of neck pain after falling yesterday Skin:   Negative for rash. Neurological:   Negative for headaches, focal weakness or numbness. Psychiatric:  No anxiety or depression.   Endocrine:  No hot/cold intolerance, changes in energy, or sleep difficulty.  10-point ROS otherwise negative.  ____________________________________________   PHYSICAL EXAM:  VITAL SIGNS: ED Triage Vitals  Enc Vitals Group     BP 10/01/15 1019 139/91 mmHg     Pulse Rate 10/01/15 1022 75     Resp 10/01/15 1019 16     Temp 10/01/15 1019 98.1 F (36.7 C)     Temp Source 10/01/15 1019 Oral     SpO2 10/01/15 1022 95 %     Weight 10/01/15 1019 166 lb (75.297 kg)     Height 10/01/15 1019 5\' 6"  (1.676 m)     Head Cir --      Peak Flow --      Pain Score --      Pain Loc --      Pain Edu? --      Excl. in [place]? --     Vital signs reviewed, nursing assessments reviewed.   Constitutional:   Alert and oriented. Well appearing and in no distress. Eyes:   No scleral icterus. No conjunctival pallor. PERRL. EOMI ENT   Head:    Normocephalic and atraumatic.   Nose:   No congestion/rhinnorhea. No  septal hematoma   Mouth/Throat:   MMM, no pharyngeal erythema. No peritonsillar mass. No uvula shift.   Neck:   No stridor. No SubQ emphysema. No meningismus. In c-collar. Midline spinal tenderness around C6. Hematological/Lymphatic/Immunilogical:   No cervical lymphadenopathy. Cardiovascular:   RRR. Normal and symmetric distal pulses are present in all extremities. No murmurs, rubs, or gallops. Respiratory:   Normal respiratory effort without tachypnea nor retractions. Breath sounds are clear and equal bilaterally. No wheezes/rales/rhonchi. Gastrointestinal:   Soft and nontender. No distention. There is no CVA tenderness.  No rebound, rigidity, or guarding. Genitourinary:   deferred Musculoskeletal:   Nontender with normal range of motion in all extremities. No joint effusions.  No lower extremity tenderness.  No edema. Neurologic:   Normal speech and language.  CN 2-10 normal. Motor grossly intact. No pronator drift.  Normal gait. No gross focal neurologic deficits are appreciated.  Skin:    Skin is warm, dry and intact. No rash noted.  No petechiae, purpura, or bullae. Psychiatric:   Mood and affect are normal. Speech and behavior are normal. Patient exhibits appropriate insight and judgment.  ____________________________________________    LABS (pertinent positives/negatives) (all labs ordered are listed, but only abnormal results are displayed) Labs Reviewed  COMPREHENSIVE METABOLIC PANEL - Abnormal; Notable for the following:    Glucose, Bld 103 (*)    Calcium 8.7 (*)    ALT 13 (*)    All other components within normal limits  URINALYSIS COMPLETEWITH MICROSCOPIC ([facility] ONLY) - Abnormal; Notable for the following:    Color, Urine YELLOW (*)    APPearance CLEAR (*)    All other components within normal limits  CBC WITH DIFFERENTIAL/PLATELET - Abnormal; Notable for the following:    HCT 37.9 (*)     All other components within normal limits  ACETAMINOPHEN LEVEL - Abnormal; Notable for the following:    Acetaminophen (Tylenol), Serum <10 (*)    All other components within normal limits  ETHANOL  SALICYLATE LEVEL  LIPASE, BLOOD   ____________________________________________   EKG    ____________________________________________    RADIOLOGY  CT head unremarkable CT cervical spine unremarkable  ____________________________________________   PROCEDURES   ____________________________________________   INITIAL IMPRESSION / ASSESSMENT AND PLAN / ED COURSE  Pertinent labs & imaging results that were available during my care of the patient were reviewed by me and considered in my medical decision making (see chart for details).  Patient presents with chronic generalized weakness as well as a few recent falls. No evidence of any musculoskeletal injury except for the tenderness on the C-spine. We'll get CT head and neck. Check labs as well. IV fluids.  ----------------------------------------- 3:17 PM on 10/01/2015 -----------------------------------------  Workup negative. Patient remains oriented and lucid. Calm and comfortable with unremarkable vital signs in the emergency department today. Hypokalemia that he had on his ER visit yesterday has resolved. We'll discharge home.     ____________________________________________   FINAL CLINICAL IMPRESSION(S) / ED DIAGNOSES  Final diagnoses:  Dehydration  Generalized weakness      Carrie Mew, MD 10/01/15 1517

## 2015-10-01 NOTE — ED Notes (Signed)
Pt discharged to home.  Family member driving.  Discharge instructions reviewed.  Verbalized understanding.  No questions or concerns at this time.  Teach back verified.  Pt in NAD.  No items left in ED.   

## 2015-10-01 NOTE — ED Notes (Signed)
Pt took a couple swings at wife this morning per report. Trying to get out of bed. Seeming to calm down with redirection

## 2015-10-01 NOTE — ED Notes (Signed)
Pt has had intermittent AMS over last week per family.  Patient normally A&O X 4 but will forget where he is or what is going on. Today he put cat litter in dog food bowl. Oriented only to person per ems.

## 2015-10-01 NOTE — Discharge Instructions (Signed)

## 2015-10-02 ENCOUNTER — Telehealth: Payer: Self-pay

## 2015-10-02 ENCOUNTER — Emergency Department: Payer: Medicaid Other

## 2015-10-02 ENCOUNTER — Other Ambulatory Visit: Payer: Self-pay

## 2015-10-02 ENCOUNTER — Observation Stay
Admission: EM | Admit: 2015-10-02 | Discharge: 2015-10-04 | Disposition: A | Discharge status: Home / Self Care | Payer: Medicaid Other | Attending: Internal Medicine | Admitting: Internal Medicine

## 2015-10-02 ENCOUNTER — Observation Stay: Payer: Medicaid Other

## 2015-10-02 ENCOUNTER — Encounter: Payer: Self-pay | Admitting: Emergency Medicine

## 2015-10-02 DIAGNOSIS — N400 Enlarged prostate without lower urinary tract symptoms: Secondary | ICD-10-CM | POA: Diagnosis present

## 2015-10-02 DIAGNOSIS — I739 Peripheral vascular disease, unspecified: Secondary | ICD-10-CM | POA: Insufficient documentation

## 2015-10-02 DIAGNOSIS — R531 Weakness: Secondary | ICD-10-CM | POA: Diagnosis not present

## 2015-10-02 DIAGNOSIS — I639 Cerebral infarction, unspecified: Secondary | ICD-10-CM

## 2015-10-02 DIAGNOSIS — Z888 Allergy status to other drugs, medicaments and biological substances status: Secondary | ICD-10-CM | POA: Insufficient documentation

## 2015-10-02 DIAGNOSIS — I951 Orthostatic hypotension: Secondary | ICD-10-CM | POA: Insufficient documentation

## 2015-10-02 DIAGNOSIS — I82509 Chronic embolism and thrombosis of unspecified deep veins of unspecified lower extremity: Secondary | ICD-10-CM | POA: Diagnosis not present

## 2015-10-02 DIAGNOSIS — J449 Chronic obstructive pulmonary disease, unspecified: Secondary | ICD-10-CM | POA: Insufficient documentation

## 2015-10-02 DIAGNOSIS — Z7901 Long term (current) use of anticoagulants: Secondary | ICD-10-CM | POA: Diagnosis not present

## 2015-10-02 DIAGNOSIS — K219 Gastro-esophageal reflux disease without esophagitis: Secondary | ICD-10-CM | POA: Diagnosis not present

## 2015-10-02 DIAGNOSIS — Z8572 Personal history of non-Hodgkin lymphomas: Secondary | ICD-10-CM | POA: Insufficient documentation

## 2015-10-02 DIAGNOSIS — R4182 Altered mental status, unspecified: Secondary | ICD-10-CM | POA: Diagnosis not present

## 2015-10-02 DIAGNOSIS — I708 Atherosclerosis of other arteries: Secondary | ICD-10-CM | POA: Insufficient documentation

## 2015-10-02 DIAGNOSIS — R55 Syncope and collapse: Secondary | ICD-10-CM | POA: Diagnosis not present

## 2015-10-02 DIAGNOSIS — G9340 Encephalopathy, unspecified: Secondary | ICD-10-CM | POA: Diagnosis not present

## 2015-10-02 DIAGNOSIS — R410 Disorientation, unspecified: Secondary | ICD-10-CM | POA: Diagnosis present

## 2015-10-02 DIAGNOSIS — J45909 Unspecified asthma, uncomplicated: Secondary | ICD-10-CM | POA: Diagnosis not present

## 2015-10-02 DIAGNOSIS — Z87891 Personal history of nicotine dependence: Secondary | ICD-10-CM | POA: Diagnosis not present

## 2015-10-02 DIAGNOSIS — M25561 Pain in right knee: Secondary | ICD-10-CM | POA: Insufficient documentation

## 2015-10-02 DIAGNOSIS — W19XXXA Unspecified fall, initial encounter: Secondary | ICD-10-CM | POA: Diagnosis not present

## 2015-10-02 DIAGNOSIS — R451 Restlessness and agitation: Secondary | ICD-10-CM | POA: Diagnosis present

## 2015-10-02 DIAGNOSIS — M85861 Other specified disorders of bone density and structure, right lower leg: Secondary | ICD-10-CM | POA: Insufficient documentation

## 2015-10-02 DIAGNOSIS — Z91030 Bee allergy status: Secondary | ICD-10-CM | POA: Insufficient documentation

## 2015-10-02 DIAGNOSIS — M25569 Pain in unspecified knee: Secondary | ICD-10-CM | POA: Diagnosis present

## 2015-10-02 DIAGNOSIS — Z86718 Personal history of other venous thrombosis and embolism: Secondary | ICD-10-CM

## 2015-10-02 DIAGNOSIS — J3489 Other specified disorders of nose and nasal sinuses: Secondary | ICD-10-CM | POA: Insufficient documentation

## 2015-10-02 HISTORY — DX: Gastro-esophageal reflux disease without esophagitis: K21.9

## 2015-10-02 LAB — COMPREHENSIVE METABOLIC PANEL
ALK PHOS: 100 U/L (ref 38–126)
ALT: 12 U/L — ABNORMAL LOW (ref 17–63)
ANION GAP: 7 (ref 5–15)
AST: 16 U/L (ref 15–41)
Albumin: 4.4 g/dL (ref 3.5–5.0)
BILIRUBIN TOTAL: 1.1 mg/dL (ref 0.3–1.2)
BUN: 7 mg/dL (ref 6–20)
CALCIUM: 8.7 mg/dL — AB (ref 8.9–10.3)
CO2: 28 mmol/L (ref 22–32)
Chloride: 107 mmol/L (ref 101–111)
Creatinine, Ser: 0.71 mg/dL (ref 0.61–1.24)
Glucose, Bld: 124 mg/dL — ABNORMAL HIGH (ref 65–99)
Potassium: 3.1 mmol/L — ABNORMAL LOW (ref 3.5–5.1)
SODIUM: 142 mmol/L (ref 135–145)
TOTAL PROTEIN: 6.6 g/dL (ref 6.5–8.1)

## 2015-10-02 LAB — URINE DRUG SCREEN, QUALITATIVE (ARMC ONLY)
AMPHETAMINES, UR SCREEN: NOT DETECTED
BARBITURATES, UR SCREEN: NOT DETECTED
Benzodiazepine, Ur Scrn: POSITIVE — AB
COCAINE METABOLITE, UR ~~LOC~~: NOT DETECTED
Cannabinoid 50 Ng, Ur ~~LOC~~: NOT DETECTED
MDMA (ECSTASY) UR SCREEN: NOT DETECTED
METHADONE SCREEN, URINE: NOT DETECTED
OPIATE, UR SCREEN: POSITIVE — AB
Phencyclidine (PCP) Ur S: POSITIVE — AB
TRICYCLIC, UR SCREEN: POSITIVE — AB

## 2015-10-02 LAB — CBC
HCT: 37.9 % — ABNORMAL LOW (ref 40.0–52.0)
HEMOGLOBIN: 13.2 g/dL (ref 13.0–18.0)
MCH: 30 pg (ref 26.0–34.0)
MCHC: 34.9 g/dL (ref 32.0–36.0)
MCV: 86.1 fL (ref 80.0–100.0)
Platelets: 150 10*3/uL (ref 150–440)
RBC: 4.41 MIL/uL (ref 4.40–5.90)
RDW: 13.9 % (ref 11.5–14.5)
WBC: 5.9 10*3/uL (ref 3.8–10.6)

## 2015-10-02 LAB — URINALYSIS COMPLETE WITH MICROSCOPIC (ARMC ONLY)
BILIRUBIN URINE: NEGATIVE
Bacteria, UA: NONE SEEN
GLUCOSE, UA: NEGATIVE mg/dL
HGB URINE DIPSTICK: NEGATIVE
Ketones, ur: NEGATIVE mg/dL
Leukocytes, UA: NEGATIVE
Nitrite: NEGATIVE
Protein, ur: NEGATIVE mg/dL
RBC / HPF: NONE SEEN RBC/hpf (ref 0–5)
SQUAMOUS EPITHELIAL / LPF: NONE SEEN
Specific Gravity, Urine: 1.01 (ref 1.005–1.030)
pH: 5 (ref 5.0–8.0)

## 2015-10-02 LAB — AMMONIA

## 2015-10-02 LAB — TROPONIN I

## 2015-10-02 LAB — ETHANOL: Alcohol, Ethyl (B): <5 mg/dL (ref <5)

## 2015-10-02 MED ORDER — ALBUTEROL SULFATE (2.5 MG/3ML) 0.083% IN NEBU: 3.0000 mL | INHALATION_SOLUTION | RESPIRATORY_TRACT | Status: DC | PRN

## 2015-10-02 MED ORDER — SODIUM CHLORIDE 0.9% FLUSH
3.0000 mL | Freq: Two times a day (BID) | INTRAVENOUS | Status: DC
  Administered 2015-10-02 – 2015-10-04 (×4): 3 mL via INTRAVENOUS

## 2015-10-02 MED ORDER — MORPHINE SULFATE 15 MG PO TABS
15.0000 mg | ORAL_TABLET | Freq: Three times a day (TID) | ORAL | Status: AC | PRN
  Administered 2015-10-02 – 2015-10-03 (×2): 15 mg via ORAL
  Filled 2015-10-02: qty 1

## 2015-10-02 MED ORDER — ACETAMINOPHEN 325 MG PO TABS: 650.0000 mg | ORAL_TABLET | Freq: Four times a day (QID) | ORAL | Status: DC | PRN

## 2015-10-02 MED ORDER — TAMSULOSIN HCL 0.4 MG PO CAPS
0.4000 mg | ORAL_CAPSULE | Freq: Every day | ORAL | Status: DC
  Administered 2015-10-02 – 2015-10-03 (×2): 0.4 mg via ORAL
  Filled 2015-10-02 (×2): qty 1

## 2015-10-02 MED ORDER — ONDANSETRON HCL 4 MG/2ML IJ SOLN: 4.0000 mg | Freq: Four times a day (QID) | INTRAMUSCULAR | Status: DC | PRN

## 2015-10-02 MED ORDER — RIVAROXABAN 20 MG PO TABS
20.0000 mg | ORAL_TABLET | Freq: Every day | ORAL | Status: DC
  Filled 2015-10-02: qty 1

## 2015-10-02 MED ORDER — DIAZEPAM 5 MG/ML IJ SOLN
5.0000 mg | Freq: Once | INTRAMUSCULAR | Status: AC
  Administered 2015-10-02: 5 mg via INTRAVENOUS
  Filled 2015-10-02: qty 2

## 2015-10-02 MED ORDER — PANTOPRAZOLE SODIUM 40 MG PO TBEC
40.0000 mg | DELAYED_RELEASE_TABLET | Freq: Two times a day (BID) | ORAL | Status: DC
  Administered 2015-10-02 – 2015-10-04 (×4): 40 mg via ORAL
  Filled 2015-10-02 (×4): qty 1

## 2015-10-02 MED ORDER — MORPHINE SULFATE 15 MG PO TABS
15.0000 mg | ORAL_TABLET | Freq: Three times a day (TID) | ORAL | Status: DC | PRN
  Administered 2015-10-03 – 2015-10-04 (×3): 15 mg via ORAL
  Filled 2015-10-02 (×4): qty 1

## 2015-10-02 MED ORDER — ACETAMINOPHEN 650 MG RE SUPP: 650.0000 mg | Freq: Four times a day (QID) | RECTAL | Status: DC | PRN

## 2015-10-02 MED ORDER — VERAPAMIL HCL ER 120 MG PO TBCR
120.0000 mg | EXTENDED_RELEASE_TABLET | Freq: Every day | ORAL | Status: DC
  Administered 2015-10-04: 120 mg via ORAL
  Filled 2015-10-02: qty 1

## 2015-10-02 MED ORDER — ONDANSETRON HCL 4 MG PO TABS: 4.0000 mg | ORAL_TABLET | Freq: Four times a day (QID) | ORAL | Status: DC | PRN

## 2015-10-02 MED ORDER — HALOPERIDOL LACTATE 5 MG/ML IJ SOLN
5.0000 mg | Freq: Once | INTRAMUSCULAR | Status: AC
  Administered 2015-10-02: 5 mg via INTRAMUSCULAR
  Filled 2015-10-02: qty 1

## 2015-10-02 MED ORDER — LORAZEPAM 2 MG/ML IJ SOLN
INTRAMUSCULAR | Status: AC
  Filled 2015-10-02: qty 1

## 2015-10-02 NOTE — ED Notes (Signed)
X-ray at bedside

## 2015-10-02 NOTE — ED Notes (Signed)
Assisted pt with urinal. Wife at bedside.

## 2015-10-02 NOTE — ED Notes (Addendum)
Pt to ed via ems from home for altered mental status.  According to family pt has been progressively getting worse each day.  Pt was seen in ed here yesterday for same. Pt nonverbal at this time.  Pt with constant movement unable to follow commands.

## 2015-10-02 NOTE — ED Notes (Signed)
Spoke with Dr. Mariea Clonts of allergy to [facility] and order for diazepam. MD states "ok" to give" due to listed allergy as dizziness.

## 2015-10-02 NOTE — Telephone Encounter (Signed)
  Oncology Nurse Navigator Documentation  Navigator Location: [facility] (10/02/15 1100)                 Barriers/Navigation Needs: Coordination of Care (10/02/15 1100)   Interventions: Coordination of Care (10/02/15 1100)   Coordination of Care: EUS (10/02/15 1100)        Acuity: Level 2 (10/02/15 1100)   Acuity Level 2: Initial guidance, education and coordination as needed;Educational needs (10/02/15 1100)     Time Spent with Patient: 15 (10/02/15 1100)   Received permission from Dr Wolfgang Phoenix for patient to hold Xarelto 48 hours prior to EUS. Last dose will be on 10/16/15. Dr Mont Dutton will instruct when to resume following EUS. These instructions were given to spouse with readback.

## 2015-10-02 NOTE — ED Provider Notes (Signed)
[facility] Emergency Department Provider Note  ____________________________________________  Time seen: Approximately 2:15 PM  I have reviewed the triage vital signs and the nursing notes.   HISTORY  Chief Complaint Altered Mental Status  The patient is unable to give any history due to his altered mental status.  HPI Alexander [name] is a 60 y.o. male with a history of COPD and recent episodes of altered mental status brought by EMS for altered mental status. Per report, the patient was getting ready to go to an outpatient appointment at [facility] when his wife found him putting his underwear on outside of his pants. On arrival, EMS noted that he was extremely agitated and aggressive, they were unable to care for him because he was swinging at them, so he received 2 mg of Versed. I do not have any additional information about the events today.  The patient has had a multi-month history of syncopal episodes and altered mental status which have been evaluated extensively by his primary care for physician, cardiology and neurology. It appears that in late December, the patient had a EEG that did not show any seizure activity. He has also had a normal CT scan and MRI with mild enhanced T2 flare, but no associated diagnosis.   Past Medical History  Diagnosis Date  . COPD (chronic obstructive pulmonary disease) ([place])   . Migraine   . Asthma   . BPH (benign prostatic hyperplasia)   . Deviated nasal septum   . DVT (deep venous thrombosis) ([place])   . Erosive esophagitis   . 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 Orthostatic hypotension 09/28/2015  . Vasovagal syncope 09/28/2015  . Deep vein thrombosis (DVT) of distal vein of lower extremity ([place]) 09/28/2015  . Hypokalemia 09/25/2015  . Gastroenteritis, acute 09/25/2015  . Gastroenteritis 09/25/2015  . Pressure ulcer 08/18/2015  . HCAP (healthcare-associated  pneumonia) 08/17/2015  . Syncope and collapse 08/10/2015  . Syncope 08/10/2015  . Protein-calorie malnutrition, severe 08/10/2015  . Abdominal pain 07/17/2015  . Nausea & vomiting 07/17/2015  . Malnutrition of moderate degree 07/17/2015  . Diarrhea 05/18/2015  . DEVIATED SEPTUM 05/18/2007  . ALLERGIC RHINITIS 05/18/2007  . ASTHMA 05/18/2007    Past Surgical History  Procedure Laterality Date  . Throat surgery    . Barryx N/A   . Esophagogastroduodenoscopy (egd) with propofol N/A 02/23/2015    Procedure: ESOPHAGOGASTRODUODENOSCOPY (EGD) WITH PROPOFOL;  Surgeon: Hulen Luster, MD;  Location: [facility] ENDOSCOPY;  Service: Gastroenterology;  Laterality: N/A;  . Esophagogastroduodenoscopy (egd) with propofol N/A 05/04/2015    Procedure: ESOPHAGOGASTRODUODENOSCOPY (EGD) WITH PROPOFOL;  Surgeon: Hulen Luster, MD;  Location: [facility] ENDOSCOPY;  Service: Gastroenterology;  Laterality: N/A;  . Tonsillectomy    . Esophagogastroduodenoscopy (egd) with propofol N/A 07/27/2015    Procedure: ESOPHAGOGASTRODUODENOSCOPY (EGD) WITH PROPOFOL;  Surgeon: Hulen Luster, MD;  Location: [facility] ENDOSCOPY;  Service: Gastroenterology;  Laterality: N/A;    Current Outpatient Rx  Name  Route  Sig  Dispense  Refill  . albuterol (PROAIR HFA) 108 (90 Base) MCG/ACT inhaler   Inhalation   Inhale 1-2 puffs into the lungs every 4 (four) hours as needed for wheezing or shortness of breath.          . cyanocobalamin 500 MCG tablet   Oral   Take 500 mcg by mouth daily.         Marland Kitchen docusate sodium (COLACE) 100 MG capsule  Oral   Take 200 mg by mouth 2 (two) times daily as needed for mild constipation.         Marland Kitchen loperamide (IMODIUM) 2 MG capsule   Oral   Take 2 mg by mouth as needed for diarrhea or loose stools.         . midodrine (PROAMATINE) 2.5 MG tablet   Oral   Take 2.5 mg by mouth 2 (two) times daily with a meal.         . morphine (MSIR) 30 MG tablet   Oral   Take 30 mg by mouth 3 (three) times daily.          . Omeprazole (RA OMEPRAZOLE) 20 MG TBEC   Oral   Take 20 mg by mouth 2 (two) times daily.         . potassium chloride (K-DUR) 10 MEQ tablet   Oral   Take 1 tablet (10 mEq total) by mouth 2 (two) times daily.   6 tablet   0   . PRESCRIPTION MEDICATION   Oral   Take 1 tablet by mouth at bedtime. Verapamil-unknown mg         . promethazine (PHENERGAN) 25 MG tablet   Oral   Take 1 tablet (25 mg total) by mouth every 6 (six) hours as needed for nausea or vomiting.   12 tablet   1   . rivaroxaban (XARELTO) 20 MG TABS tablet   Oral   Take 20 mg by mouth daily with supper.         . senna (SENOKOT) 8.6 MG TABS tablet   Oral   Take 2 tablets by mouth 2 (two) times daily as needed for mild constipation.         . SUMAtriptan (IMITREX) 50 MG tablet   Oral   Take 50 mg by mouth every 2 (two) hours as needed for migraine.          . tamsulosin (FLOMAX) 0.4 MG CAPS capsule   Oral   Take 0.4 mg by mouth at bedtime.         . traZODone (DESYREL) 50 MG tablet   Oral   Take 50 mg by mouth at bedtime.           Allergies Bee venom and Ativan  Family History  Problem Relation Age of Onset  . Mesothelioma Father   . Breast cancer Mother     Social History Social History  Substance Use Topics  . Smoking status: Former Research scientist (life sciences)  . Smokeless tobacco: Not on file  . Alcohol Use: No    Review of Systems Unable to obtain due to patient altered mental status.    ____________________________________________   PHYSICAL EXAM:  VITAL SIGNS: ED Triage Vitals  Enc Vitals Group     BP --      Pulse --      Resp --      Temp --      Temp src --      SpO2 --      Weight --      Height --      Head Cir --      Peak Flow --      Pain Score --      Pain Loc --      Pain Edu? --      Excl. in [place]? --     Constitutional: Patient is alert but completely nonverbal. He does not follow commands. He  is shaking diffusely throughout the bed, but has occasional  rhythmic movements of the right upper extremity.  Eyes: Conjunctivae are normal.  No eye discharge. PERRLA. Unable to assess extra ocular movements as patient is unable to follow commands. Head: Atraumatic. No raccoon eyes or Battle sign. Nose: No congestion/rhinnorhea. Mouth/Throat: Mucous membranes are dry.  Neck: No stridor.  Supple.   Cardiovascular: Fast rate, regular rhythm. No murmurs, rubs or gallops.  Respiratory: Normal respiratory effort.  No retractions. Lungs CTAB.  No wheezes, rales or ronchi. Gastrointestinal: Soft and nontender. No distention. No peritoneal signs. Musculoskeletal: No LE edema.  Neurologic:  Patient is alert and protecting his airway but is completely nonverbal except for occasional undecipherable speech. Face is symmetric. PERRLA. Moves all extremities. Has continuous agitation and shaking of the extremities, with occasional episodes of rhythmic shaking in the right upper extremity.  Skin:  Skin is warm, dry and intact. No rash noted. Psychiatric: Agitated.  ____________________________________________   LABS (all labs ordered are listed, but only abnormal results are displayed)  Labs Reviewed  CBC  COMPREHENSIVE METABOLIC PANEL  AMMONIA  TROPONIN I  URINALYSIS COMPLETEWITH MICROSCOPIC ([facility] ONLY)  URINE DRUG SCREEN, QUALITATIVE ([facility] ONLY)  ETHANOL  BLOOD GAS, VENOUS   ____________________________________________  EKG  EKG was still pending at the time that I signed out with Dr. Joni Fears.  ____________________________________________  RADIOLOGY  No results found.  ____________________________________________   PROCEDURES  Procedure(s) performed: None  Critical Care performed: Yes, see note ____________________________________________   INITIAL IMPRESSION / ASSESSMENT AND PLAN / ED COURSE  Pertinent labs & imaging results that were available during my care of the patient were reviewed by me and considered in my medical decision  making (see chart for details).  60 y.o. male presenting with acute worsening of altered mental status, associated with agitation, and diffuse body shaking with occasional rhythmic shaking of the right upper extremity. The patient's presentation today is completely different compared to how he was seen yesterday. There are multiple possible etiologies including acute seizure, acute stroke, occult withdrawal, other toxic ingestion.  He is afebrile and has no obvious signs or symptoms of infection including meningitis. I will get a urine and a chest x-ray. The patient will require admission.  In order to complete our assessment, I have ordered Haldol for the patient as he has a documented allergy to Ativan although this is only dizziness. He is maintaining his airway at this time, and I will continue to monitor him for this.  CRITICAL CARE Performed by: Eula Listen   Total critical care time: 30 minutes  Critical care time was exclusive of separately billable procedures and treating other patients.  Critical care was necessary to treat or prevent imminent or life-threatening deterioration.  Critical care was time spent personally by me on the following activities: development of treatment plan with patient and/or surrogate as well as nursing, discussions with consultants, evaluation of patient's response to treatment, examination of patient, obtaining history from patient or surrogate, ordering and performing treatments and interventions, ordering and review of laboratory studies, ordering and review of radiographic studies, pulse oximetry and re-evaluation of patient's condition.   ----------------------------------------- 3:23 PM on 10/02/2015 -----------------------------------------  Patient continues to be agitated despite 2 mg of Versed given by EMS and 5 mg of Haldol. I will also give him Valium. His VBG is reassuring, he does not have an elevated pneumonia, his urinalysis does  not show infection. He has multiple positive findings in his urine drugs screen, which  may be prescribed but I do not have his medication list today. He is mildly hypokalemic so we will supplement that with IV potassium. He has not yet undergone CT scan as he was too agitated, and I have sent the patient out with Dr. Joni Fears who after he saw him yesterday. He will follow up the remainder of the studies. Dr. Doy Mince, the neurologist on call will also see the patient. ____________________________________________  FINAL CLINICAL IMPRESSION(S) / ED DIAGNOSES  Final diagnoses:  None      NEW MEDICATIONS STARTED DURING THIS VISIT:  New Prescriptions   No medications on file     Eula Listen, MD 10/02/15 1525

## 2015-10-02 NOTE — ED Notes (Signed)
Pt given chocolate ice cream per request 

## 2015-10-02 NOTE — ED Provider Notes (Signed)
 -----------------------------------------   8:41 PM on 10/02/2015 -----------------------------------------  Assumed care of the patient at 3:00 PM with CT head pending and other labs.  Extensive workup is all unremarkable except for urine drug screen positive for TCA, PCP, opiate, benzodiazepine. I think the opiate is probably related to his morphine use at home, and the benzodiazepine is related to Valium he received on arrival to the ED. Over time the patient gradually has become more alert and less confused. I asked him about any drug use and he completely denies any illicit drug use at all. Former smoker, former alcohol use but none currently. He is feeling better and looking more like he did when I evaluated him yesterday in the emergency department, but still has somewhat dull mentation. Currently the plan is for hospitalization for observation overnight and neurology consultation tomorrow.  Him and his improving mental status, he does not appear to warrant a lumbar puncture. No empiric Antibiotics needed. Low suspicion for meningitis or encephalitis.  Case discussed with the hospitalist.  Final diagnoses:  Disorientation  Agitation     Carrie Mew, MD 10/02/15 2044

## 2015-10-02 NOTE — H&P (Signed)
[facility] NAME: Alexander [name]    MR#:  [id number]  DATE OF BIRTH:  [birth year]  DATE OF ADMISSION:  10/02/2015  PRIMARY CARE PHYSICIAN: Perrin Maltese, MD   REQUESTING/REFERRING PHYSICIAN: Joni Fears, MD  CHIEF COMPLAINT:   Chief Complaint  Patient presents with  . Altered Mental Status    HISTORY OF PRESENT ILLNESS:  Alexander [name]  is a 60 y.o. male who presents with waxing and waning encephalopathy. Patient has been here several times over the last 4-5 weeks, and has also been admitted at the hospital at [facility]. Family's report for the same problem. He's been having increasing falls, and significant bouts of confusion, including sometimes with some behavioral disturbance. On interview tonight he is alert and oriented to questioning, but has the interview progresses he answers some questions with information from the events that occurred in the distant past. Workup has been largely negative except for a urine drug screen today which was positive for benzodiazepines, which he received in the ED yesterday, opiates, which he has a prescription for, but also PCP and tricyclics. Family states that he does not go anywhere unless somebody takes him, and family and patient deny any illicit drug use. Patient also has a history of lymphoma, and has stopped receiving chemotherapy treatments. Hospitals were called for admission for further evaluation of this patient's progressive encephalopathy.  PAST MEDICAL HISTORY:   Past Medical History  Diagnosis Date  . COPD (chronic obstructive pulmonary disease) ([place])   . Migraine   . Asthma   . BPH (benign prostatic hyperplasia)   . Deviated nasal septum   . DVT (deep venous thrombosis) ([place])   . Erosive esophagitis   . 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 GERD (gastroesophageal reflux disease)     PAST SURGICAL HISTORY:   Past Surgical History  Procedure Laterality Date  . Throat surgery     . Barryx N/A   . Esophagogastroduodenoscopy (egd) with propofol N/A 02/23/2015    Procedure: ESOPHAGOGASTRODUODENOSCOPY (EGD) WITH PROPOFOL;  Surgeon: Hulen Luster, MD;  Location: [facility] ENDOSCOPY;  Service: Gastroenterology;  Laterality: N/A;  . Esophagogastroduodenoscopy (egd) with propofol N/A 05/04/2015    Procedure: ESOPHAGOGASTRODUODENOSCOPY (EGD) WITH PROPOFOL;  Surgeon: Hulen Luster, MD;  Location: [facility] ENDOSCOPY;  Service: Gastroenterology;  Laterality: N/A;  . Tonsillectomy    . Esophagogastroduodenoscopy (egd) with propofol N/A 07/27/2015    Procedure: ESOPHAGOGASTRODUODENOSCOPY (EGD) WITH PROPOFOL;  Surgeon: Hulen Luster, MD;  Location: [facility] ENDOSCOPY;  Service: Gastroenterology;  Laterality: N/A;    SOCIAL HISTORY:   Social History  Substance Use Topics  . Smoking status: Former Research scientist (life sciences)  . Smokeless tobacco: Not on file  . Alcohol Use: No    FAMILY HISTORY:   Family History  Problem Relation Age of Onset  . Mesothelioma Father   . Breast cancer Mother     DRUG ALLERGIES:   Allergies  Allergen Reactions  . Bee Venom Shortness Of Breath and Swelling  . Ativan [Lorazepam] Other (See Comments)    Reaction:  Dizziness     MEDICATIONS AT HOME:   Prior to Admission medications   Medication Sig Start Date End Date Taking? Authorizing Provider  albuterol (PROAIR HFA) 108 (90 Base) MCG/ACT inhaler Inhale 1-2 puffs into the lungs every 4 (four) hours as needed for wheezing or shortness of breath.    Yes Historical Provider, MD  cyanocobalamin 500 MCG tablet Take 500 mcg by mouth daily.  Yes Historical Provider, MD  dicyclomine (BENTYL) 20 MG tablet Take 20 mg by mouth 4 (four) times daily -  before meals and at bedtime.   Yes Historical Provider, MD  docusate sodium (COLACE) 100 MG capsule Take 200 mg by mouth 2 (two) times daily as needed for mild constipation.   Yes Historical Provider, MD  loperamide (IMODIUM) 2 MG capsule Take 2 mg by mouth as needed for diarrhea or loose stools.    Yes Historical Provider, MD  midodrine (PROAMATINE) 2.5 MG tablet Take 2.5 mg by mouth 2 (two) times daily with a meal.   Yes Historical Provider, MD  morphine (MSIR) 30 MG tablet Take 30 mg by mouth 3 (three) times daily.   Yes Historical Provider, MD  omeprazole (PRILOSEC) 20 MG capsule Take 20 mg by mouth 2 (two) times daily.   Yes Historical Provider, MD  potassium chloride (K-DUR) 10 MEQ tablet Take 1 tablet (10 mEq total) by mouth 2 (two) times daily. 09/30/15  Yes Fredia Sorrow, MD  promethazine (PHENERGAN) 25 MG tablet Take 1 tablet (25 mg total) by mouth every 6 (six) hours as needed for nausea or vomiting. 09/30/15  Yes Fredia Sorrow, MD  rivaroxaban (XARELTO) 20 MG TABS tablet Take 20 mg by mouth daily with supper.   Yes Historical Provider, MD  senna (SENOKOT) 8.6 MG TABS tablet Take 2 tablets by mouth 2 (two) times daily as needed for mild constipation.   Yes Historical Provider, MD  SUMAtriptan (IMITREX) 50 MG tablet Take 50 mg by mouth every 2 (two) hours as needed for migraine.    Yes Historical Provider, MD  tamsulosin (FLOMAX) 0.4 MG CAPS capsule Take 0.4 mg by mouth at bedtime.   Yes Historical Provider, MD  traZODone (DESYREL) 50 MG tablet Take 50 mg by mouth at bedtime.   Yes Historical Provider, MD  verapamil (CALAN-SR) 120 MG CR tablet Take 120 mg by mouth daily.   Yes Historical Provider, MD    REVIEW OF SYSTEMS:  Review of Systems  Constitutional: Positive for malaise/fatigue. Negative for fever, chills and weight loss.  HENT: Negative for ear pain, hearing loss and tinnitus.   Eyes: Negative for blurred vision, double vision, pain and redness.  Respiratory: Negative for cough, hemoptysis and shortness of breath.   Cardiovascular: Negative for chest pain, palpitations, orthopnea and leg swelling.  Gastrointestinal: Negative for nausea, vomiting, abdominal pain, diarrhea and constipation.  Genitourinary: Negative for dysuria, frequency and hematuria.   Musculoskeletal: Positive for falls. Negative for back pain, joint pain and neck pain.  Skin:       No acne, rash, or lesions  Neurological: Positive for weakness. Negative for dizziness, tremors and focal weakness.       Confusion  Endo/Heme/Allergies: Negative for polydipsia. Does not bruise/bleed easily.  Psychiatric/Behavioral: Negative for depression. The patient is not nervous/anxious and does not have insomnia.      VITAL SIGNS:   Filed Vitals:   10/02/15 1419 10/02/15 1706 10/02/15 1830 10/02/15 1915  BP: 130/101 108/81 131/68 134/95  Pulse: 110 101 101 94  Temp: 98 F (36.7 C)  98.3 F (36.8 C)   TempSrc: Axillary  Oral   Resp: 22 22 18 20   Weight: 75.297 kg (166 lb)     SpO2: 100% 100% 99% 97%   Wt Readings from Last 3 Encounters:  10/02/15 75.297 kg (166 lb)  10/01/15 75.297 kg (166 lb)  09/28/15 75.297 kg (166 lb)    PHYSICAL EXAMINATION:  Physical Exam  Vitals reviewed. Constitutional: He is oriented to person, place, and time. He appears well-developed and well-nourished. No distress.  HENT:  Head: Normocephalic and atraumatic.  Mouth/Throat: Oropharynx is clear and moist.  Eyes: Conjunctivae and EOM are normal. Pupils are equal, round, and reactive to light. No scleral icterus.  Neck: Normal range of motion. Neck supple. No JVD present. No thyromegaly present.  Cardiovascular: Normal rate, regular rhythm and intact distal pulses.  Exam reveals no gallop and no friction rub.   No murmur heard. Respiratory: Effort normal and breath sounds normal. No respiratory distress. He has no wheezes. He has no rales.  GI: Soft. Bowel sounds are normal. He exhibits no distension. There is no tenderness.  Musculoskeletal: Normal range of motion. He exhibits tenderness (right knee). He exhibits no edema.  No arthritis, no gout  Lymphadenopathy:    He has no cervical adenopathy.  Neurological: He is alert and oriented to person, place, and time. No cranial nerve deficit.   Oriented to questioning, but waxing and waning confusion as per history of present illness. No dysarthria, no aphasia  Skin: Skin is warm and dry. No rash noted. No erythema.  Psychiatric: He has a normal mood and affect. His behavior is normal. Judgment and thought content normal.    LABORATORY PANEL:   CBC  Recent Labs Lab 10/02/15 1408  WBC 5.9  HGB 13.2  HCT 37.9*  PLT 150   ------------------------------------------------------------------------------------------------------------------  Chemistries   Recent Labs Lab 10/02/15 1408  NA 142  K 3.1*  CL 107  CO2 28  GLUCOSE 124*  BUN 7  CREATININE 0.71  CALCIUM 8.7*  AST 16  ALT 12*  ALKPHOS 100  BILITOT 1.1   ------------------------------------------------------------------------------------------------------------------  Cardiac Enzymes  Recent Labs Lab 10/02/15 1408  TROPONINI <0.03   ------------------------------------------------------------------------------------------------------------------  RADIOLOGY:  Ct Head Wo Contrast  10/02/2015  CLINICAL DATA:  60 year old male with altered mental status. Seen for the same yesterday. EXAM: CT HEAD WITHOUT CONTRAST TECHNIQUE: Contiguous axial images were obtained from the base of the skull through the vertex without intravenous contrast. COMPARISON:  Recent prior head CT 10/01/2015 FINDINGS: Negative for acute intracranial hemorrhage, acute infarction, mass, mass effect, hydrocephalus or midline shift. Gray-white differentiation is preserved throughout. No acute soft tissue or calvarial abnormality. The globes and orbits are symmetric and unremarkable. Normal aeration of the mastoid air cells and visualized paranasal sinuses. Perhaps minimal chronic microvascular ischemic white matter change. IMPRESSION: Negative head CT. Electronically Signed   By: Jacqulynn Cadet M.D.   On: 10/02/2015 20:33   Ct Head Wo Contrast  10/01/2015  CLINICAL DATA:  Unwitnessed  fall, altered mental status EXAM: CT HEAD WITHOUT CONTRAST CT CERVICAL SPINE WITHOUT CONTRAST TECHNIQUE: Multidetector CT imaging of the head and cervical spine was performed following the standard protocol without intravenous contrast. Multiplanar CT image reconstructions of the cervical spine were also generated. COMPARISON:  Head CT dated 09/30/2015 FINDINGS: CT HEAD FINDINGS No evidence of parenchymal hemorrhage or extra-axial fluid collection. No mass lesion, mass effect, or midline shift. No CT evidence of acute infarction. Subcortical white matter and periventricular small vessel ischemic changes. Intracranial atherosclerosis. Mild cortical atrophy.  No ventriculomegaly. Partial opacification of the right ethmoid air cells. Mastoid air cells are clear. No evidence of calvarial fracture. CT CERVICAL SPINE FINDINGS Mild exaggerated lower cervical lordosis. No evidence of fracture or dislocation. Vertebral body heights are maintained. Dens appears intact. No prevertebral soft tissue swelling. Moderate multilevel degenerative changes. Visualized thyroid is unremarkable. Visualized lung apices  are clear. IMPRESSION: No evidence of acute intracranial abnormality. Mild small vessel ischemic changes. No evidence of traumatic injury to the cervical spine. Moderate multilevel degenerative changes. Electronically Signed   By: Julian Hy M.D.   On: 10/01/2015 12:55   Ct Cervical Spine Wo Contrast  10/01/2015  CLINICAL DATA:  Unwitnessed fall, altered mental status EXAM: CT HEAD WITHOUT CONTRAST CT CERVICAL SPINE WITHOUT CONTRAST TECHNIQUE: Multidetector CT imaging of the head and cervical spine was performed following the standard protocol without intravenous contrast. Multiplanar CT image reconstructions of the cervical spine were also generated. COMPARISON:  Head CT dated 09/30/2015 FINDINGS: CT HEAD FINDINGS No evidence of parenchymal hemorrhage or extra-axial fluid collection. No mass lesion, mass effect,  or midline shift. No CT evidence of acute infarction. Subcortical white matter and periventricular small vessel ischemic changes. Intracranial atherosclerosis. Mild cortical atrophy.  No ventriculomegaly. Partial opacification of the right ethmoid air cells. Mastoid air cells are clear. No evidence of calvarial fracture. CT CERVICAL SPINE FINDINGS Mild exaggerated lower cervical lordosis. No evidence of fracture or dislocation. Vertebral body heights are maintained. Dens appears intact. No prevertebral soft tissue swelling. Moderate multilevel degenerative changes. Visualized thyroid is unremarkable. Visualized lung apices are clear. IMPRESSION: No evidence of acute intracranial abnormality. Mild small vessel ischemic changes. No evidence of traumatic injury to the cervical spine. Moderate multilevel degenerative changes. Electronically Signed   By: Julian Hy M.D.   On: 10/01/2015 12:55   Dg Chest Portable 1 View  10/02/2015  CLINICAL DATA:  Altered mental status.  COPD. EXAM: PORTABLE CHEST 1 VIEW COMPARISON:  09/24/2015 FINDINGS: The heart size and mediastinal contours are within normal limits. Both lungs are clear. The visualized skeletal structures are unremarkable. IMPRESSION: Normal chest. Electronically Signed   By: Lorriane Shire M.D.   On: 10/02/2015 14:42    EKG:   Orders placed or performed during the hospital encounter of 10/02/15  . ED EKG  . ED EKG  . ED EKG  . ED EKG    IMPRESSION AND PLAN:  Principal Problem:   Acute encephalopathy - unclear etiology with negative workup including negative ammonia, negative head CT, her labs large within normal limits except for his urine drug screen as per history of present illness. With his history of lymphoma, there is some concern of metastases, possibly even brain metastases. We will get a workup with neurology consult as the next step. Active Problems:   COPD (chronic obstructive pulmonary disease) (HCC) - continue home meds    History of DVT (deep vein thrombosis) - home dose anticoagulation   GERD (gastroesophageal reflux disease) - home dose PPI   BPH (benign prostatic hyperplasia) - continue home meds  All the records are reviewed and case discussed with ED provider. Management plans discussed with the patient and/or family.  DVT PROPHYLAXIS: Systemic anticoagulation  GI PROPHYLAXIS: PPI  ADMISSION STATUS: Observation  CODE STATUS: Full Code Status History    Date Active Date Inactive Code Status Order ID Comments User Context   09/25/2015  1:32 AM 09/26/2015  2:00 PM Full Code [id number]  Saundra Shelling, MD Inpatient   08/17/2015 10:03 PM 08/19/2015  2:23 PM Full Code [id number]  Gladstone Lighter, MD Inpatient   08/10/2015  8:28 AM 08/12/2015  1:40 PM Full Code [id number]  Saundra Shelling, MD Inpatient   07/17/2015  1:08 AM 07/19/2015  2:13 PM Full Code [id number]  Saundra Shelling, MD Inpatient   05/18/2015  1:29 PM 05/19/2015  3:02 PM Full Code [id number]  Dustin Flock, MD Inpatient      TOTAL TIME TAKING CARE OF THIS PATIENT: 45 minutes.    Flara Storti [place] 10/02/2015, 10:12 PM  [facility]  Office  [phone]  CC: Primary care physician; Perrin Maltese, MD

## 2015-10-03 ENCOUNTER — Observation Stay: Payer: Medicaid Other

## 2015-10-03 ENCOUNTER — Telehealth: Payer: Self-pay | Admitting: Neurology

## 2015-10-03 DIAGNOSIS — G9340 Encephalopathy, unspecified: Secondary | ICD-10-CM | POA: Diagnosis not present

## 2015-10-03 LAB — CBC
HCT: 41.1 % (ref 40.0–52.0)
Hemoglobin: 14 g/dL (ref 13.0–18.0)
MCH: 30.2 pg (ref 26.0–34.0)
MCHC: 34.1 g/dL (ref 32.0–36.0)
MCV: 88.6 fL (ref 80.0–100.0)
PLATELETS: 166 10*3/uL (ref 150–440)
RBC: 4.64 MIL/uL (ref 4.40–5.90)
RDW: 14.1 % (ref 11.5–14.5)
WBC: 9.6 10*3/uL (ref 3.8–10.6)

## 2015-10-03 LAB — BASIC METABOLIC PANEL
Anion gap: 7 (ref 5–15)
BUN: 7 mg/dL (ref 6–20)
CALCIUM: 8 mg/dL — AB (ref 8.9–10.3)
CO2: 29 mmol/L (ref 22–32)
CREATININE: 0.86 mg/dL (ref 0.61–1.24)
Chloride: 99 mmol/L — ABNORMAL LOW (ref 101–111)
GFR calc non Af Amer: >60 mL/min (ref >60)
Glucose, Bld: 138 mg/dL — ABNORMAL HIGH (ref 65–99)
Potassium: 3 mmol/L — ABNORMAL LOW (ref 3.5–5.1)
Sodium: 135 mmol/L (ref 135–145)

## 2015-10-03 LAB — MAGNESIUM: MAGNESIUM: 2.1 mg/dL (ref 1.7–2.4)

## 2015-10-03 LAB — PHOSPHORUS: PHOSPHORUS: 3.9 mg/dL (ref 2.5–4.6)

## 2015-10-03 LAB — TSH: TSH: 1.621 u[IU]/mL (ref 0.350–4.500)

## 2015-10-03 MED ORDER — POTASSIUM CHLORIDE 20 MEQ PO PACK
40.0000 meq | PACK | Freq: Two times a day (BID) | ORAL | Status: DC
  Administered 2015-10-03 – 2015-10-04 (×3): 40 meq via ORAL
  Filled 2015-10-03 (×3): qty 2

## 2015-10-03 MED ORDER — FLUTICASONE PROPIONATE 50 MCG/ACT NA SUSP
2.0000 | Freq: Every day | NASAL | Status: DC
  Administered 2015-10-03 – 2015-10-04 (×2): 2 via NASAL
  Filled 2015-10-03: qty 16

## 2015-10-03 NOTE — Progress Notes (Signed)
Pt and wife confirmed Rx for Verapamil 120mg . Pt states he takes this for headaches, and would like noted that he has previously tried other meds for unintended use including Haldol for appetite stimulation.

## 2015-10-03 NOTE — Progress Notes (Signed)
Pt declined to take xarelto this evening, stating that he has upcoming endoscopy appointment on Thursday and was advised not to take med.

## 2015-10-03 NOTE — Telephone Encounter (Signed)
Called again to schedule EEG that Dr. Erlinda Hong ordered and the pts wife advised that Alexander [name] was admitted to the hospital on 10/02/15 for increased confusion.  She advised that an EEG was ordered by the physician at the hospital.

## 2015-10-03 NOTE — Progress Notes (Signed)
EEG completed, results pending. 

## 2015-10-03 NOTE — Care Management (Addendum)
Patient (if admitted) will be a readmission. He is a [employer] patient and followed by [facility]. I have notified Santiago Glad with [facility] [facility] and will talk to patient about transfer to [place] option if they have a bed. I met with patient who states he has an appointment in the morning with Dr. Candace Cruise GI. Patient agrees with transfer to [facility] however he does not want to miss this appointment and he appears medically stable. MD has not rounded. I have left message for Marg[facility] Grizzle [facility] on [facility] status- OBSERVATION. Patient is from home with his wife.

## 2015-10-03 NOTE — Care Management (Signed)
Notified by Santiago Glad with [facility] that they are no longer following patient for home health. Closed 09/20/15 per Santiago Glad.

## 2015-10-03 NOTE — Progress Notes (Signed)
Pt obtains Rx's from both [place] and [facility].

## 2015-10-03 NOTE — Progress Notes (Signed)
[facility] NAME: Alexander [name]    MR#:  [id number]  DATE OF BIRTH:  11/02/1955  SUBJECTIVE:  CHIEF COMPLAINT:   Chief Complaint  Patient presents with  . Altered Mental Status     Came with episodes of confusions, have drug screen positive of urine, completely alert today. As per wife- he have up to 5 episodes daily.  REVIEW OF SYSTEMS:  CONSTITUTIONAL: No fever, fatigue or weakness.  EYES: No blurred or double vision.  EARS, NOSE, AND THROAT: No tinnitus or ear pain.  RESPIRATORY: No cough, shortness of breath, wheezing or hemoptysis.  CARDIOVASCULAR: No chest pain, orthopnea, edema.  GASTROINTESTINAL: No nausea, vomiting, diarrhea or abdominal pain.  GENITOURINARY: No dysuria, hematuria.  ENDOCRINE: No polyuria, nocturia,  HEMATOLOGY: No anemia, easy bruising or bleeding SKIN: No rash or lesion. MUSCULOSKELETAL: No joint pain or arthritis.   NEUROLOGIC: No tingling, numbness, weakness.  PSYCHIATRY: No anxiety or depression.   ROS  DRUG ALLERGIES:   Allergies  Allergen Reactions  . Bee Venom Shortness Of Breath and Swelling  . Ativan [Lorazepam] Other (See Comments)    Reaction:  Dizziness     VITALS:  Blood pressure 125/65, pulse 87, temperature 98.4 F (36.9 C), temperature source Oral, resp. rate 18, height 5\' 10"  (1.778 m), weight 75.751 kg (167 lb), SpO2 98 %.  PHYSICAL EXAMINATION:   Constitutional: He is oriented to person, place, and time. He appears well-developed and well-nourished. No distress.  HENT:  Head: Normocephalic and atraumatic.  Mouth/Throat: Oropharynx is clear and moist.  Eyes: Conjunctivae and EOM are normal. Pupils are equal, round, and reactive to light. No scleral icterus.  Neck: Normal range of motion. Neck supple. No JVD present. No thyromegaly present.  Cardiovascular: Normal rate, regular rhythm and intact distal pulses. Exam reveals no gallop and no friction rub.  No  murmur heard. Respiratory: Effort normal and breath sounds normal. No respiratory distress. He has no wheezes. He has no rales.  GI: Soft. Bowel sounds are normal. He exhibits no distension. There is no tenderness.  Musculoskeletal: Normal range of motion. He exhibits tenderness (right knee). He exhibits no edema.  No arthritis, no gout  Lymphadenopathy:   He has no cervical adenopathy.  Neurological: He is alert and oriented to person, place, and time. No cranial nerve deficit.  Oriented to questioning, but waxing and waning confusion as per history of present illness. No dysarthria, no aphasia  Skin: Skin is warm and dry. No rash noted. No erythema.  Psychiatric: He has a normal mood and affect. His behavior is normal. Judgment and thought content normal.     Physical Exam LABORATORY PANEL:   CBC  Recent Labs Lab 10/03/15 0632  WBC 9.6  HGB 14.0  HCT 41.1  PLT 166   ------------------------------------------------------------------------------------------------------------------  Chemistries   Recent Labs Lab 10/02/15 1408 10/03/15 0632  NA 142 135  K 3.1* 3.0*  CL 107 99*  CO2 28 29  GLUCOSE 124* 138*  BUN 7 7  CREATININE 0.71 0.86  CALCIUM 8.7* 8.0*  MG 2.1  --   AST 16  --   ALT 12*  --   ALKPHOS 100  --   BILITOT 1.1  --    ------------------------------------------------------------------------------------------------------------------  Cardiac Enzymes  Recent Labs Lab 10/02/15 1408  TROPONINI <0.03   ------------------------------------------------------------------------------------------------------------------  RADIOLOGY:  Dg Knee 1-2 Views Right  10/02/2015  CLINICAL DATA:  60 year old male with fall and right knee pain.  EXAM: RIGHT KNEE - 1-2 VIEW COMPARISON:  None. FINDINGS: There is no acute fracture or dislocation. There is mild osteopenia. There is mild narrowing of the medial compartment compatible with osteoarthritic changes. No  joint effusion. The soft tissues appear unremarkable with IMPRESSION: No acute osseous pathology. Electronically Signed   By: Anner Crete M.D.   On: 10/02/2015 23:12   Ct Head Wo Contrast  10/02/2015  CLINICAL DATA:  60 year old male with altered mental status. Seen for the same yesterday. EXAM: CT HEAD WITHOUT CONTRAST TECHNIQUE: Contiguous axial images were obtained from the base of the skull through the vertex without intravenous contrast. COMPARISON:  Recent prior head CT 10/01/2015 FINDINGS: Negative for acute intracranial hemorrhage, acute infarction, mass, mass effect, hydrocephalus or midline shift. Gray-white differentiation is preserved throughout. No acute soft tissue or calvarial abnormality. The globes and orbits are symmetric and unremarkable. Normal aeration of the mastoid air cells and visualized paranasal sinuses. Perhaps minimal chronic microvascular ischemic white matter change. IMPRESSION: Negative head CT. Electronically Signed   By: Jacqulynn Cadet M.D.   On: 10/02/2015 20:33   Mr Jodene Nam Head Wo Contrast  10/03/2015  CLINICAL DATA:  Waxing and waning encephalopathy. EXAM: MRI HEAD WITHOUT CONTRAST MRA HEAD WITHOUT CONTRAST TECHNIQUE: Multiplanar, multiecho pulse sequences of the brain and surrounding structures were obtained without intravenous contrast. Angiographic images of the head were obtained using MRA technique without contrast. COMPARISON:  08/10/2015 FINDINGS: MRI HEAD FINDINGS Calvarium and upper cervical spine: No focal marrow signal abnormality. Orbits: Negative. Sinuses and Mastoids: There is bilateral effacement of the bilateral nasal cavity, which contained roughly polypoid structures previously. Previous endoscopic sinus surgery with medial maxillary antrostomies and inferior turbinectomies. There has likely also been ethmoidectomies. Anterior nasal septal perforation, chronic. Brain: No acute infarct, hemorrhage, hydrocephalus, or mass lesion. No evidence of large  vessel occlusion. No age advanced or asymmetric atrophy. No abnormal cortical or thalamic diffusion abnormality. Stable degree and pattern of patchy hyperintensities the bilateral cerebral white matter, usually from chronic small vessel disease. Demyelinating disease or vasculopathy could give this appearance, but there are no specific features. No chronic hemorrhagic foci. MRA HEAD FINDINGS Left dominant vertebral artery. Symmetric carotid arteries. Present anterior communicating artery. No visible posterior communicating arteries. There is motion artifact at the slab interface which obscures M2, A2, and distal PCA vessels. When accounting for this artifact, there is no evidence of flow limiting stenosis or intracranial vasculopathy. No major branch occlusion. Negative for aneurysm or signs of vascular malformation. Note that on 3D reformats nonvisualized distal right V4 segment is related to small vessel size and late branching PICA on source images. IMPRESSION: 1. No acute finding or change from 2016. No specific explanation for neurologic symptoms. 2. When allowing for motion artifact, negative intracranial MRA. 3. Effaced nasal cavity, likely combination of rhinitis and nasal polyps. Patient is status post endoscopic sinus surgery. Correlate with nasal endoscopy. Electronically Signed   By: Monte Fantasia M.D.   On: 10/03/2015 14:03   Mr Brain Wo Contrast  10/03/2015  CLINICAL DATA:  Waxing and waning encephalopathy. EXAM: MRI HEAD WITHOUT CONTRAST MRA HEAD WITHOUT CONTRAST TECHNIQUE: Multiplanar, multiecho pulse sequences of the brain and surrounding structures were obtained without intravenous contrast. Angiographic images of the head were obtained using MRA technique without contrast. COMPARISON:  08/10/2015 FINDINGS: MRI HEAD FINDINGS Calvarium and upper cervical spine: No focal marrow signal abnormality. Orbits: Negative. Sinuses and Mastoids: There is bilateral effacement of the bilateral nasal  cavity, which contained roughly polypoid  structures previously. Previous endoscopic sinus surgery with medial maxillary antrostomies and inferior turbinectomies. There has likely also been ethmoidectomies. Anterior nasal septal perforation, chronic. Brain: No acute infarct, hemorrhage, hydrocephalus, or mass lesion. No evidence of large vessel occlusion. No age advanced or asymmetric atrophy. No abnormal cortical or thalamic diffusion abnormality. Stable degree and pattern of patchy hyperintensities the bilateral cerebral white matter, usually from chronic small vessel disease. Demyelinating disease or vasculopathy could give this appearance, but there are no specific features. No chronic hemorrhagic foci. MRA HEAD FINDINGS Left dominant vertebral artery. Symmetric carotid arteries. Present anterior communicating artery. No visible posterior communicating arteries. There is motion artifact at the slab interface which obscures M2, A2, and distal PCA vessels. When accounting for this artifact, there is no evidence of flow limiting stenosis or intracranial vasculopathy. No major branch occlusion. Negative for aneurysm or signs of vascular malformation. Note that on 3D reformats nonvisualized distal right V4 segment is related to small vessel size and late branching PICA on source images. IMPRESSION: 1. No acute finding or change from 2016. No specific explanation for neurologic symptoms. 2. When allowing for motion artifact, negative intracranial MRA. 3. Effaced nasal cavity, likely combination of rhinitis and nasal polyps. Patient is status post endoscopic sinus surgery. Correlate with nasal endoscopy. Electronically Signed   By: Monte Fantasia M.D.   On: 10/03/2015 14:03   Dg Chest Portable 1 View  10/02/2015  CLINICAL DATA:  Altered mental status.  COPD. EXAM: PORTABLE CHEST 1 VIEW COMPARISON:  09/24/2015 FINDINGS: The heart size and mediastinal contours are within normal limits. Both lungs are clear. The  visualized skeletal structures are unremarkable. IMPRESSION: Normal chest. Electronically Signed   By: Lorriane Shire M.D.   On: 10/02/2015 14:42    ASSESSMENT AND PLAN:   Principal Problem:   Acute encephalopathy Active Problems:   COPD (chronic obstructive pulmonary disease) (HCC)   GERD (gastroesophageal reflux disease)   BPH (benign prostatic hyperplasia)   History of DVT (deep vein thrombosis)  * Acute encephalopathy - unclear etiology with negative workup including negative ammonia, negative head CT, her labs large within normal limits except for his urine drug screen as per history of present illness. With his history of lymphoma, there is some concern of metastases, possibly even brain metastases.  Neurology ordered EEG and MRI brain to look for causes for seizures.  * COPD (chronic obstructive pulmonary disease) (HCC) - continue home meds * History of DVT (deep vein thrombosis) - home dose anticoagulation * GERD (gastroesophageal reflux disease) - home dose PPI * BPH (benign prostatic hyperplasia) - continue home meds   All the records are reviewed and case discussed with Care Management/Social Workerr. Management plans discussed with the patient, family and they are in agreement.  CODE STATUS: full  TOTAL TIME TAKING CARE OF THIS PATIENT: 35 minutes.     POSSIBLE D/C IN 1-2 DAYS, DEPENDING ON CLINICAL CONDITION.   Vaughan Basta M.D on 10/03/2015   Between 7am to 6pm - Pager - [phone]  After 6pm go to [email or web address] - password [email or web address] [facility]  Office  [phone]  CC: Primary care physician; Perrin Maltese, MD  Note: This dictation was prepared with Dragon dictation along with smaller phrase technology. Any transcriptional errors that result from this process are unintentional.

## 2015-10-03 NOTE — Consult Note (Signed)
Reason for Consult:Episodes of confusion Referring Physician: Anselm Jungling  CC: Episodes of confusion  HPI: Alexander [name] is an 60 y.o. male with a history of syncope related to [facility] who for the past 2 weeks has had episodes of confusion.  The patient reports that he is amnestic of the events.  Wife reports episodes of starring into space as well as episodes when he becomes confused and at times combative.  The starring spells last a few seconds to minutes.  The confusion episodes may last a few hours.  Reports that on yesterday was getting ready for an appointment and noted that the patient was confused.  Was putting underwear on over his pants.  Talking was difficult to understand.  Became combative.  At times was shaking and "eyes back in his head".  Patient was brought to the ED where he became gradually more alert and less confused.   Wife reports episodes have been at a frequency of at least 5 per day but often more.  Sleep has been restless.    Past Medical History  Diagnosis Date  . COPD (chronic obstructive pulmonary disease) ([place])   . Migraine   . Asthma   . BPH (benign prostatic hyperplasia)   . Deviated nasal septum   . DVT (deep venous thrombosis) ([place])   . Erosive esophagitis   . Mantle cell lymphoma ([place])   . GERD (gastroesophageal reflux disease)     Past Surgical History  Procedure Laterality Date  . Throat surgery    . Barryx N/A   . Esophagogastroduodenoscopy (egd) with propofol N/A 02/23/2015    Procedure: ESOPHAGOGASTRODUODENOSCOPY (EGD) WITH PROPOFOL;  Surgeon: Hulen Luster, MD;  Location: [facility] ENDOSCOPY;  Service: Gastroenterology;  Laterality: N/A;  . Esophagogastroduodenoscopy (egd) with propofol N/A 05/04/2015    Procedure: ESOPHAGOGASTRODUODENOSCOPY (EGD) WITH PROPOFOL;  Surgeon: Hulen Luster, MD;  Location: [facility] ENDOSCOPY;  Service: Gastroenterology;  Laterality: N/A;  . Tonsillectomy    . Esophagogastroduodenoscopy (egd) with propofol N/A 07/27/2015    Procedure:  ESOPHAGOGASTRODUODENOSCOPY (EGD) WITH PROPOFOL;  Surgeon: Hulen Luster, MD;  Location: [facility] ENDOSCOPY;  Service: Gastroenterology;  Laterality: N/A;    Family History  Problem Relation Age of Onset  . Mesothelioma Father   . Breast cancer Mother     Social History:  reports that he has quit smoking. He does not have any smokeless tobacco history on file. He reports that he does not drink alcohol or use illicit drugs.  Allergies  Allergen Reactions  . Bee Venom Shortness Of Breath and Swelling  . Ativan [Lorazepam] Other (See Comments)    Reaction:  Dizziness     Medications:  I have reviewed the patient's current medications. Prior to Admission:  Prescriptions prior to admission  Medication Sig Dispense Refill Last Dose  . albuterol (PROAIR HFA) 108 (90 Base) MCG/ACT inhaler Inhale 1-2 puffs into the lungs every 4 (four) hours as needed for wheezing or shortness of breath.    PRN at PRN  . cyanocobalamin 500 MCG tablet Take 500 mcg by mouth daily.   unknown at unknown   . dicyclomine (BENTYL) 20 MG tablet Take 20 mg by mouth 4 (four) times daily -  before meals and at bedtime.   unknown at unknown   . docusate sodium (COLACE) 100 MG capsule Take 200 mg by mouth 2 (two) times daily as needed for mild constipation.   PRN at PRN  . loperamide (IMODIUM) 2 MG capsule Take 2 mg by mouth as needed  for diarrhea or loose stools.   PRN at PRN  . midodrine (PROAMATINE) 2.5 MG tablet Take 2.5 mg by mouth 2 (two) times daily with a meal.   unknown at unknown   . morphine (MSIR) 30 MG tablet Take 30 mg by mouth 3 (three) times daily.   unknown at unknown   . omeprazole (PRILOSEC) 20 MG capsule Take 20 mg by mouth 2 (two) times daily.   unknown at unknown   . potassium chloride (K-DUR) 10 MEQ tablet Take 1 tablet (10 mEq total) by mouth 2 (two) times daily. 6 tablet 0 unknown at unknown   . promethazine (PHENERGAN) 25 MG tablet Take 1 tablet (25 mg total) by mouth every 6 (six) hours as needed for  nausea or vomiting. 12 tablet 1 PRN at PRN  . rivaroxaban (XARELTO) 20 MG TABS tablet Take 20 mg by mouth daily with supper.   unknown at unknown  . senna (SENOKOT) 8.6 MG TABS tablet Take 2 tablets by mouth 2 (two) times daily as needed for mild constipation.   PRN at PRN  . SUMAtriptan (IMITREX) 50 MG tablet Take 50 mg by mouth every 2 (two) hours as needed for migraine.    PRN at PRN  . tamsulosin (FLOMAX) 0.4 MG CAPS capsule Take 0.4 mg by mouth at bedtime.   unknown at unknown   . traZODone (DESYREL) 50 MG tablet Take 50 mg by mouth at bedtime.   unknown at unknown   . verapamil (CALAN-SR) 120 MG CR tablet Take 120 mg by mouth daily.   unknown at unknown    Scheduled: . pantoprazole  40 mg Oral BID  . potassium chloride  40 mEq Oral BID  . rivaroxaban  20 mg Oral Q supper  . sodium chloride flush  3 mL Intravenous Q12H  . tamsulosin  0.4 mg Oral QHS  . verapamil  120 mg Oral Daily    ROS: History obtained from the patient  General ROS: negative for - chills, fatigue, fever, night sweats, weight gain or weight loss Psychological ROS: negative for - behavioral disorder, hallucinations, memory difficulties, mood swings or suicidal ideation Ophthalmic ROS: negative for - blurry vision, double vision, eye pain or loss of vision ENT ROS: negative for - epistaxis, nasal discharge, oral lesions, sore throat, tinnitus or vertigo Allergy and Immunology ROS: negative for - hives or itchy/watery eyes Hematological and Lymphatic ROS: negative for - bleeding problems, bruising or swollen lymph nodes Endocrine ROS: negative for - galactorrhea, hair pattern changes, polydipsia/polyuria or temperature intolerance Respiratory ROS: negative for - cough, hemoptysis, shortness of breath or wheezing Cardiovascular ROS: negative for - chest pain, dyspnea on exertion, edema or irregular heartbeat Gastrointestinal ROS: negative for - abdominal pain, diarrhea, hematemesis, nausea/vomiting or stool  incontinence Genito-Urinary ROS: negative for - dysuria, hematuria, incontinence or urinary frequency/urgency Musculoskeletal ROS: negative for - joint swelling or muscular weakness Neurological ROS: as noted in HPI Dermatological ROS: negative for rash and skin lesion changes  Physical Examination: Blood pressure 123/78, pulse 96, temperature 98.2 F (36.8 C), temperature source Oral, resp. rate 17, height 5\' 10"  (1.778 m), weight 75.751 kg (167 lb), SpO2 96 %.  HEENT-  Normocephalic, no lesions, without obvious abnormality.  Normal external eye and conjunctiva.  Normal TM's bilaterally.  Normal auditory canals and external ears. Normal external nose, mucus membranes and septum.  Normal pharynx. Cardiovascular- S1, S2 normal, pulses palpable throughout   Lungs- chest clear, no wheezing, rales, normal symmetric air entry Abdomen- soft,  non-tender; bowel sounds normal; no masses,  no organomegaly Extremities- no edema Lymph-no adenopathy palpable Musculoskeletal-no joint tenderness, deformity or swelling Skin-warm and dry, no hyperpigmentation, vitiligo, or suspicious lesions  Neurological Examination Mental Status: Alert, oriented, thought content appropriate.  Speech fluent without evidence of aphasia.  Able to follow 3 step commands without difficulty. Cranial Nerves: II: Discs flat bilaterally; Visual fields grossly normal, pupils equal, round, reactive to light and accommodation III,IV, VI: ptosis not present, extra-ocular motions intact bilaterally V,VII: smile symmetric, facial light touch sensation normal bilaterally VIII: hearing normal bilaterally IX,X: gag reflex present XI: bilateral shoulder shrug XII: midline tongue extension Motor: Right : Upper extremity   5/5        Left:     Upper extremity   5/5  Lower extremity   Does not allow testing due to pain     Lower extremity   5/5 Tone and bulk:normal tone throughout; no atrophy noted Sensory: Pinprick and light touch  intact throughout, bilaterally Deep Tendon Reflexes: 2+ and symmetric throughout Plantars: Right: downgoing   Left: downgoing Cerebellar: Normal finger-to-nose testing and left heel-to-shin testing Gait: not tested due to pain   Laboratory Studies:   Basic Metabolic Panel:  Recent Labs Lab 09/26/15 1936 09/30/15 1502 10/01/15 1225 10/02/15 1408 10/03/15 0632  NA 142 137 138 142 135  K 3.6 2.9* 3.8 3.1* 3.0*  CL 101 98* 104 107 99*  CO2 27 27 25 28 29   GLUCOSE 91 112* 103* 124* 138*  BUN 6 11 17 7 7   CREATININE 0.85 0.97 0.87 0.71 0.86  CALCIUM 9.3 9.1 8.7* 8.7* 8.0*  MG  --   --   --  2.1  --   PHOS  --   --   --  3.9  --     Liver Function Tests:  Recent Labs Lab 09/30/15 1502 10/01/15 1225 10/02/15 1408  AST 16 21 16   ALT 11* 13* 12*  ALKPHOS 97 91 100  BILITOT 0.9 0.6 1.1  PROT 6.5 6.5 6.6  ALBUMIN 4.4 4.1 4.4    Recent Labs Lab 10/01/15 1225  LIPASE 18    Recent Labs Lab 10/02/15 1421  AMMONIA <9*    CBC:  Recent Labs Lab 09/26/15 1936 09/30/15 1502 10/01/15 1225 10/02/15 1408 10/03/15 0632  WBC 6.3 6.6 6.6 5.9 9.6  NEUTROABS  --   --  3.5  --   --   HGB 14.1 14.0 13.2 13.2 14.0  HCT 40.4 40.6 37.9* 37.9* 41.1  MCV 84.3 84.9 85.6 86.1 88.6  PLT 170 194 169 150 166    Cardiac Enzymes:  Recent Labs Lab 10/02/15 1408  TROPONINI <0.03    BNP: Invalid input(s): POCBNP  CBG:  Recent Labs Lab 09/26/15 2040 09/30/15 1503  GLUCAP 117* 118*    Microbiology: Results for orders placed or performed during the hospital encounter of 09/24/15  Gastrointestinal Panel by PCR , Stool     Status: None   Collection Time: 09/25/15  6:57 AM  Result Value Ref Range Status   Campylobacter species NOT DETECTED NOT DETECTED Final   Plesimonas shigelloides NOT DETECTED NOT DETECTED Final   Salmonella species NOT DETECTED NOT DETECTED Final   Yersinia enterocolitica NOT DETECTED NOT DETECTED Final   Vibrio species NOT DETECTED NOT  DETECTED Final   Vibrio cholerae NOT DETECTED NOT DETECTED Final   Enteroaggregative E coli (EAEC) NOT DETECTED NOT DETECTED Final   Enteropathogenic E coli (EPEC) NOT DETECTED NOT DETECTED Final  Enterotoxigenic E coli (ETEC) NOT DETECTED NOT DETECTED Final   Shiga like toxin producing E coli (STEC) NOT DETECTED NOT DETECTED Final   E. coli O157 NOT DETECTED NOT DETECTED Final   Shigella/Enteroinvasive E coli (EIEC) NOT DETECTED NOT DETECTED Final   Cryptosporidium NOT DETECTED NOT DETECTED Final   Cyclospora cayetanensis NOT DETECTED NOT DETECTED Final   Entamoeba histolytica NOT DETECTED NOT DETECTED Final   Giardia lamblia NOT DETECTED NOT DETECTED Final   Adenovirus F40/41 NOT DETECTED NOT DETECTED Final   Astrovirus NOT DETECTED NOT DETECTED Final   Norovirus GI/GII NOT DETECTED NOT DETECTED Final   Rotavirus A NOT DETECTED NOT DETECTED Final   Sapovirus (I, II, IV, and V) NOT DETECTED NOT DETECTED Final  C difficile quick scan w PCR reflex     Status: None   Collection Time: 09/25/15  8:59 AM  Result Value Ref Range Status   C Diff antigen NEGATIVE NEGATIVE Final   C Diff toxin NEGATIVE NEGATIVE Final   C Diff interpretation Negative for C. difficile  Final    Coagulation Studies: No results for input(s): LABPROT, INR in the last 72 hours.  Urinalysis:  Recent Labs Lab 10/01/15 1226 10/02/15 1421  COLORURINE YELLOW* YELLOW*  LABSPEC 1.018 1.010  PHURINE 6.0 5.0  GLUCOSEU NEGATIVE NEGATIVE  HGBUR NEGATIVE NEGATIVE  BILIRUBINUR NEGATIVE NEGATIVE  KETONESUR NEGATIVE NEGATIVE  PROTEINUR NEGATIVE NEGATIVE  NITRITE NEGATIVE NEGATIVE  LEUKOCYTESUR NEGATIVE NEGATIVE    Lipid Panel:     Component Value Date/Time   CHOL 190 04/09/2007 2304   TRIG 228* 04/09/2007 2304   HDL 44 04/09/2007 2304   CHOLHDL 4.3 Ratio 04/09/2007 2304   VLDL 46* 04/09/2007 2304   LDLCALC 100* 04/09/2007 2304    HgbA1C: No results found for: HGBA1C  Urine Drug Screen:     Component  Value Date/Time   LABOPIA POSITIVE* 10/02/2015 1421   LABBENZ POSITIVE* 10/02/2015 1421   AMPHETMU NONE DETECTED 10/02/2015 1421   THCU NONE DETECTED 10/02/2015 1421   LABBARB NONE DETECTED 10/02/2015 1421    Alcohol Level:  Recent Labs Lab 10/01/15 1225 10/02/15 1408  ETH <5 <5    Other results: EKG: sinus tachycardia at 109 bpm.  Imaging: Dg Knee 1-2 Views Right  10/02/2015  CLINICAL DATA:  60 year old male with fall and right knee pain. EXAM: RIGHT KNEE - 1-2 VIEW COMPARISON:  None. FINDINGS: There is no acute fracture or dislocation. There is mild osteopenia. There is mild narrowing of the medial compartment compatible with osteoarthritic changes. No joint effusion. The soft tissues appear unremarkable with IMPRESSION: No acute osseous pathology. Electronically Signed   By: Anner Crete M.D.   On: 10/02/2015 23:12   Ct Head Wo Contrast  10/02/2015  CLINICAL DATA:  60 year old male with altered mental status. Seen for the same yesterday. EXAM: CT HEAD WITHOUT CONTRAST TECHNIQUE: Contiguous axial images were obtained from the base of the skull through the vertex without intravenous contrast. COMPARISON:  Recent prior head CT 10/01/2015 FINDINGS: Negative for acute intracranial hemorrhage, acute infarction, mass, mass effect, hydrocephalus or midline shift. Gray-white differentiation is preserved throughout. No acute soft tissue or calvarial abnormality. The globes and orbits are symmetric and unremarkable. Normal aeration of the mastoid air cells and visualized paranasal sinuses. Perhaps minimal chronic microvascular ischemic white matter change. IMPRESSION: Negative head CT. Electronically Signed   By: Jacqulynn Cadet M.D.   On: 10/02/2015 20:33   Ct Head Wo Contrast  10/01/2015  CLINICAL DATA:  Unwitnessed fall,  altered mental status EXAM: CT HEAD WITHOUT CONTRAST CT CERVICAL SPINE WITHOUT CONTRAST TECHNIQUE: Multidetector CT imaging of the head and cervical spine was performed  following the standard protocol without intravenous contrast. Multiplanar CT image reconstructions of the cervical spine were also generated. COMPARISON:  Head CT dated 09/30/2015 FINDINGS: CT HEAD FINDINGS No evidence of parenchymal hemorrhage or extra-axial fluid collection. No mass lesion, mass effect, or midline shift. No CT evidence of acute infarction. Subcortical white matter and periventricular small vessel ischemic changes. Intracranial atherosclerosis. Mild cortical atrophy.  No ventriculomegaly. Partial opacification of the right ethmoid air cells. Mastoid air cells are clear. No evidence of calvarial fracture. CT CERVICAL SPINE FINDINGS Mild exaggerated lower cervical lordosis. No evidence of fracture or dislocation. Vertebral body heights are maintained. Dens appears intact. No prevertebral soft tissue swelling. Moderate multilevel degenerative changes. Visualized thyroid is unremarkable. Visualized lung apices are clear. IMPRESSION: No evidence of acute intracranial abnormality. Mild small vessel ischemic changes. No evidence of traumatic injury to the cervical spine. Moderate multilevel degenerative changes. Electronically Signed   By: Julian Hy M.D.   On: 10/01/2015 12:55   Ct Cervical Spine Wo Contrast  10/01/2015  CLINICAL DATA:  Unwitnessed fall, altered mental status EXAM: CT HEAD WITHOUT CONTRAST CT CERVICAL SPINE WITHOUT CONTRAST TECHNIQUE: Multidetector CT imaging of the head and cervical spine was performed following the standard protocol without intravenous contrast. Multiplanar CT image reconstructions of the cervical spine were also generated. COMPARISON:  Head CT dated 09/30/2015 FINDINGS: CT HEAD FINDINGS No evidence of parenchymal hemorrhage or extra-axial fluid collection. No mass lesion, mass effect, or midline shift. No CT evidence of acute infarction. Subcortical white matter and periventricular small vessel ischemic changes. Intracranial atherosclerosis. Mild cortical  atrophy.  No ventriculomegaly. Partial opacification of the right ethmoid air cells. Mastoid air cells are clear. No evidence of calvarial fracture. CT CERVICAL SPINE FINDINGS Mild exaggerated lower cervical lordosis. No evidence of fracture or dislocation. Vertebral body heights are maintained. Dens appears intact. No prevertebral soft tissue swelling. Moderate multilevel degenerative changes. Visualized thyroid is unremarkable. Visualized lung apices are clear. IMPRESSION: No evidence of acute intracranial abnormality. Mild small vessel ischemic changes. No evidence of traumatic injury to the cervical spine. Moderate multilevel degenerative changes. Electronically Signed   By: Julian Hy M.D.   On: 10/01/2015 12:55   Dg Chest Portable 1 View  10/02/2015  CLINICAL DATA:  Altered mental status.  COPD. EXAM: PORTABLE CHEST 1 VIEW COMPARISON:  09/24/2015 FINDINGS: The heart size and mediastinal contours are within normal limits. Both lungs are clear. The visualized skeletal structures are unremarkable. IMPRESSION: Normal chest. Electronically Signed   By: Lorriane Shire M.D.   On: 10/02/2015 14:42     Assessment/Plan: 60 year old male with a 2 week history of starring episodes and confusion.  These episodes per patient and family are distinctly different from his syncopal episodes that he has had in the past.  Head CT personally reviewed and shows no acute changes.  UDS positive for PCP which is concerning in the face of these episodes that he presents with and his daily medications.  Although work up for syncope has been exhaustive in the past would repeat some studies since episodes have changed.  Patient has had no further episodes since admission.    Recommendations: 1.  EEG 2.  MRI of the brain without contrast  Alexis Goodell, MD Neurology [phone] 10/03/2015, 11:40 AM

## 2015-10-04 DIAGNOSIS — G9340 Encephalopathy, unspecified: Secondary | ICD-10-CM | POA: Diagnosis not present

## 2015-10-04 LAB — POTASSIUM: Potassium: 4.2 mmol/L (ref 3.5–5.1)

## 2015-10-04 MED ORDER — FLUTICASONE PROPIONATE 50 MCG/ACT NA SUSP: 2.0000 | Freq: Every day | NASAL | Status: DC

## 2015-10-04 MED ORDER — FLUTICASONE PROPIONATE 50 MCG/ACT NA SUSP: 2.0000 | Freq: Every day | NASAL | Status: AC

## 2015-10-04 NOTE — Evaluation (Signed)
Physical Therapy Evaluation Patient Details Name: Alexander [name] MRN: [id number] DOB: [birth year] Today's Date: 10/04/2015   History of Present Illness  Alexander [name] is an 60 y.o. male with a history of syncope related to [facility] who for the past 2 weeks has had episodes of confusion.  The patient reports that he is amnestic of the events.  Wife reports episodes of starring into space as well as episodes when he becomes confused and at times combative.  The starring spells last a few seconds to minutes.  The confusion episodes may last a few hours.  Reports that on yesterday was getting ready for an appointment and noted that the patient was confused.  Was putting underwear on over his pants.  Talking was difficult to understand.  Became combative.  At times was shaking and "eyes back in his head".  Patient was brought to the ED where he became gradually more alert and less confused.   Wife reports episodes have been at a frequency of at least 5 per day but often more.  Sleep has been restless.  Per medical record pt has returned to baseline level of function. Pt endorses approximately 30 falls over the last 2 weeks and "countless" falls over the last 12 months. Pt reports frequent syncope which has been related to his BP. Does not always occur with position changes.   Clinical Impression  Pt appears somewhat deconditioned however his strength is functional for bed mobility, transfers, and ambulation. Pt endorses approximately 30 falls over the last 2 weeks and relates most falls to syncopal episodes. Pt states that syncope is related to BP however can occur without position changes. He just completed 2 weeks of physical therapy without reported improvement. States that he cannot participate well with physical therapy due to physical limitations and overall weakness. Pt uses a cane at home and during PT session is very unsafe with use of cane. Recommend he use a rollator at all times at home as he is safe and  steady during ambulation with therapist while using walker. Pt will need 24/7 supervision due to frequent falls and sycnope. Discussed safety measures if he can anticipate syncopal events such as lowering himself to the ground to prevent trauma from falls. Pt is not a SNF candidate given his current level of function. Additionally he endorses that PT did not benefit him over the last 2 weeks and he was limited in his ability to participate. 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 CSW notified of request and she will pursue further to determine if he would warrant a referral. Pt is safe to return home with family and 24/7 assistance. No further PT needs after discharge but will follow while admitted to prevent functional decline.    Follow Up Recommendations No PT follow up;Supervision/Assistance - 24 hour;Other (comment) (Use rolling walker at all time)    Equipment Recommendations  None recommended by PT    Recommendations for Other Services       Precautions / Restrictions Precautions Precautions: Fall Restrictions Weight Bearing Restrictions: No      Mobility  Bed Mobility Overal bed mobility: Independent             General bed mobility comments: Good sequencing and strength noted with bed mobility  Transfers Overall transfer level: Needs assistance Equipment used: Rolling walker (2 wheeled) Transfers: Sit to/from Stand Sit to Stand: Min guard  General transfer comment: Pt is steady with sit to stand transfers with use of UE support. Safe hand placement and good stability with rolling walker  Ambulation/Gait Ambulation/Gait assistance: Min assist Ambulation Distance (Feet): 250 Feet Assistive device: Rolling walker (2 wheeled) Gait Pattern/deviations: Decreased step length - left;Decreased step length - right;Scissoring Gait velocity: Decreased but functional for limited community mobility   General  Gait Details: Infrequent crossover gait however mostly stable with decreased step length. With use of rolling walker pt demonstrates good stability and able to perform head turns without LOB. Attempted to ambulate with single point cane and pt is less steady and with head turns has one loss of balance requiring therapist correction to prevent fall. +2 for ambulation with chair follow by +3 due to history of frequent syncope. Pt unsafe with spc. Reports increase in bilateral knee pain, R worse than L during ambulation however eases with offloading by rolling walker  Stairs            Wheelchair Mobility    Modified Rankin (Stroke Patients Only)       Balance Overall balance assessment: Needs assistance Sitting-balance support: No upper extremity supported Sitting balance-Leahy Scale: Good     Standing balance support: No upper extremity supported Standing balance-Leahy Scale: Fair Standing balance comment: Fair in wide stance. Increased sway in narrow stance but no loss of balance Rhomberg positive within 2-3 seconds. Single leg balance <1 second bilaterally                             Pertinent Vitals/Pain Pain Assessment: 0-10 Pain Score: 9  Pain Location: R knee pain, 5/10 L knee pain. Pt states that knee pain is from recent falls. Pt states that radiographs were negative for fracture Pain Intervention(s): Monitored during session;Limited activity within patient's tolerance    Home Living Family/patient expects to be discharged to:: Private residence Living Arrangements: Spouse/significant other Available Help at Discharge: Family;Available 24 hours/day Type of Home: House Home Access: Stairs to enter Entrance Stairs-Rails: None Entrance Stairs-Number of Steps: 2 Home Layout: Multi-level;Able to live on main level with bedroom/bathroom Home Equipment: Walker - 4 wheels;Cane - single point;Shower seat;Grab bars - tub/shower;Wheelchair - manual;Hospital bed       Prior Function Level of Independence: Independent with assistive device(s)         Comments: Patient uses cane at home, rollator in the community. States that his home "is not big enough" to use the walker in the house     Hand Dominance   Dominant Hand: Right    Extremity/Trunk Assessment   Upper Extremity Assessment: Generalized weakness (Functional for bed mobility, transfers, and ambulation. )           Lower Extremity Assessment: Generalized weakness (Functional for bed mobility, transfers, and ambulation.)         Communication   Communication: No difficulties  Cognition Arousal/Alertness: Awake/alert Behavior During Therapy: WFL for tasks assessed/performed Overall Cognitive Status: Within Functional Limits for tasks assessed (AOx4)                      General Comments      Exercises        Assessment/Plan    PT Assessment Patient needs continued PT services  PT Diagnosis Difficulty walking;Abnormality of gait;Generalized weakness;Acute pain   PT Problem List Decreased strength;Decreased activity tolerance;Decreased balance;Decreased mobility;Decreased safety awareness;Pain  PT Treatment Interventions Gait training;DME instruction;Stair training;Therapeutic activities;Therapeutic  exercise;Balance training;Neuromuscular re-education;Patient/family education   PT Goals (Current goals can be found in the Care Plan section) Acute Rehab PT Goals Patient Stated Goal: "I'm getting weaker and I don't see any point in physical therapy." Pt agrees to participate during admission PT Goal Formulation: With patient Time For Goal Achievement: 10/18/15 Potential to Achieve Goals: Poor    Frequency Min 2X/week   Barriers to discharge        Co-evaluation               End of Session Equipment Utilized During Treatment: Gait belt Activity Tolerance: Patient tolerated treatment well Patient left: in bed;with call bell/phone within reach;with bed  alarm set Nurse Communication: Other (comment) (CSW notified of discharge recommendation)    Functional Assessment Tool Used: clinical judgement, single leg balance, Rhomberg Functional Limitation: Mobility: Walking and moving around Mobility: Walking and Moving Around Current Status [id number]): At least 40 percent but less than 60 percent impaired, limited or restricted Mobility: Walking and Moving Around Goal Status [id number]): At least 40 percent but less than 60 percent impaired, limited or restricted    Time: 1335-1400 PT Time Calculation (min) (ACUTE ONLY): 25 min   Charges:   PT Evaluation $PT Eval Moderate Complexity: 1 Procedure PT Treatments $Gait Training: 8-22 mins   PT G Codes:   PT G-Codes **NOT FOR INPATIENT CLASS** Functional Assessment Tool Used: clinical judgement, single leg balance, Rhomberg Functional Limitation: Mobility: Walking and moving around Mobility: Walking and Moving Around Current Status [id number]): At least 40 percent but less than 60 percent impaired, limited or restricted Mobility: Walking and Moving Around Goal Status [phone]): At least 40 percent but less than 60 percent impaired, limited or restricted   Phillips Grout PT, DPT   Huprich,Jason 10/04/2015, 2:26 PM

## 2015-10-04 NOTE — Progress Notes (Signed)
Patient discharged home. DC instructions provided and explained. Medications reviewed. Rx given. All questions answered. Pt stable at discharge. 

## 2015-10-04 NOTE — Discharge Instructions (Addendum)
Information on my medicine - XARELTO (rivaroxaban)  This medication education was reviewed with me or my healthcare representative as part of my discharge preparation.  The pharmacist that spoke with me during my hospital stay was:  Ramond Dial, [place]? Xarelto was prescribed to treat blood clots that may have been found in the veins of your legs (deep vein thrombosis) or in your lungs (pulmonary embolism) and to reduce the risk of them occurring again.  What do you need to know about Xarelto? Your dose is one 20 mg tablet taken ONCE A DAY with your evening meal.  DO NOT stop taking Xarelto without talking to the health care provider who prescribed the medication.  Refill your prescription for 20 mg tablets before you run out.  After discharge, you should have regular check-up appointments with your healthcare provider that is prescribing your Xarelto.  In the future your dose may need to be changed if your kidney function changes by a significant amount.  What do you do if you miss a dose?  If you are taking Xarelto ONCE DAILY and you miss a dose, take it as soon as you remember on the same day then continue your regularly scheduled once daily regimen the next day. Do not take two doses of Xarelto at the same time.   Important Safety Information Xarelto is a blood thinner medicine that can cause bleeding. You should call your healthcare provider right away if you experience any of the following: ? Bleeding from an injury or your nose that does not stop. ? Unusual colored urine (red or dark brown) or unusual colored stools (red or black). ? Unusual bruising for unknown reasons. ? A serious fall or if you hit your head (even if there is no bleeding).  Some medicines may interact with Xarelto and might increase your risk of bleeding while on Xarelto. To help avoid this, consult your healthcare provider or pharmacist prior to using any new  prescription or non-prescription medications, including herbals, vitamins, non-steroidal anti-inflammatory drugs (NSAIDs) and supplements.  This website has more information on Xarelto: [email or web address]   Activity as tolerated per PT recommendations Diet-low salt Follow-up with primary care physician, oncologist and palliative care as recommended

## 2015-10-04 NOTE — Care Management (Addendum)
Patient told me yesterday that that he has appointment with Dr. Candace Cruise today but told the nurse that it was Thursday- I will check with Dr. Myrna Blazer office- [phone]. Updated notes faxed to [facility]. I spoke with GI at [facility] and patient "does NOT have an appointment with their clinic". Patient states he has appointment with Dr. Candace Cruise for ENDO which may not be on office appointment list.  I confirmed with ENDO that patient DOES have appointment for endoscopy in the morning around 630AM.

## 2015-10-04 NOTE — Consult Note (Addendum)
CC: Episodes of confusion  HPI: Alexander [name] is an 60 y.o. male with a history of syncope related to [facility] who for the past 2 weeks has had episodes of confusion.  The patient reports that he is amnestic of the events.  Wife reports episodes of starring into space as well as episodes when he becomes confused and at times combative.  The starring spells last a few seconds to minutes.  The confusion episodes may last a few hours.  Reports that on yesterday was getting ready for an appointment and noted that the patient was confused.  Was putting underwear on over his pants.  Talking was difficult to understand.  Became combative.  At times was shaking and "eyes back in his head".  Patient was brought to the ED where he became gradually more alert and less confused.   Wife reports episodes have been at a frequency of at least 5 per day but often more.  Sleep has been restless.    D/w family at bedside, pt is currently back to baseline.    Past Medical History  Diagnosis Date  . COPD (chronic obstructive pulmonary disease) ([place])   . Migraine   . Asthma   . BPH (benign prostatic hyperplasia)   . Deviated nasal septum   . DVT (deep venous thrombosis) ([place])   . Erosive esophagitis   . Mantle cell lymphoma ([place])   . GERD (gastroesophageal reflux disease)     Past Surgical History  Procedure Laterality Date  . Throat surgery    . Barryx N/A   . Esophagogastroduodenoscopy (egd) with propofol N/A 02/23/2015    Procedure: ESOPHAGOGASTRODUODENOSCOPY (EGD) WITH PROPOFOL;  Surgeon: Hulen Luster, MD;  Location: [facility] ENDOSCOPY;  Service: Gastroenterology;  Laterality: N/A;  . Esophagogastroduodenoscopy (egd) with propofol N/A 05/04/2015    Procedure: ESOPHAGOGASTRODUODENOSCOPY (EGD) WITH PROPOFOL;  Surgeon: Hulen Luster, MD;  Location: [facility] ENDOSCOPY;  Service: Gastroenterology;  Laterality: N/A;  . Tonsillectomy    . Esophagogastroduodenoscopy (egd) with propofol N/A 07/27/2015    Procedure:  ESOPHAGOGASTRODUODENOSCOPY (EGD) WITH PROPOFOL;  Surgeon: Hulen Luster, MD;  Location: [facility] ENDOSCOPY;  Service: Gastroenterology;  Laterality: N/A;    Family History  Problem Relation Age of Onset  . Mesothelioma Father   . Breast cancer Mother     Social History:  reports that he has quit smoking. He does not have any smokeless tobacco history on file. He reports that he does not drink alcohol or use illicit drugs.  Allergies  Allergen Reactions  . Bee Venom Shortness Of Breath and Swelling  . Ativan [Lorazepam] Other (See Comments)    Reaction:  Dizziness     Medications:  I have reviewed the patient's current medications. Prior to Admission:  Prescriptions prior to admission  Medication Sig Dispense Refill Last Dose  . albuterol (PROAIR HFA) 108 (90 Base) MCG/ACT inhaler Inhale 1-2 puffs into the lungs every 4 (four) hours as needed for wheezing or shortness of breath.    PRN at PRN  . cyanocobalamin 500 MCG tablet Take 500 mcg by mouth daily.   unknown at unknown   . dicyclomine (BENTYL) 20 MG tablet Take 20 mg by mouth 4 (four) times daily -  before meals and at bedtime.   unknown at unknown   . docusate sodium (COLACE) 100 MG capsule Take 200 mg by mouth 2 (two) times daily as needed for mild constipation.   PRN at PRN  . loperamide (IMODIUM) 2 MG capsule Take 2  mg by mouth as needed for diarrhea or loose stools.   PRN at PRN  . midodrine (PROAMATINE) 2.5 MG tablet Take 2.5 mg by mouth 2 (two) times daily with a meal.   unknown at unknown   . morphine (MSIR) 30 MG tablet Take 30 mg by mouth 3 (three) times daily.   unknown at unknown   . omeprazole (PRILOSEC) 20 MG capsule Take 20 mg by mouth 2 (two) times daily.   unknown at unknown   . potassium chloride (K-DUR) 10 MEQ tablet Take 1 tablet (10 mEq total) by mouth 2 (two) times daily. 6 tablet 0 unknown at unknown   . promethazine (PHENERGAN) 25 MG tablet Take 1 tablet (25 mg total) by mouth every 6 (six) hours as needed for  nausea or vomiting. 12 tablet 1 PRN at PRN  . rivaroxaban (XARELTO) 20 MG TABS tablet Take 20 mg by mouth daily with supper.   unknown at unknown  . senna (SENOKOT) 8.6 MG TABS tablet Take 2 tablets by mouth 2 (two) times daily as needed for mild constipation.   PRN at PRN  . SUMAtriptan (IMITREX) 50 MG tablet Take 50 mg by mouth every 2 (two) hours as needed for migraine.    PRN at PRN  . tamsulosin (FLOMAX) 0.4 MG CAPS capsule Take 0.4 mg by mouth at bedtime.   unknown at unknown   . traZODone (DESYREL) 50 MG tablet Take 50 mg by mouth at bedtime.   unknown at unknown   . verapamil (CALAN-SR) 120 MG CR tablet Take 120 mg by mouth daily.   unknown at unknown    Scheduled: . fluticasone  2 spray Each Nare Daily  . pantoprazole  40 mg Oral BID  . potassium chloride  40 mEq Oral BID  . rivaroxaban  20 mg Oral Q supper  . sodium chloride flush  3 mL Intravenous Q12H  . tamsulosin  0.4 mg Oral QHS  . verapamil  120 mg Oral Daily    ROS: History obtained from the patient  General ROS: negative for - chills, fatigue, fever, night sweats, weight gain or weight loss Psychological ROS: negative for - behavioral disorder, hallucinations, memory difficulties, mood swings or suicidal ideation Ophthalmic ROS: negative for - blurry vision, double vision, eye pain or loss of vision ENT ROS: negative for - epistaxis, nasal discharge, oral lesions, sore throat, tinnitus or vertigo Allergy and Immunology ROS: negative for - hives or itchy/watery eyes Hematological and Lymphatic ROS: negative for - bleeding problems, bruising or swollen lymph nodes Endocrine ROS: negative for - galactorrhea, hair pattern changes, polydipsia/polyuria or temperature intolerance Respiratory ROS: negative for - cough, hemoptysis, shortness of breath or wheezing Cardiovascular ROS: negative for - chest pain, dyspnea on exertion, edema or irregular heartbeat Gastrointestinal ROS: negative for - abdominal pain, diarrhea,  hematemesis, nausea/vomiting or stool incontinence Genito-Urinary ROS: negative for - dysuria, hematuria, incontinence or urinary frequency/urgency Musculoskeletal ROS: negative for - joint swelling or muscular weakness Neurological ROS: as noted in HPI Dermatological ROS: negative for rash and skin lesion changes  Physical Examination: Blood pressure 130/80, pulse 83, temperature 97.3 F (36.3 C), temperature source Oral, resp. rate 17, height 5\' 10"  (1.778 m), weight 167 lb (75.751 kg), SpO2 98 %.  HEENT-  Normocephalic, no lesions, without obvious abnormality.  Normal external eye and conjunctiva.  Normal TM's bilaterally.  Normal auditory canals and external ears. Normal external nose, mucus membranes and septum.  Normal pharynx. Cardiovascular- S1, S2 normal, pulses palpable throughout  Lungs- chest clear, no wheezing, rales, normal symmetric air entry Abdomen- soft, non-tender; bowel sounds normal; no masses,  no organomegaly Extremities- no edema Lymph-no adenopathy palpable Musculoskeletal-no joint tenderness, deformity or swelling Skin-warm and dry, no hyperpigmentation, vitiligo, or suspicious lesions  Neurological Examination Mental Status: Alert, oriented, thought content appropriate.  Speech fluent without evidence of aphasia.  Able to follow 3 step commands without difficulty. Cranial Nerves: II: Discs flat bilaterally; Visual fields grossly normal, pupils equal, round, reactive to light and accommodation III,IV, VI: ptosis not present, extra-ocular motions intact bilaterally V,VII: smile symmetric, facial light touch sensation normal bilaterally VIII: hearing normal bilaterally IX,X: gag reflex present XI: bilateral shoulder shrug XII: midline tongue extension Motor: Right : Upper extremity   5/5        Left:     Upper extremity   5/5  Lower extremity   Does not allow testing due to pain     Lower extremity   5/5 Tone and bulk:normal tone throughout; no atrophy  noted Sensory: Pinprick and light touch intact throughout, bilaterally Deep Tendon Reflexes: 2+ and symmetric throughout Plantars: Right: downgoing   Left: downgoing Cerebellar: Normal finger-to-nose testing and left heel-to-shin testing Gait: not tested due to pain   Laboratory Studies:   Basic Metabolic Panel:  Recent Labs Lab 09/30/15 1502 10/01/15 1225 10/02/15 1408 10/03/15 0632  NA 137 138 142 135  K 2.9* 3.8 3.1* 3.0*  CL 98* 104 107 99*  CO2 27 25 28 29   GLUCOSE 112* 103* 124* 138*  BUN 11 17 7 7   CREATININE 0.97 0.87 0.71 0.86  CALCIUM 9.1 8.7* 8.7* 8.0*  MG  --   --  2.1  --   PHOS  --   --  3.9  --     Liver Function Tests:  Recent Labs Lab 09/30/15 1502 10/01/15 1225 10/02/15 1408  AST 16 21 16   ALT 11* 13* 12*  ALKPHOS 97 91 100  BILITOT 0.9 0.6 1.1  PROT 6.5 6.5 6.6  ALBUMIN 4.4 4.1 4.4    Recent Labs Lab 10/01/15 1225  LIPASE 18    Recent Labs Lab 10/02/15 1421  AMMONIA <9*    CBC:  Recent Labs Lab 09/30/15 1502 10/01/15 1225 10/02/15 1408 10/03/15 0632  WBC 6.6 6.6 5.9 9.6  NEUTROABS  --  3.5  --   --   HGB 14.0 13.2 13.2 14.0  HCT 40.6 37.9* 37.9* 41.1  MCV 84.9 85.6 86.1 88.6  PLT 194 169 150 166    Cardiac Enzymes:  Recent Labs Lab 10/02/15 1408  TROPONINI <0.03    BNP: Invalid input(s): POCBNP  CBG:  Recent Labs Lab 09/30/15 1503  GLUCAP 118*    Microbiology: Results for orders placed or performed during the hospital encounter of 09/24/15  Gastrointestinal Panel by PCR , Stool     Status: None   Collection Time: 09/25/15  6:57 AM  Result Value Ref Range Status   Campylobacter species NOT DETECTED NOT DETECTED Final   Plesimonas shigelloides NOT DETECTED NOT DETECTED Final   Salmonella species NOT DETECTED NOT DETECTED Final   Yersinia enterocolitica NOT DETECTED NOT DETECTED Final   Vibrio species NOT DETECTED NOT DETECTED Final   Vibrio cholerae NOT DETECTED NOT DETECTED Final    Enteroaggregative E coli (EAEC) NOT DETECTED NOT DETECTED Final   Enteropathogenic E coli (EPEC) NOT DETECTED NOT DETECTED Final   Enterotoxigenic E coli (ETEC) NOT DETECTED NOT DETECTED Final   Shiga like toxin producing E  coli (STEC) NOT DETECTED NOT DETECTED Final   E. coli O157 NOT DETECTED NOT DETECTED Final   Shigella/Enteroinvasive E coli (EIEC) NOT DETECTED NOT DETECTED Final   Cryptosporidium NOT DETECTED NOT DETECTED Final   Cyclospora cayetanensis NOT DETECTED NOT DETECTED Final   Entamoeba histolytica NOT DETECTED NOT DETECTED Final   Giardia lamblia NOT DETECTED NOT DETECTED Final   Adenovirus F40/41 NOT DETECTED NOT DETECTED Final   Astrovirus NOT DETECTED NOT DETECTED Final   Norovirus GI/GII NOT DETECTED NOT DETECTED Final   Rotavirus A NOT DETECTED NOT DETECTED Final   Sapovirus (I, II, IV, and V) NOT DETECTED NOT DETECTED Final  C difficile quick scan w PCR reflex     Status: None   Collection Time: 09/25/15  8:59 AM  Result Value Ref Range Status   C Diff antigen NEGATIVE NEGATIVE Final   C Diff toxin NEGATIVE NEGATIVE Final   C Diff interpretation Negative for C. difficile  Final    Coagulation Studies: No results for input(s): LABPROT, INR in the last 72 hours.  Urinalysis:   Recent Labs Lab 10/01/15 1226 10/02/15 1421  COLORURINE YELLOW* YELLOW*  LABSPEC 1.018 1.010  PHURINE 6.0 5.0  GLUCOSEU NEGATIVE NEGATIVE  HGBUR NEGATIVE NEGATIVE  BILIRUBINUR NEGATIVE NEGATIVE  KETONESUR NEGATIVE NEGATIVE  PROTEINUR NEGATIVE NEGATIVE  NITRITE NEGATIVE NEGATIVE  LEUKOCYTESUR NEGATIVE NEGATIVE    Lipid Panel:     Component Value Date/Time   CHOL 190 04/09/2007 2304   TRIG 228* 04/09/2007 2304   HDL 44 04/09/2007 2304   CHOLHDL 4.3 Ratio 04/09/2007 2304   VLDL 46* 04/09/2007 2304   LDLCALC 100* 04/09/2007 2304    HgbA1C: No results found for: HGBA1C  Urine Drug Screen:      Component Value Date/Time   LABOPIA POSITIVE* 10/02/2015 1421   LABBENZ  POSITIVE* 10/02/2015 1421   AMPHETMU NONE DETECTED 10/02/2015 1421   THCU NONE DETECTED 10/02/2015 1421   LABBARB NONE DETECTED 10/02/2015 1421    Alcohol Level:   Recent Labs Lab 10/01/15 1225 10/02/15 1408  ETH <5 <5    Other results: EKG: sinus tachycardia at 109 bpm.  Imaging: Dg Knee 1-2 Views Right  10/02/2015  CLINICAL DATA:  60 year old male with fall and right knee pain. EXAM: RIGHT KNEE - 1-2 VIEW COMPARISON:  None. FINDINGS: There is no acute fracture or dislocation. There is mild osteopenia. There is mild narrowing of the medial compartment compatible with osteoarthritic changes. No joint effusion. The soft tissues appear unremarkable with IMPRESSION: No acute osseous pathology. Electronically Signed   By: Anner Crete M.D.   On: 10/02/2015 23:12   Ct Head Wo Contrast  10/02/2015  CLINICAL DATA:  60 year old male with altered mental status. Seen for the same yesterday. EXAM: CT HEAD WITHOUT CONTRAST TECHNIQUE: Contiguous axial images were obtained from the base of the skull through the vertex without intravenous contrast. COMPARISON:  Recent prior head CT 10/01/2015 FINDINGS: Negative for acute intracranial hemorrhage, acute infarction, mass, mass effect, hydrocephalus or midline shift. Gray-white differentiation is preserved throughout. No acute soft tissue or calvarial abnormality. The globes and orbits are symmetric and unremarkable. Normal aeration of the mastoid air cells and visualized paranasal sinuses. Perhaps minimal chronic microvascular ischemic white matter change. IMPRESSION: Negative head CT. Electronically Signed   By: Jacqulynn Cadet M.D.   On: 10/02/2015 20:33   Mr Jodene Nam Head Wo Contrast  10/03/2015  CLINICAL DATA:  Waxing and waning encephalopathy. EXAM: MRI HEAD WITHOUT CONTRAST MRA HEAD WITHOUT CONTRAST TECHNIQUE:  Multiplanar, multiecho pulse sequences of the brain and surrounding structures were obtained without intravenous contrast. Angiographic images  of the head were obtained using MRA technique without contrast. COMPARISON:  08/10/2015 FINDINGS: MRI HEAD FINDINGS Calvarium and upper cervical spine: No focal marrow signal abnormality. Orbits: Negative. Sinuses and Mastoids: There is bilateral effacement of the bilateral nasal cavity, which contained roughly polypoid structures previously. Previous endoscopic sinus surgery with medial maxillary antrostomies and inferior turbinectomies. There has likely also been ethmoidectomies. Anterior nasal septal perforation, chronic. Brain: No acute infarct, hemorrhage, hydrocephalus, or mass lesion. No evidence of large vessel occlusion. No age advanced or asymmetric atrophy. No abnormal cortical or thalamic diffusion abnormality. Stable degree and pattern of patchy hyperintensities the bilateral cerebral white matter, usually from chronic small vessel disease. Demyelinating disease or vasculopathy could give this appearance, but there are no specific features. No chronic hemorrhagic foci. MRA HEAD FINDINGS Left dominant vertebral artery. Symmetric carotid arteries. Present anterior communicating artery. No visible posterior communicating arteries. There is motion artifact at the slab interface which obscures M2, A2, and distal PCA vessels. When accounting for this artifact, there is no evidence of flow limiting stenosis or intracranial vasculopathy. No major branch occlusion. Negative for aneurysm or signs of vascular malformation. Note that on 3D reformats nonvisualized distal right V4 segment is related to small vessel size and late branching PICA on source images. IMPRESSION: 1. No acute finding or change from 2016. No specific explanation for neurologic symptoms. 2. When allowing for motion artifact, negative intracranial MRA. 3. Effaced nasal cavity, likely combination of rhinitis and nasal polyps. Patient is status post endoscopic sinus surgery. Correlate with nasal endoscopy. Electronically Signed   By: Monte Fantasia M.D.   On: 10/03/2015 14:03   Mr Brain Wo Contrast  10/03/2015  CLINICAL DATA:  Waxing and waning encephalopathy. EXAM: MRI HEAD WITHOUT CONTRAST MRA HEAD WITHOUT CONTRAST TECHNIQUE: Multiplanar, multiecho pulse sequences of the brain and surrounding structures were obtained without intravenous contrast. Angiographic images of the head were obtained using MRA technique without contrast. COMPARISON:  08/10/2015 FINDINGS: MRI HEAD FINDINGS Calvarium and upper cervical spine: No focal marrow signal abnormality. Orbits: Negative. Sinuses and Mastoids: There is bilateral effacement of the bilateral nasal cavity, which contained roughly polypoid structures previously. Previous endoscopic sinus surgery with medial maxillary antrostomies and inferior turbinectomies. There has likely also been ethmoidectomies. Anterior nasal septal perforation, chronic. Brain: No acute infarct, hemorrhage, hydrocephalus, or mass lesion. No evidence of large vessel occlusion. No age advanced or asymmetric atrophy. No abnormal cortical or thalamic diffusion abnormality. Stable degree and pattern of patchy hyperintensities the bilateral cerebral white matter, usually from chronic small vessel disease. Demyelinating disease or vasculopathy could give this appearance, but there are no specific features. No chronic hemorrhagic foci. MRA HEAD FINDINGS Left dominant vertebral artery. Symmetric carotid arteries. Present anterior communicating artery. No visible posterior communicating arteries. There is motion artifact at the slab interface which obscures M2, A2, and distal PCA vessels. When accounting for this artifact, there is no evidence of flow limiting stenosis or intracranial vasculopathy. No major branch occlusion. Negative for aneurysm or signs of vascular malformation. Note that on 3D reformats nonvisualized distal right V4 segment is related to small vessel size and late branching PICA on source images. IMPRESSION: 1. No acute  finding or change from 2016. No specific explanation for neurologic symptoms. 2. When allowing for motion artifact, negative intracranial MRA. 3. Effaced nasal cavity, likely combination of rhinitis and nasal polyps. Patient is status post  endoscopic sinus surgery. Correlate with nasal endoscopy. Electronically Signed   By: Monte Fantasia M.D.   On: 10/03/2015 14:03   Dg Chest Portable 1 View  10/02/2015  CLINICAL DATA:  Altered mental status.  COPD. EXAM: PORTABLE CHEST 1 VIEW COMPARISON:  09/24/2015 FINDINGS: The heart size and mediastinal contours are within normal limits. Both lungs are clear. The visualized skeletal structures are unremarkable. IMPRESSION: Normal chest. Electronically Signed   By: Lorriane Shire M.D.   On: 10/02/2015 14:42     Assessment/Plan: 60 year old male with a 2 week history of starring episodes and confusion.  These episodes per patient and family are distinctly different from his syncopal episodes that he has had in the past.  Head CT personally reviewed and shows no acute changes.  UDS positive for PCP which is concerning in the face of these episodes that he presents with and his daily medications.  Although work up for syncope has been exhaustive in the past would repeat some studies since episodes have changed.  Patient has had no further episodes since admission.    Currently back to baseline.  EEG and MRI/MRA no acute abnormality  Pt does have history of mantle cell lymphoma which could be contributing to his on/off confusion.  This could bring on possibility of carcinomatous meningitis.    - Oncology follow up as out pt who are familiar with his case - No anti epileptics at this time - Pt/ot and likely d/c today.   Leotis Pain  10/04/2015, 12:11 PM

## 2015-10-04 NOTE — Telephone Encounter (Signed)
EEG was never schedule. Pts wife call and it was cancel.See phone note.

## 2015-10-04 NOTE — Progress Notes (Signed)
Initial Nutrition Assessment  DOCUMENTATION CODES:   Severe malnutrition in context of chronic illness  INTERVENTION:   Meals and Snacks: Cater to patient preferences; will send am and bedtime snack of yogurt and orange sherbet Medical Food Supplement Therapy: pt refusing Ensure/Boost and Magic Cup. RD offered [employer] which pt reports never having tried but does not want to receive here.   NUTRITION DIAGNOSIS:   Malnutrition related to chronic illness as evidenced by energy intake < or equal to 75% for > or equal to 1 month, moderate depletions of muscle mass, mild depletion of body fat, percent weight loss.  GOAL:   Patient will meet greater than or equal to 90% of their needs  MONITOR:    (Energy Intake, Electrolyte and renal Profile, Anthropometrics, Digestive System)  REASON FOR ASSESSMENT:   Malnutrition Screening Tool    ASSESSMENT:   Pt admitted with AMS; Neurology following, per MD note EEG normal.  Past Medical History  Diagnosis Date  . COPD (chronic obstructive pulmonary disease) ([place])   . Migraine   . Asthma   . BPH (benign prostatic hyperplasia)   . Deviated nasal septum   . DVT (deep venous thrombosis) ([place])   . Erosive esophagitis   . Mantle cell lymphoma ([place])   . GERD (gastroesophageal reflux disease)     Diet Order:  Diet regular Room service appropriate?: Yes; Fluid consistency:: Thin    Current Nutrition: Pt reports eating egg and cheese burrito for breakfast this am. Documented po intake of 80% of meals yesterday and today.   Food/Nutrition-Related History: Pt reports eating very poorly for months PTA. Pt reports does not eat breakfast, eats a yogurt for lunch and dinner is 'different things' such as Hamburger helper servings. Pt reports not liking supplement drinks that he has tried in the past and does not do currently.    Scheduled Medications:  . fluticasone  2 spray Each Nare Daily  . pantoprazole  40 mg Oral BID   . potassium chloride  40 mEq Oral BID  . rivaroxaban  20 mg Oral Q supper  . sodium chloride flush  3 mL Intravenous Q12H  . tamsulosin  0.4 mg Oral QHS  . verapamil  120 mg Oral Daily     Electrolyte/Renal Profile and Glucose Profile:   Recent Labs Lab 10/01/15 1225 10/02/15 1408 10/03/15 0632 10/04/15 1148  NA 138 142 135  --   K 3.8 3.1* 3.0* 4.2  CL 104 107 99*  --   CO2 25 28 29   --   BUN 17 7 7   --   CREATININE 0.87 0.71 0.86  --   CALCIUM 8.7* 8.7* 8.0*  --   MG  --  2.1  --   --   PHOS  --  3.9  --   --   GLUCOSE 103* 124* 138*  --    Protein Profile:   Recent Labs Lab 09/30/15 1502 10/01/15 1225 10/02/15 1408  ALBUMIN 4.4 4.1 4.4    Gastrointestinal Profile: Last BM:  10/01/2015   Nutrition-Focused Physical Exam Findings: Nutrition-Focused physical exam completed. Findings are mild fat depletion, mild-moderate muscle depletion, and no edema.     Weight Change: Pt reports weight of 266lbs couple of years ago. Pt weight 1.5 months ago of 180lbs per [facility] (7% weight loss in 1.5 months)   Height:   Ht Readings from Last 1 Encounters:  10/03/15 5\' 10"  (1.778 m)    Weight:   Wt Readings from Last  1 Encounters:  10/03/15 167 lb (75.751 kg)   Wt Readings from Last 10 Encounters:  10/03/15 167 lb (75.751 kg)  10/01/15 166 lb (75.297 kg)  09/28/15 166 lb (75.297 kg)  09/25/15 166 lb 9.6 oz (75.569 kg)  09/22/15 165 lb (74.844 kg)  08/17/15 180 lb 3.2 oz (81.738 kg)  08/10/15 180 lb (81.647 kg)  08/10/15 180 lb (81.647 kg)  08/09/15 180 lb (81.647 kg)  07/27/15 180 lb (81.647 kg)    BMI:  Body mass index is 23.96 kg/(m^2).  Estimated Nutritional Needs:   Kcal:  2339-2550kcals, (BEE: 1635kcals, TEE: (IF 1.1-1.2)(AF 1.3)  Protein:  76-91g protein (1.0-1.2g/kg)  Fluid:  190-2252mL of fluid (25-49mL/kg)  EDUCATION NEEDS:   No education needs identified at this time   Gap, RD, LDN Pager [phone] Weekend/On-Call Pager [phone]

## 2015-10-04 NOTE — Discharge Summary (Addendum)
[facility] [place] NAME: Alexander [name]    MR#:  [id number]  DATE OF BIRTH:  [birth year]  DATE OF ADMISSION:  10/02/2015 ADMITTING PHYSICIAN: Lance Coon, MD  DATE OF DISCHARGE: 10/04/2015   PRIMARY CARE PHYSICIAN: Perrin Maltese, MD    ADMISSION DIAGNOSIS:  Agitation [R45.1] Knee pain [M25.569] Disorientation [R41.0]  DISCHARGE DIAGNOSIS:  Acute encephalopathy-probably from illicit drugs-resolved completely Mantle cell lymphoma Chronic DVT  SECONDARY DIAGNOSIS:   Past Medical History  Diagnosis Date  . COPD (chronic obstructive pulmonary disease) ([place])   . Migraine   . Asthma   . BPH (benign prostatic hyperplasia)   . Deviated nasal septum   . DVT (deep venous thrombosis) ([place])   . Erosive esophagitis   . Mantle cell lymphoma ([place])   . GERD (gastroesophageal reflux disease)     HOSPITAL COURSE:   Acute encephalopathy -resolved probably from illicit drugs Completely resolved and back to baseline Urine drug screen is positive MRI of the brain and EEG are negative   negative ammonia, negative head CT, her labs large within normal limits except for his urine drug screen as per history of present illness.  Okay to DISCHARGE patient from neurology standpoint Status post PT evaluation, no PT needs identified Outpatient follow-up with oncologist is recommended for further investigation and studies regarding Mantle cell lymphoma  * COPD (chronic obstructive pulmonary disease) ([place]) - continue home meds * History of DVT (deep vein thrombosis) - home dose anticoagulation with xarelto * GERD (gastroesophageal reflux disease) - home dose PPI * BPH (benign prostatic hyperplasia) - continue home meds *History of orthostatic hypotension-patient is started on midodrine by the primary care physician Will discontinue antihypertensive verapamil    DISCHARGE CONDITIONS:   Fair  CONSULTS OBTAINED:  Treatment Team:  Catarina Hartshorn, MD Alexis Goodell, MD Nicholes Mango, MD   PROCEDURES none  DRUG ALLERGIES:   Allergies  Allergen Reactions  . Bee Venom Shortness Of Breath and Swelling  . Ativan [Lorazepam] Other (See Comments)    Reaction:  Dizziness     DISCHARGE MEDICATIONS:   Current Discharge Medication List    START taking these medications   Details  fluticasone (FLONASE) 50 MCG/ACT nasal spray Place 2 sprays into both nostrils daily. Qty: 16 g, Refills: 0      CONTINUE these medications which have NOT CHANGED   Details  albuterol (PROAIR HFA) 108 (90 Base) MCG/ACT inhaler Inhale 1-2 puffs into the lungs every 4 (four) hours as needed for wheezing or shortness of breath.     cyanocobalamin 500 MCG tablet Take 500 mcg by mouth daily.    dicyclomine (BENTYL) 20 MG tablet Take 20 mg by mouth 4 (four) times daily -  before meals and at bedtime.    docusate sodium (COLACE) 100 MG capsule Take 200 mg by mouth 2 (two) times daily as needed for mild constipation.    loperamide (IMODIUM) 2 MG capsule Take 2 mg by mouth as needed for diarrhea or loose stools.    midodrine (PROAMATINE) 2.5 MG tablet Take 2.5 mg by mouth 2 (two) times daily with a meal.    morphine (MSIR) 30 MG tablet Take 30 mg by mouth 3 (three) times daily.    omeprazole (PRILOSEC) 20 MG capsule Take 20 mg by mouth 2 (two) times daily.    potassium chloride (K-DUR) 10 MEQ tablet Take 1 tablet (10 mEq total) by mouth 2 (two) times daily. Qty: 6 tablet, Refills: 0  promethazine (PHENERGAN) 25 MG tablet Take 1 tablet (25 mg total) by mouth every 6 (six) hours as needed for nausea or vomiting. Qty: 12 tablet, Refills: 1    rivaroxaban (XARELTO) 20 MG TABS tablet Take 20 mg by mouth daily with supper.    senna (SENOKOT) 8.6 MG TABS tablet Take 2 tablets by mouth 2 (two) times daily as needed for mild constipation.    SUMAtriptan (IMITREX) 50 MG tablet Take 50 mg by mouth every 2 (two) hours as needed for migraine.      tamsulosin (FLOMAX) 0.4 MG CAPS capsule Take 0.4 mg by mouth at bedtime.    traZODone (DESYREL) 50 MG tablet Take 50 mg by mouth at bedtime.      STOP taking these medications     verapamil (CALAN-SR) 120 MG CR tablet          DISCHARGE INSTRUCTIONS:  Activity as tolerated per PT recommendations Diet-low salt Follow-up with primary care physician, oncologist and palliative care as recommended   DIET:  Low-salt  DISCHARGE CONDITION:  Fair  ACTIVITY:  Activity as tolerated  OXYGEN:  Home Oxygen: No.   Oxygen Delivery: room air  DISCHARGE LOCATION:  home   If you experience worsening of your admission symptoms, develop shortness of breath, life threatening emergency, suicidal or homicidal thoughts you must seek medical attention immediately by calling 911 or calling your MD immediately  if symptoms less severe.  You Must read complete instructions/literature along with all the possible adverse reactions/side effects for all the Medicines you take and that have been prescribed to you. Take any new Medicines after you have completely understood and accpet all the possible adverse reactions/side effects.   Please note  You were cared for by a hospitalist during your hospital stay. If you have any questions about your discharge medications or the care you received while you were in the hospital after you are discharged, you can call the unit and asked to speak with the hospitalist on call if the hospitalist that took care of you is not available. Once you are discharged, your primary care physician will handle any further medical issues. Please note that NO REFILLS for any discharge medications will be authorized once you are discharged, as it is imperative that you return to your primary care physician (or establish a relationship with a primary care physician if you do not have one) for your aftercare needs so that they can reassess your need for medications and monitor your  lab values.     Today  Chief Complaint  Patient presents with  . Altered Mental Status   Patient is clinically improving. Back to his baseline. Altered mental status is resolved. Answering all questions appropriately. Wife, son and grandson are at bedside. Disappointed as we cannot transfer the [facility], according to the case manager and social worker [facility]  ROS:  CONSTITUTIONAL: Denies fevers, chills. Denies any fatigue, weakness.  EYES: Denies blurry vision, double vision, eye pain. EARS, NOSE, THROAT: Denies tinnitus, ear pain, hearing loss. RESPIRATORY: Denies cough, wheeze, shortness of breath.  CARDIOVASCULAR: Denies chest pain, palpitations, edema.  GASTROINTESTINAL: Denies nausea, vomiting, diarrhea, abdominal pain. Denies bright red blood per rectum. GENITOURINARY: Denies dysuria, hematuria. ENDOCRINE: Denies nocturia or thyroid problems. HEMATOLOGIC AND LYMPHATIC: Denies easy bruising or bleeding. SKIN: Denies rash or lesion. MUSCULOSKELETAL: Denies pain in neck, back, shoulder, knees, hips or arthritic symptoms.  NEUROLOGIC: Denies paralysis, paresthesias.  PSYCHIATRIC: Denies anxiety  or depressive symptoms.   VITAL SIGNS:  Blood pressure 130/80, pulse 83, temperature 97.3 F (36.3 C), temperature source Oral, resp. rate 17, height 5\' 10"  (1.778 m), weight 75.751 kg (167 lb), SpO2 98 %.  I/O:    Intake/Output Summary (Last 24 hours) at 10/04/15 1501 Last data filed at 10/04/15 1337  Gross per 24 hour  Intake    603 ml  Output    250 ml  Net    353 ml    PHYSICAL EXAMINATION:  GENERAL:  60 y.o.-year-old patient lying in the bed with no acute distress.  EYES: Pupils equal, round, reactive to light and accommodation. No scleral icterus. Extraocular muscles intact.  HEENT: Head atraumatic, normocephalic. Oropharynx and nasopharynx clear.  NECK:  Supple, no jugular venous distention. No thyroid enlargement, no  tenderness.  LUNGS: Normal breath sounds bilaterally, no wheezing, rales,rhonchi or crepitation. No use of accessory muscles of respiration.  CARDIOVASCULAR: S1, S2 normal. No murmurs, rubs, or gallops.  ABDOMEN: Soft, non-tender, non-distended. Bowel sounds present. No organomegaly or mass.  EXTREMITIES: No pedal edema, cyanosis, or clubbing.  NEUROLOGIC: Cranial nerves II through XII are intact. Muscle strength 5/5 in all extremities. Sensation intact. Gait not checked.  PSYCHIATRIC: The patient is alert and oriented x 3.  SKIN: No obvious rash, lesion, or ulcer.   DATA REVIEW:   CBC  Recent Labs Lab 10/03/15 0632  WBC 9.6  HGB 14.0  HCT 41.1  PLT 166    Chemistries   Recent Labs Lab 10/02/15 1408 10/03/15 0632 10/04/15 1148  NA 142 135  --   K 3.1* 3.0* 4.2  CL 107 99*  --   CO2 28 29  --   GLUCOSE 124* 138*  --   BUN 7 7  --   CREATININE 0.71 0.86  --   CALCIUM 8.7* 8.0*  --   MG 2.1  --   --   AST 16  --   --   ALT 12*  --   --   ALKPHOS 100  --   --   BILITOT 1.1  --   --     Cardiac Enzymes  Recent Labs Lab 10/02/15 1408  TROPONINI <0.03    Microbiology Results  Results for orders placed or performed during the hospital encounter of 09/24/15  Gastrointestinal Panel by PCR , Stool     Status: None   Collection Time: 09/25/15  6:57 AM  Result Value Ref Range Status   Campylobacter species NOT DETECTED NOT DETECTED Final   Plesimonas shigelloides NOT DETECTED NOT DETECTED Final   Salmonella species NOT DETECTED NOT DETECTED Final   Yersinia enterocolitica NOT DETECTED NOT DETECTED Final   Vibrio species NOT DETECTED NOT DETECTED Final   Vibrio cholerae NOT DETECTED NOT DETECTED Final   Enteroaggregative E coli (EAEC) NOT DETECTED NOT DETECTED Final   Enteropathogenic E coli (EPEC) NOT DETECTED NOT DETECTED Final   Enterotoxigenic E coli (ETEC) NOT DETECTED NOT DETECTED Final   Shiga like toxin producing E coli (STEC) NOT DETECTED NOT DETECTED  Final   E. coli O157 NOT DETECTED NOT DETECTED Final   Shigella/Enteroinvasive E coli (EIEC) NOT DETECTED NOT DETECTED Final   Cryptosporidium NOT DETECTED NOT DETECTED Final   Cyclospora cayetanensis NOT DETECTED NOT DETECTED Final   Entamoeba histolytica NOT DETECTED NOT DETECTED Final   Giardia lamblia NOT DETECTED NOT DETECTED Final   Adenovirus F40/41 NOT DETECTED NOT DETECTED Final   Astrovirus NOT DETECTED NOT DETECTED Final  Norovirus GI/GII NOT DETECTED NOT DETECTED Final   Rotavirus A NOT DETECTED NOT DETECTED Final   Sapovirus (I, II, IV, and V) NOT DETECTED NOT DETECTED Final  C difficile quick scan w PCR reflex     Status: None   Collection Time: 09/25/15  8:59 AM  Result Value Ref Range Status   C Diff antigen NEGATIVE NEGATIVE Final   C Diff toxin NEGATIVE NEGATIVE Final   C Diff interpretation Negative for C. difficile  Final    RADIOLOGY:  Dg Knee 1-2 Views Right  10/02/2015  CLINICAL DATA:  60 year old male with fall and right knee pain. EXAM: RIGHT KNEE - 1-2 VIEW COMPARISON:  None. FINDINGS: There is no acute fracture or dislocation. There is mild osteopenia. There is mild narrowing of the medial compartment compatible with osteoarthritic changes. No joint effusion. The soft tissues appear unremarkable with IMPRESSION: No acute osseous pathology. Electronically Signed   By: Anner Crete M.D.   On: 10/02/2015 23:12   Ct Head Wo Contrast  10/02/2015  CLINICAL DATA:  60 year old male with altered mental status. Seen for the same yesterday. EXAM: CT HEAD WITHOUT CONTRAST TECHNIQUE: Contiguous axial images were obtained from the base of the skull through the vertex without intravenous contrast. COMPARISON:  Recent prior head CT 10/01/2015 FINDINGS: Negative for acute intracranial hemorrhage, acute infarction, mass, mass effect, hydrocephalus or midline shift. Gray-white differentiation is preserved throughout. No acute soft tissue or calvarial abnormality. The globes and  orbits are symmetric and unremarkable. Normal aeration of the mastoid air cells and visualized paranasal sinuses. Perhaps minimal chronic microvascular ischemic white matter change. IMPRESSION: Negative head CT. Electronically Signed   By: Jacqulynn Cadet M.D.   On: 10/02/2015 20:33   Ct Head Wo Contrast  10/01/2015  CLINICAL DATA:  Unwitnessed fall, altered mental status EXAM: CT HEAD WITHOUT CONTRAST CT CERVICAL SPINE WITHOUT CONTRAST TECHNIQUE: Multidetector CT imaging of the head and cervical spine was performed following the standard protocol without intravenous contrast. Multiplanar CT image reconstructions of the cervical spine were also generated. COMPARISON:  Head CT dated 09/30/2015 FINDINGS: CT HEAD FINDINGS No evidence of parenchymal hemorrhage or extra-axial fluid collection. No mass lesion, mass effect, or midline shift. No CT evidence of acute infarction. Subcortical white matter and periventricular small vessel ischemic changes. Intracranial atherosclerosis. Mild cortical atrophy.  No ventriculomegaly. Partial opacification of the right ethmoid air cells. Mastoid air cells are clear. No evidence of calvarial fracture. CT CERVICAL SPINE FINDINGS Mild exaggerated lower cervical lordosis. No evidence of fracture or dislocation. Vertebral body heights are maintained. Dens appears intact. No prevertebral soft tissue swelling. Moderate multilevel degenerative changes. Visualized thyroid is unremarkable. Visualized lung apices are clear. IMPRESSION: No evidence of acute intracranial abnormality. Mild small vessel ischemic changes. No evidence of traumatic injury to the cervical spine. Moderate multilevel degenerative changes. Electronically Signed   By: Julian Hy M.D.   On: 10/01/2015 12:55   Ct Head Wo Contrast  09/30/2015  CLINICAL DATA:  60 year old with 4 day history of progressively worsening generalized weakness and confusion. At least 2 falls during this past week due to the weakness.  Patient seen in the emergency department 09/26/2015 for the same symptoms. EXAM: CT HEAD WITHOUT CONTRAST TECHNIQUE: Contiguous axial images were obtained from the base of the skull through the vertex without intravenous contrast. COMPARISON:  09/24/2015 dating back to 08/29/2014. MRI brain 08/10/2015. FINDINGS: Ventricular system normal in size and appearance for age. Dystrophic calcification in the anterior left thalamus, unchanged. No  mass lesion. No midline shift. No acute hemorrhage or hematoma. No extra-axial fluid collections. No evidence of acute infarction. No significant interval change. No skull fracture or other focal osseous abnormality involving the skull. Visualized paranasal sinuses, bilateral mastoid air cells and bilateral middle ear cavities well-aerated. Mild to moderate bilateral carotid siphon and left vertebral artery atherosclerosis. IMPRESSION: No acute intracranial abnormality. Electronically Signed   By: Evangeline Dakin M.D.   On: 09/30/2015 17:35   Ct Cervical Spine Wo Contrast  10/01/2015  CLINICAL DATA:  Unwitnessed fall, altered mental status EXAM: CT HEAD WITHOUT CONTRAST CT CERVICAL SPINE WITHOUT CONTRAST TECHNIQUE: Multidetector CT imaging of the head and cervical spine was performed following the standard protocol without intravenous contrast. Multiplanar CT image reconstructions of the cervical spine were also generated. COMPARISON:  Head CT dated 09/30/2015 FINDINGS: CT HEAD FINDINGS No evidence of parenchymal hemorrhage or extra-axial fluid collection. No mass lesion, mass effect, or midline shift. No CT evidence of acute infarction. Subcortical white matter and periventricular small vessel ischemic changes. Intracranial atherosclerosis. Mild cortical atrophy.  No ventriculomegaly. Partial opacification of the right ethmoid air cells. Mastoid air cells are clear. No evidence of calvarial fracture. CT CERVICAL SPINE FINDINGS Mild exaggerated lower cervical lordosis. No  evidence of fracture or dislocation. Vertebral body heights are maintained. Dens appears intact. No prevertebral soft tissue swelling. Moderate multilevel degenerative changes. Visualized thyroid is unremarkable. Visualized lung apices are clear. IMPRESSION: No evidence of acute intracranial abnormality. Mild small vessel ischemic changes. No evidence of traumatic injury to the cervical spine. Moderate multilevel degenerative changes. Electronically Signed   By: Julian Hy M.D.   On: 10/01/2015 12:55   Mr Jodene Nam Head Wo Contrast  10/03/2015  CLINICAL DATA:  Waxing and waning encephalopathy. EXAM: MRI HEAD WITHOUT CONTRAST MRA HEAD WITHOUT CONTRAST TECHNIQUE: Multiplanar, multiecho pulse sequences of the brain and surrounding structures were obtained without intravenous contrast. Angiographic images of the head were obtained using MRA technique without contrast. COMPARISON:  08/10/2015 FINDINGS: MRI HEAD FINDINGS Calvarium and upper cervical spine: No focal marrow signal abnormality. Orbits: Negative. Sinuses and Mastoids: There is bilateral effacement of the bilateral nasal cavity, which contained roughly polypoid structures previously. Previous endoscopic sinus surgery with medial maxillary antrostomies and inferior turbinectomies. There has likely also been ethmoidectomies. Anterior nasal septal perforation, chronic. Brain: No acute infarct, hemorrhage, hydrocephalus, or mass lesion. No evidence of large vessel occlusion. No age advanced or asymmetric atrophy. No abnormal cortical or thalamic diffusion abnormality. Stable degree and pattern of patchy hyperintensities the bilateral cerebral white matter, usually from chronic small vessel disease. Demyelinating disease or vasculopathy could give this appearance, but there are no specific features. No chronic hemorrhagic foci. MRA HEAD FINDINGS Left dominant vertebral artery. Symmetric carotid arteries. Present anterior communicating artery. No visible  posterior communicating arteries. There is motion artifact at the slab interface which obscures M2, A2, and distal PCA vessels. When accounting for this artifact, there is no evidence of flow limiting stenosis or intracranial vasculopathy. No major branch occlusion. Negative for aneurysm or signs of vascular malformation. Note that on 3D reformats nonvisualized distal right V4 segment is related to small vessel size and late branching PICA on source images. IMPRESSION: 1. No acute finding or change from 2016. No specific explanation for neurologic symptoms. 2. When allowing for motion artifact, negative intracranial MRA. 3. Effaced nasal cavity, likely combination of rhinitis and nasal polyps. Patient is status post endoscopic sinus surgery. Correlate with nasal endoscopy. Electronically Signed   By: Angelica Chessman  Watts M.D.   On: 10/03/2015 14:03   Mr Brain Wo Contrast  10/03/2015  CLINICAL DATA:  Waxing and waning encephalopathy. EXAM: MRI HEAD WITHOUT CONTRAST MRA HEAD WITHOUT CONTRAST TECHNIQUE: Multiplanar, multiecho pulse sequences of the brain and surrounding structures were obtained without intravenous contrast. Angiographic images of the head were obtained using MRA technique without contrast. COMPARISON:  08/10/2015 FINDINGS: MRI HEAD FINDINGS Calvarium and upper cervical spine: No focal marrow signal abnormality. Orbits: Negative. Sinuses and Mastoids: There is bilateral effacement of the bilateral nasal cavity, which contained roughly polypoid structures previously. Previous endoscopic sinus surgery with medial maxillary antrostomies and inferior turbinectomies. There has likely also been ethmoidectomies. Anterior nasal septal perforation, chronic. Brain: No acute infarct, hemorrhage, hydrocephalus, or mass lesion. No evidence of large vessel occlusion. No age advanced or asymmetric atrophy. No abnormal cortical or thalamic diffusion abnormality. Stable degree and pattern of patchy hyperintensities the  bilateral cerebral white matter, usually from chronic small vessel disease. Demyelinating disease or vasculopathy could give this appearance, but there are no specific features. No chronic hemorrhagic foci. MRA HEAD FINDINGS Left dominant vertebral artery. Symmetric carotid arteries. Present anterior communicating artery. No visible posterior communicating arteries. There is motion artifact at the slab interface which obscures M2, A2, and distal PCA vessels. When accounting for this artifact, there is no evidence of flow limiting stenosis or intracranial vasculopathy. No major branch occlusion. Negative for aneurysm or signs of vascular malformation. Note that on 3D reformats nonvisualized distal right V4 segment is related to small vessel size and late branching PICA on source images. IMPRESSION: 1. No acute finding or change from 2016. No specific explanation for neurologic symptoms. 2. When allowing for motion artifact, negative intracranial MRA. 3. Effaced nasal cavity, likely combination of rhinitis and nasal polyps. Patient is status post endoscopic sinus surgery. Correlate with nasal endoscopy. Electronically Signed   By: Monte Fantasia M.D.   On: 10/03/2015 14:03   Dg Chest Portable 1 View  10/02/2015  CLINICAL DATA:  Altered mental status.  COPD. EXAM: PORTABLE CHEST 1 VIEW COMPARISON:  09/24/2015 FINDINGS: The heart size and mediastinal contours are within normal limits. Both lungs are clear. The visualized skeletal structures are unremarkable. IMPRESSION: Normal chest. Electronically Signed   By: Lorriane Shire M.D.   On: 10/02/2015 14:42    EKG:   Orders placed or performed in visit on 10/02/15  . EKG 12-Lead      Management plans discussed with the patient, family and they are in agreement.  CODE STATUS:     Code Status Orders        Start     Ordered   10/02/15 2305  Full code   Continuous     10/02/15 2304    Code Status History    Date Active Date Inactive Code Status  Order ID Comments User Context   09/25/2015  1:32 AM 09/26/2015  2:00 PM Full Code [id number]  Saundra Shelling, MD Inpatient   08/17/2015 10:03 PM 08/19/2015  2:23 PM Full Code [id number]  Gladstone Lighter, MD Inpatient   08/10/2015  8:28 AM 08/12/2015  1:40 PM Full Code [id number]  Saundra Shelling, MD Inpatient   07/17/2015  1:08 AM 07/19/2015  2:13 PM Full Code [id number]  Saundra Shelling, MD Inpatient   05/18/2015  1:29 PM 05/19/2015  3:02 PM Full Code [id number]  Dustin Flock, MD Inpatient    Advance Directive Documentation        Most Recent Value   Type of Advance Directive  Healthcare Power of Attorney, Living will   Pre-existing out of facility DNR order (yellow form or pink MOST form)     "MOST" Form in Place?        TOTAL TIME TAKING CARE OF THIS PATIENT: 45  minutes.    @[facility] @  on 10/04/2015 at 3:01 PM  Between 7am to 6pm - Pager - [phone]  After 6pm go to [email or web address] - password [email or web address] [facility]  Office  [phone]  CC: Primary care physician; Perrin Maltese, MD

## 2015-10-04 NOTE — Telephone Encounter (Signed)
Please cancel the EEG I ordered last week. Thanks.  Rosalin Hawking, MD PhD Stroke Neurology 10/04/2015 2:10 PM

## 2015-10-04 NOTE — Procedures (Signed)
ELECTROENCEPHALOGRAM REPORT   Patient: Alexander [name]       Room #: 150A-AA EEG No. ID: [id number] Age: 60 y.o.        Sex: male Referring Physician: Anselm Jungling Report Date:  10/04/2015        Interpreting Physician: Alexis Goodell  History: Alexander [name] is an 60 y.o. male with episodes of confusion and starring  Medications:  Scheduled: . fluticasone  2 spray Each Nare Daily  . pantoprazole  40 mg Oral BID  . potassium chloride  40 mEq Oral BID  . rivaroxaban  20 mg Oral Q supper  . sodium chloride flush  3 mL Intravenous Q12H  . tamsulosin  0.4 mg Oral QHS  . verapamil  120 mg Oral Daily    Conditions of Recording:  This is a 16 channel EEG carried out with the patient in the awake and drowsy states.  Description:  The waking background activity consists of a low voltage, symmetrical, fairly well organized, 9 Hz alpha activity, seen from the parieto-occipital and posterior temporal regions.  Low voltage fast activity, poorly organized, is seen anteriorly and is at times superimposed on more posterior regions.  A mixture of theta and alpha rhythms are seen from the central and temporal regions. The patient drowses with slowing to irregular, low voltage theta and beta activity.   Stage II sleep is not obtained. No epileptiform activity is noted.   Hyperventilation was not performed.  Intermittent photic stimulation was performed but failed to illicit any change in the tracing.    IMPRESSION: Normal electroencephalogram, awake, drowsy and with activation procedures. There are no focal lateralizing or epileptiform features.   Alexis Goodell, MD Neurology [phone] 10/04/2015, 4:35 AM

## 2015-10-05 ENCOUNTER — Encounter
Admission: RE | Disposition: A | Discharge status: Home / Self Care | Payer: Self-pay | Source: Ambulatory Visit | Attending: Gastroenterology

## 2015-10-05 ENCOUNTER — Ambulatory Visit
Admission: RE | Admit: 2015-10-05 | Discharge: 2015-10-05 | Disposition: A | Discharge status: Home / Self Care | Payer: Medicaid Other | Source: Ambulatory Visit | Attending: Gastroenterology | Admitting: Gastroenterology

## 2015-10-05 ENCOUNTER — Encounter: Payer: Self-pay | Admitting: Anesthesiology

## 2015-10-05 ENCOUNTER — Ambulatory Visit: Payer: Medicaid Other | Admitting: Anesthesiology

## 2015-10-05 DIAGNOSIS — K219 Gastro-esophageal reflux disease without esophagitis: Secondary | ICD-10-CM | POA: Diagnosis not present

## 2015-10-05 DIAGNOSIS — Z86718 Personal history of other venous thrombosis and embolism: Secondary | ICD-10-CM | POA: Diagnosis not present

## 2015-10-05 DIAGNOSIS — I739 Peripheral vascular disease, unspecified: Secondary | ICD-10-CM | POA: Insufficient documentation

## 2015-10-05 DIAGNOSIS — J449 Chronic obstructive pulmonary disease, unspecified: Secondary | ICD-10-CM | POA: Diagnosis not present

## 2015-10-05 DIAGNOSIS — Z8711 Personal history of peptic ulcer disease: Secondary | ICD-10-CM | POA: Insufficient documentation

## 2015-10-05 DIAGNOSIS — Z91030 Bee allergy status: Secondary | ICD-10-CM | POA: Insufficient documentation

## 2015-10-05 DIAGNOSIS — Z7901 Long term (current) use of anticoagulants: Secondary | ICD-10-CM | POA: Insufficient documentation

## 2015-10-05 DIAGNOSIS — F419 Anxiety disorder, unspecified: Secondary | ICD-10-CM | POA: Insufficient documentation

## 2015-10-05 DIAGNOSIS — Z8572 Personal history of non-Hodgkin lymphomas: Secondary | ICD-10-CM | POA: Insufficient documentation

## 2015-10-05 DIAGNOSIS — G43909 Migraine, unspecified, not intractable, without status migrainosus: Secondary | ICD-10-CM | POA: Diagnosis not present

## 2015-10-05 DIAGNOSIS — J45909 Unspecified asthma, uncomplicated: Secondary | ICD-10-CM | POA: Diagnosis not present

## 2015-10-05 DIAGNOSIS — Z888 Allergy status to other drugs, medicaments and biological substances status: Secondary | ICD-10-CM | POA: Insufficient documentation

## 2015-10-05 DIAGNOSIS — K2270 Barrett's esophagus without dysplasia: Secondary | ICD-10-CM | POA: Insufficient documentation

## 2015-10-05 DIAGNOSIS — Z7951 Long term (current) use of inhaled steroids: Secondary | ICD-10-CM | POA: Diagnosis not present

## 2015-10-05 DIAGNOSIS — N400 Enlarged prostate without lower urinary tract symptoms: Secondary | ICD-10-CM | POA: Diagnosis not present

## 2015-10-05 DIAGNOSIS — Z87891 Personal history of nicotine dependence: Secondary | ICD-10-CM | POA: Insufficient documentation

## 2015-10-05 DIAGNOSIS — Z79899 Other long term (current) drug therapy: Secondary | ICD-10-CM | POA: Insufficient documentation

## 2015-10-05 HISTORY — PX: ESOPHAGOGASTRODUODENOSCOPY (EGD) WITH PROPOFOL: SHX5813

## 2015-10-05 SURGERY — ESOPHAGOGASTRODUODENOSCOPY (EGD) WITH PROPOFOL
Anesthesia: General

## 2015-10-05 MED ORDER — PROPOFOL 500 MG/50ML IV EMUL
INTRAVENOUS | Status: DC | PRN
  Administered 2015-10-05: 120 ug/kg/min via INTRAVENOUS

## 2015-10-05 MED ORDER — SODIUM CHLORIDE 0.9 % IV SOLN
INTRAVENOUS | Status: DC
  Administered 2015-10-05: 1000 mL via INTRAVENOUS

## 2015-10-05 MED ORDER — LIDOCAINE HCL (CARDIAC) 20 MG/ML IV SOLN
INTRAVENOUS | Status: DC | PRN
  Administered 2015-10-05: 30 mg via INTRAVENOUS

## 2015-10-05 MED ORDER — STERILE WATER FOR INJECTION IJ SOLN
Freq: Once | INTRAMUSCULAR | Status: AC
Start: 2015-10-05 — End: 2015-10-05
  Administered 2015-10-05: 09:00:00 via OROMUCOSAL
  Filled 2015-10-05: qty 3

## 2015-10-05 MED ORDER — LIDOCAINE HCL (PF) 1 % IJ SOLN
2.0000 mL | Freq: Once | INTRAMUSCULAR | Status: AC
  Administered 2015-10-05: 0.03 mL via INTRADERMAL
  Filled 2015-10-05: qty 2

## 2015-10-05 MED ORDER — SODIUM CHLORIDE 0.9 % IV SOLN
INTRAVENOUS | Status: DC
  Administered 2015-10-05: 07:00:00 via INTRAVENOUS

## 2015-10-05 MED ORDER — MIDAZOLAM HCL 2 MG/2ML IJ SOLN
INTRAMUSCULAR | Status: DC | PRN
  Administered 2015-10-05: 1 mg via INTRAVENOUS

## 2015-10-05 MED ORDER — FENTANYL CITRATE (PF) 100 MCG/2ML IJ SOLN
INTRAMUSCULAR | Status: DC | PRN
  Administered 2015-10-05: 50 ug via INTRAVENOUS

## 2015-10-05 NOTE — Anesthesia Preprocedure Evaluation (Addendum)
Anesthesia Evaluation  Patient identified by MRN, date of birth, ID band Patient awake    Reviewed: Allergy & Precautions, NPO status , Patient's Chart, lab work & pertinent test results, reviewed documented beta blocker date and time   Airway Mallampati: II  TM Distance: >3 FB     Dental  (+) Chipped   Pulmonary asthma , pneumonia, resolved, COPD, former smoker,           Cardiovascular + Peripheral Vascular Disease       Neuro/Psych  Headaches, PSYCHIATRIC DISORDERS Anxiety    GI/Hepatic PUD, GERD  Medicated,  Endo/Other    Renal/GU      Musculoskeletal   Abdominal   Peds  Hematology   Anesthesia Other Findings Tested for syncope. They feel it is vasovagal.  Reproductive/Obstetrics                            Anesthesia Physical Anesthesia Plan  ASA: III  Anesthesia Plan: General   Post-op Pain Management:    Induction: Intravenous  Airway Management Planned: Nasal Cannula  Additional Equipment:   Intra-op Plan:   Post-operative Plan:   Informed Consent: I have reviewed the patients History and Physical, chart, labs and discussed the procedure including the risks, benefits and alternatives for the proposed anesthesia with the patient or authorized representative who has indicated his/her understanding and acceptance.     Plan Discussed with: CRNA  Anesthesia Plan Comments:         Anesthesia Quick Evaluation

## 2015-10-05 NOTE — H&P (Signed)
Primary Care Physician:  Perrin Maltese, MD Primary Gastroenterologist:  Dr. Candace Cruise  Pre-Procedure History & Physical: HPI:  Alexander [name] is a 60 y.o. male is here for an EGD   Past Medical History  Diagnosis Date  . COPD (chronic obstructive pulmonary disease) ([place])   . Migraine   . Asthma   . BPH (benign prostatic hyperplasia)   . Deviated nasal septum   . DVT (deep venous thrombosis) ([place])   . Erosive esophagitis   . 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 GERD (gastroesophageal reflux disease)     Past Surgical History  Procedure Laterality Date  . Throat surgery    . Barryx N/A   . Esophagogastroduodenoscopy (egd) with propofol N/A 02/23/2015    Procedure: ESOPHAGOGASTRODUODENOSCOPY (EGD) WITH PROPOFOL;  Surgeon: Hulen Luster, MD;  Location: [facility] ENDOSCOPY;  Service: Gastroenterology;  Laterality: N/A;  . Esophagogastroduodenoscopy (egd) with propofol N/A 05/04/2015    Procedure: ESOPHAGOGASTRODUODENOSCOPY (EGD) WITH PROPOFOL;  Surgeon: Hulen Luster, MD;  Location: [facility] ENDOSCOPY;  Service: Gastroenterology;  Laterality: N/A;  . Tonsillectomy    . Esophagogastroduodenoscopy (egd) with propofol N/A 07/27/2015    Procedure: ESOPHAGOGASTRODUODENOSCOPY (EGD) WITH PROPOFOL;  Surgeon: Hulen Luster, MD;  Location: [facility] ENDOSCOPY;  Service: Gastroenterology;  Laterality: N/A;    Prior to Admission medications   Medication Sig Start Date End Date Taking? Authorizing Provider  albuterol (PROAIR HFA) 108 (90 Base) MCG/ACT inhaler Inhale 1-2 puffs into the lungs every 4 (four) hours as needed for wheezing or shortness of breath.     Historical Provider, MD  cyanocobalamin 500 MCG tablet Take 500 mcg by mouth daily.    Historical Provider, MD  dicyclomine (BENTYL) 20 MG tablet Take 20 mg by mouth 4 (four) times daily -  before meals and at bedtime.    Historical Provider, MD  docusate sodium (COLACE) 100 MG capsule Take 200 mg by mouth 2 (two) times daily as needed for mild constipation.    Historical  Provider, MD  fluticasone (FLONASE) 50 MCG/ACT nasal spray Place 2 sprays into both nostrils daily. 10/04/15   Nicholes Mango, MD  loperamide (IMODIUM) 2 MG capsule Take 2 mg by mouth as needed for diarrhea or loose stools.    Historical Provider, MD  midodrine (PROAMATINE) 2.5 MG tablet Take 2.5 mg by mouth 2 (two) times daily with a meal.    Historical Provider, MD  morphine (MSIR) 30 MG tablet Take 30 mg by mouth 3 (three) times daily.    Historical Provider, MD  omeprazole (PRILOSEC) 20 MG capsule Take 20 mg by mouth 2 (two) times daily.    Historical Provider, MD  potassium chloride (K-DUR) 10 MEQ tablet Take 1 tablet (10 mEq total) by mouth 2 (two) times daily. 09/30/15   Fredia Sorrow, MD  promethazine (PHENERGAN) 25 MG tablet Take 1 tablet (25 mg total) by mouth every 6 (six) hours as needed for nausea or vomiting. 09/30/15   Fredia Sorrow, MD  rivaroxaban (XARELTO) 20 MG TABS tablet Take 20 mg by mouth daily with supper.    Historical Provider, MD  senna (SENOKOT) 8.6 MG TABS tablet Take 2 tablets by mouth 2 (two) times daily as needed for mild constipation.    Historical Provider, MD  SUMAtriptan (IMITREX) 50 MG tablet Take 50 mg by mouth every 2 (two) hours as needed for migraine.     Historical Provider, MD  tamsulosin (FLOMAX) 0.4 MG CAPS capsule Take 0.4 mg by mouth at bedtime.  Historical Provider, MD  traZODone (DESYREL) 50 MG tablet Take 50 mg by mouth at bedtime.    Historical Provider, MD    Allergies as of 09/27/2015 - Review Complete 09/26/2015  Allergen Reaction Noted  . Ativan [lorazepam] Other (See Comments) 01/12/2015  . Bee venom  04/25/2015    Family History  Problem Relation Age of Onset  . Mesothelioma Father   . Breast cancer Mother     Social History   Social History  . Marital Status: Married    Spouse Name: N/A  . Number of Children: N/A  . Years of Education: N/A   Occupational History  . retired    Social History Main Topics  . Smoking status:  Former Research scientist (life sciences)  . Smokeless tobacco: Not on file  . Alcohol Use: No  . Drug Use: No  . Sexual Activity: Yes   Other Topics Concern  . Not on file   Social History Narrative   Lives with wife and son at home.    Review of Systems: See HPI, otherwise negative ROS  Physical Exam: There were no vitals taken for this visit. General:   Alert,  pleasant and cooperative in NAD Head:  Normocephalic and atraumatic. Neck:  Supple; no masses or thyromegaly. Lungs:  Clear throughout to auscultation.    Heart:  Regular rate and rhythm. Abdomen:  Soft, nontender and nondistended. Normal bowel sounds, without guarding, and without rebound.   Neurologic:  Alert and  oriented x4;  grossly normal neurologically.  Impression/Plan: Alexander [name] is here for an EGD to be performed for Barrett's and low grade dysplasia. Risks, benefits, limitations, and alternatives regarding EGD have been reviewed with the patient.  Questions have been answered.  All parties agreeable.   Makiyla Linch, Lupita Dawn, MD  10/05/2015, 8:01 AM

## 2015-10-05 NOTE — Transfer of Care (Signed)
Immediate Anesthesia Transfer of Care Note  Patient: Alexander [name]  Procedure(s) Performed: Procedure(s): ESOPHAGOGASTRODUODENOSCOPY (EGD) WITH PROPOFOL (N/A)  Patient Location: PACU  Anesthesia Type:General  Level of Consciousness: awake and sedated  Airway & Oxygen Therapy: Patient Spontanous Breathing and Patient connected to nasal cannula oxygen  Post-op Assessment: Report given to RN and Post -op Vital signs reviewed and stable  Post vital signs: Reviewed and stable  Last Vitals:  Filed Vitals:   10/05/15 0743  BP: 124/85  Pulse: 86  Temp: 36.4 C  Resp: 16    Complications: No apparent anesthesia complications

## 2015-10-05 NOTE — Anesthesia Procedure Notes (Signed)
Performed by: COOK-MARTIN, Brailen Macneal Pre-anesthesia Checklist: Patient identified, Emergency Drugs available, Suction available, Patient being monitored and Timeout performed Patient Re-evaluated:Patient Re-evaluated prior to inductionOxygen Delivery Method: Nasal cannula Preoxygenation: Pre-oxygenation with 100% oxygen Intubation Type: IV induction Airway Equipment and Method: Bite block Placement Confirmation: CO2 detector and positive ETCO2     

## 2015-10-05 NOTE — Anesthesia Postprocedure Evaluation (Signed)
Anesthesia Post Note  Patient: Miquel Dunn  Procedure(s) Performed: Procedure(s) (LRB): ESOPHAGOGASTRODUODENOSCOPY (EGD) WITH PROPOFOL (N/A)  Patient location during evaluation: Endoscopy Anesthesia Type: General Level of consciousness: awake and alert Pain management: pain level controlled Vital Signs Assessment: post-procedure vital signs reviewed and stable Respiratory status: spontaneous breathing, nonlabored ventilation, respiratory function stable and patient connected to nasal cannula oxygen Cardiovascular status: blood pressure returned to baseline and stable Postop Assessment: no signs of nausea or vomiting Anesthetic complications: no    Last Vitals:  Filed Vitals:   10/05/15 0940 10/05/15 0950  BP: 129/79 129/79  Pulse: 75 70  Temp:    Resp: 17 14    Last Pain:  Filed Vitals:   10/05/15 0952  PainSc: [place]

## 2015-10-05 NOTE — Op Note (Signed)
[facility] Gastroenterology Patient Name: Alexander [name] Procedure Date: 10/05/2015 8:54 AM MRN: [id number] Account #: [id number] Date of Birth: 22-Oct-1955 Admit Type: Outpatient Age: 60 Room: [facility] ENDO ROOM 4 Gender: Male Note Status: Finalized Procedure:            Upper GI endoscopy Indications:          For therapy of Barrett's esophagus, S/P Barrx x 4. Last                        bx neg for dysplasia. High to low grade dysplasia. Providers:            Lupita Dawn. Candace Cruise, MD Referring MD:         Perrin Maltese, MD (Referring MD) Medicines:            Monitored Anesthesia Care Complications:        No immediate complications. Procedure:            Pre-Anesthesia Assessment:                       - Prior to the procedure, a History and Physical was                        performed, and patient medications, allergies and                        sensitivities were reviewed. The patient's tolerance of                        previous anesthesia was reviewed.                       - The risks and benefits of the procedure and the                        sedation options and risks were discussed with the                        patient. All questions were answered and informed                        consent was obtained.                       - After reviewing the risks and benefits, the patient                        was deemed in satisfactory condition to undergo the                        procedure.                       After obtaining informed consent, the endoscope was                        passed under direct vision. Throughout the procedure,                        the patient's blood pressure, pulse, and oxygen  saturations were monitored continuously. The Endoscope                        was introduced through the mouth, and advanced to the                        second part of duodenum. The upper GI endoscopy was   accomplished without difficulty. The patient tolerated                        the procedure well. Findings:      The esophagus and gastroesophageal junction were examined with white       light and narrow band imaging (NBI) from a forward view and retroflexed       position. There were esophageal mucosal changes consistent with       long-segment Barrett's esophagus. These changes involved the mucosa at       the upper extent of the gastric folds (37 cm from the incisors)       extending to the Z-line (33 cm from the incisors). Circumferential       salmon-colored mucosa was present from 35 to 37 cm and squamous islands       were present from 33 to 35 cm. The maximum longitudinal extent of these       esophageal mucosal changes was 4 cm in length. Focal radiofrequency       ablation of Barrett's esophagus was performed. With the endoscope in       place, the position and extent of the Barrett's mucosa and the anatomic       landmarks including proximal and distal extent of Barrett's mucosa were       noted. Endoscopic visualization identified an ablation site including       the entire visible Barrett's segment. The Barrett's mucosa was irrigated       with N-acetylcysteine (Mucomyst) 1% mixed with water. Esophageal       contents were suctioned. The endoscope was then removed from the       patient. The Barrx-90 radiofrequency ablation catheter was attached to       the tip of the endoscope. The endoscope with the attached radiofrequency       ablation catheter was then passed transorally under direct vision into       the esophagus and advanced to the areas of Barrett's mucosa. The       radiofrequency ablation catheter was placed in contact with the surface       of the Barrett's mucosa under direct visualization and energy was       applied twice at 12 J/cm2. Ablation was repeated in a likewise fashion       to the entire area of suspected Barrett's mucosa. The ablation zone was        cleaned of coagulative debris. The ablation catheter and endoscope were       then removed and the catheter was cleaned. The catheter and endoscope       were reinserted into the esophagus. A second round of ablation was then       performed. Energy was applied twice at 12 J/cm2 to retreat the areas of       Barrett's epithelium that had been treated with the first series of       ablation. The areas of the esophagus where Barrett's mucosa had  been       ablated were examined. Total of 30 treatments were given. Signifiicant       inflammation now gone.      The exam was otherwise without abnormality.      The entire examined stomach was normal.      The examined duodenum was normal. Impression:           - Esophageal mucosal changes consistent with                        long-segment Barrett's esophagus. Treated with                        radiofrequency ablation.                       - The examination was otherwise normal.                       - Normal stomach.                       - Normal examined duodenum.                       - No specimens collected. Recommendation:       - Discharge patient to home.                       - Observe patient's clinical course.                       - The findings and recommendations were discussed with                        the patient.                       - Pain meds prn.                       - Resume xarelto in 2 days.                       - Repeat upper endoscopy in 2 months for surveillance. Procedure Code(s):    --- Professional ---                       [phone], Esophagogastroduodenoscopy, flexible, transoral;                        with ablation of tumor(s), polyp(s), or other lesion(s)                        (includes pre- and post-dilation and guide wire                        passage, when performed) Diagnosis Code(s):    --- Professional ---                       K22.70, Barrett's esophagus without dysplasia CPT copyright 2016  American Medical Association. All rights reserved. The codes documented in this report are preliminary and upon coder review may  be revised to meet current compliance requirements. Eddie Dibbles  Johnell Comings, MD 10/05/2015 9:17:49 AM This report has been signed electronically. Number of Addenda: 0 Note Initiated On: 10/05/2015 8:54 AM      [facility]

## 2015-10-09 ENCOUNTER — Encounter: Payer: Self-pay | Admitting: Gastroenterology

## 2015-10-11 LAB — BLOOD GAS, VENOUS
ACID-BASE EXCESS: 4.3 mmol/L — AB (ref 0.0–3.0)
BICARBONATE: 29.8 meq/L — AB (ref 21.0–28.0)
FIO2: 0.21
PATIENT TEMPERATURE: 37
PCO2 VEN: 47 mmHg (ref 44.0–60.0)
PH VEN: 7.41 (ref 7.320–7.430)

## 2015-10-19 ENCOUNTER — Encounter
Admission: RE | Disposition: A | Discharge status: Home / Self Care | Payer: Self-pay | Source: Ambulatory Visit | Attending: Internal Medicine

## 2015-10-19 ENCOUNTER — Ambulatory Visit
Admission: RE | Admit: 2015-10-19 | Discharge: 2015-10-19 | Disposition: A | Discharge status: Home / Self Care | Payer: Medicaid Other | Source: Ambulatory Visit | Attending: Internal Medicine | Admitting: Internal Medicine

## 2015-10-19 ENCOUNTER — Ambulatory Visit: Payer: Medicaid Other | Admitting: Certified Registered Nurse Anesthetist

## 2015-10-19 DIAGNOSIS — M25561 Pain in right knee: Secondary | ICD-10-CM | POA: Diagnosis not present

## 2015-10-19 DIAGNOSIS — J449 Chronic obstructive pulmonary disease, unspecified: Secondary | ICD-10-CM | POA: Diagnosis not present

## 2015-10-19 DIAGNOSIS — G9340 Encephalopathy, unspecified: Secondary | ICD-10-CM | POA: Diagnosis not present

## 2015-10-19 DIAGNOSIS — R938 Abnormal findings on diagnostic imaging of other specified body structures: Secondary | ICD-10-CM | POA: Diagnosis not present

## 2015-10-19 DIAGNOSIS — K449 Diaphragmatic hernia without obstruction or gangrene: Secondary | ICD-10-CM | POA: Insufficient documentation

## 2015-10-19 DIAGNOSIS — Z808 Family history of malignant neoplasm of other organs or systems: Secondary | ICD-10-CM | POA: Diagnosis not present

## 2015-10-19 DIAGNOSIS — Z803 Family history of malignant neoplasm of breast: Secondary | ICD-10-CM | POA: Insufficient documentation

## 2015-10-19 DIAGNOSIS — R55 Syncope and collapse: Secondary | ICD-10-CM | POA: Insufficient documentation

## 2015-10-19 DIAGNOSIS — R4182 Altered mental status, unspecified: Secondary | ICD-10-CM | POA: Diagnosis not present

## 2015-10-19 DIAGNOSIS — I739 Peripheral vascular disease, unspecified: Secondary | ICD-10-CM | POA: Insufficient documentation

## 2015-10-19 DIAGNOSIS — K2270 Barrett's esophagus without dysplasia: Secondary | ICD-10-CM | POA: Insufficient documentation

## 2015-10-19 DIAGNOSIS — J45909 Unspecified asthma, uncomplicated: Secondary | ICD-10-CM | POA: Insufficient documentation

## 2015-10-19 DIAGNOSIS — Z7901 Long term (current) use of anticoagulants: Secondary | ICD-10-CM | POA: Diagnosis not present

## 2015-10-19 DIAGNOSIS — I899 Noninfective disorder of lymphatic vessels and lymph nodes, unspecified: Secondary | ICD-10-CM | POA: Diagnosis not present

## 2015-10-19 DIAGNOSIS — J3489 Other specified disorders of nose and nasal sinuses: Secondary | ICD-10-CM | POA: Diagnosis not present

## 2015-10-19 DIAGNOSIS — Z888 Allergy status to other drugs, medicaments and biological substances status: Secondary | ICD-10-CM | POA: Insufficient documentation

## 2015-10-19 DIAGNOSIS — Z8572 Personal history of non-Hodgkin lymphomas: Secondary | ICD-10-CM | POA: Insufficient documentation

## 2015-10-19 DIAGNOSIS — Z91030 Bee allergy status: Secondary | ICD-10-CM | POA: Diagnosis not present

## 2015-10-19 DIAGNOSIS — Z87891 Personal history of nicotine dependence: Secondary | ICD-10-CM | POA: Insufficient documentation

## 2015-10-19 DIAGNOSIS — N400 Enlarged prostate without lower urinary tract symptoms: Secondary | ICD-10-CM | POA: Insufficient documentation

## 2015-10-19 DIAGNOSIS — M85861 Other specified disorders of bone density and structure, right lower leg: Secondary | ICD-10-CM | POA: Diagnosis not present

## 2015-10-19 DIAGNOSIS — Z9889 Other specified postprocedural states: Secondary | ICD-10-CM | POA: Diagnosis not present

## 2015-10-19 DIAGNOSIS — Z86718 Personal history of other venous thrombosis and embolism: Secondary | ICD-10-CM | POA: Insufficient documentation

## 2015-10-19 DIAGNOSIS — K210 Gastro-esophageal reflux disease with esophagitis: Secondary | ICD-10-CM | POA: Diagnosis not present

## 2015-10-19 HISTORY — PX: UPPER ESOPHAGEAL ENDOSCOPIC ULTRASOUND (EUS): SHX6562

## 2015-10-19 SURGERY — UPPER ESOPHAGEAL ENDOSCOPIC ULTRASOUND (EUS)
Anesthesia: General

## 2015-10-19 MED ORDER — LIDOCAINE HCL (CARDIAC) 20 MG/ML IV SOLN
INTRAVENOUS | Status: DC | PRN
  Administered 2015-10-19: 100 mg via INTRAVENOUS

## 2015-10-19 MED ORDER — PROPOFOL 500 MG/50ML IV EMUL
INTRAVENOUS | Status: DC | PRN
Start: 2015-10-19 — End: 2015-10-19
  Administered 2015-10-19: 140 ug/kg/min via INTRAVENOUS

## 2015-10-19 MED ORDER — EPHEDRINE SULFATE 50 MG/ML IJ SOLN
INTRAMUSCULAR | Status: DC | PRN
  Administered 2015-10-19: 10 mg via INTRAVENOUS
  Administered 2015-10-19: 5 mg via INTRAVENOUS

## 2015-10-19 MED ORDER — SODIUM CHLORIDE 0.9 % IV SOLN
INTRAVENOUS | Status: DC
  Administered 2015-10-19: 1000 mL via INTRAVENOUS

## 2015-10-19 MED ORDER — PROPOFOL 10 MG/ML IV BOLUS
INTRAVENOUS | Status: DC | PRN
  Administered 2015-10-19: 50 mg via INTRAVENOUS
  Administered 2015-10-19: 20 mg via INTRAVENOUS

## 2015-10-19 MED ORDER — FENTANYL CITRATE (PF) 100 MCG/2ML IJ SOLN
INTRAMUSCULAR | Status: DC | PRN
  Administered 2015-10-19: 50 ug via INTRAVENOUS

## 2015-10-19 NOTE — H&P (View-Only) (Signed)
CC: Episodes of confusion  HPI: Alexander [name] is an 60 y.o. male with a history of syncope related to [facility] who for the past 2 weeks has had episodes of confusion.  The patient reports that he is amnestic of the events.  Wife reports episodes of starring into space as well as episodes when he becomes confused and at times combative.  The starring spells last a few seconds to minutes.  The confusion episodes may last a few hours.  Reports that on yesterday was getting ready for an appointment and noted that the patient was confused.  Was putting underwear on over his pants.  Talking was difficult to understand.  Became combative.  At times was shaking and "eyes back in his head".  Patient was brought to the ED where he became gradually more alert and less confused.   Wife reports episodes have been at a frequency of at least 5 per day but often more.  Sleep has been restless.    D/w family at bedside, pt is currently back to baseline.    Past Medical History  Diagnosis Date  . COPD (chronic obstructive pulmonary disease) ([place])   . Migraine   . Asthma   . BPH (benign prostatic hyperplasia)   . Deviated nasal septum   . DVT (deep venous thrombosis) ([place])   . Erosive esophagitis   . Mantle cell lymphoma ([place])   . GERD (gastroesophageal reflux disease)     Past Surgical History  Procedure Laterality Date  . Throat surgery    . Barryx N/A   . Esophagogastroduodenoscopy (egd) with propofol N/A 02/23/2015    Procedure: ESOPHAGOGASTRODUODENOSCOPY (EGD) WITH PROPOFOL;  Surgeon: Hulen Luster, MD;  Location: [facility] ENDOSCOPY;  Service: Gastroenterology;  Laterality: N/A;  . Esophagogastroduodenoscopy (egd) with propofol N/A 05/04/2015    Procedure: ESOPHAGOGASTRODUODENOSCOPY (EGD) WITH PROPOFOL;  Surgeon: Hulen Luster, MD;  Location: [facility] ENDOSCOPY;  Service: Gastroenterology;  Laterality: N/A;  . Tonsillectomy    . Esophagogastroduodenoscopy (egd) with propofol N/A 07/27/2015    Procedure:  ESOPHAGOGASTRODUODENOSCOPY (EGD) WITH PROPOFOL;  Surgeon: Hulen Luster, MD;  Location: [facility] ENDOSCOPY;  Service: Gastroenterology;  Laterality: N/A;    Family History  Problem Relation Age of Onset  . Mesothelioma Father   . Breast cancer Mother     Social History:  reports that he has quit smoking. He does not have any smokeless tobacco history on file. He reports that he does not drink alcohol or use illicit drugs.  Allergies  Allergen Reactions  . Bee Venom Shortness Of Breath and Swelling  . Ativan [Lorazepam] Other (See Comments)    Reaction:  Dizziness     Medications:  I have reviewed the patient's current medications. Prior to Admission:  Prescriptions prior to admission  Medication Sig Dispense Refill Last Dose  . albuterol (PROAIR HFA) 108 (90 Base) MCG/ACT inhaler Inhale 1-2 puffs into the lungs every 4 (four) hours as needed for wheezing or shortness of breath.    PRN at PRN  . cyanocobalamin 500 MCG tablet Take 500 mcg by mouth daily.   unknown at unknown   . dicyclomine (BENTYL) 20 MG tablet Take 20 mg by mouth 4 (four) times daily -  before meals and at bedtime.   unknown at unknown   . docusate sodium (COLACE) 100 MG capsule Take 200 mg by mouth 2 (two) times daily as needed for mild constipation.   PRN at PRN  . loperamide (IMODIUM) 2 MG capsule Take 2  mg by mouth as needed for diarrhea or loose stools.   PRN at PRN  . midodrine (PROAMATINE) 2.5 MG tablet Take 2.5 mg by mouth 2 (two) times daily with a meal.   unknown at unknown   . morphine (MSIR) 30 MG tablet Take 30 mg by mouth 3 (three) times daily.   unknown at unknown   . omeprazole (PRILOSEC) 20 MG capsule Take 20 mg by mouth 2 (two) times daily.   unknown at unknown   . potassium chloride (K-DUR) 10 MEQ tablet Take 1 tablet (10 mEq total) by mouth 2 (two) times daily. 6 tablet 0 unknown at unknown   . promethazine (PHENERGAN) 25 MG tablet Take 1 tablet (25 mg total) by mouth every 6 (six) hours as needed for  nausea or vomiting. 12 tablet 1 PRN at PRN  . rivaroxaban (XARELTO) 20 MG TABS tablet Take 20 mg by mouth daily with supper.   unknown at unknown  . senna (SENOKOT) 8.6 MG TABS tablet Take 2 tablets by mouth 2 (two) times daily as needed for mild constipation.   PRN at PRN  . SUMAtriptan (IMITREX) 50 MG tablet Take 50 mg by mouth every 2 (two) hours as needed for migraine.    PRN at PRN  . tamsulosin (FLOMAX) 0.4 MG CAPS capsule Take 0.4 mg by mouth at bedtime.   unknown at unknown   . traZODone (DESYREL) 50 MG tablet Take 50 mg by mouth at bedtime.   unknown at unknown   . verapamil (CALAN-SR) 120 MG CR tablet Take 120 mg by mouth daily.   unknown at unknown    Scheduled: . fluticasone  2 spray Each Nare Daily  . pantoprazole  40 mg Oral BID  . potassium chloride  40 mEq Oral BID  . rivaroxaban  20 mg Oral Q supper  . sodium chloride flush  3 mL Intravenous Q12H  . tamsulosin  0.4 mg Oral QHS  . verapamil  120 mg Oral Daily    ROS: History obtained from the patient  General ROS: negative for - chills, fatigue, fever, night sweats, weight gain or weight loss Psychological ROS: negative for - behavioral disorder, hallucinations, memory difficulties, mood swings or suicidal ideation Ophthalmic ROS: negative for - blurry vision, double vision, eye pain or loss of vision ENT ROS: negative for - epistaxis, nasal discharge, oral lesions, sore throat, tinnitus or vertigo Allergy and Immunology ROS: negative for - hives or itchy/watery eyes Hematological and Lymphatic ROS: negative for - bleeding problems, bruising or swollen lymph nodes Endocrine ROS: negative for - galactorrhea, hair pattern changes, polydipsia/polyuria or temperature intolerance Respiratory ROS: negative for - cough, hemoptysis, shortness of breath or wheezing Cardiovascular ROS: negative for - chest pain, dyspnea on exertion, edema or irregular heartbeat Gastrointestinal ROS: negative for - abdominal pain, diarrhea,  hematemesis, nausea/vomiting or stool incontinence Genito-Urinary ROS: negative for - dysuria, hematuria, incontinence or urinary frequency/urgency Musculoskeletal ROS: negative for - joint swelling or muscular weakness Neurological ROS: as noted in HPI Dermatological ROS: negative for rash and skin lesion changes  Physical Examination: Blood pressure 130/80, pulse 83, temperature 97.3 F (36.3 C), temperature source Oral, resp. rate 17, height 5\' 10"  (1.778 m), weight 167 lb (75.751 kg), SpO2 98 %.  HEENT-  Normocephalic, no lesions, without obvious abnormality.  Normal external eye and conjunctiva.  Normal TM's bilaterally.  Normal auditory canals and external ears. Normal external nose, mucus membranes and septum.  Normal pharynx. Cardiovascular- S1, S2 normal, pulses palpable throughout  Lungs- chest clear, no wheezing, rales, normal symmetric air entry Abdomen- soft, non-tender; bowel sounds normal; no masses,  no organomegaly Extremities- no edema Lymph-no adenopathy palpable Musculoskeletal-no joint tenderness, deformity or swelling Skin-warm and dry, no hyperpigmentation, vitiligo, or suspicious lesions  Neurological Examination Mental Status: Alert, oriented, thought content appropriate.  Speech fluent without evidence of aphasia.  Able to follow 3 step commands without difficulty. Cranial Nerves: II: Discs flat bilaterally; Visual fields grossly normal, pupils equal, round, reactive to light and accommodation III,IV, VI: ptosis not present, extra-ocular motions intact bilaterally V,VII: smile symmetric, facial light touch sensation normal bilaterally VIII: hearing normal bilaterally IX,X: gag reflex present XI: bilateral shoulder shrug XII: midline tongue extension Motor: Right : Upper extremity   5/5        Left:     Upper extremity   5/5  Lower extremity   Does not allow testing due to pain     Lower extremity   5/5 Tone and bulk:normal tone throughout; no atrophy  noted Sensory: Pinprick and light touch intact throughout, bilaterally Deep Tendon Reflexes: 2+ and symmetric throughout Plantars: Right: downgoing   Left: downgoing Cerebellar: Normal finger-to-nose testing and left heel-to-shin testing Gait: not tested due to pain   Laboratory Studies:   Basic Metabolic Panel:  Recent Labs Lab 09/30/15 1502 10/01/15 1225 10/02/15 1408 10/03/15 0632  NA 137 138 142 135  K 2.9* 3.8 3.1* 3.0*  CL 98* 104 107 99*  CO2 27 25 28 29   GLUCOSE 112* 103* 124* 138*  BUN 11 17 7 7   CREATININE 0.97 0.87 0.71 0.86  CALCIUM 9.1 8.7* 8.7* 8.0*  MG  --   --  2.1  --   PHOS  --   --  3.9  --     Liver Function Tests:  Recent Labs Lab 09/30/15 1502 10/01/15 1225 10/02/15 1408  AST 16 21 16   ALT 11* 13* 12*  ALKPHOS 97 91 100  BILITOT 0.9 0.6 1.1  PROT 6.5 6.5 6.6  ALBUMIN 4.4 4.1 4.4    Recent Labs Lab 10/01/15 1225  LIPASE 18    Recent Labs Lab 10/02/15 1421  AMMONIA <9*    CBC:  Recent Labs Lab 09/30/15 1502 10/01/15 1225 10/02/15 1408 10/03/15 0632  WBC 6.6 6.6 5.9 9.6  NEUTROABS  --  3.5  --   --   HGB 14.0 13.2 13.2 14.0  HCT 40.6 37.9* 37.9* 41.1  MCV 84.9 85.6 86.1 88.6  PLT 194 169 150 166    Cardiac Enzymes:  Recent Labs Lab 10/02/15 1408  TROPONINI <0.03    BNP: Invalid input(s): POCBNP  CBG:  Recent Labs Lab 09/30/15 1503  GLUCAP 118*    Microbiology: Results for orders placed or performed during the hospital encounter of 09/24/15  Gastrointestinal Panel by PCR , Stool     Status: None   Collection Time: 09/25/15  6:57 AM  Result Value Ref Range Status   Campylobacter species NOT DETECTED NOT DETECTED Final   Plesimonas shigelloides NOT DETECTED NOT DETECTED Final   Salmonella species NOT DETECTED NOT DETECTED Final   Yersinia enterocolitica NOT DETECTED NOT DETECTED Final   Vibrio species NOT DETECTED NOT DETECTED Final   Vibrio cholerae NOT DETECTED NOT DETECTED Final    Enteroaggregative E coli (EAEC) NOT DETECTED NOT DETECTED Final   Enteropathogenic E coli (EPEC) NOT DETECTED NOT DETECTED Final   Enterotoxigenic E coli (ETEC) NOT DETECTED NOT DETECTED Final   Shiga like toxin producing E  coli (STEC) NOT DETECTED NOT DETECTED Final   E. coli O157 NOT DETECTED NOT DETECTED Final   Shigella/Enteroinvasive E coli (EIEC) NOT DETECTED NOT DETECTED Final   Cryptosporidium NOT DETECTED NOT DETECTED Final   Cyclospora cayetanensis NOT DETECTED NOT DETECTED Final   Entamoeba histolytica NOT DETECTED NOT DETECTED Final   Giardia lamblia NOT DETECTED NOT DETECTED Final   Adenovirus F40/41 NOT DETECTED NOT DETECTED Final   Astrovirus NOT DETECTED NOT DETECTED Final   Norovirus GI/GII NOT DETECTED NOT DETECTED Final   Rotavirus A NOT DETECTED NOT DETECTED Final   Sapovirus (I, II, IV, and V) NOT DETECTED NOT DETECTED Final  C difficile quick scan w PCR reflex     Status: None   Collection Time: 09/25/15  8:59 AM  Result Value Ref Range Status   C Diff antigen NEGATIVE NEGATIVE Final   C Diff toxin NEGATIVE NEGATIVE Final   C Diff interpretation Negative for C. difficile  Final    Coagulation Studies: No results for input(s): LABPROT, INR in the last 72 hours.  Urinalysis:   Recent Labs Lab 10/01/15 1226 10/02/15 1421  COLORURINE YELLOW* YELLOW*  LABSPEC 1.018 1.010  PHURINE 6.0 5.0  GLUCOSEU NEGATIVE NEGATIVE  HGBUR NEGATIVE NEGATIVE  BILIRUBINUR NEGATIVE NEGATIVE  KETONESUR NEGATIVE NEGATIVE  PROTEINUR NEGATIVE NEGATIVE  NITRITE NEGATIVE NEGATIVE  LEUKOCYTESUR NEGATIVE NEGATIVE    Lipid Panel:     Component Value Date/Time   CHOL 190 04/09/2007 2304   TRIG 228* 04/09/2007 2304   HDL 44 04/09/2007 2304   CHOLHDL 4.3 Ratio 04/09/2007 2304   VLDL 46* 04/09/2007 2304   LDLCALC 100* 04/09/2007 2304    HgbA1C: No results found for: HGBA1C  Urine Drug Screen:      Component Value Date/Time   LABOPIA POSITIVE* 10/02/2015 1421   LABBENZ  POSITIVE* 10/02/2015 1421   AMPHETMU NONE DETECTED 10/02/2015 1421   THCU NONE DETECTED 10/02/2015 1421   LABBARB NONE DETECTED 10/02/2015 1421    Alcohol Level:   Recent Labs Lab 10/01/15 1225 10/02/15 1408  ETH <5 <5    Other results: EKG: sinus tachycardia at 109 bpm.  Imaging: Dg Knee 1-2 Views Right  10/02/2015  CLINICAL DATA:  60 year old male with fall and right knee pain. EXAM: RIGHT KNEE - 1-2 VIEW COMPARISON:  None. FINDINGS: There is no acute fracture or dislocation. There is mild osteopenia. There is mild narrowing of the medial compartment compatible with osteoarthritic changes. No joint effusion. The soft tissues appear unremarkable with IMPRESSION: No acute osseous pathology. Electronically Signed   By: Anner Crete M.D.   On: 10/02/2015 23:12   Ct Head Wo Contrast  10/02/2015  CLINICAL DATA:  60 year old male with altered mental status. Seen for the same yesterday. EXAM: CT HEAD WITHOUT CONTRAST TECHNIQUE: Contiguous axial images were obtained from the base of the skull through the vertex without intravenous contrast. COMPARISON:  Recent prior head CT 10/01/2015 FINDINGS: Negative for acute intracranial hemorrhage, acute infarction, mass, mass effect, hydrocephalus or midline shift. Gray-white differentiation is preserved throughout. No acute soft tissue or calvarial abnormality. The globes and orbits are symmetric and unremarkable. Normal aeration of the mastoid air cells and visualized paranasal sinuses. Perhaps minimal chronic microvascular ischemic white matter change. IMPRESSION: Negative head CT. Electronically Signed   By: Jacqulynn Cadet M.D.   On: 10/02/2015 20:33   Mr Jodene Nam Head Wo Contrast  10/03/2015  CLINICAL DATA:  Waxing and waning encephalopathy. EXAM: MRI HEAD WITHOUT CONTRAST MRA HEAD WITHOUT CONTRAST TECHNIQUE:  Multiplanar, multiecho pulse sequences of the brain and surrounding structures were obtained without intravenous contrast. Angiographic images  of the head were obtained using MRA technique without contrast. COMPARISON:  08/10/2015 FINDINGS: MRI HEAD FINDINGS Calvarium and upper cervical spine: No focal marrow signal abnormality. Orbits: Negative. Sinuses and Mastoids: There is bilateral effacement of the bilateral nasal cavity, which contained roughly polypoid structures previously. Previous endoscopic sinus surgery with medial maxillary antrostomies and inferior turbinectomies. There has likely also been ethmoidectomies. Anterior nasal septal perforation, chronic. Brain: No acute infarct, hemorrhage, hydrocephalus, or mass lesion. No evidence of large vessel occlusion. No age advanced or asymmetric atrophy. No abnormal cortical or thalamic diffusion abnormality. Stable degree and pattern of patchy hyperintensities the bilateral cerebral white matter, usually from chronic small vessel disease. Demyelinating disease or vasculopathy could give this appearance, but there are no specific features. No chronic hemorrhagic foci. MRA HEAD FINDINGS Left dominant vertebral artery. Symmetric carotid arteries. Present anterior communicating artery. No visible posterior communicating arteries. There is motion artifact at the slab interface which obscures M2, A2, and distal PCA vessels. When accounting for this artifact, there is no evidence of flow limiting stenosis or intracranial vasculopathy. No major branch occlusion. Negative for aneurysm or signs of vascular malformation. Note that on 3D reformats nonvisualized distal right V4 segment is related to small vessel size and late branching PICA on source images. IMPRESSION: 1. No acute finding or change from 2016. No specific explanation for neurologic symptoms. 2. When allowing for motion artifact, negative intracranial MRA. 3. Effaced nasal cavity, likely combination of rhinitis and nasal polyps. Patient is status post endoscopic sinus surgery. Correlate with nasal endoscopy. Electronically Signed   By: Monte Fantasia M.D.   On: 10/03/2015 14:03   Mr Brain Wo Contrast  10/03/2015  CLINICAL DATA:  Waxing and waning encephalopathy. EXAM: MRI HEAD WITHOUT CONTRAST MRA HEAD WITHOUT CONTRAST TECHNIQUE: Multiplanar, multiecho pulse sequences of the brain and surrounding structures were obtained without intravenous contrast. Angiographic images of the head were obtained using MRA technique without contrast. COMPARISON:  08/10/2015 FINDINGS: MRI HEAD FINDINGS Calvarium and upper cervical spine: No focal marrow signal abnormality. Orbits: Negative. Sinuses and Mastoids: There is bilateral effacement of the bilateral nasal cavity, which contained roughly polypoid structures previously. Previous endoscopic sinus surgery with medial maxillary antrostomies and inferior turbinectomies. There has likely also been ethmoidectomies. Anterior nasal septal perforation, chronic. Brain: No acute infarct, hemorrhage, hydrocephalus, or mass lesion. No evidence of large vessel occlusion. No age advanced or asymmetric atrophy. No abnormal cortical or thalamic diffusion abnormality. Stable degree and pattern of patchy hyperintensities the bilateral cerebral white matter, usually from chronic small vessel disease. Demyelinating disease or vasculopathy could give this appearance, but there are no specific features. No chronic hemorrhagic foci. MRA HEAD FINDINGS Left dominant vertebral artery. Symmetric carotid arteries. Present anterior communicating artery. No visible posterior communicating arteries. There is motion artifact at the slab interface which obscures M2, A2, and distal PCA vessels. When accounting for this artifact, there is no evidence of flow limiting stenosis or intracranial vasculopathy. No major branch occlusion. Negative for aneurysm or signs of vascular malformation. Note that on 3D reformats nonvisualized distal right V4 segment is related to small vessel size and late branching PICA on source images. IMPRESSION: 1. No acute  finding or change from 2016. No specific explanation for neurologic symptoms. 2. When allowing for motion artifact, negative intracranial MRA. 3. Effaced nasal cavity, likely combination of rhinitis and nasal polyps. Patient is status post  endoscopic sinus surgery. Correlate with nasal endoscopy. Electronically Signed   By: Monte Fantasia M.D.   On: 10/03/2015 14:03   Dg Chest Portable 1 View  10/02/2015  CLINICAL DATA:  Altered mental status.  COPD. EXAM: PORTABLE CHEST 1 VIEW COMPARISON:  09/24/2015 FINDINGS: The heart size and mediastinal contours are within normal limits. Both lungs are clear. The visualized skeletal structures are unremarkable. IMPRESSION: Normal chest. Electronically Signed   By: Lorriane Shire M.D.   On: 10/02/2015 14:42     Assessment/Plan: 60 year old male with a 2 week history of starring episodes and confusion.  These episodes per patient and family are distinctly different from his syncopal episodes that he has had in the past.  Head CT personally reviewed and shows no acute changes.  UDS positive for PCP which is concerning in the face of these episodes that he presents with and his daily medications.  Although work up for syncope has been exhaustive in the past would repeat some studies since episodes have changed.  Patient has had no further episodes since admission.    Currently back to baseline.  EEG and MRI/MRA no acute abnormality  Pt does have history of mantle cell lymphoma which could be contributing to his on/off confusion.  This could bring on possibility of carcinomatous meningitis.    - Oncology follow up as out pt who are familiar with his case - No anti epileptics at this time - Pt/ot and likely d/c today.   Leotis Pain  10/04/2015, 12:11 PM

## 2015-10-19 NOTE — Op Note (Signed)
[facility] Gastroenterology Patient Name: Alexander [name] Procedure Date: 10/19/2015 8:46 AM MRN: [id number] Account #: [id number] Date of Birth: 09-Jun-1956 Admit Type: Outpatient Age: 60 Room: [facility] ENDO ROOM 3 Gender: Male Note Status: Finalized Procedure:            Upper EUS Indications:          Abnormal endoscopy: known Barrett's with high grade                        dysplasia which has since been ablated with Kandis Mannan, For                        requested EUS to exclude suggestion of malignancy Patient Profile:      Refer to note in patient chart for documentation of                        history and physical. Providers:            Murray Hodgkins. [place] Referring MD:         Lupita Dawn. Candace Cruise, MD (Referring MD), Perrin Maltese, MD                        (Referring MD) Medicines:            Propofol per Anesthesia Complications:        No immediate complications. Procedure:            Pre-Anesthesia Assessment:                       Prior to the procedure, a History and Physical was                        performed, and patient medications and allergies were                        reviewed. The patient is competent. The risks and                        benefits of the procedure and the sedation options and                        risks were discussed with the patient. All questions                        were answered and informed consent was obtained.                        Patient identification and proposed procedure were                        verified by the physician, the nurse and the                        anesthetist in the pre-procedure area. Mental Status                        Examination: alert and oriented. Airway Examination:  normal oropharyngeal airway and neck mobility.                        Respiratory Examination: clear to auscultation. CV                        Examination: normal. Prophylactic Antibiotics: The                         patient does not require prophylactic antibiotics.                        Prior Anticoagulants: The patient has taken no previous                        anticoagulant or antiplatelet agents. ASA Grade                        Assessment: II - A patient with mild systemic disease.                        After reviewing the risks and benefits, the patient was                        deemed in satisfactory condition to undergo the                        procedure. The anesthesia plan was to use monitored                        anesthesia care (MAC). Immediately prior to                        administration of medications, the patient was                        re-assessed for adequacy to receive sedatives. The                        heart rate, respiratory rate, oxygen saturations, blood                        pressure, adequacy of pulmonary ventilation, and                        response to care were monitored throughout the                        procedure. The physical status of the patient was                        re-assessed after the procedure.                       After obtaining informed consent, the endoscope was                        passed under direct vision. Throughout the procedure,                        the patient's blood pressure, pulse,  and oxygen                        saturations were monitored continuously. The EUS GI                        Radial Array [id number] was introduced through the mouth,                        and advanced to the stomach for ultrasound examination                        from the esophagus and stomach. The Endoscope was                        introduced through the mouth, and advanced to the                        second part of duodenum. The upper EUS was accomplished                        without difficulty. The patient tolerated the procedure                        well. Endoscopy images were not captured due to a                         computer interface malfunction. Findings:      Endoscopic Finding :      LA Grade C (one or more mucosal breaks continuous between tops of 2 or       more mucosal folds, less than 75% circumference) esophagitis with       slightly nodular mucosa was found 34 to 38 cm from the incisors.      A small hiatal hernia was present.      The entire examined stomach was endoscopically normal.      The examined duodenum was endoscopically normal.      Endosonographic Finding :      There was no sign of significant endosonographic abnormality in the       esophagus. No masses and no wall thickening were identified.      A few benign-appearing lymph nodes were visualized in the subcarinal       mediastinum (level 7), middle paraesophageal mediastinum (level 34M) and       lower paraesophageal mediastinum (level 8L). The largest measured 12.4       mm by 7.9 mm in maximal cross-sectional diameter. The nodes were oval,       isoechoic and had well defined margins.      Endosonographic imaging in the left lobe of the liver showed no       abnormalities.      The celiac region was visualized and showed no sign of significant       endosonographic abnormality. Impression:           EGD Impressions:                       - LA Grade C esophagitis with slightly nodular mucosa                        (previous  biopsies were negative for dysplasia). No                        mass lesions seen.                       - Small hiatal hernia.                       - Normal stomach.                       - Normal examined duodenum.                       EUS Impressions:                       - There was no sign of significant pathology in the                        esophagus. No mass lesions seen.                       - A few benign appearing lymph nodes were visualized in                        the subcarinal mediastinum (level 7), middle                        paraesophageal mediastinum (level 60M) and lower                         paraesophageal mediastinum (level 8L). Tissue has not                        been obtained. The lymph nodes did not meet EUS                        criteria for malignancy. Likely related to ongoing                        esophagitis.                       - Normal visualized portions of the liver.                       - Normal celiac region.                       - No specimens collected. Recommendation:       - Discharge patient to home (ambulatory).                       - Continue previously prescribed anti-acid medication.                       - The findings and recommendations were discussed with                        the patient and his family.                       -  Return to referring physician as previously scheduled. Procedure Code(s):    --- Professional ---                       [phone], Esophagogastroduodenoscopy, flexible, transoral;                        with endoscopic ultrasound examination limited to the                        esophagus, stomach or duodenum, and adjacent structures Diagnosis Code(s):    --- Professional ---                       K92.9, Disease of digestive system, unspecified                       I89.9, Noninfective disorder of lymphatic vessels and                        lymph nodes, unspecified                       K44.9, Diaphragmatic hernia without obstruction or                        gangrene                       K20.9, Esophagitis, unspecified CPT copyright 2016 American Medical Association. All rights reserved. The codes documented in this report are preliminary and upon coder review may  be revised to meet current compliance requirements. Attending Participation:      I personally performed the entire procedure without the assistance of a       fellow, resident or surgical assistant. [place],  10/19/2015 9:06:35 AM This report has been signed electronically. Number of Addenda: 0 Note Initiated On:  10/19/2015 8:46 AM      [facility]

## 2015-10-19 NOTE — Anesthesia Preprocedure Evaluation (Addendum)
Anesthesia Evaluation  Patient identified by MRN, date of birth, ID band Patient awake    Reviewed: Allergy & Precautions, NPO status , Patient's Chart, lab work & pertinent test results, reviewed documented beta blocker date and time   Airway Mallampati: II  TM Distance: >3 FB     Dental  (+) Chipped   Pulmonary asthma , pneumonia, resolved, COPD,  COPD inhaler, former smoker,    Pulmonary exam normal breath sounds clear to auscultation       Cardiovascular + Peripheral Vascular Disease  Normal cardiovascular exam     Neuro/Psych  Headaches, PSYCHIATRIC DISORDERS Anxiety Hx of encephalopathy   GI/Hepatic Neg liver ROS, PUD, GERD  Medicated and Controlled,  Endo/Other  negative endocrine ROS  Renal/GU negative Renal ROS  negative genitourinary   Musculoskeletal negative musculoskeletal ROS (+)   Abdominal Normal abdominal exam  (+)   Peds negative pediatric ROS (+)  Hematology negative hematology ROS (+)   Anesthesia Other Findings Tested for syncope. They feel it is vasovagal.  Hx of mantle cell lymphoma  Reproductive/Obstetrics                            Anesthesia Physical  Anesthesia Plan  ASA: III  Anesthesia Plan: General   Post-op Pain Management:    Induction: Intravenous  Airway Management Planned: Nasal Cannula  Additional Equipment:   Intra-op Plan:   Post-operative Plan:   Informed Consent: I have reviewed the patients History and Physical, chart, labs and discussed the procedure including the risks, benefits and alternatives for the proposed anesthesia with the patient or authorized representative who has indicated his/her understanding and acceptance.   Dental advisory given  Plan Discussed with: CRNA and Surgeon  Anesthesia Plan Comments:         Anesthesia Quick Evaluation

## 2015-10-19 NOTE — Transfer of Care (Signed)
Immediate Anesthesia Transfer of Care Note  Patient: Alexander [name]  Procedure(s) Performed: Procedure(s): UPPER ESOPHAGEAL ENDOSCOPIC ULTRASOUND (EUS) (N/A)  Patient Location: PACU  Anesthesia Type:General  Level of Consciousness: sedated  Airway & Oxygen Therapy: Patient Spontanous Breathing and Patient connected to nasal cannula oxygen  Post-op Assessment: Report given to RN and Post -op Vital signs reviewed and stable  Post vital signs: Reviewed, stable  Last Vitals:  Filed Vitals:   10/19/15 0744 10/19/15 0900  BP: 101/69   Pulse: 82   Temp: 36.5 C 36.1 C  Resp: 15     Complications: No apparent anesthesia complications

## 2015-10-19 NOTE — Interval H&P Note (Signed)
History and Physical Interval Note:  10/19/2015 8:10 AM  Alexander [name]  has presented today for surgery, with the diagnosis of HX OF DYSPHAGIA - TO EVALUATE ESOPHAGUS  The various methods of treatment have been discussed with the patient and family. After consideration of risks, benefits and other options for treatment, the patient has consented to  Procedure(s): UPPER ESOPHAGEAL ENDOSCOPIC ULTRASOUND (EUS) (N/A) as a surgical intervention .  The patient's history has been reviewed, patient examined, no change in status, stable for surgery.  I have reviewed the patient's chart and labs.  Questions were answered to the patient's satisfaction.     [name]

## 2015-10-20 NOTE — Anesthesia Postprocedure Evaluation (Signed)
Anesthesia Post Note  Patient: Alexander [name]  Procedure(s) Performed: Procedure(s) (LRB): UPPER ESOPHAGEAL ENDOSCOPIC ULTRASOUND (EUS) (N/A)  Patient location during evaluation: PACU Anesthesia Type: General Level of consciousness: awake and alert and oriented Pain management: pain level controlled Vital Signs Assessment: post-procedure vital signs reviewed and stable Respiratory status: spontaneous breathing Cardiovascular status: blood pressure returned to baseline Anesthetic complications: no    Last Vitals:  Filed Vitals:   10/19/15 0910 10/19/15 0920  BP: 90/58 100/64  Pulse: 68 67  Temp:    Resp: 9 9    Last Pain:  Filed Vitals:   10/20/15 0744  PainSc: 0-No pain                 Taleen Prosser

## 2015-12-05 ENCOUNTER — Ambulatory Visit: Payer: Self-pay | Admitting: Neurology

## 2015-12-06 ENCOUNTER — Ambulatory Visit: Payer: Medicaid Other | Admitting: Neurology

## 2015-12-28 ENCOUNTER — Emergency Department: Payer: Medicaid Other

## 2015-12-28 ENCOUNTER — Other Ambulatory Visit: Payer: Self-pay

## 2015-12-28 ENCOUNTER — Emergency Department
Admission: EM | Admit: 2015-12-28 | Discharge: 2015-12-28 | Disposition: A | Discharge status: Home / Self Care | Payer: Medicaid Other | Attending: Emergency Medicine | Admitting: Emergency Medicine

## 2015-12-28 ENCOUNTER — Encounter: Payer: Self-pay | Admitting: Emergency Medicine

## 2015-12-28 DIAGNOSIS — Z87891 Personal history of nicotine dependence: Secondary | ICD-10-CM | POA: Diagnosis not present

## 2015-12-28 DIAGNOSIS — E43 Unspecified severe protein-calorie malnutrition: Secondary | ICD-10-CM | POA: Diagnosis not present

## 2015-12-28 DIAGNOSIS — R112 Nausea with vomiting, unspecified: Secondary | ICD-10-CM | POA: Insufficient documentation

## 2015-12-28 DIAGNOSIS — R109 Unspecified abdominal pain: Secondary | ICD-10-CM | POA: Insufficient documentation

## 2015-12-28 DIAGNOSIS — J449 Chronic obstructive pulmonary disease, unspecified: Secondary | ICD-10-CM | POA: Diagnosis not present

## 2015-12-28 DIAGNOSIS — Z86718 Personal history of other venous thrombosis and embolism: Secondary | ICD-10-CM | POA: Diagnosis not present

## 2015-12-28 DIAGNOSIS — R197 Diarrhea, unspecified: Secondary | ICD-10-CM | POA: Insufficient documentation

## 2015-12-28 DIAGNOSIS — R079 Chest pain, unspecified: Secondary | ICD-10-CM | POA: Insufficient documentation

## 2015-12-28 DIAGNOSIS — Z85828 Personal history of other malignant neoplasm of skin: Secondary | ICD-10-CM | POA: Insufficient documentation

## 2015-12-28 DIAGNOSIS — J45909 Unspecified asthma, uncomplicated: Secondary | ICD-10-CM | POA: Insufficient documentation

## 2015-12-28 LAB — BASIC METABOLIC PANEL
Anion gap: 9 (ref 5–15)
BUN: 13 mg/dL (ref 6–20)
CALCIUM: 9.5 mg/dL (ref 8.9–10.3)
CO2: 28 mmol/L (ref 22–32)
CREATININE: 0.7 mg/dL (ref 0.61–1.24)
Chloride: 102 mmol/L (ref 101–111)
GFR calc Af Amer: >60 mL/min (ref >60)
GFR calc non Af Amer: >60 mL/min (ref >60)
GLUCOSE: 91 mg/dL (ref 65–99)
Potassium: 3.7 mmol/L (ref 3.5–5.1)
Sodium: 139 mmol/L (ref 135–145)

## 2015-12-28 LAB — TROPONIN I

## 2015-12-28 LAB — CBC
HCT: 42.1 % (ref 40.0–52.0)
Hemoglobin: 14.2 g/dL (ref 13.0–18.0)
MCH: 29.5 pg (ref 26.0–34.0)
MCHC: 33.8 g/dL (ref 32.0–36.0)
MCV: 87.3 fL (ref 80.0–100.0)
PLATELETS: 192 10*3/uL (ref 150–440)
RBC: 4.82 MIL/uL (ref 4.40–5.90)
RDW: 13.6 % (ref 11.5–14.5)
WBC: 13.8 10*3/uL — ABNORMAL HIGH (ref 3.8–10.6)

## 2015-12-28 MED ORDER — SODIUM CHLORIDE 0.9 % IV SOLN
Freq: Once | INTRAVENOUS | Status: AC
  Administered 2015-12-28: 19:00:00 via INTRAVENOUS

## 2015-12-28 MED ORDER — METOCLOPRAMIDE HCL 5 MG/ML IJ SOLN
10.0000 mg | Freq: Once | INTRAMUSCULAR | Status: AC
  Administered 2015-12-28: 10 mg via INTRAVENOUS
  Filled 2015-12-28: qty 2

## 2015-12-28 MED ORDER — HYDROMORPHONE HCL 1 MG/ML IJ SOLN
1.0000 mg | Freq: Once | INTRAMUSCULAR | Status: AC
  Administered 2015-12-28: 1 mg via INTRAVENOUS
  Filled 2015-12-28: qty 1

## 2015-12-28 MED ORDER — DIAZEPAM 5 MG PO TABS
5.0000 mg | ORAL_TABLET | Freq: Once | ORAL | Status: AC
  Administered 2015-12-28: 5 mg via ORAL
  Filled 2015-12-28: qty 1

## 2015-12-28 MED ORDER — ONDANSETRON HCL 4 MG/2ML IJ SOLN
4.0000 mg | Freq: Once | INTRAMUSCULAR | Status: AC
  Administered 2015-12-28: 4 mg via INTRAVENOUS
  Filled 2015-12-28: qty 2

## 2015-12-28 MED ORDER — DIAZEPAM 5 MG PO TABS: 5.0000 mg | ORAL_TABLET | Freq: Three times a day (TID) | ORAL | Status: DC | PRN

## 2015-12-28 NOTE — ED Notes (Signed)
Discussed discharge instructions, prescriptions, and follow-up care with patient. No questions or concerns at this time. Pt stable at discharge.  

## 2015-12-28 NOTE — Discharge Instructions (Signed)
Nausea and Vomiting Nausea is a sick feeling that often comes before throwing up (vomiting). Vomiting is a reflex where stomach contents come out of your mouth. Vomiting can cause severe loss of body fluids (dehydration). Children and elderly adults can become dehydrated quickly, especially if they also have diarrhea. Nausea and vomiting are symptoms of a condition or disease. It is important to find the cause of your symptoms. CAUSES   Direct irritation of the stomach lining. This irritation can result from increased acid production (gastroesophageal reflux disease), infection, food poisoning, taking certain medicines (such as nonsteroidal anti-inflammatory drugs), alcohol use, or tobacco use.  Signals from the brain.These signals could be caused by a headache, heat exposure, an inner ear disturbance, increased pressure in the brain from injury, infection, a tumor, or a concussion, pain, emotional stimulus, or metabolic problems.  An obstruction in the gastrointestinal tract (bowel obstruction).  Illnesses such as diabetes, hepatitis, gallbladder problems, appendicitis, kidney problems, cancer, sepsis, atypical symptoms of a heart attack, or eating disorders.  Medical treatments such as chemotherapy and radiation.  Receiving medicine that makes you sleep (general anesthetic) during surgery. DIAGNOSIS Your caregiver may ask for tests to be done if the problems do not improve after a few days. Tests may also be done if symptoms are severe or if the reason for the nausea and vomiting is not clear. Tests may include:  Urine tests.  Blood tests.  Stool tests.  Cultures (to look for evidence of infection).  X-rays or other imaging studies. Test results can help your caregiver make decisions about treatment or the need for additional tests. TREATMENT You need to stay well hydrated. Drink frequently but in small amounts.You may wish to drink water, sports drinks, clear broth, or eat frozen  ice pops or gelatin dessert to help stay hydrated.When you eat, eating slowly may help prevent nausea.There are also some antinausea medicines that may help prevent nausea. HOME CARE INSTRUCTIONS   Take all medicine as directed by your caregiver.  If you do not have an appetite, do not force yourself to eat. However, you must continue to drink fluids.  If you have an appetite, eat a normal diet unless your caregiver tells you differently.  Eat a variety of complex carbohydrates (rice, wheat, potatoes, bread), lean meats, yogurt, fruits, and vegetables.  Avoid high-fat foods because they are more difficult to digest.  Drink enough water and fluids to keep your urine clear or pale yellow.  If you are dehydrated, ask your caregiver for specific rehydration instructions. Signs of dehydration may include:  Severe thirst.  Dry lips and mouth.  Dizziness.  Dark urine.  Decreasing urine frequency and amount.  Confusion.  Rapid breathing or pulse. SEEK IMMEDIATE MEDICAL CARE IF:   You have blood or brown flecks (like coffee grounds) in your vomit.  You have black or bloody stools.  You have a severe headache or stiff neck.  You are confused.  You have severe abdominal pain.  You have chest pain or trouble breathing.  You do not urinate at least once every 8 hours.  You develop cold or clammy skin.  You continue to vomit for longer than 24 to 48 hours.  You have a fever. MAKE SURE YOU:   Understand these instructions.  Will watch your condition.  Will get help right away if you are not doing well or get worse.   This information is not intended to replace advice given to you by your health care provider. Make sure  you discuss any questions you have with your health care provider.   Document Released: 08/05/2005 Document Revised: 10/28/2011 Document Reviewed: 01/02/2011 Elsevier Interactive Patient Education 2016 Elsevier Inc[employer]  Nonspecific Chest Pain It is  often hard to find the cause of chest pain. There is always a chance that your pain could be related to something serious, such as a heart attack or a blood clot in your lungs. Chest pain can also be caused by conditions that are not life-threatening. If you have chest pain, it is very important to follow up with your doctor.  HOME CARE  If you were prescribed an antibiotic medicine, finish it all even if you start to feel better.  Avoid any activities that cause chest pain.  Do not use any tobacco products, including cigarettes, chewing tobacco, or electronic cigarettes. If you need help quitting, ask your doctor.  Do not drink alcohol.  Take medicines only as told by your doctor.  Keep all follow-up visits as told by your doctor. This is important. This includes any further testing if your chest pain does not go away.  Your doctor may tell you to keep your head raised (elevated) while you sleep.  Make lifestyle changes as told by your doctor. These may include:  Getting regular exercise. Ask your doctor to suggest some activities that are safe for you.  Eating a heart-healthy diet. Your doctor or a diet specialist (dietitian) can help you to learn healthy eating options.  Maintaining a healthy weight.  Managing diabetes, if necessary.  Reducing stress. GET HELP IF:  Your chest pain does not go away, even after treatment.  You have a rash with blisters on your chest.  You have a fever. GET HELP RIGHT AWAY IF:  Your chest pain is worse.  You have an increasing cough, or you cough up blood.  You have severe belly (abdominal) pain.  You feel extremely weak.  You pass out (faint).  You have chills.  You have sudden, unexplained chest discomfort.  You have sudden, unexplained discomfort in your arms, back, neck, or jaw.  You have shortness of breath at any time.  You suddenly start to sweat, or your skin gets clammy.  You feel nauseous.  You vomit.  You  suddenly feel light-headed or dizzy.  Your heart begins to beat quickly, or it feels like it is skipping beats. These symptoms may be an emergency. Do not wait to see if the symptoms will go away. Get medical help right away. Call your local emergency services (911 in the [place] Do not drive yourself to the hospital.   This information is not intended to replace advice given to you by your health care provider. Make sure you discuss any questions you have with your health care provider.   Document Released: 01/22/2008 Document Revised: 08/26/2014 Document Reviewed: 03/11/2014 Elsevier Interactive Patient Education [employer].

## 2015-12-28 NOTE — ED Notes (Signed)
Pt here with cp that began after leaving the [place] ER today, states he was discharged yesterday, and went back today for returning symptoms. Also c/o V,D, abd pain, has been unable to begin treatments for his returning mantle cell lymphoma, because of the pneumonia and abd pain that he has recently been diagnosed with, also, enlarged spleen d/t the lymphoma. Pt has not been able to keep fluids down, is very restless in triage, states he is in a lot of pain and is having a hard time catching his breath, slow deep breaths through his nose encouraged. No resp distress at this time.

## 2015-12-28 NOTE — ED Provider Notes (Signed)
[facility] Emergency Department Provider Note        Time seen: ----------------------------------------- 6:50 PM on 12/28/2015 -----------------------------------------    I have reviewed the triage vital signs and the nursing notes.   HISTORY  Chief Complaint Chest Pain; Abdominal Pain; Shortness of Breath; Emesis; and Diarrhea    HPI Alexander [name] is a 60 y.o. male who presents ER for left-sided chest pain that began after leaving the [place]. Patient states he was discharged yesterday, went back today for returning symptoms. His also had vomiting and diarrhea and has been unable to keep anything down. Patient states he was recently diagnosed with Mantle cell carcinoma and that he has an enlarged spleen.   Past Medical History  Diagnosis Date  . COPD (chronic obstructive pulmonary disease) ([place])   . Migraine   . Asthma   . BPH (benign prostatic hyperplasia)   . Deviated nasal septum   . DVT (deep venous thrombosis) ([place])   . Erosive esophagitis   . Mantle cell lymphoma ([place])   . GERD (gastroesophageal reflux disease)     Patient Active Problem List   Diagnosis Date Noted  . Acute encephalopathy 10/02/2015  . COPD (chronic obstructive pulmonary disease) ([place]) 10/02/2015  . GERD (gastroesophageal reflux disease) 10/02/2015  . BPH (benign prostatic hyperplasia) 10/02/2015  . History of DVT (deep vein thrombosis) 10/02/2015  . Orthostatic hypotension 09/28/2015  . Vasovagal syncope 09/28/2015  . Deep vein thrombosis (DVT) of distal vein of lower extremity ([place]) 09/28/2015  . Hypokalemia 09/25/2015  . Gastroenteritis, acute 09/25/2015  . Gastroenteritis 09/25/2015  . Pressure ulcer 08/18/2015  . HCAP (healthcare-associated pneumonia) 08/17/2015  . Syncope and collapse 08/10/2015  . Syncope 08/10/2015  . Protein-calorie malnutrition, severe 08/10/2015  . Abdominal pain 07/17/2015  . Nausea & vomiting 07/17/2015  . Malnutrition of moderate  degree 07/17/2015  . Diarrhea 05/18/2015  . DEVIATED SEPTUM 05/18/2007  . ALLERGIC RHINITIS 05/18/2007  . ASTHMA 05/18/2007    Past Surgical History  Procedure Laterality Date  . Throat surgery    . Barryx N/A   . Esophagogastroduodenoscopy (egd) with propofol N/A 02/23/2015    Procedure: ESOPHAGOGASTRODUODENOSCOPY (EGD) WITH PROPOFOL;  Surgeon: Hulen Luster, MD;  Location[facility] ENDOSCOPY;  Service: Gastroenterology;  Laterality: N/A;  . Esophagogastroduodenoscopy (egd) with propofol N/A 05/04/2015    Procedure: ESOPHAGOGASTRODUODENOSCOPY (EGD) WITH PROPOFOL;  Surgeon: Hulen Luster, MD;  Location: [facility] ENDOSCOPY;  Service: Gastroenterology;  Laterality: N/A;  . Tonsillectomy    . Esophagogastroduodenoscopy (egd) with propofol N/A 07/27/2015    Procedure: ESOPHAGOGASTRODUODENOSCOPY (EGD) WITH PROPOFOL;  Surgeon: Hulen Luster, MD;  Location: [facility] ENDOSCOPY;  Service: Gastroenterology;  Laterality: N/A;  . Esophagogastroduodenoscopy (egd) with propofol N/A 10/05/2015    Procedure: ESOPHAGOGASTRODUODENOSCOPY (EGD) WITH PROPOFOL;  Surgeon: Hulen Luster, MD;  Location: [facility] ENDOSCOPY;  Service: Gastroenterology;  Laterality: N/A;  . Nasal septum surgery    . Upper esophageal endoscopic ultrasound (eus) N/A 10/19/2015    Procedure: UPPER ESOPHAGEAL ENDOSCOPIC ULTRASOUND (EUS);  Surgeon: Holly Bodily, MD;  Location: [facility] ENDOSCOPY;  Service: Gastroenterology;  Laterality: N/A;    Allergies Bee venom and Ativan  Social History Social History  Substance Use Topics  . Smoking status: Former Research scientist (life sciences)  . Smokeless tobacco: None  . Alcohol Use: No    Review of Systems Constitutional: Negative for fever. Eyes: Negative for visual changes. ENT: Negative for sore throat. Cardiovascular: Positive for chest pain Respiratory: Positive shortness of breath Gastrointestinal: Positive for abdominal pain and  vomiting Genitourinary: Negative for dysuria. Musculoskeletal: Negative for back pain. Skin: Negative  for rash. Neurological: Negative for headaches, positive for weakness  10-point ROS otherwise negative.  ____________________________________________   PHYSICAL EXAM:  VITAL SIGNS: ED Triage Vitals  Enc Vitals Group     BP 12/28/15 1734 146/94 mmHg     Pulse Rate 12/28/15 1734 78     Resp 12/28/15 1734 18     Temp 12/28/15 1734 98.2 F (36.8 C)     Temp Source 12/28/15 1734 Oral     SpO2 12/28/15 1734 99 %     Weight 12/28/15 1734 196 lb (88.905 kg)     Height 12/28/15 1734 5\' 10"  (1.778 m)     Head Cir --      Peak Flow --      Pain Score 12/28/15 1735 9     Pain Loc --      Pain Edu? --      Excl. in [place]? --     Constitutional: Alert and oriented. Generally ill appearing, mild distress Eyes: Conjunctivae are normal. PERRL. Normal extraocular movements. ENT   Head: Normocephalic and atraumatic.   Nose: No congestion/rhinnorhea.   Mouth/Throat: Mucous membranes are moist.   Neck: No stridor. Cardiovascular: Normal rate, regular rhythm. No murmurs, rubs, or gallops. Respiratory: Normal respiratory effort without tachypnea nor retractions. Breath sounds are clear and equal bilaterally. No wheezes/rales/rhonchi. Gastrointestinal: Nonfocal tenderness, normal bowel sounds. Musculoskeletal: Nontender with normal range of motion in all extremities. No lower extremity tenderness nor edema. Neurologic:  Normal speech and language. No gross focal neurologic deficits are appreciated.  Skin:  Skin is warm, dry and intact. No rash noted. Psychiatric: Mood and affect are normal. Speech and behavior are normal.  ____________________________________________  EKG: Interpreted by me. Normal sinus rhythm with a rate of 80 bpm, normal PR interval, normal QRS, normal QT interval. Normal axis. Nonspecific T-wave changes  ____________________________________________  ED COURSE:  Pertinent labs & imaging results that were available during my care of the patient were reviewed by  me and considered in my medical decision making (see chart for details). Patient presents with chest pain, shortness of breath, abdominal pain and vomiting. I will check basic labs and reevaluate. ____________________________________________    LABS (pertinent positives/negatives)  Labs Reviewed  CBC - Abnormal; Notable for the following:    WBC 13.8 (*)    All other components within normal limits  BASIC METABOLIC PANEL  TROPONIN I    RADIOLOGY Images were viewed by me  Chest x-ray IMPRESSION: No active cardiopulmonary disease.  ____________________________________________  FINAL ASSESSMENT AND PLAN  Chest pain, shortness of breath, abdominal pain, vomiting  Plan: Patient with labs and imaging as dictated above. Patient has a long history of GERD and GI upset with vomiting. Patient did improve significantly with Valium and there may be an anxiety component to his symptoms. He has Phenergan at home, I will prescribe a short supply of Valium areas he is stable for discharge and feels better. Earleen Newport, MD   Note: This dictation was prepared with Dragon dictation. Any transcriptional errors that result from this process are unintentional   Earleen Newport, MD 12/28/15 2140

## 2016-01-04 ENCOUNTER — Encounter
Admission: RE | Disposition: A | Discharge status: Home / Self Care | Payer: Self-pay | Source: Ambulatory Visit | Attending: Gastroenterology

## 2016-01-04 ENCOUNTER — Ambulatory Visit
Admission: RE | Admit: 2016-01-04 | Discharge: 2016-01-04 | Disposition: A | Discharge status: Home / Self Care | Payer: Medicaid Other | Source: Ambulatory Visit | Attending: Gastroenterology | Admitting: Gastroenterology

## 2016-01-04 ENCOUNTER — Ambulatory Visit: Payer: Medicaid Other | Admitting: Anesthesiology

## 2016-01-04 ENCOUNTER — Encounter: Payer: Self-pay | Admitting: *Deleted

## 2016-01-04 DIAGNOSIS — Z91030 Bee allergy status: Secondary | ICD-10-CM | POA: Diagnosis not present

## 2016-01-04 DIAGNOSIS — Z79899 Other long term (current) drug therapy: Secondary | ICD-10-CM | POA: Diagnosis not present

## 2016-01-04 DIAGNOSIS — G43909 Migraine, unspecified, not intractable, without status migrainosus: Secondary | ICD-10-CM | POA: Insufficient documentation

## 2016-01-04 DIAGNOSIS — Z86718 Personal history of other venous thrombosis and embolism: Secondary | ICD-10-CM | POA: Diagnosis not present

## 2016-01-04 DIAGNOSIS — Z7901 Long term (current) use of anticoagulants: Secondary | ICD-10-CM | POA: Insufficient documentation

## 2016-01-04 DIAGNOSIS — F419 Anxiety disorder, unspecified: Secondary | ICD-10-CM | POA: Diagnosis not present

## 2016-01-04 DIAGNOSIS — K5900 Constipation, unspecified: Secondary | ICD-10-CM | POA: Diagnosis not present

## 2016-01-04 DIAGNOSIS — Z888 Allergy status to other drugs, medicaments and biological substances status: Secondary | ICD-10-CM | POA: Insufficient documentation

## 2016-01-04 DIAGNOSIS — J449 Chronic obstructive pulmonary disease, unspecified: Secondary | ICD-10-CM | POA: Insufficient documentation

## 2016-01-04 DIAGNOSIS — K219 Gastro-esophageal reflux disease without esophagitis: Secondary | ICD-10-CM | POA: Insufficient documentation

## 2016-01-04 DIAGNOSIS — N400 Enlarged prostate without lower urinary tract symptoms: Secondary | ICD-10-CM | POA: Insufficient documentation

## 2016-01-04 DIAGNOSIS — K2270 Barrett's esophagus without dysplasia: Secondary | ICD-10-CM | POA: Insufficient documentation

## 2016-01-04 DIAGNOSIS — Z8572 Personal history of non-Hodgkin lymphomas: Secondary | ICD-10-CM | POA: Insufficient documentation

## 2016-01-04 DIAGNOSIS — I739 Peripheral vascular disease, unspecified: Secondary | ICD-10-CM | POA: Diagnosis not present

## 2016-01-04 DIAGNOSIS — Z87891 Personal history of nicotine dependence: Secondary | ICD-10-CM | POA: Diagnosis not present

## 2016-01-04 HISTORY — PX: ESOPHAGOGASTRODUODENOSCOPY (EGD) WITH PROPOFOL: SHX5813

## 2016-01-04 SURGERY — ESOPHAGOGASTRODUODENOSCOPY (EGD) WITH PROPOFOL
Anesthesia: General

## 2016-01-04 MED ORDER — SODIUM CHLORIDE 0.9 % IV SOLN
INTRAVENOUS | Status: DC
  Administered 2016-01-04: 09:00:00 via INTRAVENOUS

## 2016-01-04 MED ORDER — IPRATROPIUM-ALBUTEROL 0.5-2.5 (3) MG/3ML IN SOLN
RESPIRATORY_TRACT | Status: AC
  Administered 2016-01-04: 3 mL
  Filled 2016-01-04: qty 3

## 2016-01-04 MED ORDER — SODIUM CHLORIDE 0.9 % IV SOLN
INTRAVENOUS | Status: DC
  Administered 2016-01-04: 08:00:00 via INTRAVENOUS

## 2016-01-04 MED ORDER — STERILE WATER FOR INJECTION IJ SOLN
INTRAMUSCULAR | Status: DC | PRN
  Administered 2016-01-04: 60 mL via OROMUCOSAL

## 2016-01-04 MED ORDER — PROPOFOL 500 MG/50ML IV EMUL
INTRAVENOUS | Status: DC | PRN
  Administered 2016-01-04: 180 ug/kg/min via INTRAVENOUS

## 2016-01-04 MED ORDER — LIDOCAINE 2% (20 MG/ML) 5 ML SYRINGE
INTRAMUSCULAR | Status: DC | PRN
Start: 2016-01-04 — End: 2016-01-04
  Administered 2016-01-04: 40 mg via INTRAVENOUS

## 2016-01-04 MED ORDER — STERILE WATER FOR INJECTION IJ SOLN
Freq: Once | RESPIRATORY_TRACT | Status: DC
  Filled 2016-01-04: qty 3

## 2016-01-04 MED ORDER — PROPOFOL 500 MG/50ML IV EMUL: INTRAVENOUS | Status: DC | PRN

## 2016-01-04 MED ORDER — FENTANYL CITRATE (PF) 100 MCG/2ML IJ SOLN: 25.0000 ug | INTRAMUSCULAR | Status: DC | PRN

## 2016-01-04 MED ORDER — PROPOFOL 10 MG/ML IV BOLUS
INTRAVENOUS | Status: DC | PRN
  Administered 2016-01-04: 100 mg via INTRAVENOUS

## 2016-01-04 MED ORDER — MIDAZOLAM HCL 5 MG/5ML IJ SOLN
INTRAMUSCULAR | Status: DC | PRN
  Administered 2016-01-04: 1 mg via INTRAVENOUS

## 2016-01-04 MED ORDER — ONDANSETRON HCL 4 MG/2ML IJ SOLN: 4.0000 mg | Freq: Once | INTRAMUSCULAR | Status: DC | PRN

## 2016-01-04 MED ORDER — FENTANYL CITRATE (PF) 100 MCG/2ML IJ SOLN
INTRAMUSCULAR | Status: DC | PRN
  Administered 2016-01-04: 50 ug via INTRAVENOUS

## 2016-01-04 NOTE — Anesthesia Postprocedure Evaluation (Signed)
Anesthesia Post Note  Patient: Alexander [name]  Procedure(s) Performed: Procedure(s) (LRB): ESOPHAGOGASTRODUODENOSCOPY (EGD) WITH PROPOFOL (N/A)  Patient location during evaluation: Endoscopy Anesthesia Type: General Level of consciousness: awake and alert Pain management: pain level controlled Vital Signs Assessment: post-procedure vital signs reviewed and stable Respiratory status: spontaneous breathing, nonlabored ventilation, respiratory function stable and patient connected to nasal cannula oxygen Cardiovascular status: blood pressure returned to baseline and stable Postop Assessment: no signs of nausea or vomiting Anesthetic complications: no    Last Vitals:  Filed Vitals:   01/04/16 0930 01/04/16 0940  BP: 116/60 116/97  Pulse: 77 87  Temp:    Resp: 7 20    Last Pain: There were no vitals filed for this visit.               Precious Haws Piscitello

## 2016-01-04 NOTE — Transfer of Care (Signed)
Immediate Anesthesia Transfer of Care Note  Patient: Alexander [name]  Procedure(s) Performed: Procedure(s): ESOPHAGOGASTRODUODENOSCOPY (EGD) WITH PROPOFOL (N/A)  Patient Location: PACU and Endoscopy Unit  Anesthesia Type:General  Level of Consciousness: sedated  Airway & Oxygen Therapy: Patient Spontanous Breathing and Patient connected to nasal cannula oxygen  Post-op Assessment: Report given to RN and Post -op Vital signs reviewed and stable  Post vital signs: Reviewed and stable  Last Vitals:  Filed Vitals:   01/04/16 0808  BP: 115/82  Pulse: 92  Temp: 35.7 C  Resp: 18    Last Pain: There were no vitals filed for this visit.       Complications: No apparent anesthesia complications

## 2016-01-04 NOTE — H&P (Signed)
Primary Care Physician:  Perrin Maltese, MD Primary Gastroenterologist:  Dr. Candace Cruise  Pre-Procedure History & Physical: HPI:  Alexander [name] is a 60 y.o. male is here for an EGD  Past Medical History  Diagnosis Date  . COPD (chronic obstructive pulmonary disease) ([place])   . Migraine   . Asthma   . BPH (benign prostatic hyperplasia)   . Deviated nasal septum   . DVT (deep venous thrombosis) ([place])   . Erosive esophagitis   . Mantle cell lymphoma ([place])   . GERD (gastroesophageal reflux disease)     Past Surgical History  Procedure Laterality Date  . Throat surgery    . Barryx N/A   . Esophagogastroduodenoscopy (egd) with propofol N/A 02/23/2015    Procedure: ESOPHAGOGASTRODUODENOSCOPY (EGD) WITH PROPOFOL;  Surgeon: Hulen Luster, MD;  Location: [facility] ENDOSCOPY;  Service: Gastroenterology;  Laterality: N/A;  . Esophagogastroduodenoscopy (egd) with propofol N/A 05/04/2015    Procedure: ESOPHAGOGASTRODUODENOSCOPY (EGD) WITH PROPOFOL;  Surgeon: Hulen Luster, MD;  Location: [facility] ENDOSCOPY;  Service: Gastroenterology;  Laterality: N/A;  . Tonsillectomy    . Esophagogastroduodenoscopy (egd) with propofol N/A 07/27/2015    Procedure: ESOPHAGOGASTRODUODENOSCOPY (EGD) WITH PROPOFOL;  Surgeon: Hulen Luster, MD;  Location: [facility] ENDOSCOPY;  Service: Gastroenterology;  Laterality: N/A;  . Esophagogastroduodenoscopy (egd) with propofol N/A 10/05/2015    Procedure: ESOPHAGOGASTRODUODENOSCOPY (EGD) WITH PROPOFOL;  Surgeon: Hulen Luster, MD;  Location: [facility] ENDOSCOPY;  Service: Gastroenterology;  Laterality: N/A;  . Nasal septum surgery    . Upper esophageal endoscopic ultrasound (eus) N/A 10/19/2015    Procedure: UPPER ESOPHAGEAL ENDOSCOPIC ULTRASOUND (EUS);  Surgeon: Holly Bodily, MD;  Location: [facility] ENDOSCOPY;  Service: Gastroenterology;  Laterality: N/A;    Prior to Admission medications   Medication Sig Start Date End Date Taking? Authorizing Provider  albuterol (PROAIR HFA) 108 (90 Base) MCG/ACT inhaler  Inhale 1-2 puffs into the lungs every 4 (four) hours as needed for wheezing or shortness of breath.     Historical Provider, MD  cyanocobalamin 500 MCG tablet Take 500 mcg by mouth daily.    Historical Provider, MD  diazepam (VALIUM) 5 MG tablet Take 1 tablet (5 mg total) by mouth every 8 (eight) hours as needed for anxiety. 12/28/15   Earleen Newport, MD  dicyclomine (BENTYL) 20 MG tablet Take 20 mg by mouth 4 (four) times daily -  before meals and at bedtime.    Historical Provider, MD  docusate sodium (COLACE) 100 MG capsule Take 200 mg by mouth 2 (two) times daily as needed for mild constipation.    Historical Provider, MD  fluticasone (FLONASE) 50 MCG/ACT nasal spray Place 2 sprays into both nostrils daily. 10/04/15   Nicholes Mango, MD  loperamide (IMODIUM) 2 MG capsule Take 2 mg by mouth as needed for diarrhea or loose stools.    Historical Provider, MD  midodrine (PROAMATINE) 2.5 MG tablet Take 2.5 mg by mouth 2 (two) times daily with a meal.    Historical Provider, MD  morphine (MSIR) 30 MG tablet Take 30 mg by mouth 3 (three) times daily.    Historical Provider, MD  omeprazole (PRILOSEC) 20 MG capsule Take 20 mg by mouth 2 (two) times daily.    Historical Provider, MD  potassium chloride (K-DUR) 10 MEQ tablet Take 1 tablet (10 mEq total) by mouth 2 (two) times daily. 09/30/15   Fredia Sorrow, MD  promethazine (PHENERGAN) 25 MG tablet Take 1 tablet (25 mg total) by mouth every 6 (six) hours  as needed for nausea or vomiting. 09/30/15   Fredia Sorrow, MD  rivaroxaban (XARELTO) 20 MG TABS tablet Take 20 mg by mouth daily with supper.    Historical Provider, MD  senna (SENOKOT) 8.6 MG TABS tablet Take 2 tablets by mouth 2 (two) times daily as needed for mild constipation.    Historical Provider, MD  SUMAtriptan (IMITREX) 50 MG tablet Take 50 mg by mouth every 2 (two) hours as needed for migraine.     Historical Provider, MD  tamsulosin (FLOMAX) 0.4 MG CAPS capsule Take 0.4 mg by mouth at  bedtime.    Historical Provider, MD  traZODone (DESYREL) 50 MG tablet Take 50 mg by mouth at bedtime.    Historical Provider, MD    Allergies as of 12/26/2015 - Review Complete 10/19/2015  Allergen Reaction Noted  . Bee venom Shortness Of Breath and Swelling 04/25/2015  . Ativan [lorazepam] Other (See Comments) 01/12/2015    Family History  Problem Relation Age of Onset  . Mesothelioma Father   . Breast cancer Mother     Social History   Social History  . Marital Status: Married    Spouse Name: N/A  . Number of Children: N/A  . Years of Education: N/A   Occupational History  . retired    Social History Main Topics  . Smoking status: Former Research scientist (life sciences)  . Smokeless tobacco: Not on file  . Alcohol Use: No  . Drug Use: No  . Sexual Activity: Yes   Other Topics Concern  . Not on file   Social History Narrative   Lives with wife and son at home.    Review of Systems: See HPI, otherwise negative ROS  Physical Exam: There were no vitals taken for this visit. General:   Alert,  pleasant and cooperative in NAD Head:  Normocephalic and atraumatic. Neck:  Supple; no masses or thyromegaly. Lungs:  Clear throughout to auscultation.    Heart:  Regular rate and rhythm. Abdomen:  Soft, nontender and nondistended. Normal bowel sounds, without guarding, and without rebound.   Neurologic:  Alert and  oriented x4;  grossly normal neurologically.  Impression/Plan: Alexander [name] is here for an EGD to be performed for Hx of Barrett's and dysplasia Risks, benefits, limitations, and alternatives regarding  EGD have been reviewed with the patient.  Questions have been answered.  All parties agreeable.   [name], Lupita Dawn, MD  01/04/2016, 8:01 AM

## 2016-01-04 NOTE — Anesthesia Preprocedure Evaluation (Signed)
Anesthesia Evaluation  Patient identified by MRN, date of birth, ID band Patient awake    Reviewed: Allergy & Precautions, H&P , NPO status , Patient's Chart, lab work & pertinent test results  History of Anesthesia Complications Negative for: history of anesthetic complications  Airway Mallampati: III  TM Distance: <3 FB Neck ROM: limited    Dental  (+) Poor Dentition, Edentulous Upper, Edentulous Lower, Missing   Pulmonary shortness of breath and with exertion, asthma , pneumonia, resolved, COPD,  COPD inhaler, former smoker,    Pulmonary exam normal breath sounds clear to auscultation       Cardiovascular Exercise Tolerance: Good (-) angina+ Peripheral Vascular Disease  (-) Past MI Normal cardiovascular exam Rhythm:regular Rate:Normal     Neuro/Psych  Headaches, PSYCHIATRIC DISORDERS    GI/Hepatic Neg liver ROS, PUD, GERD  Medicated and Controlled,  Endo/Other  negative endocrine ROS  Renal/GU negative Renal ROS  negative genitourinary   Musculoskeletal   Abdominal   Peds  Hematology negative hematology ROS (+)   Anesthesia Other Findings Past Medical History:   COPD (chronic obstructive pulmonary disease) (*              Migraine                                                     Asthma                                                       BPH (benign prostatic hyperplasia)                           Deviated nasal septum                                        DVT (deep venous thrombosis) (H[facility])                           Erosive esophagitis                                          Mantle cell lymphoma (H[facility])                                   GERD (gastroesophageal reflux disease)                      Past Surgical History:   THROAT SURGERY                                                barryx  N/A              ESOPHAGOGASTRODUODENOSCOPY (EGD) WITH PROPOFOL  N/A  02/23/2015       Comment:Procedure: ESOPHAGOGASTRODUODENOSCOPY (EGD)               WITH PROPOFOL;  Surgeon: Hulen Luster, MD;                Location: [facility] ENDOSCOPY;  Service:               Gastroenterology;  Laterality: N/A;   ESOPHAGOGASTRODUODENOSCOPY (EGD) WITH PROPOFOL  N/A 05/04/2015      Comment:Procedure: ESOPHAGOGASTRODUODENOSCOPY (EGD)               WITH PROPOFOL;  Surgeon: Hulen Luster, MD;                Location: [facility] ENDOSCOPY;  Service:               Gastroenterology;  Laterality: N/A;   TONSILLECTOMY                                                 ESOPHAGOGASTRODUODENOSCOPY (EGD) WITH PROPOFOL  N/A 07/27/2015      Comment:Procedure: ESOPHAGOGASTRODUODENOSCOPY (EGD)               WITH PROPOFOL;  Surgeon: Hulen Luster, MD;                Location: [facility] ENDOSCOPY;  Service:               Gastroenterology;  Laterality: N/A;   ESOPHAGOGASTRODUODENOSCOPY (EGD) WITH PROPOFOL  N/A 10/05/2015      Comment:Procedure: ESOPHAGOGASTRODUODENOSCOPY (EGD)               WITH PROPOFOL;  Surgeon: Hulen Luster, MD;                Location: [facility] ENDOSCOPY;  Service:               Gastroenterology;  Laterality: N/A;   NASAL SEPTUM SURGERY                                          UPPER ESOPHAGEAL ENDOSCOPIC ULTRASOUND (EUS)    N/A 10/19/2015       Comment:Procedure: UPPER ESOPHAGEAL ENDOSCOPIC               ULTRASOUND (EUS);  Surgeon: Holly Bodily, MD;  Location: [facility] ENDOSCOPY;                Service: Gastroenterology;  Laterality: N/A;  BMI    Body Mass Index   26.40 kg/m 2      Reproductive/Obstetrics negative OB ROS                             Anesthesia Physical Anesthesia Plan  ASA: III  Anesthesia Plan: General   Post-op Pain Management:    Induction:   Airway Management Planned:   Additional Equipment:   Intra-op Plan:   Post-operative Plan:   Informed Consent: I have reviewed the patients History and Physical, chart, labs and  discussed the procedure including the risks, benefits and alternatives for the proposed anesthesia with the patient or authorized representative who has indicated his/her understanding and acceptance.   Dental Advisory Given  Plan Discussed with: Anesthesiologist, CRNA and Surgeon  Anesthesia Plan Comments:         Anesthesia Quick Evaluation

## 2016-01-04 NOTE — Op Note (Signed)
[facility] Gastroenterology Patient Name: Alexander [name] Procedure Date: 01/04/2016 8:32 AM MRN: [id number] Account #: [id number] Date of Birth: 1955-10-17 Admit Type: Outpatient Age: 60 Room: [facility] ENDO ROOM 4 Gender: Male Note Status: Finalized Procedure:            Upper GI endoscopy Indications:          For therapy of Barrett's esophagus, Hx of lymphoma. 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 Hx of                        significant esophagitis in the past.s/p Barrx x 5. Providers:            Lupita Dawn. Candace Cruise, MD Referring MD:         Perrin Maltese, MD (Referring MD) Medicines:            Monitored Anesthesia Care Complications:        No immediate complications. Procedure:            Pre-Anesthesia Assessment:                       - Prior to the procedure, a History and Physical was                        performed, and patient medications, allergies and                        sensitivities were reviewed. The patient's tolerance of                        previous anesthesia was reviewed.                       - The risks and benefits of the procedure and the                        sedation options and risks were discussed with the                        patient. All questions were answered and informed                        consent was obtained.                       - After reviewing the risks and benefits, the patient                        was deemed in satisfactory condition to undergo the                        procedure.                       After obtaining informed consent, the endoscope was                        passed under direct vision. Throughout the procedure,  the patient's blood pressure, pulse, and oxygen                        saturations were monitored continuously. The Endoscope                        was introduced through the mouth, and advanced to the                        second  part of duodenum. The upper GI endoscopy was                        accomplished without difficulty. The patient tolerated                        the procedure well. Findings:      The esophagus and gastroesophageal junction were examined with white       light and narrow band imaging (NBI) from a forward view and retroflexed       position. There were esophageal mucosal changes consistent with       short-segment Barrett's esophagus. These changes involved the mucosa at       the upper extent of the gastric folds (38 cm from the incisors)       extending to the Z-line (36 cm from the incisors). Circumferential       salmon-colored mucosa was present from 36 to 38 cm. The maximum       longitudinal extent of these esophageal mucosal changes was 2 cm in       length. No acute inflammation seen. Much improved from before. Focal       radiofrequency ablation of Barrett's esophagus was performed. With the       endoscope in place, the position and extent of the Barrett's mucosa and       the anatomic landmarks including proximal and distal extent of Barrett's       mucosa were noted. Endoscopic visualization identified an ablation site       including the entire visible Barrett's segment. The Barrett's mucosa was       irrigated with N-acetylcysteine (Mucomyst) 1% mixed with water.       Esophageal contents were suctioned. The endoscope was then removed from       the patient. The Barrx-90 radiofrequency ablation catheter was attached       to the tip of the endoscope. The endoscope with the attached       radiofrequency ablation catheter was then passed transorally under       direct vision into the esophagus and advanced to the areas of Barrett's       mucosa. The areas included islands and circumferential areas of       Barrett's mucosa. The radiofrequency ablation catheter was placed in       contact with the surface of the Barrett's mucosa under direct       visualization and energy was  applied twice at 12 J/cm2. Ablation was       repeated in a likewise fashion to the entire area of suspected Barrett's       mucosa. The ablation zone was cleaned of coagulative debris. The       ablation catheter and endoscope were then removed and the catheter was       cleaned. The catheter  and endoscope were reinserted into the esophagus.       A second round of ablation was then performed. Energy was applied twice       at 12 J/cm2 to retreat the areas of Barrett's epithelium that had been       treated with the first series of ablation. The areas of the esophagus       where Barrett's mucosa had been ablated were examined. Areas of visible       Barrett's esophagus were completely ablated. Total of 20 treatments given      The exam was otherwise without abnormality.      A large amount of food (residue) was found in the gastric fundus.      The exam was otherwise without abnormality.      Food (residue) was found in the duodenal bulb.      The exam was otherwise without abnormality. Impression:           - Esophageal mucosal changes consistent with                        short-segment Barrett's esophagus. Treated with                        radiofrequency ablation.                       - The examination was otherwise normal.                       - A large amount of food (residue) in the stomach.                       - The examination was otherwise normal.                       - Retained food in the duodenum.                       - The examination was otherwise normal.                       - No specimens collected. Recommendation:       - Discharge patient to home.                       - Observe patient's clinical course.                       - Continue present medications.                       - The findings and recommendations were discussed with                        the patient.                       - Tylenol with codeine elixir and GI cocktail                         prescribed. Procedure Code(s):    --- Professional ---  43270, Esophagogastroduodenoscopy, flexible, transoral;                        with ablation of tumor(s), polyp(s), or other lesion(s)                        (includes pre- and post-dilation and guide wire                        passage, when performed) Diagnosis Code(s):    --- Professional ---                       K22.70, Barrett's esophagus without dysplasia CPT copyright 2016 American Medical Association. All rights reserved. The codes documented in this report are preliminary and upon coder review may  be revised to meet current compliance requirements. Hulen Luster, MD 01/04/2016 9:10:39 AM This report has been signed electronically. Number of Addenda: 0 Note Initiated On: 01/04/2016 8:32 AM      [facility]

## 2016-01-09 ENCOUNTER — Encounter: Payer: Self-pay | Admitting: Gastroenterology

## 2016-04-01 ENCOUNTER — Emergency Department
Admission: EM | Admit: 2016-04-01 | Discharge: 2016-04-02 | Disposition: A | Discharge status: Home / Self Care | Payer: Medicaid Other | Attending: Emergency Medicine | Admitting: Emergency Medicine

## 2016-04-01 ENCOUNTER — Other Ambulatory Visit: Payer: Self-pay | Admitting: Internal Medicine

## 2016-04-01 ENCOUNTER — Emergency Department: Payer: Medicaid Other

## 2016-04-01 ENCOUNTER — Encounter: Payer: Self-pay | Admitting: *Deleted

## 2016-04-01 DIAGNOSIS — R0602 Shortness of breath: Secondary | ICD-10-CM | POA: Diagnosis present

## 2016-04-01 DIAGNOSIS — J45909 Unspecified asthma, uncomplicated: Secondary | ICD-10-CM | POA: Diagnosis not present

## 2016-04-01 DIAGNOSIS — Z87891 Personal history of nicotine dependence: Secondary | ICD-10-CM | POA: Insufficient documentation

## 2016-04-01 DIAGNOSIS — J441 Chronic obstructive pulmonary disease with (acute) exacerbation: Secondary | ICD-10-CM | POA: Diagnosis not present

## 2016-04-01 DIAGNOSIS — R1310 Dysphagia, unspecified: Secondary | ICD-10-CM

## 2016-04-01 LAB — BASIC METABOLIC PANEL
ANION GAP: 8 (ref 5–15)
BUN: 10 mg/dL (ref 6–20)
CO2: 27 mmol/L (ref 22–32)
Calcium: 8.7 mg/dL — ABNORMAL LOW (ref 8.9–10.3)
Chloride: 103 mmol/L (ref 101–111)
Creatinine, Ser: 0.61 mg/dL (ref 0.61–1.24)
GFR calc Af Amer: >60 mL/min (ref >60)
GLUCOSE: 115 mg/dL — AB (ref 65–99)
POTASSIUM: 3.6 mmol/L (ref 3.5–5.1)
Sodium: 138 mmol/L (ref 135–145)

## 2016-04-01 LAB — CBC
HEMATOCRIT: 36 % — AB (ref 40.0–52.0)
HEMOGLOBIN: 12.3 g/dL — AB (ref 13.0–18.0)
MCH: 28.7 pg (ref 26.0–34.0)
MCHC: 34.3 g/dL (ref 32.0–36.0)
MCV: 83.7 fL (ref 80.0–100.0)
Platelets: 160 10*3/uL (ref 150–440)
RBC: 4.3 MIL/uL — AB (ref 4.40–5.90)
RDW: 14.8 % — ABNORMAL HIGH (ref 11.5–14.5)
WBC: 14.1 10*3/uL — ABNORMAL HIGH (ref 3.8–10.6)

## 2016-04-01 LAB — TROPONIN I: Troponin I: <0.03 ng/mL (ref <0.03)

## 2016-04-01 MED ORDER — IPRATROPIUM-ALBUTEROL 0.5-2.5 (3) MG/3ML IN SOLN
3.0000 mL | Freq: Once | RESPIRATORY_TRACT | Status: AC
  Administered 2016-04-01: 3 mL via RESPIRATORY_TRACT
  Filled 2016-04-01: qty 3

## 2016-04-01 MED ORDER — METHYLPREDNISOLONE SODIUM SUCC 125 MG IJ SOLR
125.0000 mg | Freq: Once | INTRAMUSCULAR | Status: AC
  Administered 2016-04-01: 125 mg via INTRAVENOUS
  Filled 2016-04-01: qty 2

## 2016-04-01 NOTE — ED Triage Notes (Signed)
Pt c/o increasing shortness of breath and chest pressure over past 3 days. Pt c/o non-radiating chest pressure increased w/ inspiration and palpation in central chest. Pt has hx of lung disease and is CA pt at [place]. Pt c/o associated sxs w/ the chest pressure. Pt did not drive self to ED today.

## 2016-04-01 NOTE — ED Notes (Signed)
Pt taken off 2L nasal cannula per ED MD; pt 93% on room air.

## 2016-04-01 NOTE — ED Notes (Signed)
Pt in via triage on 2L nasal cannula.  Pt w/ complaints of increasing shortness of breath and wheezing over the last 3 days.  Pt reports recurring lymphoma with PET scan scheduled 8/25 at the [facility]; not currently receiving any treatments.  Pt with labored breathing at rest, vitals WDL.  Pt A/Ox4, no immediate distress at this time.

## 2016-04-01 NOTE — ED Provider Notes (Signed)
[facility] Emergency Department Provider Note  Time seen: 10:09 PM  I have reviewed the triage vital signs and the nursing notes.   HISTORY  Chief Complaint Shortness of Breath and Chest Pain    HPI Alexander [name] is a 60 y.o. male with a past medical history of mild COPD, gastric reflux, Mantle cell lymphoma, who presents the emergency department with 3 days of shortness breath. According to the patient for the past 1-2 months he has felt progressively more short of breath however he states acute worsening of the past 3 days. Denies any chest pain or pressure. States the shortness of breath is worse with exertion. Patient does not use oxygen at home. States he was diagnosed with mild COPD, does not smoke cigarettes. Denies any leg pain or swelling.  Past Medical History:  Diagnosis Date  . Asthma   . BPH (benign prostatic hyperplasia)   . COPD (chronic obstructive pulmonary disease) ([place])   . Deviated nasal septum   . DVT (deep venous thrombosis) ([place])   . Erosive esophagitis   . GERD (gastroesophageal reflux disease)   . Mantle cell lymphoma ([place])   . Migraine     Patient Active Problem List   Diagnosis Date Noted  . Acute encephalopathy 10/02/2015  . COPD (chronic obstructive pulmonary disease) ([place]) 10/02/2015  . GERD (gastroesophageal reflux disease) 10/02/2015  . BPH (benign prostatic hyperplasia) 10/02/2015  . History of DVT (deep vein thrombosis) 10/02/2015  . Orthostatic hypotension 09/28/2015  . Vasovagal syncope 09/28/2015  . Deep vein thrombosis (DVT) of distal vein of lower extremity ([place]) 09/28/2015  . Hypokalemia 09/25/2015  . Gastroenteritis, acute 09/25/2015  . Gastroenteritis 09/25/2015  . Pressure ulcer 08/18/2015  . HCAP (healthcare-associated pneumonia) 08/17/2015  . Syncope and collapse 08/10/2015  . Syncope 08/10/2015  . Protein-calorie malnutrition, severe 08/10/2015  . Abdominal pain 07/17/2015  . Nausea & vomiting  07/17/2015  . Malnutrition of moderate degree 07/17/2015  . Diarrhea 05/18/2015  . DEVIATED SEPTUM 05/18/2007  . ALLERGIC RHINITIS 05/18/2007  . ASTHMA 05/18/2007    Past Surgical History:  Procedure Laterality Date  . barryx N/A   . ESOPHAGOGASTRODUODENOSCOPY (EGD) WITH PROPOFOL N/A 02/23/2015   Procedure: ESOPHAGOGASTRODUODENOSCOPY (EGD) WITH PROPOFOL;  Surgeon: Hulen Luster, MD;  Location: [facility] ENDOSCOPY;  Service: Gastroenterology;  Laterality: N/A;  . ESOPHAGOGASTRODUODENOSCOPY (EGD) WITH PROPOFOL N/A 05/04/2015   Procedure: ESOPHAGOGASTRODUODENOSCOPY (EGD) WITH PROPOFOL;  Surgeon: Hulen Luster, MD;  Location: [facility] ENDOSCOPY;  Service: Gastroenterology;  Laterality: N/A;  . ESOPHAGOGASTRODUODENOSCOPY (EGD) WITH PROPOFOL N/A 07/27/2015   Procedure: ESOPHAGOGASTRODUODENOSCOPY (EGD) WITH PROPOFOL;  Surgeon: Hulen Luster, MD;  Location[facility] ENDOSCOPY;  Service: Gastroenterology;  Laterality: N/A;  . ESOPHAGOGASTRODUODENOSCOPY (EGD) WITH PROPOFOL N/A 10/05/2015   Procedure: ESOPHAGOGASTRODUODENOSCOPY (EGD) WITH PROPOFOL;  Surgeon: Hulen Luster, MD;  Location: [facility] ENDOSCOPY;  Service: Gastroenterology;  Laterality: N/A;  . ESOPHAGOGASTRODUODENOSCOPY (EGD) WITH PROPOFOL N/A 01/04/2016   Procedure: ESOPHAGOGASTRODUODENOSCOPY (EGD) WITH PROPOFOL;  Surgeon: Hulen Luster, MD;  Location: [facility] ENDOSCOPY;  Service: Gastroenterology;  Laterality: N/A;  . NASAL SEPTUM SURGERY    . THROAT SURGERY    . TONSILLECTOMY    . UPPER ESOPHAGEAL ENDOSCOPIC ULTRASOUND (EUS) N/A 10/19/2015   Procedure: UPPER ESOPHAGEAL ENDOSCOPIC ULTRASOUND (EUS);  Surgeon: Holly Bodily, MD;  Location: [facility] ENDOSCOPY;  Service: Gastroenterology;  Laterality: N/A;    Prior to Admission medications   Medication Sig Start Date End Date Taking? Authorizing Provider  albuterol (PROAIR HFA) 108 (90 Base) MCG/ACT  inhaler Inhale 1-2 puffs into the lungs every 4 (four) hours as needed for wheezing or shortness of breath.     Historical Provider, MD   cyanocobalamin 500 MCG tablet Take 500 mcg by mouth daily.    Historical Provider, MD  diazepam (VALIUM) 5 MG tablet Take 1 tablet (5 mg total) by mouth every 8 (eight) hours as needed for anxiety. 12/28/15   Earleen Newport, MD  dicyclomine (BENTYL) 20 MG tablet Take 20 mg by mouth 4 (four) times daily -  before meals and at bedtime.    Historical Provider, MD  docusate sodium (COLACE) 100 MG capsule Take 200 mg by mouth 2 (two) times daily as needed for mild constipation.    Historical Provider, MD  fluticasone (FLONASE) 50 MCG/ACT nasal spray Place 2 sprays into both nostrils daily. 10/04/15   Nicholes Mango, MD  loperamide (IMODIUM) 2 MG capsule Take 2 mg by mouth as needed for diarrhea or loose stools.    Historical Provider, MD  midodrine (PROAMATINE) 2.5 MG tablet Take 2.5 mg by mouth 2 (two) times daily with a meal.    Historical Provider, MD  morphine (MSIR) 30 MG tablet Take 30 mg by mouth 3 (three) times daily.    Historical Provider, MD  omeprazole (PRILOSEC) 20 MG capsule Take 20 mg by mouth 2 (two) times daily.    Historical Provider, MD  potassium chloride (K-DUR) 10 MEQ tablet Take 1 tablet (10 mEq total) by mouth 2 (two) times daily. 09/30/15   Fredia Sorrow, MD  promethazine (PHENERGAN) 25 MG tablet Take 1 tablet (25 mg total) by mouth every 6 (six) hours as needed for nausea or vomiting. 09/30/15   Fredia Sorrow, MD  rivaroxaban (XARELTO) 20 MG TABS tablet Take 20 mg by mouth daily with supper.    Historical Provider, MD  senna (SENOKOT) 8.6 MG TABS tablet Take 2 tablets by mouth 2 (two) times daily as needed for mild constipation.    Historical Provider, MD  SUMAtriptan (IMITREX) 50 MG tablet Take 50 mg by mouth every 2 (two) hours as needed for migraine.     Historical Provider, MD  tamsulosin (FLOMAX) 0.4 MG CAPS capsule Take 0.4 mg by mouth at bedtime.    Historical Provider, MD  traZODone (DESYREL) 50 MG tablet Take 50 mg by mouth at bedtime.    Historical Provider, MD     Allergies  Allergen Reactions  . Bee Venom Shortness Of Breath and Swelling  . Ativan [Lorazepam] Other (See Comments)    Reaction:  Dizziness     Family History  Problem Relation Age of Onset  . Mesothelioma Father   . Breast cancer Mother     Social History Social History  Substance Use Topics  . Smoking status: Former Research scientist (life sciences)  . Smokeless tobacco: Never Used  . Alcohol use No    Review of Systems Constitutional: Negative for fever. Cardiovascular: Negative for chest pain. Respiratory: Positive shortness of breath. Gastrointestinal: Negative for abdominal pain Musculoskeletal: Negative for back pain. Neurological: Negative for headache 10-point ROS otherwise negative.  ____________________________________________   PHYSICAL EXAM:  VITAL SIGNS: ED Triage Vitals  Enc Vitals Group     BP 04/01/16 2126 (!) 152/75     Pulse Rate 04/01/16 2028 77     Resp 04/01/16 2028 16     Temp 04/01/16 2028 98.1 F (36.7 C)     Temp Source 04/01/16 2028 Oral     SpO2 04/01/16 2028 97 %  Weight 04/01/16 2028 207 lb (93.9 kg)     Height 04/01/16 2028 5\' 10"  (1.778 m)     Head Circumference --      Peak Flow --      Pain Score 04/01/16 2040 7     Pain Loc --      Pain Edu? --      Excl. in [place]? --     Constitutional: Alert and oriented. Well appearing and in no distress. Eyes: Normal exam ENT   Head: Normocephalic and atraumatic   Mouth/Throat: Mucous membranes are moist. Cardiovascular: Normal rate, regular rhythm. No murmur Respiratory: Normal respiratory effort without tachypnea nor retractions. Moderate expiratory wheeze bilaterally. No rales or rhonchi. Gastrointestinal: Soft and nontender. No distention.  Musculoskeletal: Nontender with normal range of motion in all extremities. No lower extremity edema. Mild tenderness bilaterally which patient states is unchanged for the past 4 years. Neurologic:  Normal speech and language. No gross focal neurologic  deficits Skin:  Skin is warm, dry and intact.  Psychiatric: Mood and affect are normal. Speech and behavior are normal.   ____________________________________________    EKG  EKG reviewed and interpreted by myself shows normal sinus rhythm at 73 bpm, narrow QRS, normal axis, normal intervals, RSR pattern most consistent with right bundle branch block. No concerning ST changes.  ____________________________________________    RADIOLOGY  Chest x-ray shows no acute change.  ____________________________________________   INITIAL IMPRESSION / ASSESSMENT AND PLAN / ED COURSE  Pertinent labs & imaging results that were available during my care of the patient were reviewed by me and considered in my medical decision making (see chart for details).  The patient presents with 3 days of shortness breath. Patient has moderate expiratory wheeze bilaterally. Labs show an elevated white blood cell count of 14,000, troponin pending. Chest x-ray is negative. EKG shows no concerning findings. Patient's exam is very consistent with a COPD exacerbation. We will treat with DuoNeb's, Solu-Medrol, and reevaluate.  Minimal improvement after DuoNeb's Solu-Medrol. Patient still with shortness of breath and moderate expiratory wheeze. I faxed the patient information to the [place], currently awaiting their response for transfer.  Patient care signed out to Dr. Dahlia Client.  ____________________________________________   FINAL CLINICAL IMPRESSION(S) / ED DIAGNOSES  COPD exacerbation Dyspnea    Harvest Dark, MD 04/01/16 [phone]

## 2016-04-02 LAB — BLOOD GAS, VENOUS
ACID-BASE EXCESS: 5.3 mmol/L — AB (ref 0.0–3.0)
Bicarbonate: 31.5 mEq/L — ABNORMAL HIGH (ref 21.0–28.0)
O2 SAT: 87.3 %
PATIENT TEMPERATURE: 37
pCO2, Ven: 52 mmHg (ref 44.0–60.0)
pH, Ven: 7.39 (ref 7.320–7.430)
pO2, Ven: 54 mmHg — ABNORMAL HIGH (ref 31.0–45.0)

## 2016-04-02 MED ORDER — PREDNISONE 20 MG PO TABS: 60.0000 mg | ORAL_TABLET | Freq: Every day | ORAL | 0 refills | Status: DC

## 2016-04-02 MED ORDER — MORPHINE SULFATE ER 30 MG PO TBCR
60.0000 mg | EXTENDED_RELEASE_TABLET | Freq: Three times a day (TID) | ORAL | Status: DC
  Administered 2016-04-02: 60 mg via ORAL
  Filled 2016-04-02 (×2): qty 2

## 2016-04-02 MED ORDER — MORPHINE SULFATE ER 60 MG PO TBCR: 60.0000 mg | EXTENDED_RELEASE_TABLET | Freq: Two times a day (BID) | ORAL | Status: DC

## 2016-04-02 NOTE — ED Provider Notes (Signed)
-----------------------------------------   2:23 AM on 04/02/2016 -----------------------------------------   Blood pressure (!) 125/53, pulse 73, temperature 98.1 F (36.7 C), temperature source Oral, resp. rate 15, height 5\' 10"  (1.778 m), weight 207 lb (93.9 kg), SpO2 96 %.  Assuming care from Dr. Kerman Passey.  In short, Alexander [name] is a 60 y.o. male with a chief complaint of Shortness of Breath and Chest Pain .  Refer to the original H&P for additional details.  The current plan of care is to discuss the patient with the [facility] in an effort to transfer for COPD exacerbation.  I was contacted by the [facility] and they feel that if the patient only needs to be admitted for observation and then would not stay more than 12-24 hours that he should be admitted to our hospital. They did not feel that the transfer was warranted at this time as the patient was doing well. I will contact our hospitalist to admit the patient to the hospitalist service.  The patient's vital signs are improved and his wheezing is resolved the hospitalist is willing to admit the patient reports that his insurance may not pay for it. He discussed this with the patient and the patient reports that he would rather go home if there is a risk that his insurance may not pay for his visit today. I did go back and listened to the patient's lung sounds in his lungs are clear. His breathing is not labored and he does not have any retractions. The patient will be discharged to home. I will give him some prednisone and have him follow back up with his primary care doctor.   Loney Hering, MD 04/02/16 [phone]

## 2016-04-02 NOTE — ED Notes (Signed)
MD at bedside. 

## 2016-04-02 NOTE — ED Notes (Signed)
[employer] form signed by patient and myself.  Secretary notified.

## 2016-04-05 ENCOUNTER — Ambulatory Visit
Admission: RE | Admit: 2016-04-05 | Discharge: 2016-04-05 | Disposition: A | Discharge status: Home / Self Care | Payer: Medicaid Other | Source: Ambulatory Visit | Attending: Internal Medicine | Admitting: Internal Medicine

## 2016-04-05 DIAGNOSIS — R1310 Dysphagia, unspecified: Secondary | ICD-10-CM | POA: Diagnosis present

## 2016-04-05 DIAGNOSIS — K219 Gastro-esophageal reflux disease without esophagitis: Secondary | ICD-10-CM | POA: Diagnosis not present

## 2016-04-10 ENCOUNTER — Emergency Department: Payer: Medicaid Other

## 2016-04-10 ENCOUNTER — Encounter: Payer: Self-pay | Admitting: *Deleted

## 2016-04-10 ENCOUNTER — Emergency Department
Admission: EM | Admit: 2016-04-10 | Discharge: 2016-04-10 | Disposition: A | Discharge status: Other Acute Inpatient Hospital | Payer: Medicaid Other | Attending: Emergency Medicine | Admitting: Emergency Medicine

## 2016-04-10 DIAGNOSIS — Z87891 Personal history of nicotine dependence: Secondary | ICD-10-CM | POA: Insufficient documentation

## 2016-04-10 DIAGNOSIS — R197 Diarrhea, unspecified: Secondary | ICD-10-CM | POA: Diagnosis not present

## 2016-04-10 DIAGNOSIS — Z79899 Other long term (current) drug therapy: Secondary | ICD-10-CM | POA: Diagnosis not present

## 2016-04-10 DIAGNOSIS — R112 Nausea with vomiting, unspecified: Secondary | ICD-10-CM | POA: Diagnosis not present

## 2016-04-10 DIAGNOSIS — R161 Splenomegaly, not elsewhere classified: Secondary | ICD-10-CM | POA: Insufficient documentation

## 2016-04-10 DIAGNOSIS — J45909 Unspecified asthma, uncomplicated: Secondary | ICD-10-CM | POA: Diagnosis not present

## 2016-04-10 DIAGNOSIS — J189 Pneumonia, unspecified organism: Secondary | ICD-10-CM | POA: Diagnosis not present

## 2016-04-10 DIAGNOSIS — R0602 Shortness of breath: Secondary | ICD-10-CM | POA: Diagnosis not present

## 2016-04-10 DIAGNOSIS — J449 Chronic obstructive pulmonary disease, unspecified: Secondary | ICD-10-CM | POA: Diagnosis not present

## 2016-04-10 LAB — URINALYSIS COMPLETE WITH MICROSCOPIC (ARMC ONLY)
BILIRUBIN URINE: NEGATIVE
Bacteria, UA: NONE SEEN
GLUCOSE, UA: NEGATIVE mg/dL
Hgb urine dipstick: NEGATIVE
Leukocytes, UA: NEGATIVE
Nitrite: NEGATIVE
PROTEIN: NEGATIVE mg/dL
SPECIFIC GRAVITY, URINE: 1.017 (ref 1.005–1.030)
SQUAMOUS EPITHELIAL / LPF: NONE SEEN
pH: 8 (ref 5.0–8.0)

## 2016-04-10 LAB — CBC
HCT: 41.6 % (ref 40.0–52.0)
Hemoglobin: 14.6 g/dL (ref 13.0–18.0)
MCH: 29.5 pg (ref 26.0–34.0)
MCHC: 35 g/dL (ref 32.0–36.0)
MCV: 84.5 fL (ref 80.0–100.0)
PLATELETS: 244 10*3/uL (ref 150–440)
RBC: 4.93 MIL/uL (ref 4.40–5.90)
RDW: 15 % — AB (ref 11.5–14.5)
WBC: 16.7 10*3/uL — ABNORMAL HIGH (ref 3.8–10.6)

## 2016-04-10 LAB — COMPREHENSIVE METABOLIC PANEL
ALK PHOS: 97 U/L (ref 38–126)
ALT: 20 U/L (ref 17–63)
AST: 20 U/L (ref 15–41)
Albumin: 4.4 g/dL (ref 3.5–5.0)
Anion gap: 8 (ref 5–15)
BUN: 10 mg/dL (ref 6–20)
CALCIUM: 9.1 mg/dL (ref 8.9–10.3)
CHLORIDE: 104 mmol/L (ref 101–111)
CO2: 25 mmol/L (ref 22–32)
CREATININE: 0.66 mg/dL (ref 0.61–1.24)
GFR calc non Af Amer: >60 mL/min (ref >60)
Glucose, Bld: 150 mg/dL — ABNORMAL HIGH (ref 65–99)
Potassium: 3.7 mmol/L (ref 3.5–5.1)
SODIUM: 137 mmol/L (ref 135–145)
Total Bilirubin: 0.5 mg/dL (ref 0.3–1.2)
Total Protein: 7.6 g/dL (ref 6.5–8.1)

## 2016-04-10 LAB — LIPASE, BLOOD: LIPASE: 13 U/L (ref 11–51)

## 2016-04-10 LAB — TROPONIN I: Troponin I: <0.03 ng/mL (ref <0.03)

## 2016-04-10 MED ORDER — HYDROMORPHONE HCL 1 MG/ML IJ SOLN
1.0000 mg | INTRAMUSCULAR | Status: DC | PRN
  Administered 2016-04-10 (×4): 1 mg via INTRAVENOUS
  Filled 2016-04-10 (×4): qty 1

## 2016-04-10 MED ORDER — ONDANSETRON HCL 4 MG/2ML IJ SOLN
4.0000 mg | Freq: Once | INTRAMUSCULAR | Status: AC | PRN
  Administered 2016-04-10: 4 mg via INTRAVENOUS
  Filled 2016-04-10: qty 2

## 2016-04-10 MED ORDER — IOPAMIDOL (ISOVUE-300) INJECTION 61%
100.0000 mL | Freq: Once | INTRAVENOUS | Status: AC | PRN
  Administered 2016-04-10: 100 mL via INTRAVENOUS

## 2016-04-10 MED ORDER — PROMETHAZINE HCL 25 MG/ML IJ SOLN
12.5000 mg | Freq: Once | INTRAMUSCULAR | Status: AC
  Administered 2016-04-10: 12.5 mg via INTRAVENOUS

## 2016-04-10 MED ORDER — PROMETHAZINE HCL 25 MG/ML IJ SOLN
12.5000 mg | Freq: Once | INTRAMUSCULAR | Status: AC
  Administered 2016-04-10: 12.5 mg via INTRAVENOUS
  Filled 2016-04-10: qty 1

## 2016-04-10 MED ORDER — DIATRIZOATE MEGLUMINE & SODIUM 66-10 % PO SOLN
15.0000 mL | Freq: Once | ORAL | Status: AC
  Administered 2016-04-10: 15 mL via ORAL

## 2016-04-10 MED ORDER — LEVOFLOXACIN IN D5W 750 MG/150ML IV SOLN
750.0000 mg | Freq: Once | INTRAVENOUS | Status: AC
  Administered 2016-04-10: 750 mg via INTRAVENOUS
  Filled 2016-04-10: qty 150

## 2016-04-10 MED ORDER — ACETAMINOPHEN 500 MG PO TABS
ORAL_TABLET | ORAL | Status: AC
  Filled 2016-04-10: qty 2

## 2016-04-10 MED ORDER — ACETAMINOPHEN 500 MG PO TABS
1000.0000 mg | ORAL_TABLET | Freq: Once | ORAL | Status: AC
  Administered 2016-04-10: 1000 mg via ORAL

## 2016-04-10 MED ORDER — PROMETHAZINE HCL 25 MG/ML IJ SOLN
INTRAMUSCULAR | Status: AC
  Administered 2016-04-10: 12.5 mg via INTRAVENOUS
  Filled 2016-04-10: qty 1

## 2016-04-10 MED ORDER — SODIUM CHLORIDE 0.9 % IV BOLUS (SEPSIS)
1000.0000 mL | Freq: Once | INTRAVENOUS | Status: AC
  Administered 2016-04-10: 1000 mL via INTRAVENOUS

## 2016-04-10 MED ORDER — ONDANSETRON HCL 4 MG/2ML IJ SOLN
4.0000 mg | Freq: Once | INTRAMUSCULAR | Status: AC
  Administered 2016-04-10: 4 mg via INTRAVENOUS
  Filled 2016-04-10: qty 2

## 2016-04-10 NOTE — Care Management Note (Signed)
Case Management Note  Patient Details  Name: Alexander [name] MRN: [id number] Date of Birth: 1956-01-06  Subjective/Objective:        Wife at bedside has verified patient has 100% VA benefits. Patient packet will be sent to [facility] once he returns from [place], and patient is able to sign documents.            Action/Plan:   Expected Discharge Date:                  Expected Discharge Plan:     In-House Referral:     Discharge planning Services     Post Acute Care Choice:    Choice offered to:     DME Arranged:    DME Agency:     HH Arranged:    HH Agency:     Status of Service:     If discussed at [employer] of Stay Meetings, dates discussed:    Additional Comments:  Beau Fanny, RN 04/10/2016, 1:19 PM

## 2016-04-10 NOTE — ED Provider Notes (Signed)
[facility] Emergency Department Provider Note  ____________________________________________  Time seen: Approximately 10:54 AM  I have reviewed the triage vital signs and the nursing notes.   HISTORY  Chief Complaint Emesis; Shortness of Breath; and Diarrhea   HPI Alexander [name] is a 60 y.o. male with a history of mental cell lymphoma, diverticulosis, COPD, DVT on Lovenox who presents for evaluation of vomiting and diarrhea. Patient reports that his symptoms started yesterday. Has had too many to count episodes of nonbloody nonbilious emesis and watery diarrhea. No recent antibiotic use. No prior history of C. difficile. No fever or chills. Patient does have diffuse sharp constant abdominal pain however reports that this is since 2015 when he was diagnosed with lymphoma. He has had no urinary symptoms, no melena, no hematemesis, no hematochezia. Patient also reports shortness of breath however reports that this symptoms chronic also for the last 3 years and that he feels is at his baseline. He denies wheezing. He does have a productive cough for the last week. He was treated for COPD exacerbation 9 days ago. He reports he hasn't taken his morphine and Dilaudid this morning.  Past Medical History:  Diagnosis Date  . Asthma   . BPH (benign prostatic hyperplasia)   . COPD (chronic obstructive pulmonary disease) ([place])   . Deviated nasal septum   . DVT (deep venous thrombosis) ([place])   . Erosive esophagitis   . GERD (gastroesophageal reflux disease)   . 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 Migraine     Patient Active Problem List   Diagnosis Date Noted  . Acute encephalopathy 10/02/2015  . COPD (chronic obstructive pulmonary disease) ([place]) 10/02/2015  . GERD (gastroesophageal reflux disease) 10/02/2015  . BPH (benign prostatic hyperplasia) 10/02/2015  . History of DVT (deep vein thrombosis) 10/02/2015  . Orthostatic hypotension 09/28/2015  . Vasovagal syncope  09/28/2015  . Deep vein thrombosis (DVT) of distal vein of lower extremity ([place]) 09/28/2015  . Hypokalemia 09/25/2015  . Gastroenteritis, acute 09/25/2015  . Gastroenteritis 09/25/2015  . Pressure ulcer 08/18/2015  . HCAP (healthcare-associated pneumonia) 08/17/2015  . Syncope and collapse 08/10/2015  . Syncope 08/10/2015  . Protein-calorie malnutrition, severe 08/10/2015  . Abdominal pain 07/17/2015  . Nausea & vomiting 07/17/2015  . Malnutrition of moderate degree 07/17/2015  . Diarrhea 05/18/2015  . DEVIATED SEPTUM 05/18/2007  . ALLERGIC RHINITIS 05/18/2007  . ASTHMA 05/18/2007    Past Surgical History:  Procedure Laterality Date  . barryx N/A   . ESOPHAGOGASTRODUODENOSCOPY (EGD) WITH PROPOFOL N/A 02/23/2015   Procedure: ESOPHAGOGASTRODUODENOSCOPY (EGD) WITH PROPOFOL;  Surgeon: Hulen Luster, MD;  Location: [facility] ENDOSCOPY;  Service: Gastroenterology;  Laterality: N/A;  . ESOPHAGOGASTRODUODENOSCOPY (EGD) WITH PROPOFOL N/A 05/04/2015   Procedure: ESOPHAGOGASTRODUODENOSCOPY (EGD) WITH PROPOFOL;  Surgeon: Hulen Luster, MD;  Location: [facility] ENDOSCOPY;  Service: Gastroenterology;  Laterality: N/A;  . ESOPHAGOGASTRODUODENOSCOPY (EGD) WITH PROPOFOL N/A 07/27/2015   Procedure: ESOPHAGOGASTRODUODENOSCOPY (EGD) WITH PROPOFOL;  Surgeon: Hulen Luster, MD;  Location: [facility] ENDOSCOPY;  Service: Gastroenterology;  Laterality: N/A;  . ESOPHAGOGASTRODUODENOSCOPY (EGD) WITH PROPOFOL N/A 10/05/2015   Procedure: ESOPHAGOGASTRODUODENOSCOPY (EGD) WITH PROPOFOL;  Surgeon: Hulen Luster, MD;  Location: [facility] ENDOSCOPY;  Service: Gastroenterology;  Laterality: N/A;  . ESOPHAGOGASTRODUODENOSCOPY (EGD) WITH PROPOFOL N/A 01/04/2016   Procedure: ESOPHAGOGASTRODUODENOSCOPY (EGD) WITH PROPOFOL;  Surgeon: Hulen Luster, MD;  Location: [facility] ENDOSCOPY;  Service: Gastroenterology;  Laterality: N/A;  . NASAL SEPTUM SURGERY    . THROAT SURGERY    . TONSILLECTOMY    .  UPPER ESOPHAGEAL ENDOSCOPIC ULTRASOUND (EUS) N/A 10/19/2015   Procedure:  UPPER ESOPHAGEAL ENDOSCOPIC ULTRASOUND (EUS);  Surgeon: Holly Bodily, MD;  Location: [facility] ENDOSCOPY;  Service: Gastroenterology;  Laterality: N/A;    Prior to Admission medications   Medication Sig Start Date End Date Taking? Authorizing Provider  acetaminophen (TYLENOL) 500 MG tablet Take 1,000 mg by mouth every 8 (eight) hours as needed for mild pain or fever.    Historical Provider, MD  albuterol (PROAIR HFA) 108 (90 Base) MCG/ACT inhaler Inhale 1-2 puffs into the lungs every 4 (four) hours as needed for wheezing or shortness of breath.     Historical Provider, MD  diazepam (VALIUM) 5 MG tablet Take 1 tablet (5 mg total) by mouth every 8 (eight) hours as needed for anxiety. Patient not taking: Reported on 04/01/2016 12/28/15   Earleen Newport, MD  enoxaparin (LOVENOX) 100 MG/ML injection Inject 100 mg into the skin every 12 (twelve) hours.    Historical Provider, MD  fluticasone (FLONASE) 50 MCG/ACT nasal spray Place 2 sprays into both nostrils daily. Patient not taking: Reported on 04/01/2016 10/04/15   Nicholes Mango, MD  gabapentin (NEURONTIN) 300 MG capsule Take 300 mg by mouth 3 (three) times daily.    Historical Provider, MD  HYDROmorphone (DILAUDID) 4 MG tablet Take by mouth every 4 (four) hours as needed for severe pain.    Historical Provider, MD  loperamide (IMODIUM) 2 MG capsule Take 2 mg by mouth as needed for diarrhea or loose stools.    Historical Provider, MD  morphine (MS CONTIN) 30 MG 12 hr tablet Take 30 mg by mouth 3 (three) times daily.    Historical Provider, MD  nortriptyline (PAMELOR) 25 MG capsule Take 25 mg by mouth at bedtime.    Historical Provider, MD  omeprazole (PRILOSEC) 20 MG capsule Take 20 mg by mouth 2 (two) times daily.    Historical Provider, MD  potassium chloride (K-DUR) 10 MEQ tablet Take 1 tablet (10 mEq total) by mouth 2 (two) times daily. Patient not taking: Reported on 04/01/2016 09/30/15   Fredia Sorrow, MD  predniSONE (DELTASONE) 20 MG tablet  Take 3 tablets (60 mg total) by mouth daily. 04/02/16   Loney Hering, MD  promethazine (PHENERGAN) 25 MG tablet Take 1 tablet (25 mg total) by mouth every 6 (six) hours as needed for nausea or vomiting. 09/30/15   Fredia Sorrow, MD  senna (SENOKOT) 8.6 MG TABS tablet Take 2 tablets by mouth 2 (two) times daily as needed for mild constipation.    Historical Provider, MD  traZODone (DESYREL) 50 MG tablet Take 50 mg by mouth at bedtime.    Historical Provider, MD    Allergies Bee venom and Ativan [lorazepam]  Family History  Problem Relation Age of Onset  . Mesothelioma Father   . Breast cancer Mother     Social History Social History  Substance Use Topics  . Smoking status: Former Research scientist (life sciences)  . Smokeless tobacco: Never Used  . Alcohol use No    Review of Systems Constitutional: Negative for fever. Eyes: Negative for visual changes. ENT: Negative for sore throat. Cardiovascular: Negative for chest pain. Respiratory: + shortness of breath. Gastrointestinal: + diffuse abdominal pain, vomiting and diarrhea. Genitourinary: Negative for dysuria. Musculoskeletal: Negative for back pain. Skin: Negative for rash. Neurological: Negative for headaches, weakness or numbness.  ____________________________________________   PHYSICAL EXAM:  VITAL SIGNS: ED Triage Vitals [04/10/16 0935]  Enc Vitals Group     BP (!) 161/94  Pulse Rate 80     Resp 18     Temp      Temp src      SpO2 98 %     Weight 207 lb (93.9 kg)     Height 5\' 10"  (1.778 m)     Head Circumference      Peak Flow      Pain Score 9     Pain Loc      Pain Edu?      Excl. in [place]?     Constitutional: Alert and oriented. Well appearing and in no apparent distress. HEENT:      Head: Normocephalic and atraumatic.         Eyes: Conjunctivae are normal. Sclera is non-icteric. EOMI. PERRL      Mouth/Throat: Mucous membranes are moist.       Neck: Supple with no signs of meningismus. Cardiovascular: Regular rate  and rhythm. No murmurs, gallops, or rubs. 2+ symmetrical distal pulses are present in all extremities. No JVD. Respiratory: Normal respiratory effort. Lungs are clear to auscultation bilaterally. No wheezes, crackles, or rhonchi.  Gastrointestinal: Soft, Diffusely tender to palpation with no rebound or guarding, positive bowel sounds  Genitourinary: No CVA tenderness. Musculoskeletal: Nontender with normal range of motion in all extremities. No edema, cyanosis, or erythema of extremities. Neurologic: Normal speech and language. Face is symmetric. Moving all extremities. No gross focal neurologic deficits are appreciated. Skin: Skin is warm, dry and intact. No rash noted. Psychiatric: Mood and affect are normal. Speech and behavior are normal.  ____________________________________________   LABS (all labs ordered are listed, but only abnormal results are displayed)  Labs Reviewed  COMPREHENSIVE METABOLIC PANEL - Abnormal; Notable for the following:       Result Value   Glucose, Bld 150 (*)    All other components within normal limits  CBC - Abnormal; Notable for the following:    WBC 16.7 (*)    RDW 15.0 (*)    All other components within normal limits  URINALYSIS COMPLETEWITH MICROSCOPIC ([facility] ONLY) - Abnormal; Notable for the following:    Color, Urine YELLOW (*)    APPearance CLEAR (*)    Ketones, ur TRACE (*)    All other components within normal limits  URINE CULTURE  LIPASE, BLOOD  TROPONIN I   ____________________________________________  EKG  ED ECG REPORT I, Rudene Re, the attending physician, personally viewed and interpreted this ECG.  Normal sinus rhythm, rate of 89, normal intervals, normal axis, no ST elevations or depressions. ____________________________________________  RADIOLOGY  CXR: Patchy alveolar opacity at the right base suggests pneumonia.    CT a/p: Patchy airspace process over the right lower lobe likely pneumonia.  Mild dilatation  of the proximal jejunum measuring up to 4 cm in diameter with gradual transition to normal caliber without focal transition point. This may represent a partial/early obstructive process versus focal ileus.  Mild splenomegaly, slightly worse.  Stable 1.6 cm left adrenal nodule likely an adenoma.  Sub cm right renal cortical hypodensity unchanged likely a cyst but too small to characterize.  Nonspecific soft tissue not over the bilateral anterior abdominal wall just below the level of the umbilicus which are new. Findings may be due to injection sites versus metastatic subcutaneous disease as recommend clinical correlation. ____________________________________________   PROCEDURES  Procedure(s) performed: None Procedures Critical Care performed:  None ____________________________________________   INITIAL IMPRESSION / ASSESSMENT AND PLAN / ED COURSE  60 y.o. male with a history of  mental cell lymphoma, diverticulosis, COPD, DVT on Lovenox who presents for evaluation of vomiting and diarrhea since yesterday. Patient also complaining of diffuse abdominal pain however per patient this is chronic since 2015 and also complaining of shortness of breath however no new oxygen requirement and also chronic complaint since 2015. Patient has diffuse abdominal tenderness to palpation. His lungs are clear to auscultation with no wheezing or crackles. His vital signs are within normal limits. We'll give IV fluids and IV Zofran for the vomiting. We'll check labs and get a CT abdomen and pelvis to rule out intra-abdominal pathology as patient has a history of lymphoma with intra-abdominal lymph node involvement. We'll also treat his pain with IV Dilaudid.  Clinical Course  Comment By Time  CXR concerning for PNA. WBC 16.7. Diarrhea has resolved. Patient continues to endorse nausea and vomiting after zofran x 2. Will give phenergan. CT a/p pending. Will give levaquin. [facility]. Rudene Re, MD 08/23 1321  CT a/p confirms PNA and also concern for possible early or partial SBO. [facility] however clinically patient does not seem to be obstructive. Rudene Re, MD 08/23 1439  Spoke with Dr. Mel Almond, MD at the [facility] who accepted patient for admission for IV abx for PNA. Patient remains stable and will be transferred to the [place]. Rudene Re, MD 08/23 1620    Pertinent labs & imaging results that were available during my care of the patient were reviewed by me and considered in my medical decision making (see chart for details).    ____________________________________________   FINAL CLINICAL IMPRESSION(S) / ED DIAGNOSES  Final diagnoses:  SOB (shortness of breath)  Nausea vomiting and diarrhea  Community acquired pneumonia      NEW MEDICATIONS STARTED DURING THIS VISIT:  New Prescriptions   No medications on file     Note:  This document was prepared using Dragon voice recognition software and may include unintentional dictation errors.    Rudene Re, MD 04/10/16 [phone]

## 2016-04-10 NOTE — ED Notes (Signed)
Patient transported to CT 

## 2016-04-10 NOTE — ED Notes (Signed)
Informed pt of need for urine sample and given urinal

## 2016-04-10 NOTE — ED Triage Notes (Addendum)
Pt states nausea and vomiting that began this AM, states diarrhea that began last night and SOB, pt arrives vomiting, wife with pt, states he is currently being evaluated for tx for lymphoma

## 2016-04-11 LAB — URINE CULTURE: CULTURE: NO GROWTH

## 2016-06-11 IMAGING — CT CT HEAD W/O CM
2 series · 14 of 30 positions shown, 16 images · non-contrast
Comparison: CT head September 22, 2015

CLINICAL DATA: Weakness and confusion, recurrent falls, visual
hallucinations. History of tremor, lymphoma, chronic abdominal pain.

EXAM:
CT HEAD WITHOUT CONTRAST
TECHNIQUE: Contiguous axial images were obtained from the base of the skull
through the vertex without intravenous contrast.

[Series 2: head wo · axial · 0.48mm/px · z∈[-160,-60]mm · 6 of 30 slices shown, 8 images]
[im 5/30  brain]
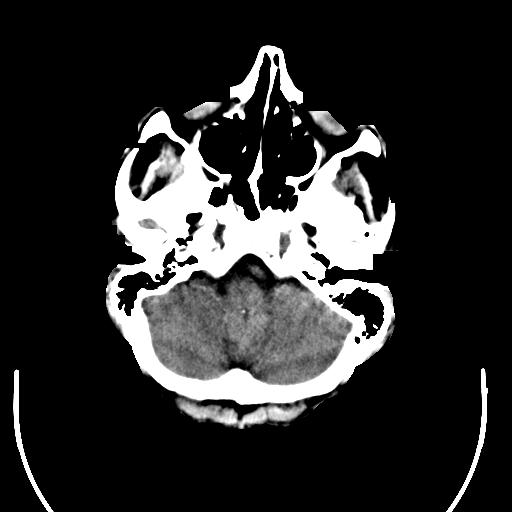
[im 5/30  bone]
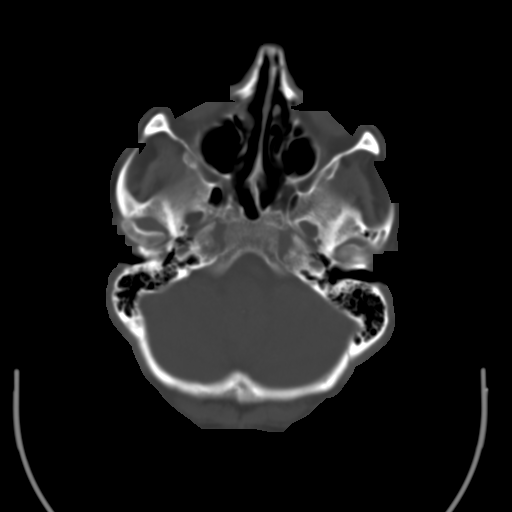
[im 9/30  brain]
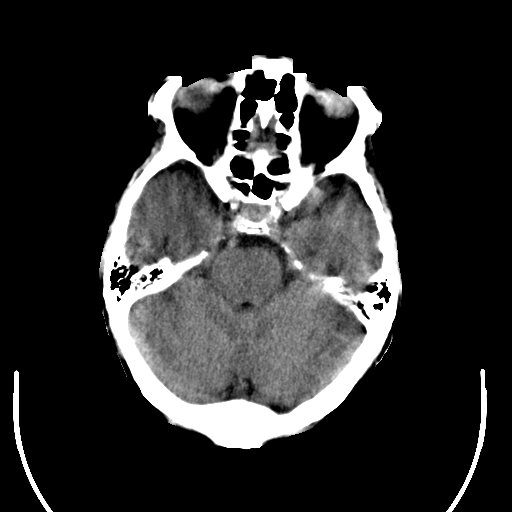
[im 13/30  brain]
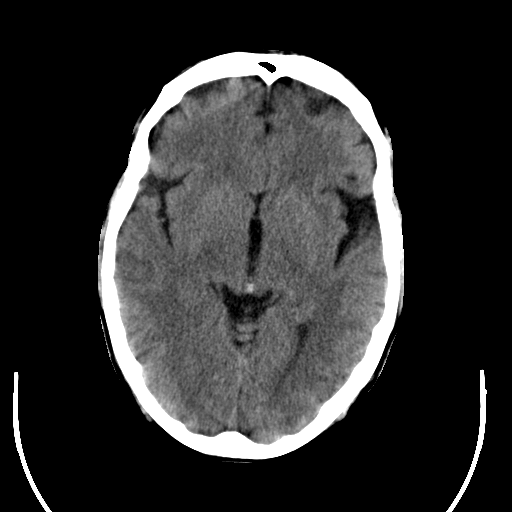
[im 17/30  brain]
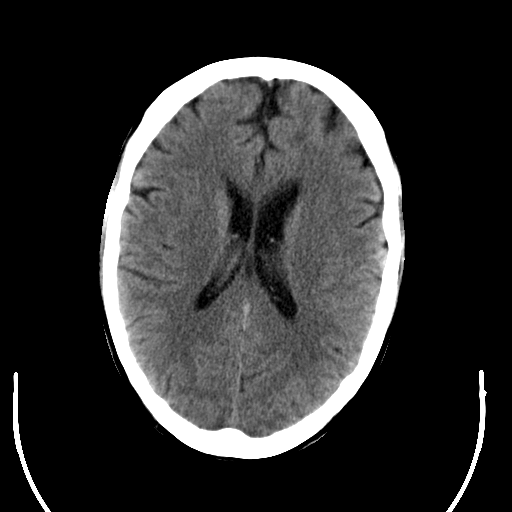
[im 21/30  brain]
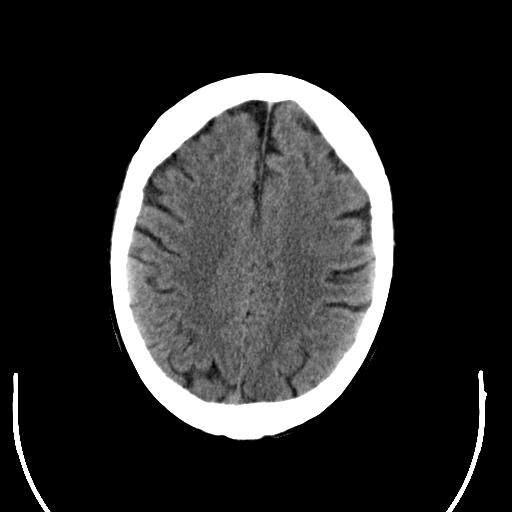
[im 21/30  bone]
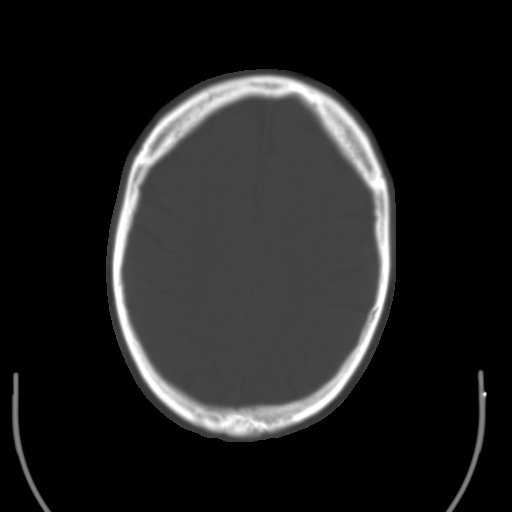
[im 25/30  brain]
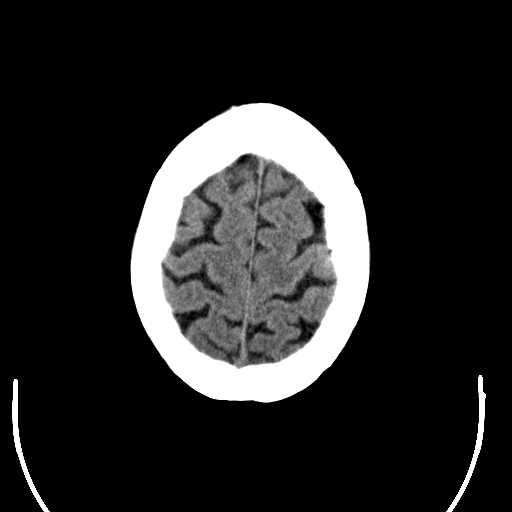

[Series 3: head bone · axial · 0.48mm/px · z∈[-174,-38]mm · 8 of 85 slices shown]
[im 9/85  bone]
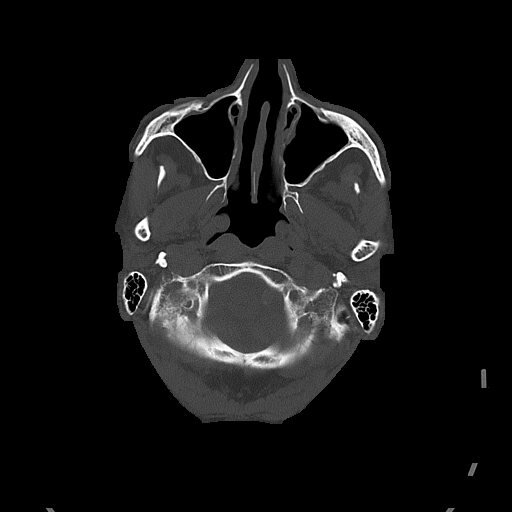
[im 17/85  bone]
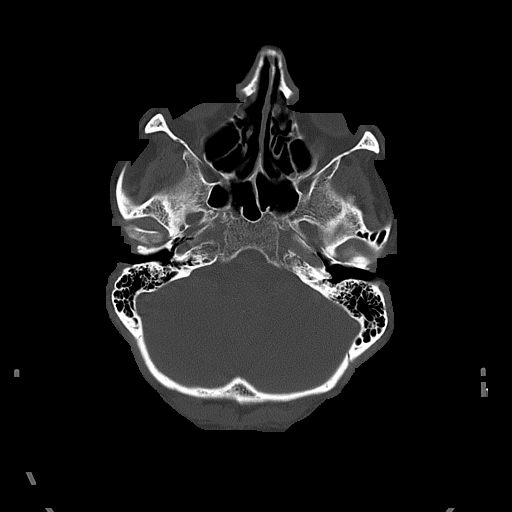
[im 29/85  bone]
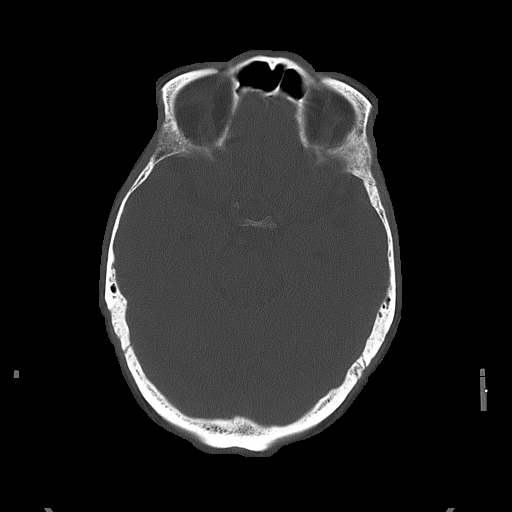
[im 37/85  bone]
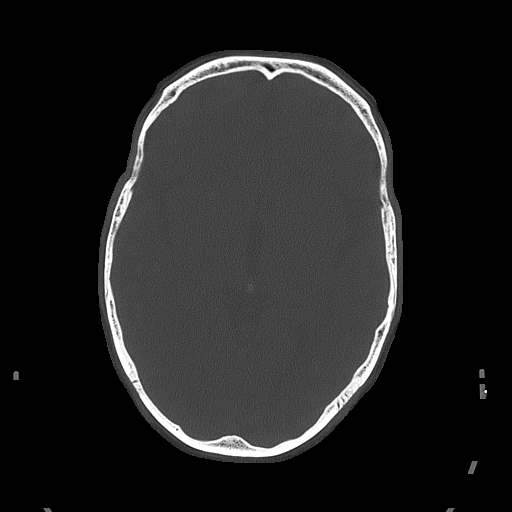
[im 49/85  bone]
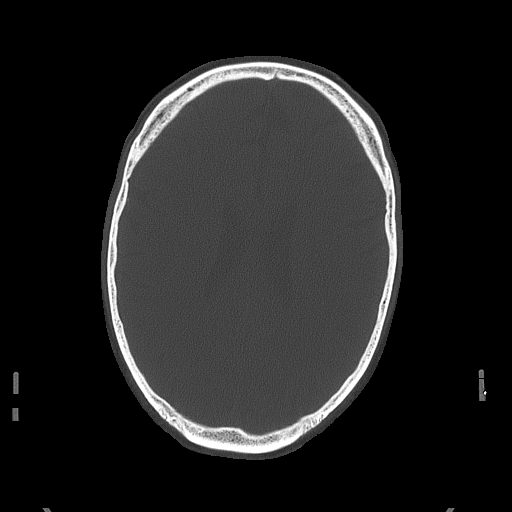
[im 57/85  bone]
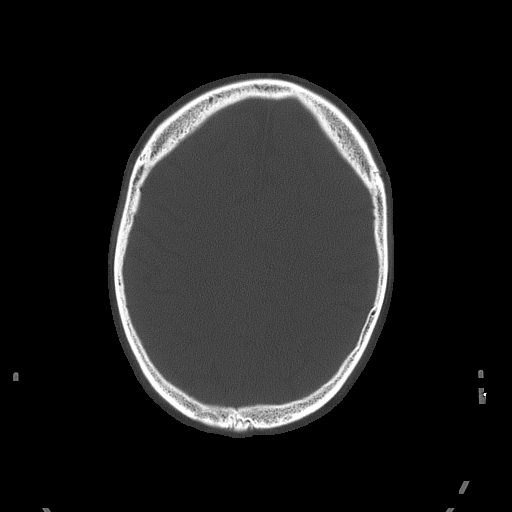
[im 69/85  bone]
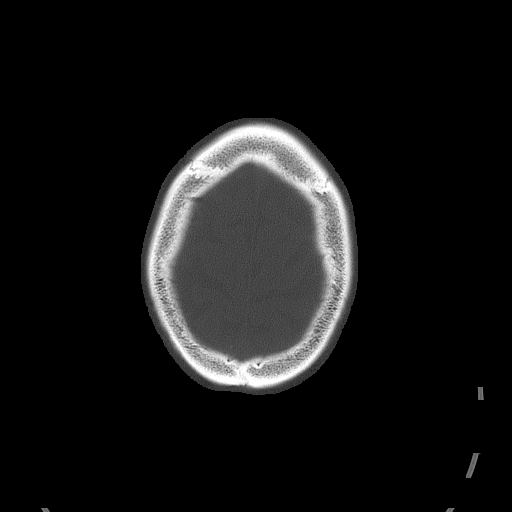
[im 77/85  bone]
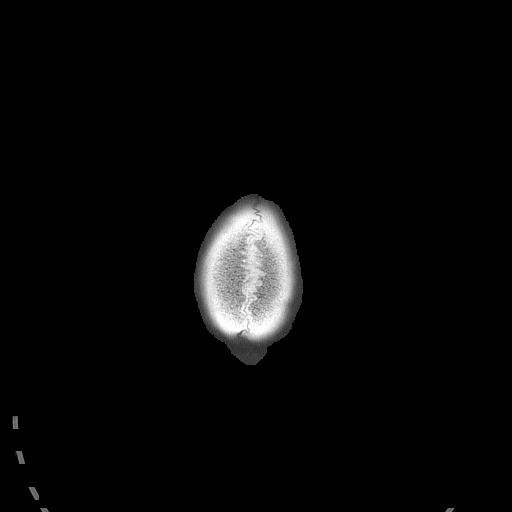

[14 of 30 positions shown; findings below may reference images not displayed]

FINDINGS: Mild ventriculomegaly on the basis of global parenchymal brain
volume loss as there is overall commensurate enlargement of cerebral
sulci and cerebellar folia. Mild patchy supratentorial white matter
hypodensities without midline shift, mass effect, intraparenchymal
hemorrhage or acute large vascular territory infarcts. No abnormal
extra-axial fluid collections. Basal cisterns are patent. Mild
calcific atherosclerosis of the carotid siphons.

Dehiscent cartilaginous nasal septum can be seen with chronic drug
use. Mild paranasal sinus mucosal thickening without air-fluid
levels. The mastoid air cells are well aerated. No skull fracture.
IMPRESSION: No acute intracranial process.

Mild global parenchymal brain volume loss for age and mild chronic
small vessel ischemic disease.

## 2016-07-01 ENCOUNTER — Emergency Department: Payer: Medicaid Other

## 2016-07-01 ENCOUNTER — Other Ambulatory Visit: Payer: Self-pay

## 2016-07-01 ENCOUNTER — Inpatient Hospital Stay
Admission: EM | Admit: 2016-07-01 | Discharge: 2016-07-05 | DRG: 871 | Disposition: A | Discharge status: Home Health | Payer: Medicaid Other | Attending: Internal Medicine | Admitting: Internal Medicine

## 2016-07-01 ENCOUNTER — Encounter: Payer: Self-pay | Admitting: Emergency Medicine

## 2016-07-01 DIAGNOSIS — Z79891 Long term (current) use of opiate analgesic: Secondary | ICD-10-CM

## 2016-07-01 DIAGNOSIS — C8310 Mantle cell lymphoma, unspecified site: Secondary | ICD-10-CM | POA: Diagnosis present

## 2016-07-01 DIAGNOSIS — Z836 Family history of other diseases of the respiratory system: Secondary | ICD-10-CM

## 2016-07-01 DIAGNOSIS — A419 Sepsis, unspecified organism: Secondary | ICD-10-CM | POA: Diagnosis present

## 2016-07-01 DIAGNOSIS — E876 Hypokalemia: Secondary | ICD-10-CM | POA: Diagnosis present

## 2016-07-01 DIAGNOSIS — J189 Pneumonia, unspecified organism: Secondary | ICD-10-CM | POA: Diagnosis present

## 2016-07-01 DIAGNOSIS — K219 Gastro-esophageal reflux disease without esophagitis: Secondary | ICD-10-CM | POA: Diagnosis present

## 2016-07-01 DIAGNOSIS — N400 Enlarged prostate without lower urinary tract symptoms: Secondary | ICD-10-CM | POA: Diagnosis present

## 2016-07-01 DIAGNOSIS — Z87891 Personal history of nicotine dependence: Secondary | ICD-10-CM | POA: Diagnosis not present

## 2016-07-01 DIAGNOSIS — Z91030 Bee allergy status: Secondary | ICD-10-CM

## 2016-07-01 DIAGNOSIS — F329 Major depressive disorder, single episode, unspecified: Secondary | ICD-10-CM | POA: Diagnosis present

## 2016-07-01 DIAGNOSIS — Z6826 Body mass index (BMI) 26.0-26.9, adult: Secondary | ICD-10-CM

## 2016-07-01 DIAGNOSIS — Z7901 Long term (current) use of anticoagulants: Secondary | ICD-10-CM | POA: Diagnosis not present

## 2016-07-01 DIAGNOSIS — Z803 Family history of malignant neoplasm of breast: Secondary | ICD-10-CM

## 2016-07-01 DIAGNOSIS — G43909 Migraine, unspecified, not intractable, without status migrainosus: Secondary | ICD-10-CM | POA: Diagnosis present

## 2016-07-01 DIAGNOSIS — Z7951 Long term (current) use of inhaled steroids: Secondary | ICD-10-CM | POA: Diagnosis not present

## 2016-07-01 DIAGNOSIS — Z888 Allergy status to other drugs, medicaments and biological substances status: Secondary | ICD-10-CM | POA: Diagnosis not present

## 2016-07-01 DIAGNOSIS — Z79899 Other long term (current) drug therapy: Secondary | ICD-10-CM

## 2016-07-01 DIAGNOSIS — M6281 Muscle weakness (generalized): Secondary | ICD-10-CM

## 2016-07-01 DIAGNOSIS — Z66 Do not resuscitate: Secondary | ICD-10-CM | POA: Diagnosis present

## 2016-07-01 DIAGNOSIS — R2681 Unsteadiness on feet: Secondary | ICD-10-CM

## 2016-07-01 DIAGNOSIS — R0602 Shortness of breath: Secondary | ICD-10-CM | POA: Diagnosis present

## 2016-07-01 DIAGNOSIS — R0600 Dyspnea, unspecified: Secondary | ICD-10-CM

## 2016-07-01 DIAGNOSIS — C7951 Secondary malignant neoplasm of bone: Secondary | ICD-10-CM | POA: Diagnosis present

## 2016-07-01 DIAGNOSIS — J440 Chronic obstructive pulmonary disease with acute lower respiratory infection: Secondary | ICD-10-CM | POA: Diagnosis present

## 2016-07-01 DIAGNOSIS — Z7952 Long term (current) use of systemic steroids: Secondary | ICD-10-CM | POA: Diagnosis not present

## 2016-07-01 DIAGNOSIS — R531 Weakness: Secondary | ICD-10-CM

## 2016-07-01 DIAGNOSIS — C8590 Non-Hodgkin lymphoma, unspecified, unspecified site: Secondary | ICD-10-CM

## 2016-07-01 DIAGNOSIS — J181 Lobar pneumonia, unspecified organism: Secondary | ICD-10-CM

## 2016-07-01 LAB — CBC WITH DIFFERENTIAL/PLATELET
BASOS ABS: 0.2 10*3/uL — AB (ref 0–0.1)
BASOS PCT: 1 %
EOS ABS: 0.2 10*3/uL (ref 0–0.7)
Eosinophils Relative: 1 %
HCT: 39.3 % — ABNORMAL LOW (ref 40.0–52.0)
Hemoglobin: 13.5 g/dL (ref 13.0–18.0)
LYMPHS ABS: 8.3 10*3/uL — AB (ref 1.0–3.6)
Lymphocytes Relative: 55 %
MCH: 29.8 pg (ref 26.0–34.0)
MCHC: 34.3 g/dL (ref 32.0–36.0)
MCV: 86.9 fL (ref 80.0–100.0)
Monocytes Absolute: 0.8 10*3/uL (ref 0.2–1.0)
Monocytes Relative: 5 %
NEUTROS ABS: 5.8 10*3/uL (ref 1.4–6.5)
Neutrophils Relative %: 38 %
PLATELETS: 236 10*3/uL (ref 150–440)
RBC: 4.53 MIL/uL (ref 4.40–5.90)
RDW: 13.7 % (ref 11.5–14.5)
WBC: 15.3 10*3/uL — ABNORMAL HIGH (ref 3.8–10.6)

## 2016-07-01 LAB — COMPREHENSIVE METABOLIC PANEL
ALT: 23 U/L (ref 17–63)
ANION GAP: 13 (ref 5–15)
AST: 33 U/L (ref 15–41)
Albumin: 4 g/dL (ref 3.5–5.0)
Alkaline Phosphatase: 101 U/L (ref 38–126)
BILIRUBIN TOTAL: 0.8 mg/dL (ref 0.3–1.2)
BUN: 7 mg/dL (ref 6–20)
CHLORIDE: 98 mmol/L — AB (ref 101–111)
CO2: 29 mmol/L (ref 22–32)
Calcium: 8.9 mg/dL (ref 8.9–10.3)
Creatinine, Ser: 0.58 mg/dL — ABNORMAL LOW (ref 0.61–1.24)
GFR calc Af Amer: >60 mL/min (ref >60)
Glucose, Bld: 119 mg/dL — ABNORMAL HIGH (ref 65–99)
POTASSIUM: 2.8 mmol/L — AB (ref 3.5–5.1)
Sodium: 140 mmol/L (ref 135–145)
TOTAL PROTEIN: 7.9 g/dL (ref 6.5–8.1)

## 2016-07-01 LAB — GLUCOSE, CAPILLARY: GLUCOSE-CAPILLARY: 105 mg/dL — AB (ref 65–99)

## 2016-07-01 LAB — TROPONIN I

## 2016-07-01 MED ORDER — TAMSULOSIN HCL 0.4 MG PO CAPS
0.4000 mg | ORAL_CAPSULE | Freq: Every day | ORAL | Status: DC
  Administered 2016-07-01 – 2016-07-05 (×5): 0.4 mg via ORAL
  Filled 2016-07-01 (×5): qty 1

## 2016-07-01 MED ORDER — FLUCONAZOLE 100 MG PO TABS
100.0000 mg | ORAL_TABLET | Freq: Every day | ORAL | Status: DC
  Administered 2016-07-01 – 2016-07-05 (×5): 100 mg via ORAL
  Filled 2016-07-01 (×6): qty 1

## 2016-07-01 MED ORDER — CEFTRIAXONE SODIUM 1 G IJ SOLR
1.0000 g | INTRAMUSCULAR | Status: DC
  Filled 2016-07-01: qty 10

## 2016-07-01 MED ORDER — HYDROCOD POLST-CPM POLST ER 10-8 MG/5ML PO SUER
5.0000 mL | Freq: Two times a day (BID) | ORAL | Status: DC
  Administered 2016-07-01 – 2016-07-05 (×8): 5 mL via ORAL
  Filled 2016-07-01 (×8): qty 5

## 2016-07-01 MED ORDER — ACETAMINOPHEN 650 MG RE SUPP: 650.0000 mg | Freq: Four times a day (QID) | RECTAL | Status: DC | PRN

## 2016-07-01 MED ORDER — SODIUM CHLORIDE 0.9 % IV BOLUS (SEPSIS)
1000.0000 mL | Freq: Once | INTRAVENOUS | Status: AC
  Administered 2016-07-01: 1000 mL via INTRAVENOUS

## 2016-07-01 MED ORDER — POTASSIUM CHLORIDE CRYS ER 20 MEQ PO TBCR
40.0000 meq | EXTENDED_RELEASE_TABLET | Freq: Once | ORAL | Status: AC
  Administered 2016-07-01: 40 meq via ORAL
  Filled 2016-07-01: qty 2

## 2016-07-01 MED ORDER — MOMETASONE FURO-FORMOTEROL FUM 100-5 MCG/ACT IN AERO
2.0000 | INHALATION_SPRAY | Freq: Two times a day (BID) | RESPIRATORY_TRACT | Status: DC
  Administered 2016-07-01 – 2016-07-05 (×8): 2 via RESPIRATORY_TRACT
  Filled 2016-07-01: qty 8.8

## 2016-07-01 MED ORDER — MORPHINE SULFATE (PF) 4 MG/ML IV SOLN
4.0000 mg | Freq: Once | INTRAVENOUS | Status: AC
  Administered 2016-07-01: 4 mg via INTRAVENOUS
  Filled 2016-07-01: qty 1

## 2016-07-01 MED ORDER — POLYETHYLENE GLYCOL 3350 17 G PO PACK: 17.0000 g | PACK | Freq: Every day | ORAL | Status: DC | PRN

## 2016-07-01 MED ORDER — MIRTAZAPINE 15 MG PO TABS
30.0000 mg | ORAL_TABLET | Freq: Every day | ORAL | Status: DC
  Administered 2016-07-01 – 2016-07-04 (×4): 30 mg via ORAL
  Filled 2016-07-01 (×4): qty 2

## 2016-07-01 MED ORDER — FENTANYL CITRATE (PF) 100 MCG/2ML IJ SOLN
50.0000 ug | INTRAMUSCULAR | Status: AC | PRN
  Administered 2016-07-01 (×2): 50 ug via INTRAVENOUS
  Filled 2016-07-01 (×2): qty 2

## 2016-07-01 MED ORDER — HYDROMORPHONE HCL 1 MG/ML IJ SOLN
2.0000 mg | INTRAMUSCULAR | Status: DC | PRN
  Administered 2016-07-04 – 2016-07-05 (×7): 2 mg via INTRAVENOUS
  Filled 2016-07-01 (×7): qty 2

## 2016-07-01 MED ORDER — ONDANSETRON HCL 4 MG/2ML IJ SOLN
4.0000 mg | Freq: Once | INTRAMUSCULAR | Status: AC
  Administered 2016-07-01: 4 mg via INTRAVENOUS
  Filled 2016-07-01: qty 2

## 2016-07-01 MED ORDER — DEXTROSE 5 % IV SOLN
500.0000 mg | INTRAVENOUS | Status: DC
  Administered 2016-07-02 – 2016-07-05 (×4): 500 mg via INTRAVENOUS
  Filled 2016-07-01 (×6): qty 500

## 2016-07-01 MED ORDER — SODIUM CHLORIDE 0.9% FLUSH
3.0000 mL | Freq: Two times a day (BID) | INTRAVENOUS | Status: DC
  Administered 2016-07-01 – 2016-07-05 (×8): 3 mL via INTRAVENOUS

## 2016-07-01 MED ORDER — POTASSIUM CHLORIDE CRYS ER 20 MEQ PO TBCR
20.0000 meq | EXTENDED_RELEASE_TABLET | Freq: Two times a day (BID) | ORAL | Status: DC
  Administered 2016-07-01 – 2016-07-05 (×8): 20 meq via ORAL
  Filled 2016-07-01 (×9): qty 1

## 2016-07-01 MED ORDER — SENNOSIDES-DOCUSATE SODIUM 8.6-50 MG PO TABS
1.0000 | ORAL_TABLET | Freq: Two times a day (BID) | ORAL | Status: DC
  Administered 2016-07-01 – 2016-07-05 (×8): 1 via ORAL
  Filled 2016-07-01 (×8): qty 1

## 2016-07-01 MED ORDER — HYDROMORPHONE HCL 2 MG PO TABS
2.0000 mg | ORAL_TABLET | ORAL | Status: DC | PRN
  Administered 2016-07-02 – 2016-07-03 (×3): 2 mg via ORAL
  Filled 2016-07-01 (×3): qty 1

## 2016-07-01 MED ORDER — BENZONATATE 100 MG PO CAPS
200.0000 mg | ORAL_CAPSULE | Freq: Three times a day (TID) | ORAL | Status: AC
  Administered 2016-07-01 – 2016-07-03 (×6): 200 mg via ORAL
  Filled 2016-07-01 (×6): qty 2

## 2016-07-01 MED ORDER — SODIUM CHLORIDE 0.9 % IV SOLN
30.0000 meq | Freq: Once | INTRAVENOUS | Status: AC
  Administered 2016-07-01: 30 meq via INTRAVENOUS
  Filled 2016-07-01: qty 15

## 2016-07-01 MED ORDER — CEFTRIAXONE SODIUM-DEXTROSE 1-3.74 GM-% IV SOLR
1.0000 g | INTRAVENOUS | Status: DC
  Administered 2016-07-02 – 2016-07-05 (×4): 1 g via INTRAVENOUS
  Filled 2016-07-01 (×5): qty 50

## 2016-07-01 MED ORDER — ENOXAPARIN SODIUM 100 MG/ML ~~LOC~~ SOLN
100.0000 mg | Freq: Two times a day (BID) | SUBCUTANEOUS | Status: DC
  Administered 2016-07-01 – 2016-07-02 (×2): 100 mg via SUBCUTANEOUS
  Filled 2016-07-01 (×2): qty 1

## 2016-07-01 MED ORDER — ONDANSETRON HCL 4 MG PO TABS
4.0000 mg | ORAL_TABLET | Freq: Four times a day (QID) | ORAL | Status: DC | PRN
  Administered 2016-07-01: 4 mg via ORAL
  Filled 2016-07-01: qty 1

## 2016-07-01 MED ORDER — SODIUM CHLORIDE 0.9 % IV SOLN
INTRAVENOUS | Status: DC
  Administered 2016-07-01: 23:00:00 via INTRAVENOUS

## 2016-07-01 MED ORDER — ACETAMINOPHEN 325 MG PO TABS: 650.0000 mg | ORAL_TABLET | Freq: Four times a day (QID) | ORAL | Status: DC | PRN

## 2016-07-01 MED ORDER — METHADONE HCL 10 MG PO TABS
10.0000 mg | ORAL_TABLET | Freq: Two times a day (BID) | ORAL | Status: DC
  Administered 2016-07-01 – 2016-07-05 (×8): 10 mg via ORAL
  Filled 2016-07-01 (×8): qty 1

## 2016-07-01 MED ORDER — ONDANSETRON HCL 4 MG/2ML IJ SOLN: 4.0000 mg | Freq: Four times a day (QID) | INTRAMUSCULAR | Status: DC | PRN

## 2016-07-01 MED ORDER — LEVOFLOXACIN IN D5W 750 MG/150ML IV SOLN
750.0000 mg | Freq: Once | INTRAVENOUS | Status: AC
  Administered 2016-07-01: 750 mg via INTRAVENOUS
  Filled 2016-07-01: qty 150

## 2016-07-01 MED ORDER — HYDROMORPHONE HCL 1 MG/ML IJ SOLN
1.0000 mg | INTRAMUSCULAR | Status: AC
  Administered 2016-07-01: 1 mg via INTRAVENOUS
  Filled 2016-07-01: qty 1

## 2016-07-01 NOTE — ED Notes (Signed)
Pt noted to have rash on top of right hand extending into thumb after given the morphine - pt denies itching or shortness of breath - rash not noted anywhere else - Dr Kerman Passey observed rash - no new orders at this time

## 2016-07-01 NOTE — H&P (Signed)
[place] [facility] [place] NAME: Alexander [name]    MR#:  [id number]  DATE OF BIRTH:  Dec 08, 1955  DATE OF ADMISSION:  07/01/2016  PRIMARY CARE PHYSICIAN: Perrin Maltese, MD   REQUESTING/REFERRING PHYSICIAN: Dr. Harvest Dark  CHIEF COMPLAINT:   Chief Complaint  Patient presents with  . Shortness of Breath    HISTORY OF PRESENT ILLNESS:  Alexander [name]  is a 60 y.o. male with a known history of COPD not on oxygen, Mantle cell lymphoma metastatic, Candida esophagitis, GERD, BPH presents from home secondary to worsening left-sided chest pain and also shortness of breath for almost 2 weeks now. Symptoms started with cold congestion and sore throat. Gradually developed into worsening shortness of breath and also cough. Patient has been having pleuritic left-sided chest pain for 2 weeks now. His wife has been requesting him to come to the emergency room but patient has been waiting to see if it would get better. He complains of subjective fevers but no chills. No nausea or vomiting. His eyes started to get more red and teary. He went to see his PCP today and at the office they did an x-ray which showed pneumonia and so sent to the emergency room. Patient seems to be in significant pain here in spite of dilaudid IV and fentanyl. He has high tolerance for pain medications as he is on oral Dilaudid and methadone as outpatient. He has metastatic Mantle cell lymphoma recurrence and family has decided to go with no treatment as recommended by their oncologist because he is in no condition to withstand further treatment.  PAST MEDICAL HISTORY:   Past Medical History:  Diagnosis Date  . Asthma   . BPH (benign prostatic hyperplasia)   . COPD (chronic obstructive pulmonary disease) ([place])   . Deviated nasal septum   . DVT (deep venous thrombosis) ([place])   . Erosive esophagitis   . GERD (gastroesophageal reflux disease)   . Mantle cell lymphoma ([place])   .  Migraine     PAST SURGICAL HISTORY:   Past Surgical History:  Procedure Laterality Date  . barryx N/A   . ESOPHAGOGASTRODUODENOSCOPY (EGD) WITH PROPOFOL N/A 02/23/2015   Procedure: ESOPHAGOGASTRODUODENOSCOPY (EGD) WITH PROPOFOL;  Surgeon: Hulen Luster, MD;  Location[facility] ENDOSCOPY;  Service: Gastroenterology;  Laterality: N/A;  . ESOPHAGOGASTRODUODENOSCOPY (EGD) WITH PROPOFOL N/A 05/04/2015   Procedure: ESOPHAGOGASTRODUODENOSCOPY (EGD) WITH PROPOFOL;  Surgeon: Hulen Luster, MD;  Location: [facility] ENDOSCOPY;  Service: Gastroenterology;  Laterality: N/A;  . ESOPHAGOGASTRODUODENOSCOPY (EGD) WITH PROPOFOL N/A 07/27/2015   Procedure: ESOPHAGOGASTRODUODENOSCOPY (EGD) WITH PROPOFOL;  Surgeon: Hulen Luster, MD;  Location: [facility] ENDOSCOPY;  Service: Gastroenterology;  Laterality: N/A;  . ESOPHAGOGASTRODUODENOSCOPY (EGD) WITH PROPOFOL N/A 10/05/2015   Procedure: ESOPHAGOGASTRODUODENOSCOPY (EGD) WITH PROPOFOL;  Surgeon: Hulen Luster, MD;  Location: [facility] ENDOSCOPY;  Service: Gastroenterology;  Laterality: N/A;  . ESOPHAGOGASTRODUODENOSCOPY (EGD) WITH PROPOFOL N/A 01/04/2016   Procedure: ESOPHAGOGASTRODUODENOSCOPY (EGD) WITH PROPOFOL;  Surgeon: Hulen Luster, MD;  Location: [facility] ENDOSCOPY;  Service: Gastroenterology;  Laterality: N/A;  . NASAL SEPTUM SURGERY    . THROAT SURGERY    . TONSILLECTOMY    . UPPER ESOPHAGEAL ENDOSCOPIC ULTRASOUND (EUS) N/A 10/19/2015   Procedure: UPPER ESOPHAGEAL ENDOSCOPIC ULTRASOUND (EUS);  Surgeon: Holly Bodily, MD;  Location: [facility] ENDOSCOPY;  Service: Gastroenterology;  Laterality: N/A;    SOCIAL HISTORY:   Social History  Substance Use Topics  . Smoking status: Former Research scientist (life sciences)  . Smokeless tobacco: Never Used  .  Alcohol use No    FAMILY HISTORY:   Family History  Problem Relation Age of Onset  . Mesothelioma Father   . Breast cancer Mother     DRUG ALLERGIES:   Allergies  Allergen Reactions  . Bee Venom Shortness Of Breath and Swelling  . Ativan [Lorazepam] Other (See  Comments)    Reaction:  Dizziness     REVIEW OF SYSTEMS:   Review of Systems  Constitutional: Positive for malaise/fatigue and weight loss. Negative for chills and fever.  HENT: Negative for ear discharge, ear pain, hearing loss and nosebleeds.   Eyes: Negative for blurred vision, double vision and photophobia.  Respiratory: Positive for cough, shortness of breath and wheezing. Negative for hemoptysis.   Cardiovascular: Positive for chest pain. Negative for palpitations, orthopnea and leg swelling.  Gastrointestinal: Positive for nausea. Negative for abdominal pain, constipation, diarrhea, heartburn, melena and vomiting.  Genitourinary: Negative for dysuria, frequency and urgency.  Musculoskeletal: Positive for back pain and myalgias. Negative for neck pain.  Skin: Negative for rash.  Neurological: Negative for dizziness, tremors, sensory change, speech change, focal weakness and headaches.  Endo/Heme/Allergies: Does not bruise/bleed easily.  Psychiatric/Behavioral: Negative for depression.    MEDICATIONS AT HOME:   Prior to Admission medications   Medication Sig Start Date End Date Taking? Authorizing Provider  albuterol (PROAIR HFA) 108 (90 Base) MCG/ACT inhaler Inhale 1-2 puffs into the lungs every 4 (four) hours as needed for wheezing or shortness of breath.    Yes Historical Provider, MD  budesonide-formoterol (SYMBICORT) 80-4.5 MCG/ACT inhaler Inhale 2 puffs into the lungs 2 (two) times daily.   Yes Historical Provider, MD  enoxaparin (LOVENOX) 100 MG/ML injection Inject 100 mg into the skin every 12 (twelve) hours.   Yes Historical Provider, MD  fluconazole (DIFLUCAN) 100 MG tablet Take 100 mg by mouth daily.   Yes Historical Provider, MD  HYDROmorphone (DILAUDID) 4 MG tablet Take by mouth every 4 (four) hours as needed for severe pain.   Yes Historical Provider, MD  loperamide (IMODIUM) 2 MG capsule Take 2 mg by mouth as needed for diarrhea or loose stools.   Yes Historical  Provider, MD  methadone (DOLOPHINE) 5 MG tablet Take 10 mg by mouth 2 (two) times daily as needed.   Yes Historical Provider, MD  mirtazapine (REMERON) 30 MG tablet Take 30 mg by mouth at bedtime.   Yes Historical Provider, MD  potassium chloride SA (K-DUR,KLOR-CON) 20 MEQ tablet Take 20 mEq by mouth 2 (two) times daily.   Yes Historical Provider, MD  promethazine (PHENERGAN) 25 MG tablet Take 1 tablet (25 mg total) by mouth every 6 (six) hours as needed for nausea or vomiting. 09/30/15  Yes Fredia Sorrow, MD  senna-docusate (SENOKOT-S) 8.6-50 MG tablet Take 1 tablet by mouth 2 (two) times daily.   Yes Historical Provider, MD  tamsulosin (FLOMAX) 0.4 MG CAPS capsule Take 0.4 mg by mouth daily.   Yes Historical Provider, MD  diazepam (VALIUM) 5 MG tablet Take 1 tablet (5 mg total) by mouth every 8 (eight) hours as needed for anxiety. Patient not taking: Reported on 07/01/2016 12/28/15   Earleen Newport, MD  fluticasone [facility]) 50 MCG/ACT nasal spray Place 2 sprays into both nostrils daily. Patient not taking: Reported on 07/01/2016 10/04/15   Nicholes Mango, MD  potassium chloride (K-DUR) 10 MEQ tablet Take 1 tablet (10 mEq total) by mouth 2 (two) times daily. Patient not taking: Reported on 07/01/2016 09/30/15   Fredia Sorrow, MD  predniSONE (  DELTASONE) 20 MG tablet Take 3 tablets (60 mg total) by mouth daily. Patient not taking: Reported on 07/01/2016 04/02/16   Loney Hering, MD      VITAL SIGNS:  Blood pressure (!) 156/75, pulse 96, temperature 98.6 F (37 C), temperature source Oral, resp. rate 10, height 5\' 10"  (1.778 m), weight 83 kg (183 lb), SpO2 98 %.  PHYSICAL EXAMINATION:   Physical Exam  GENERAL:  60 y.o.-year-old patient lying in the bed And seems to be in distress due to significant left sided pain.  EYES: Pupils equal, round, reactive to light and accommodation. No scleral icterus. Conjunctival erythema noted. Extraocular muscles intact.  HEENT: Head atraumatic,  normocephalic. Oropharynx and nasopharynx clear.  NECK:  Supple, no jugular venous distention. No thyroid enlargement, no tenderness.  LUNGS: Normal breath sounds bilaterally, no wheezing, rales,rhonchi or crepitation. No use of accessory muscles of respiration. Diminished bibasilar breath sounds. Left basilar rhonchi noted. CARDIOVASCULAR: S1, S2 normal. No murmurs, rubs, or gallops.  ABDOMEN: Soft, nontender, nondistended. Bowel sounds present. No organomegaly or mass.  EXTREMITIES: No pedal edema, cyanosis, or clubbing.  NEUROLOGIC: Cranial nerves II through XII are intact. Muscle strength 5/5 in all extremities. Sensation intact. Gait not checked. Global weakness noted. PSYCHIATRIC: The patient is alert and oriented x 3.  SKIN: No obvious rash, lesion, or ulcer.   LABORATORY PANEL:   CBC  Recent Labs Lab 07/01/16 1537  WBC 15.3*  HGB 13.5  HCT 39.3*  PLT 236   ------------------------------------------------------------------------------------------------------------------  Chemistries   Recent Labs Lab 07/01/16 1537  NA 140  K 2.8*  CL 98*  CO2 29  GLUCOSE 119*  BUN 7  CREATININE 0.58*  CALCIUM 8.9  AST 33  ALT 23  ALKPHOS 101  BILITOT 0.8   ------------------------------------------------------------------------------------------------------------------  Cardiac Enzymes  Recent Labs Lab 07/01/16 1537  TROPONINI <0.03   ------------------------------------------------------------------------------------------------------------------  RADIOLOGY:  Dg Chest 2 View  Result Date: 07/01/2016 CLINICAL DATA:  Two weeks of shortness of breath and back soreness. History of COPD and asthma. Lymphoma. Former smoker. EXAM: CHEST  2 VIEW COMPARISON:  PA and lateral chest x-ray of April 10, 2016 FINDINGS: There is patchy infiltrate in the left lower lobe. There is a stable calcified nodule in the right upper lobe. There is no pleural effusion or pneumothorax. The heart  and pulmonary vascularity are normal. The mediastinum is normal in width. The bony thorax exhibits no acute abnormality. IMPRESSION: Patchy pneumonia in the left lower lobe. Followup PA and lateral chest X-ray is recommended in 3-4 weeks following trial of antibiotic therapy to ensure resolution and exclude underlying malignancy. No CHF nor other acute cardiopulmonary abnormality. Previous granulomatous infection. Electronically Signed   By: David  Martinique M.D.   On: 07/01/2016 16:04    EKG:   Orders placed or performed during the hospital encounter of 07/01/16  . ED EKG  . ED EKG    IMPRESSION AND PLAN:   Alexander [name]  is a 60 y.o. male with a known history of COPD not on oxygen, Mantle cell lymphoma metastatic, Candida esophagitis, GERD, BPH presents from home secondary to worsening left-sided chest pain and also shortness of breath for almost 2 weeks now.  #1 left lower lobe pneumonia-community acquired. Blood cultures ordered. -Wbc is elevated. Started on Rocephin and azithromycin. -Continue oxygen support as needed.  #2 hypokalemia-has chronic hypokalemia and is being replaced. Monitor as IV doses given  #3 pleurisy-significant left-sided pleuritic chest pain, likely from pneumonia. High tolerance to pain medications. -  Continue oral Dilaudid and methadone which are his outpatient medications. -Started IV Dilaudid for severe pain  #4 metastatic Mantle cell lymphoma-follows at the [facility]. Has been in remission until recently with new recurrence. Poor candidate for further treatment and currently decided not to undergo any treatment. -Has significant bone pain and takes methadone, order Dilaudid as outpatient. -Laxatives added  #5 history of DVT-and right lower extremity. Unable to tolerate oral anticoagulants. Takes Lovenox at home. Continue Lovenox.  #6 depression-continue home medications. Patient on Remeron  Physical therapy has been consulted    All the records are  reviewed and case discussed with ED provider. Management plans discussed with the patient, family and they are in agreement.  CODE STATUS: DNR- discussed with patient and wife at bedside  TOTAL TIME TAKING CARE OF THIS PATIENT: 50 minutes.    Gladstone Lighter M.D on 07/01/2016 at 7:30 PM  Between 7am to 6pm - Pager - [phone]  After 6pm go to [email or web address] - password [email or web address] [place] [facility]  Office  [phone]  CC: Primary care physician; Perrin Maltese, MD

## 2016-07-01 NOTE — ED Provider Notes (Signed)
[facility] Emergency Department Provider Note  Time seen: 6:15 PM  I have reviewed the triage vital signs and the nursing notes.   HISTORY  Chief Complaint Shortness of Breath    HPI Alexander [name] is a 60 y.o. male With a past medical history Mantle cell lymphoma, COPD, asthma, who press the emergency department left-sided cheiculty breathing. According to the patient the past 2 weeks he has ha worse left-sided chest pain, especially with deep breathing. Patient states he was having a lot of difficulty sleeping last night due to the discomfort and trouble breathing, he went to his primary care doctor today.Primary care doctor ordered labs and perform a chest x-ray which showed a left-sided pneumonia. Patient was sent to the emergency department for further treatment and likely admission. Patient states fever at home.   Past Medical History:  Diagnosis Date  . Asthma   . BPH (benign prostatic hyperplasia)   . COPD (chronic obstructive pulmonary disease) ([place])   . Deviated nasal septum   . DVT (deep venous thrombosis) ([place])   . Erosive esophagitis   . GERD (gastroesophageal reflux disease)   . Mantle cell lymphoma ([place])   . Migraine     Patient Active Problem List   Diagnosis Date Noted  . Acute encephalopathy 10/02/2015  . COPD (chronic obstructive pulmonary disease) ([place]) 10/02/2015  . GERD (gastroesophageal reflux disease) 10/02/2015  . BPH (benign prostatic hyperplasia) 10/02/2015  . History of DVT (deep vein thrombosis) 10/02/2015  . Orthostatic hypotension 09/28/2015  . Vasovagal syncope 09/28/2015  . Deep vein thrombosis (DVT) of distal vein of lower extremity ([place]) 09/28/2015  . Hypokalemia 09/25/2015  . Gastroenteritis, acute 09/25/2015  . Gastroenteritis 09/25/2015  . Pressure ulcer 08/18/2015  . HCAP (healthcare-associated pneumonia) 08/17/2015  . Syncope and collapse 08/10/2015  . Syncope 08/10/2015  . Protein-calorie malnutrition,  severe 08/10/2015  . Abdominal pain 07/17/2015  . Nausea & vomiting 07/17/2015  . Malnutrition of moderate degree 07/17/2015  . Diarrhea 05/18/2015  . DEVIATED SEPTUM 05/18/2007  . ALLERGIC RHINITIS 05/18/2007  . ASTHMA 05/18/2007    Past Surgical History:  Procedure Laterality Date  . barryx N/A   . ESOPHAGOGASTRODUODENOSCOPY (EGD) WITH PROPOFOL N/A 02/23/2015   Procedure: ESOPHAGOGASTRODUODENOSCOPY (EGD) WITH PROPOFOL;  Surgeon: Hulen Luster, MD;  Location: [facility] ENDOSCOPY;  Service: Gastroenterology;  Laterality: N/A;  . ESOPHAGOGASTRODUODENOSCOPY (EGD) WITH PROPOFOL N/A 05/04/2015   Procedure: ESOPHAGOGASTRODUODENOSCOPY (EGD) WITH PROPOFOL;  Surgeon: Hulen Luster, MD;  Location: [facility] ENDOSCOPY;  Service: Gastroenterology;  Laterality: N/A;  . ESOPHAGOGASTRODUODENOSCOPY (EGD) WITH PROPOFOL N/A 07/27/2015   Procedure: ESOPHAGOGASTRODUODENOSCOPY (EGD) WITH PROPOFOL;  Surgeon: Hulen Luster, MD;  Location: [facility] ENDOSCOPY;  Service: Gastroenterology;  Laterality: N/A;  . ESOPHAGOGASTRODUODENOSCOPY (EGD) WITH PROPOFOL N/A 10/05/2015   Procedure: ESOPHAGOGASTRODUODENOSCOPY (EGD) WITH PROPOFOL;  Surgeon: Hulen Luster, MD;  Location: [facility] ENDOSCOPY;  Service: Gastroenterology;  Laterality: N/A;  . ESOPHAGOGASTRODUODENOSCOPY (EGD) WITH PROPOFOL N/A 01/04/2016   Procedure: ESOPHAGOGASTRODUODENOSCOPY (EGD) WITH PROPOFOL;  Surgeon: Hulen Luster, MD;  Location: [facility] ENDOSCOPY;  Service: Gastroenterology;  Laterality: N/A;  . NASAL SEPTUM SURGERY    . THROAT SURGERY    . TONSILLECTOMY    . UPPER ESOPHAGEAL ENDOSCOPIC ULTRASOUND (EUS) N/A 10/19/2015   Procedure: UPPER ESOPHAGEAL ENDOSCOPIC ULTRASOUND (EUS);  Surgeon: Holly Bodily, MD;  Location: [facility] ENDOSCOPY;  Service: Gastroenterology;  Laterality: N/A;    Prior to Admission medications   Medication Sig Start Date End Date Taking? Authorizing Provider  acetaminophen (TYLENOL) 500  MG tablet Take 1,000 mg by mouth every 8 (eight) hours as needed for mild pain or  fever.    Historical Provider, MD  albuterol (PROAIR HFA) 108 (90 Base) MCG/ACT inhaler Inhale 1-2 puffs into the lungs every 4 (four) hours as needed for wheezing or shortness of breath.     Historical Provider, MD  diazepam (VALIUM) 5 MG tablet Take 1 tablet (5 mg total) by mouth every 8 (eight) hours as needed for anxiety. Patient not taking: Reported on 04/01/2016 12/28/15   Earleen Newport, MD  enoxaparin (LOVENOX) 100 MG/ML injection Inject 100 mg into the skin every 12 (twelve) hours.    Historical Provider, MD  fluticasone (FLONASE) 50 MCG/ACT nasal spray Place 2 sprays into both nostrils daily. Patient not taking: Reported on 04/01/2016 10/04/15   Nicholes Mango, MD  gabapentin (NEURONTIN) 300 MG capsule Take 300 mg by mouth 3 (three) times daily.    Historical Provider, MD  HYDROmorphone (DILAUDID) 4 MG tablet Take by mouth every 4 (four) hours as needed for severe pain.    Historical Provider, MD  loperamide (IMODIUM) 2 MG capsule Take 2 mg by mouth as needed for diarrhea or loose stools.    Historical Provider, MD  morphine (MS CONTIN) 30 MG 12 hr tablet Take 30 mg by mouth 3 (three) times daily.    Historical Provider, MD  nortriptyline (PAMELOR) 25 MG capsule Take 25 mg by mouth at bedtime.    Historical Provider, MD  omeprazole (PRILOSEC) 20 MG capsule Take 20 mg by mouth 2 (two) times daily.    Historical Provider, MD  potassium chloride (K-DUR) 10 MEQ tablet Take 1 tablet (10 mEq total) by mouth 2 (two) times daily. Patient not taking: Reported on 04/01/2016 09/30/15   Fredia Sorrow, MD  predniSONE (DELTASONE) 20 MG tablet Take 3 tablets (60 mg total) by mouth daily. 04/02/16   Loney Hering, MD  promethazine (PHENERGAN) 25 MG tablet Take 1 tablet (25 mg total) by mouth every 6 (six) hours as needed for nausea or vomiting. 09/30/15   Fredia Sorrow, MD  senna (SENOKOT) 8.6 MG TABS tablet Take 2 tablets by mouth 2 (two) times daily as needed for mild constipation.    Historical  Provider, MD  traZODone (DESYREL) 50 MG tablet Take 50 mg by mouth at bedtime.    Historical Provider, MD    Allergies  Allergen Reactions  . Bee Venom Shortness Of Breath and Swelling  . Ativan [Lorazepam] Other (See Comments)    Reaction:  Dizziness     Family History  Problem Relation Age of Onset  . Mesothelioma Father   . Breast cancer Mother     Social History Social History  Substance Use Topics  . Smoking status: Former Research scientist (life sciences)  . Smokeless tobacco: Never Used  . Alcohol use No    Review of Systems Constitutional: positive for fever at home Cardiovascular: left chest pain Respiratory: positive for shortness Gastrointestinal: Negative for abdominal pain Neurological: Negative for headache 10-point ROS otherwise negative.  ____________________________________________   PHYSICAL EXAM:  VITAL SIGNS: ED Triage Vitals  Enc Vitals Group     BP 07/01/16 1526 123/67     Pulse Rate 07/01/16 1526 96     Resp 07/01/16 1526 20     Temp 07/01/16 1526 98.6 F (37 C)     Temp Source 07/01/16 1526 Oral     SpO2 07/01/16 1526 98 %     Weight 07/01/16 1527 183 lb (83 kg)  Height 07/01/16 1527 5\' 10"  (1.778 m)     Head Circumference --      Peak Flow --      Pain Score 07/01/16 1527 10     Pain Loc --      Pain Edu? --      Excl. in [place]? --    Constitutional: Alert and oriented. Mild distress due to left-sided chest pain Eyes: Normal exam ENT   Head: Normocephalic and atraumatic   Mouth/Throat: Mucous membranes are moist. Cardiovascular: Normal rate, regular rhythm. No murmur Respiratory: Normal respiratory effort without tachypnea nor retractions. Breath sounds are clear  Gastrointestinal: Soft and nontender. No distention.   Musculoskeletal: Nontender with normal range of motion in all extremities. No lower extremity tenderness or edema. Neurologic:  Normal speech and language. No gross focal neurologic deficits Skin:  Skin is warm, dry and intact.   Psychiatric: Mood and affect are normal.  ____________________________________________    EKG  EKG reviewed and interpreted by myself shows sinus rhythm 87 bpm, narrow QRS, normal axis, normal intervals, nonspecific but no concerning ST changes.  ____________________________________________    RADIOLOGY  Chest x-ray shows patchy pneumonia in the left lower lobe.  ____________________________________________   INITIAL IMPRESSION / ASSESSMENT AND PLAN / ED COURSE  Pertinent labs & imaging results that were available during my care of the patient were reviewed by me and considered in my medical decision making (see chart for details).  Patient presents for left-sided chest pain and trouble breathing. Patient's x-ray is consistent with left lower lobe pneumonia. Patient vitals are largely within normal limits. Labs show a significant leukocytosis of 15,000. I discussed the options with the patient's father's home treatment versus admission. Patient states due to the chest pain and trouble breathing he would much prefer to be admitted to the hospital. Patient will be admitted for further treatment.  ____________________________________________   FINAL CLINICAL IMPRESSION(S) / ED DIAGNOSES  Pneumonia Chest pain    Harvest Dark, MD 07/01/16 1819

## 2016-07-01 NOTE — ED Triage Notes (Signed)
SOB x 2 weeks, states went to doctor and they did xray in office and told him he has pneumonia. Resp not labored in triage but back sore, SAT 99 room air.

## 2016-07-01 NOTE — Progress Notes (Signed)
Pharmacy Antibiotic Note  Alexander [name] is a 60 y.o. male admitted on 07/01/2016 with  pneumonia.  Pharmacy has been consulted for ceftriaxone dosing. Patient received 1 dose levofloxacin in ED. Patient receiving azithromycin daily now.   Plan: Will start patient on ceftriaxone 1gm iv every 24 hours.   Height: 5\' 10"  (177.8 cm) Weight: 183 lb (83 kg) IBW/kg (Calculated) : 73  Temp (24hrs), Avg:98.6 F (37 C), Min:98.6 F (37 C), Max:98.6 F (37 C)   Recent Labs Lab 07/01/16 1537  WBC 15.3*  CREATININE 0.58*    Estimated Creatinine Clearance: 98.9 mL/min (by C-G formula based on SCr of 0.58 mg/dL (L)).    Allergies  Allergen Reactions  . Bee Venom Shortness Of Breath and Swelling  . Ativan [Lorazepam] Other (See Comments)    Reaction:  Dizziness     Antimicrobials this admission: 11/13 levofloxacin >> 11/13 11/14 ceftriaxone >> 11/14 Azithromycin >>   Dose adjustments this admission:   Microbiology results: BCx:  UCx:  Sputum:    MRSA PCR:   Thank you for allowing pharmacy to be a part of this patient's care.  Pernell Dupre, PharmD Clinical Pharmacist  07/01/2016 8:14 PM

## 2016-07-01 NOTE — ED Notes (Signed)
Pt reports that he thinks he has pneumonia - Dr Humphrey Rolls told the family that he had pneumonia in his left lung and that it was to severe to be treated at home with atb's and that he would need to be admitted - pt 9/10 pain all over - pt reports shortness of breath at all times - pt reports off and on cough with periods of productive cough (pt did not asses sputum to give details)

## 2016-07-01 NOTE — Plan of Care (Signed)
Problem: Physical Regulation: Goal: Ability to maintain clinical measurements within normal limits will improve Outcome: Not Progressing Encourage frequent meals and activity when pain is at a level 5.

## 2016-07-02 LAB — CBC
HEMATOCRIT: 31.2 % — AB (ref 40.0–52.0)
Hemoglobin: 10.5 g/dL — ABNORMAL LOW (ref 13.0–18.0)
MCH: 29.4 pg (ref 26.0–34.0)
MCHC: 33.7 g/dL (ref 32.0–36.0)
MCV: 87.3 fL (ref 80.0–100.0)
Platelets: 172 10*3/uL (ref 150–440)
RBC: 3.58 MIL/uL — ABNORMAL LOW (ref 4.40–5.90)
RDW: 13.5 % (ref 11.5–14.5)
WBC: 11.6 10*3/uL — ABNORMAL HIGH (ref 3.8–10.6)

## 2016-07-02 LAB — BASIC METABOLIC PANEL
Anion gap: 6 (ref 5–15)
BUN: 6 mg/dL (ref 6–20)
CO2: 31 mmol/L (ref 22–32)
Calcium: 7.6 mg/dL — ABNORMAL LOW (ref 8.9–10.3)
Chloride: 104 mmol/L (ref 101–111)
Creatinine, Ser: 0.59 mg/dL — ABNORMAL LOW (ref 0.61–1.24)
GFR calc Af Amer: >60 mL/min (ref >60)
GLUCOSE: 217 mg/dL — AB (ref 65–99)
POTASSIUM: 2.4 mmol/L — AB (ref 3.5–5.1)
Sodium: 141 mmol/L (ref 135–145)

## 2016-07-02 LAB — POTASSIUM
Potassium: 2.8 mmol/L — ABNORMAL LOW (ref 3.5–5.1)
Potassium: 3.1 mmol/L — ABNORMAL LOW (ref 3.5–5.1)

## 2016-07-02 LAB — TROPONIN I
Troponin I: <0.03 ng/mL (ref <0.03)
Troponin I: <0.03 ng/mL (ref <0.03)

## 2016-07-02 LAB — MAGNESIUM: MAGNESIUM: 1.8 mg/dL (ref 1.7–2.4)

## 2016-07-02 MED ORDER — POTASSIUM CHLORIDE IN NACL 20-0.9 MEQ/L-% IV SOLN
INTRAVENOUS | Status: DC
  Administered 2016-07-02 – 2016-07-03 (×4): via INTRAVENOUS
  Filled 2016-07-02 (×6): qty 1000

## 2016-07-02 MED ORDER — BOOST / RESOURCE BREEZE PO LIQD
1.0000 | Freq: Three times a day (TID) | ORAL | Status: DC
  Administered 2016-07-02 – 2016-07-05 (×10): 1 via ORAL

## 2016-07-02 MED ORDER — IPRATROPIUM-ALBUTEROL 0.5-2.5 (3) MG/3ML IN SOLN
3.0000 mL | RESPIRATORY_TRACT | Status: DC
  Administered 2016-07-02 – 2016-07-05 (×18): 3 mL via RESPIRATORY_TRACT
  Filled 2016-07-02 (×18): qty 3

## 2016-07-02 MED ORDER — NITROGLYCERIN 0.4 MG SL SUBL
0.4000 mg | SUBLINGUAL_TABLET | Freq: Once | SUBLINGUAL | Status: AC
  Administered 2016-07-02: 0.4 mg via SUBLINGUAL
  Filled 2016-07-02: qty 1

## 2016-07-02 MED ORDER — ENOXAPARIN SODIUM 100 MG/ML ~~LOC~~ SOLN
1.0000 mg/kg | Freq: Two times a day (BID) | SUBCUTANEOUS | Status: DC
  Administered 2016-07-02 – 2016-07-05 (×6): 85 mg via SUBCUTANEOUS
  Filled 2016-07-02 (×6): qty 1

## 2016-07-02 MED ORDER — SALINE SPRAY 0.65 % NA SOLN
2.0000 | NASAL | Status: DC | PRN
  Filled 2016-07-02: qty 44

## 2016-07-02 MED ORDER — GUAIFENESIN ER 600 MG PO TB12
600.0000 mg | ORAL_TABLET | Freq: Two times a day (BID) | ORAL | Status: DC
  Administered 2016-07-02 – 2016-07-05 (×7): 600 mg via ORAL
  Filled 2016-07-02 (×7): qty 1

## 2016-07-02 MED ORDER — POTASSIUM CHLORIDE CRYS ER 20 MEQ PO TBCR
40.0000 meq | EXTENDED_RELEASE_TABLET | Freq: Once | ORAL | Status: AC
  Administered 2016-07-02: 40 meq via ORAL

## 2016-07-02 MED ORDER — SODIUM CHLORIDE 0.9 % IV SOLN
30.0000 meq | Freq: Once | INTRAVENOUS | Status: AC
  Administered 2016-07-02: 30 meq via INTRAVENOUS
  Filled 2016-07-02: qty 15

## 2016-07-02 NOTE — Progress Notes (Signed)
[facility] NAME: Alexander [name]    MR#:  [id number]  DATE OF BIRTH:  1956-06-21  SUBJECTIVE:  CHIEF COMPLAINT:   Chief Complaint  Patient presents with  . Shortness of Breath   The patient is 60 year old Caucasian male with past medical history significant for history of former tobacco abuse for 20 years, quit about 20 years ago, COPD, mantle cell lymphoma, metastatic,  gastro-esophageal reflux disease, BPH, who presents to the hospital with complaints of chest pain, left-sided, shortness of breath, cold symptoms, cough, some sputum production, thick, difficult to lift it up. He felt feverish and chilly, weak, in the hospital. He was noted to have left lower lobe pneumonia and admitted. He is off oxygen now, O2 sats are 94% on room air. He feels a little bit better today   Review of Systems  Constitutional: Negative for chills, fever and weight loss.  HENT: Negative for congestion.   Eyes: Negative for blurred vision and double vision.  Respiratory: Positive for cough and shortness of breath. Negative for sputum production and wheezing.   Cardiovascular: Positive for chest pain. Negative for palpitations, orthopnea, leg swelling and PND.  Gastrointestinal: Negative for abdominal pain, blood in stool, constipation, diarrhea, nausea and vomiting.  Genitourinary: Negative for dysuria, frequency, hematuria and urgency.  Musculoskeletal: Negative for falls.  Neurological: Negative for dizziness, tremors, focal weakness and headaches.  Endo/Heme/Allergies: Does not bruise/bleed easily.  Psychiatric/Behavioral: Negative for depression. The patient does not have insomnia.     VITAL SIGNS: Blood pressure 131/62, pulse 66, temperature 98.5 F (36.9 C), temperature source Oral, resp. rate 16, height 5\' 10"  (1.778 m), weight 83.8 kg (184 lb 11.2 oz), SpO2 94 %.  PHYSICAL EXAMINATION:   GENERAL:  60 y.o.-year-old patient lying in the  bed with no acute distress.  EYES: Pupils equal, round, reactive to light and accommodation. No scleral icterus. Extraocular muscles intact.  HEENT: Head atraumatic, normocephalic. Oropharynx and nasopharynx clear.  NECK:  Supple, no jugular venous distention. No thyroid enlargement, no tenderness.  LUNGS: Normal breath souon the right posteriorly, crackles, rales and rhonchi on the left posteriorly, some diminished breath sounds, intermittent wheezes. No use of accessory muscles of respiration.  CARDIOVASCULAR: S1, S2 normal. No murmurs, rubs, or gallops.  ABDOMEN: Soft, nontender, nondistended. Bowel sounds present. No organomegaly or mass.  EXTREMITIES: No pedal edema, cyanosis, or clubbing.  NEUROLOGIC: Cranial nerves II through XII are intact. Muscle strength 5/5 in all extremities. Sensation intact. Gait not checked.  PSYCHIATRIC: The patient is alert and oriented x 3.  SKIN: No obvious rash, lesion, or ulcer.   ORDERS/RESULTS REVIEWED:   CBC  Recent Labs Lab 07/01/16 1537 07/02/16 0134  WBC 15.3* 11.6*  HGB 13.5 10.5*  HCT 39.3* 31.2*  PLT 236 172  MCV 86.9 87.3  MCH 29.8 29.4  MCHC 34.3 33.7  RDW 13.7 13.5  LYMPHSABS 8.3*  --   MONOABS 0.8  --   EOSABS 0.2  --   BASOSABS 0.2*  --    ------------------------------------------------------------------------------------------------------------------  Chemistries   Recent Labs Lab 07/01/16 1537 07/02/16 0134 07/02/16 0756  NA 140 141  --   K 2.8* 2.4* 2.8*  CL 98* 104  --   CO2 29 31  --   GLUCOSE 119* 217*  --   BUN 7 6  --   CREATININE 0.58* 0.59*  --   CALCIUM 8.9 7.6*  --   MG  --   --  1.8  AST 33  --   --   ALT 23  --   --   ALKPHOS 101  --   --   BILITOT 0.8  --   --    ------------------------------------------------------------------------------------------------------------------ estimated creatinine clearance is 98.9 mL/min (by C-G formula based on SCr of 0.59 mg/dL  (L)). ------------------------------------------------------------------------------------------------------------------ No results for input(s): TSH, T4TOTAL, T3FREE, THYROIDAB in the last 72 hours.  Invalid input(s): FREET3  Cardiac Enzymes  Recent Labs Lab 07/01/16 1537  TROPONINI <0.03   ------------------------------------------------------------------------------------------------------------------ Invalid input(s): POCBNP ---------------------------------------------------------------------------------------------------------------  RADIOLOGY: Dg Chest 2 View  Result Date: 07/01/2016 CLINICAL DATA:  Two weeks of shortness of breath and back soreness. History of COPD and asthma. Lymphoma. Former smoker. EXAM: CHEST  2 VIEW COMPARISON:  PA and lateral chest x-ray of April 10, 2016 FINDINGS: There is patchy infiltrate in the left lower lobe. There is a stable calcified nodule in the right upper lobe. There is no pleural effusion or pneumothorax. The heart and pulmonary vascularity are normal. The mediastinum is normal in width. The bony thorax exhibits no acute abnormality. IMPRESSION: Patchy pneumonia in the left lower lobe. Followup PA and lateral chest X-ray is recommended in 3-4 weeks following trial of antibiotic therapy to ensure resolution and exclude underlying malignancy. No CHF nor other acute cardiopulmonary abnormality. Previous granulomatous infection. Electronically Signed   By: David  Martinique M.D.   On: 07/01/2016 16:04    EKG:  Orders placed or performed during the hospital encounter of 01/26/15  . EKG 12-Lead    ASSESSMENT AND PLAN:  Active Problems:   Sepsis ([place]) #1. Sepsis due to left lower lobe pneumonia, continue broad-spectrum antibiotic therapy with Rocephin and Zithromax, get sputum cultures if possible.  #2. Left lower lobe pneumonia, continue antibiotics, get sputum cultures #3. Hypokalemia, supplementing intravenously, magnesium level was checked and  she was found to be borderline normal . #4. Leukocytosis, improving with antibiotic therapy #5 right lower extremity DVT, continue Lovenox at treatment doses   #6. Metastatic Mantle cell lymphoma, patient receives therapy at the [facility], continue outpatient pain medications #BPH, continue tamsulosin   Management plans discussed with the patient, family and they are in agreement.   DRUG ALLERGIES:  Allergies  Allergen Reactions  . Bee Venom Shortness Of Breath and Swelling  . Ativan [Lorazepam] Other (See Comments)    Reaction:  Dizziness     CODE STATUS:     Code Status Orders        Start     Ordered   07/01/16 2011  Do not attempt resuscitation (DNR)  Continuous    Question Answer Comment  In the event of cardiac or respiratory ARREST Do not call a "code blue"   In the event of cardiac or respiratory ARREST Do not perform Intubation, CPR, defibrillation or ACLS   In the event of cardiac or respiratory ARREST Use medication by any route, position, wound care, and other measures to relive pain and suffering. May use oxygen, suction and manual treatment of airway obstruction as needed for comfort.      07/01/16 2010    Code Status History    Date Active Date Inactive Code Status Order ID Comments User Context   10/02/2015 11:04 PM 10/04/2015  6:12 PM Full Code [id number]  Lance Coon, MD ED   09/25/2015  1:32 AM 09/26/2015  2:00 PM Full Code [id number]  Saundra Shelling, MD Inpatient   08/17/2015 10:03 PM 08/19/2015  2:23 PM Full Code [id number]  Gladstone Lighter, MD Inpatient   08/10/2015  8:28 AM 08/12/2015  1:40 PM Full Code [id number]  Saundra Shelling, MD Inpatient   07/17/2015  1:08 AM 07/19/2015  2:13 PM Full Code [id number]  Saundra Shelling, MD Inpatient   05/18/2015  1:29 PM 05/19/2015  3:02 PM Full Code [id number]  Dustin Flock, MD Inpatient    Advance Directive Documentation   Flowsheet Row Most Recent Value  Type of Advance Directive  Living will, Healthcare Power of  Attorney  Pre-existing out of facility DNR order (yellow form or pink MOST form)  No data  "MOST" Form in Place?  No data      TOTAL TIME TAKING CARE OF THIS PATIENT: . 35 minutes.   Emotional support was provided, all questions were answered  Dannisha Eckmann M.D on 07/02/2016 at 11:10 AM  Between 7am to 6pm - Pager - [phone]  After 6pm go to [email or web address] - password [email or web address] [facility]  Office  [phone]  CC: Primary care physician; Perrin Maltese, MD

## 2016-07-02 NOTE — Progress Notes (Signed)
Initial Nutrition Assessment  DOCUMENTATION CODES:   Severe malnutrition in context of chronic illness  INTERVENTION:  1. Boost Breeze po TID, each supplement provides 250 kcal and 9 grams of protein  NUTRITION DIAGNOSIS:   Malnutrition related to chronic illness as evidenced by moderate depletion of body fat, moderate depletions of muscle mass, percent weight loss, energy intake < or equal to 75% for > or equal to 1 month.  GOAL:   Patient will meet greater than or equal to 90% of their needs  MONITOR:   PO intake, Supplement acceptance, I & O's, Labs, Weight trends  REASON FOR ASSESSMENT:   Malnutrition Screening Tool    ASSESSMENT:   Alexander [name]  is a 60 y.o. male with a known history of COPD not on oxygen, Mantle cell lymphoma metastatic, Candida esophagitis, GERD, BPH presents from home secondary to worsening left-sided chest pain and also shortness of breath for almost 2 weeks now.  Spoke with Alexander [name] at bedside. He admits to poor PO intake over the past 3 months with concurrent 25# wt loss. States that he has been "sick to his stomach" - complains of nausea during this time that caused him to eat little to nothing during those 3 months.  Per chart he exhibits a 23#/11.1% severe wt loss over 2.75 months.  He feels pretty lousy today, flat affect, continues to complain of nausea. Of note he was recently found to have a recurrence of his metastatic mantle cell lymphoma by oncologist and decided go with no treatment.  This morning he had eggs, grits and toast, appears he ate ~50% of No documented meal completion thus far.   Nutrition-Focused physical exam completed. Findings are moderate fat depletion, moderate muscle depletion, and no edema.   Given patient's weight loss and reported <75% energy intake for > 1 month, he meets criteria for severe malnutrition in context of chronic illness.  Labs and medications reviewed: K 2.8 Senokot-S NS w/ K+ @  63mL/hr  Diet Order:  Diet regular Room service appropriate? Yes; Fluid consistency: Thin  Skin:  Reviewed, no issues  Last BM:  06/30/2016  Height:   Ht Readings from Last 1 Encounters:  07/01/16 5\' 10"  (1.778 m)    Weight:   Wt Readings from Last 1 Encounters:  07/01/16 184 lb 11.2 oz (83.8 kg)    Ideal Body Weight:  75.45 kg  BMI:  Body mass index is 26.5 kg/m.  Estimated Nutritional Needs:   Kcal:  2500-2800 calories (30-33 cal/kg)  Protein:  100-125 gm  Fluid:  >/= 2.5L  EDUCATION NEEDS:   No education needs identified at this time  Satira Anis. Paysley Poplar, MS, RD LDN Inpatient Clinical Dietitian Pager [phone]

## 2016-07-02 NOTE — Progress Notes (Signed)
Patient admitted on 11/13 at 2000. Patient arrived in pain due to chronic back pain. Electrolytes are being replaced orally and intravenously. Notified MD that potassium level dropped to critical levels at 0300. Patient is being encouraged to eat small frequent meals, patient is compliant. Patient is currently resting without any complications. Will continue to monitor and assess.

## 2016-07-02 NOTE — Progress Notes (Signed)
Patient complains of left side chest pain.  He says it is different that former pain, although he did have l side chest pain on admission.  SL nitroglycerin 0.4 mg given.  Troponins ordered.

## 2016-07-02 NOTE — Care Management (Addendum)
Patient admitted with sepsis due to multilobular pneumonia.  he has had a recent recurrence of mantle cell lymphoma after a 12 month remission.  He is followed at the [facility] and see his palliative care physician Dr Armandina Gemma the most. Has chronic home oxygen through [facility].  He signed a refusal to transfer form to the [facility] because of the distance he and his wife would have to travel.  He would be agreeable to have home health nurse to follow him at home if needed.  His primary payor listed for this visit is medicaid.  If patient required home health physical therapy, would have to get approval from the [facility] in order for it to be covered. Faxed form and H/P to [employer] at [facility]

## 2016-07-02 NOTE — Progress Notes (Signed)
PT Cancellation Note  Patient Details Name: DAYN SPEIRS MRN: [id number] DOB: 1955-11-06   Cancelled Treatment:      Pt refused PT eval this date secondary to fatigue and stated would attempt to participate on 07/03/16.  Of note pt's Ka was noted in chart to be chronically low and was 2.8 this date up from 2.4 07/01/16.   Blanche [place] Dirosa 07/02/2016, 10:25 AM

## 2016-07-03 LAB — MAGNESIUM: Magnesium: 1.9 mg/dL (ref 1.7–2.4)

## 2016-07-03 LAB — POTASSIUM: POTASSIUM: 3.1 mmol/L — AB (ref 3.5–5.1)

## 2016-07-03 LAB — TROPONIN I

## 2016-07-03 MED ORDER — POTASSIUM CHLORIDE 20 MEQ PO PACK
40.0000 meq | PACK | Freq: Four times a day (QID) | ORAL | Status: AC
  Administered 2016-07-03 (×2): 40 meq via ORAL
  Filled 2016-07-03 (×2): qty 2

## 2016-07-03 MED ORDER — IBUPROFEN 400 MG PO TABS: 400.0000 mg | ORAL_TABLET | Freq: Four times a day (QID) | ORAL | Status: DC | PRN

## 2016-07-03 NOTE — Progress Notes (Signed)
[facility] NAME: Alexander [name]    MR#:  [id number]  DATE OF BIRTH:  02/13/1956  SUBJECTIVE:  CHIEF COMPLAINT:   Chief Complaint  Patient presents with  . Shortness of Breath   The patient is 60 year old Caucasian male with past medical history significant for history of former tobacco abuse for 20 years, quit about 20 years ago, COPD, mantle cell lymphoma, metastatic,  gastro-esophageal reflux disease, BPH, who presents to the hospital with complaints of chest pain, left-sided, shortness of breath, cold symptoms, cough, some sputum production, thick, difficult to lift it up. He felt feverish and chilly, weak, in the hospital. He was noted to have left lower lobe pneumonia and admitted. He is off oxygen now, O2 sats are 98% on room air. The patient still feels poorly, complains of chest pains, cardiac enzymes done yesterday were negative, pain is mostly related to cough.   Review of Systems  Constitutional: Negative for chills, fever and weight loss.  HENT: Negative for congestion.   Eyes: Negative for blurred vision and double vision.  Respiratory: Positive for cough and shortness of breath. Negative for sputum production and wheezing.   Cardiovascular: Positive for chest pain. Negative for palpitations, orthopnea, leg swelling and PND.  Gastrointestinal: Negative for abdominal pain, blood in stool, constipation, diarrhea, nausea and vomiting.  Genitourinary: Negative for dysuria, frequency, hematuria and urgency.  Musculoskeletal: Negative for falls.  Neurological: Negative for dizziness, tremors, focal weakness and headaches.  Endo/Heme/Allergies: Does not bruise/bleed easily.  Psychiatric/Behavioral: Negative for depression. The patient does not have insomnia.     VITAL SIGNS: Blood pressure (!) 121/58, pulse 72, temperature 98.2 F (36.8 C), temperature source Oral, resp. rate 16, height 5\' 10"  (1.778 m), weight 83.8 kg  (184 lb 11.2 oz), SpO2 98 %.  PHYSICAL EXAMINATION:   GENERAL:  60 y.o.-year-old patient lying in the bed with no acute distress.  EYES: Pupils equal, round, reactive to light and accommodation. No scleral icterus. Extraocular muscles intact.  HEENT: Head atraumatic, normocephalic. Oropharynx and nasopharynx clear.  NECK:  Supple, no jugular venous distention. No thyroid enlargement, no tenderness. Some chest pain on palpation of the anterior chest LUNGS: Normal breath sounds on  the right posteriorly, better air entrance on the left posteriorly, less rails and at crepitations, no dullness to percussion. No use of accessory muscles of respiration.  CARDIOVASCULAR: S1, S2 normal. No murmurs, rubs, or gallops.  ABDOMEN: Soft, nontender, nondistended. Bowel sounds present. No organomegaly or mass.  EXTREMITIES: No pedal edema, cyanosis, or clubbing.  NEUROLOGIC: Cranial nerves II through XII are intact. Muscle strength 5/5 in all extremities. Sensation intact. Gait not checked.  PSYCHIATRIC: The patient is alert and oriented x 3.  SKIN: No obvious rash, lesion, or ulcer.   ORDERS/RESULTS REVIEWED:   CBC  Recent Labs Lab 07/01/16 1537 07/02/16 0134  WBC 15.3* 11.6*  HGB 13.5 10.5*  HCT 39.3* 31.2*  PLT 236 172  MCV 86.9 87.3  MCH 29.8 29.4  MCHC 34.3 33.7  RDW 13.7 13.5  LYMPHSABS 8.3*  --   MONOABS 0.8  --   EOSABS 0.2  --   BASOSABS 0.2*  --    ------------------------------------------------------------------------------------------------------------------  Chemistries   Recent Labs Lab 07/01/16 1537 07/02/16 0134 07/02/16 0756 07/02/16 1440 07/03/16 0354  NA 140 141  --   --   --   K 2.8* 2.4* 2.8* 3.1* 3.1*  CL 98* 104  --   --   --  CO2 29 31  --   --   --   GLUCOSE 119* 217*  --   --   --   BUN 7 6  --   --   --   CREATININE 0.58* 0.59*  --   --   --   CALCIUM 8.9 7.6*  --   --   --   MG  --   --  1.8  --  1.9  AST 33  --   --   --   --   ALT 23  --   --    --   --   ALKPHOS 101  --   --   --   --   BILITOT 0.8  --   --   --   --    ------------------------------------------------------------------------------------------------------------------ estimated creatinine clearance is 98.9 mL/min (by C-G formula based on SCr of 0.59 mg/dL (L)). ------------------------------------------------------------------------------------------------------------------ No results for input(s): TSH, T4TOTAL, T3FREE, THYROIDAB in the last 72 hours.  Invalid input(s): FREET3  Cardiac Enzymes  Recent Labs Lab 07/02/16 1536 07/02/16 2117 07/03/16 0354  TROPONINI <0.03 <0.03 <0.03   ------------------------------------------------------------------------------------------------------------------ Invalid input(s): POCBNP ---------------------------------------------------------------------------------------------------------------  RADIOLOGY: Dg Chest 2 View  Result Date: 07/01/2016 CLINICAL DATA:  Two weeks of shortness of breath and back soreness. History of COPD and asthma. Lymphoma. Former smoker. EXAM: CHEST  2 VIEW COMPARISON:  PA and lateral chest x-ray of April 10, 2016 FINDINGS: There is patchy infiltrate in the left lower lobe. There is a stable calcified nodule in the right upper lobe. There is no pleural effusion or pneumothorax. The heart and pulmonary vascularity are normal. The mediastinum is normal in width. The bony thorax exhibits no acute abnormality. IMPRESSION: Patchy pneumonia in the left lower lobe. Followup PA and lateral chest X-ray is recommended in 3-4 weeks following trial of antibiotic therapy to ensure resolution and exclude underlying malignancy. No CHF nor other acute cardiopulmonary abnormality. Previous granulomatous infection. Electronically Signed   By: David  Martinique M.D.   On: 07/01/2016 16:04    EKG:  Orders placed or performed during the hospital encounter of 01/26/15  . EKG 12-Lead    ASSESSMENT AND PLAN:  Active  Problems:   Sepsis ([place]) #1. Sepsis due to left lower lobe pneumonia, continue broad-spectrum antibiotic therapy with Rocephin and Zithromax, get sputum cultures if possible, Blood cultures are negative so far.  #2. Left lower lobe pneumonia, clinically improved, continue antibiotics, get sputum cultures, if possible #3. Hypokalemia, supplementing intravenously and orally, magnesium level was checked today again and was found to be normal at 1.9, recheck potassium tomorrow morning #4. Leukocytosis, following with antibiotic therapy, recheck in the morning #5 right lower extremity DVT, continue Lovenox at treatment doses   #6. Metastatic Mantle cell lymphoma, patient receives therapy at the [facility], continue outpatient pain medications, follow-up with primary oncologist according to the schedule #BPH, continue tamsulosin   Management plans discussed with the patient, family and they are in agreement.   DRUG ALLERGIES:  Allergies  Allergen Reactions  . Bee Venom Shortness Of Breath and Swelling  . Ativan [Lorazepam] Other (See Comments)    Reaction:  Dizziness     CODE STATUS:     Code Status Orders        Start     Ordered   07/01/16 2011  Do not attempt resuscitation (DNR)  Continuous    Question Answer Comment  In the event of cardiac or respiratory ARREST Do not call a "  code blue"   In the event of cardiac or respiratory ARREST Do not perform Intubation, CPR, defibrillation or ACLS   In the event of cardiac or respiratory ARREST Use medication by any route, position, wound care, and other measures to relive pain and suffering. May use oxygen, suction and manual treatment of airway obstruction as needed for comfort.      07/01/16 2010    Code Status History    Date Active Date Inactive Code Status Order ID Comments User Context   10/02/2015 11:04 PM 10/04/2015  6:12 PM Full Code [id number]  Lance Coon, MD ED   09/25/2015  1:32 AM 09/26/2015  2:00 PM Full Code [id number]   Saundra Shelling, MD Inpatient   08/17/2015 10:03 PM 08/19/2015  2:23 PM Full Code [id number]  Gladstone Lighter, MD Inpatient   08/10/2015  8:28 AM 08/12/2015  1:40 PM Full Code [id number]  Saundra Shelling, MD Inpatient   07/17/2015  1:08 AM 07/19/2015  2:13 PM Full Code [id number]  Saundra Shelling, MD Inpatient   05/18/2015  1:29 PM 05/19/2015  3:02 PM Full Code [id number]  Dustin Flock, MD Inpatient    Advance Directive Documentation   Flowsheet Row Most Recent Value  Type of Advance Directive  Living will, Healthcare Power of Attorney  Pre-existing out of facility DNR order (yellow form or pink MOST form)  No data  "MOST" Form in Place?  No data      TOTAL TIME TAKING CARE OF THIS PATIENT: . 35 minutes.    Discussed with multiple family members, including wife, 2 sons, all questions were answered, voiced understanding  Ileene Allie M.D on 07/03/2016 at 12:25 PM  Between 7am to 6pm - Pager - [phone]  After 6pm go to [email or web address] - password [email or web address] [facility]  Office  [phone]  CC: Primary care physician; Perrin Maltese, MD

## 2016-07-03 NOTE — Plan of Care (Signed)
Problem: Physical Regulation: Goal: Will remain free from infection Outcome: Progressing No new signs or symptoms of infection noted at this time  Problem: Skin Integrity: Goal: Risk for impaired skin integrity will decrease Outcome: Progressing No new skin breakdown noted  Problem: Activity: Goal: Risk for activity intolerance will decrease Outcome: Progressing Patient able to tolerate some mild acitivity  Problem: Respiratory: Goal: Pain level will decrease Outcome: Progressing Pain meds given as needed and as ordered

## 2016-07-03 NOTE — Evaluation (Signed)
Physical Therapy Evaluation Patient Details Name: Alexander [name] MRN: [id number] DOB: 1956-01-15 Today's Date: 07/03/2016   History of Present Illness  Pt is 60 year old male with past medical history significant for COPD, mantle cell lymphoma, metastatic,  gastro-esophageal reflux disease, BPH, who presents to the hospital with complaints of chest pain, left-sided, shortness of breath, cold symptoms, cough, some sputum production, thick, difficult to lift it up. He felt feverish and chilly, weak, in the hospital. He was noted to have left lower lobe pneumonia and admitted.  Clinical Impression  Pt presents with deficits in strength, transfers, gait, balance, and activity tolerance.  Pt required min A with transfers and was unsteady in standing presenting with mild trembling to BLEs during WB activities.  Pt able to take one small step forward and backward at EOB with RW and min A to prevent LOB before having to sit back down secondary to fatigue.  SpO2 increased from 95% to 97% on 2LO2/min and HR from 77 bpm to 88 bpm after amb.  Pt is a high fall risk with significant deficits in functional strength and activity tolerance and will benefit from short term rehab prior to return home to address above deficits.      Follow Up Recommendations SNF    Equipment Recommendations  None recommended by PT (Pt owns SPC, RW, 4WW, and w/c)    Recommendations for Other Services       Precautions / Restrictions Precautions Precautions: Fall Restrictions Weight Bearing Restrictions: No      Mobility  Bed Mobility Overal bed mobility: Independent                Transfers Overall transfer level: Needs assistance Equipment used: Rolling walker (2 wheeled) Transfers: Sit to/from Stand Sit to Stand: Min assist         General transfer comment: Pt unsteady with sit to/from stand transfers with Min A required to prevent LOB upon initial stand.  Ambulation/Gait Ambulation/Gait assistance:  Min assist   Assistive device: Rolling walker (2 wheeled) Gait Pattern/deviations: Decreased stride length;Trunk flexed;Drifts right/left   Gait velocity interpretation: <1.8 ft/sec, indicative of risk for recurrent falls General Gait Details: Pt unsteady with gait with very poor activity tolerance, high fall risk.  Stairs            Wheelchair Mobility    Modified Rankin (Stroke Patients Only)       Balance Overall balance assessment: Needs assistance Sitting-balance support: Bilateral upper extremity supported Sitting balance-Leahy Scale: Good     Standing balance support: Bilateral upper extremity supported Standing balance-Leahy Scale: Poor                               Pertinent Vitals/Pain Pain Assessment: No/denies pain    Home Living Family/patient expects to be discharged to:: Skilled nursing facility Living Arrangements: Spouse/significant other;Children               Additional Comments: Pt lives in [place] home but stays on the first floor, has ramp access into home.    Prior Function Level of Independence: Independent with assistive device(s)         Comments: Pt reports ind amb at home with a SPC and has been using a w/c in the community; uses 2LO2/min at night at home     Hand Dominance   Dominant Hand: Right    Extremity/Trunk Assessment   Upper Extremity Assessment: Generalized weakness  Lower Extremity Assessment: Generalized weakness         Communication   Communication: No difficulties  Cognition Arousal/Alertness: Awake/alert Behavior During Therapy: WFL for tasks assessed/performed Overall Cognitive Status: Within Functional Limits for tasks assessed                      General Comments      Exercises Total Joint Exercises Ankle Circles/Pumps: AROM;Both;10 reps Quad Sets: AROM;Both;10 reps Gluteal Sets: AROM;Both;10 reps Long Arc Quad: AROM;Both;10 reps Knee Flexion:  AROM;Both;10 reps Marching in Standing: AROM;Both;10 reps Other Exercises Other Exercises: HEP education/reveiw for BLE APs, GS, and QS x 10-15 reps 8-10x/day   Assessment/Plan    PT Assessment Patient needs continued PT services  PT Problem List Decreased strength;Decreased activity tolerance;Decreased balance;Decreased knowledge of use of DME          PT Treatment Interventions DME instruction;Gait training;Functional mobility training;Therapeutic activities;Therapeutic exercise;Balance training;Neuromuscular re-education;Patient/family education    PT Goals (Current goals can be found in the Care Plan section)  Acute Rehab PT Goals Patient Stated Goal: Be able to get out of bed and walk PT Goal Formulation: With patient Time For Goal Achievement: 07/16/16 Potential to Achieve Goals: Good    Frequency Min 2X/week   Barriers to discharge        Co-evaluation               End of Session Equipment Utilized During Treatment: Gait belt;Oxygen Activity Tolerance: Patient limited by fatigue Patient left: in bed;with bed alarm set;with call bell/phone within reach (Pt requested to return to bed at end of session secondary to fatigue)           Time: [id number] PT Time Calculation (min) (ACUTE ONLY): 30 min   Charges:   PT Evaluation $PT Eval Low Complexity: 1 Procedure PT Treatments $Therapeutic Exercise: 8-22 mins   PT G Codes:        DRoyetta Asal PT, DPT 07/03/16, 1:07 PM

## 2016-07-04 ENCOUNTER — Inpatient Hospital Stay: Payer: Medicaid Other

## 2016-07-04 LAB — POTASSIUM: POTASSIUM: 3.5 mmol/L (ref 3.5–5.1)

## 2016-07-04 LAB — CBC
HEMATOCRIT: 30.7 % — AB (ref 40.0–52.0)
HEMOGLOBIN: 10.4 g/dL — AB (ref 13.0–18.0)
MCH: 29.6 pg (ref 26.0–34.0)
MCHC: 33.9 g/dL (ref 32.0–36.0)
MCV: 87.2 fL (ref 80.0–100.0)
Platelets: 193 10*3/uL (ref 150–440)
RBC: 3.52 MIL/uL — ABNORMAL LOW (ref 4.40–5.90)
RDW: 13.6 % (ref 11.5–14.5)
WBC: 14.1 10*3/uL — ABNORMAL HIGH (ref 3.8–10.6)

## 2016-07-04 NOTE — Progress Notes (Signed)
Nutrition Follow-up  DOCUMENTATION CODES:   Severe malnutrition in context of chronic illness  INTERVENTION:  1. Boost Breeze po TID, each supplement provides 250 kcal and 9 grams of protein  NUTRITION DIAGNOSIS:   Malnutrition related to chronic illness as evidenced by moderate depletion of body fat, moderate depletions of muscle mass, percent weight loss, energy intake < or equal to 75% for > or equal to 1 month. -ongoing  GOAL:   Patient will meet greater than or equal to 90% of their needs -meeting  MONITOR:   PO intake, Supplement acceptance, I & O's, Labs, Weight trends  REASON FOR ASSESSMENT:   Malnutrition Screening Tool    ASSESSMENT:   Alexander [name]  is a 60 y.o. male with a known history of COPD not on oxygen, Mantle cell lymphoma metastatic, Candida esophagitis, GERD, BPH presents from home secondary to worsening left-sided chest pain and also shortness of breath for almost 2 weeks now.  Alexander [name] is doing well. PO 100% all day yesterday.  Did not have breakfast this morning, "not a breakfast guy." Continues to consume Boost Breeze No complaints Labs and medications reviewed: K+, Senokot-S, Remeron  Diet Order:  Diet regular Room service appropriate? Yes; Fluid consistency: Thin  Skin:  Reviewed, no issues  Last BM:  06/30/2016  Height:   Ht Readings from Last 1 Encounters:  07/01/16 5\' 10"  (1.778 m)    Weight:   Wt Readings from Last 1 Encounters:  07/01/16 184 lb 11.2 oz (83.8 kg)    Ideal Body Weight:  75.45 kg  BMI:  Body mass index is 26.5 kg/m.  Estimated Nutritional Needs:   Kcal:  2500-2800 calories (30-33 cal/kg)  Protein:  100-125 gm  Fluid:  >/= 2.5L  EDUCATION NEEDS:   No education needs identified at this time  Satira Anis. Shya Kovatch, MS, RD LDN Inpatient Clinical Dietitian Pager [phone]

## 2016-07-04 NOTE — Care Management (Signed)
Patient is agreeable to physical recommendation for skilled nursing placement.  Since he is followed by palliative care at [facility], may benefit from palliative care to follow at the skilled nursing facility.

## 2016-07-04 NOTE — Progress Notes (Signed)
[name] NAME: Alexander [name]    MR#:  [id number]  DATE OF BIRTH:  November 24, 1955  SUBJECTIVE:  CHIEF COMPLAINT:   Chief Complaint  Patient presents with  . Shortness of Breath   The patient is 60 year old Caucasian male with past medical history significant for history of former tobacco abuse for 20 years, quit about 20 years ago, COPD, mantle cell lymphoma, metastatic,  gastro-esophageal reflux disease, BPH, who presents to the hospital with complaints of chest pain, left-sided, shortness of breath, cold symptoms, cough, some sputum production, thick, difficult to lift it up. He felt feverish and chilly, weak, in the hospital. He was noted to have left lower lobe pneumonia and admitted. He is off oxygen now, O2 sats are 98% on room air. The patient feels overall better today. Physical therapist recommended skilled nursing facility placement, likely tomorrow   Review of Systems  Constitutional: Negative for chills, fever and weight loss.  HENT: Negative for congestion.   Eyes: Negative for blurred vision and double vision.  Respiratory: Positive for cough and shortness of breath. Negative for sputum production and wheezing.   Cardiovascular: Positive for chest pain. Negative for palpitations, orthopnea, leg swelling and PND.  Gastrointestinal: Negative for abdominal pain, blood in stool, constipation, diarrhea, nausea and vomiting.  Genitourinary: Negative for dysuria, frequency, hematuria and urgency.  Musculoskeletal: Negative for falls.  Neurological: Negative for dizziness, tremors, focal weakness and headaches.  Endo/Heme/Allergies: Does not bruise/bleed easily.  Psychiatric/Behavioral: Negative for depression. The patient does not have insomnia.     VITAL SIGNS: Blood pressure 117/60, pulse 72, temperature 98 F (36.7 C), temperature source Oral, resp. rate 18, height 5\' 10"  (1.778 m), weight 83.8 kg (184 lb 11.2 oz), SpO2  95 %.  PHYSICAL EXAMINATION:   GENERAL:  60 y.o.-year-old patient lying in the bed with no acute distress.  EYES: Pupils equal, round, reactive to light and accommodation. No scleral icterus. Extraocular muscles intact.  HEENT: Head atraumatic, normocephalic. Oropharynx and nasopharynx clear.  NECK:  Supple, no jugular venous distention. No thyroid enlargement, no tenderness. Some chest pain on palpation of the anterior chest LUNGS: Normal breath sounds on  the right posteriorly, better air entrance on the left posteriorly, less rales and crepitations, no dullness to percussion. No use of accessory muscles of respiration.  CARDIOVASCULAR: S1, S2 normal. No murmurs, rubs, or gallops.  ABDOMEN: Soft, nontender, nondistended. Bowel sounds present. No organomegaly or mass.  EXTREMITIES: No pedal edema, cyanosis, or clubbing.  NEUROLOGIC: Cranial nerves II through XII are intact. Muscle strength 5/5 in all extremities. Sensation intact. Gait not checked.  PSYCHIATRIC: The patient is alert and oriented x 3.  SKIN: No obvious rash, lesion, or ulcer.   ORDERS/RESULTS REVIEWED:   CBC  Recent Labs Lab 07/01/16 1537 07/02/16 0134 07/04/16 0323  WBC 15.3* 11.6* 14.1*  HGB 13.5 10.5* 10.4*  HCT 39.3* 31.2* 30.7*  PLT 236 172 193  MCV 86.9 87.3 87.2  MCH 29.8 29.4 29.6  MCHC 34.3 33.7 33.9  RDW 13.7 13.5 13.6  LYMPHSABS 8.3*  --   --   MONOABS 0.8  --   --   EOSABS 0.2  --   --   BASOSABS 0.2*  --   --    ------------------------------------------------------------------------------------------------------------------  Chemistries   Recent Labs Lab 07/01/16 1537 07/02/16 0134 07/02/16 0756 07/02/16 1440 07/03/16 0354 07/04/16 0323  NA 140 141  --   --   --   --  K 2.8* 2.4* 2.8* 3.1* 3.1* 3.5  CL 98* 104  --   --   --   --   CO2 29 31  --   --   --   --   GLUCOSE 119* 217*  --   --   --   --   BUN 7 6  --   --   --   --   CREATININE 0.58* 0.59*  --   --   --   --   CALCIUM  8.9 7.6*  --   --   --   --   MG  --   --  1.8  --  1.9  --   AST 33  --   --   --   --   --   ALT 23  --   --   --   --   --   ALKPHOS 101  --   --   --   --   --   BILITOT 0.8  --   --   --   --   --    ------------------------------------------------------------------------------------------------------------------ estimated creatinine clearance is 98.9 mL/min (by C-G formula based on SCr of 0.59 mg/dL (L)). ------------------------------------------------------------------------------------------------------------------ No results for input(s): TSH, T4TOTAL, T3FREE, THYROIDAB in the last 72 hours.  Invalid input(s): FREET3  Cardiac Enzymes  Recent Labs Lab 07/02/16 1536 07/02/16 2117 07/03/16 0354  TROPONINI <0.03 <0.03 <0.03   ------------------------------------------------------------------------------------------------------------------ Invalid input(s): POCBNP ---------------------------------------------------------------------------------------------------------------  RADIOLOGY: No results found.  EKG:  Orders placed or performed during the hospital encounter of 01/26/15  . EKG 12-Lead    ASSESSMENT AND PLAN:  Active Problems:   Sepsis ([place]) #1. Sepsis due to left lower lobe pneumonia, continue broad-spectrum antibiotic therapy with Rocephin and Zithromax, Awaiting for sputum cultures ,  Blood cultures are negative so far.  #2. Left lower lobe pneumonia, clinically improved, continue antibiotics, awaiting for sputum culture results #3. Hypokalemia, supplemented intravenously and orally, resolved  #4. Leukocytosis, some worse today, repeat in the morning, repeat chest x-ray #5 right lower extremity DVT, continue Lovenox at treatment doses   #6. Metastatic Mantle cell lymphoma, patient receives therapy at the [facility], continue outpatient pain medications, follow-up with primary oncologist according to the schedule #7 BPH, continue tamsulosin #8 generalized  weakness, physical therapist recommended skilled nursing facility placement, hopefully tomorrow  Management plans discussed with the patient, family and they are in agreement.   DRUG ALLERGIES:  Allergies  Allergen Reactions  . Bee Venom Shortness Of Breath and Swelling  . Ativan [Lorazepam] Other (See Comments)    Reaction:  Dizziness     CODE STATUS:     Code Status Orders        Start     Ordered   07/01/16 2011  Do not attempt resuscitation (DNR)  Continuous    Question Answer Comment  In the event of cardiac or respiratory ARREST Do not call a "code blue"   In the event of cardiac or respiratory ARREST Do not perform Intubation, CPR, defibrillation or ACLS   In the event of cardiac or respiratory ARREST Use medication by any route, position, wound care, and other measures to relive pain and suffering. May use oxygen, suction and manual treatment of airway obstruction as needed for comfort.      07/01/16 2010    Code Status History    Date Active Date Inactive Code Status Order ID Comments User Context   10/02/2015 11:04 PM 10/04/2015  6:12 PM Full Code [id number]  Lance Coon, MD ED   09/25/2015  1:32 AM 09/26/2015  2:00 PM Full Code [id number]  Saundra Shelling, MD Inpatient   08/17/2015 10:03 PM 08/19/2015  2:23 PM Full Code [id number]  Gladstone Lighter, MD Inpatient   08/10/2015  8:28 AM 08/12/2015  1:40 PM Full Code [id number]  Saundra Shelling, MD Inpatient   07/17/2015  1:08 AM 07/19/2015  2:13 PM Full Code [id number]  Saundra Shelling, MD Inpatient   05/18/2015  1:29 PM 05/19/2015  3:02 PM Full Code [id number]  Dustin Flock, MD Inpatient    Advance Directive Documentation   Flowsheet Row Most Recent Value  Type of Advance Directive  Living will, Healthcare Power of Attorney  Pre-existing out of facility DNR order (yellow form or pink MOST form)  No data  "MOST" Form in Place?  No data      TOTAL TIME TAKING CARE OF THIS PATIENT: . 30 minutes.    Discussed with care  management  [place] Delauder M.D on 07/04/2016 at 1:14 PM  Between 7am to 6pm - Pager - [phone]  After 6pm go to [email or web address] - password [email or web address] [place] [facility]  Office  [phone]  CC: Primary care physician; Perrin Maltese, MD

## 2016-07-05 DIAGNOSIS — J189 Pneumonia, unspecified organism: Secondary | ICD-10-CM

## 2016-07-05 DIAGNOSIS — R531 Weakness: Secondary | ICD-10-CM

## 2016-07-05 DIAGNOSIS — C8590 Non-Hodgkin lymphoma, unspecified, unspecified site: Secondary | ICD-10-CM

## 2016-07-05 LAB — LACTATE DEHYDROGENASE: LDH: 147 U/L (ref 98–192)

## 2016-07-05 LAB — CBC
HEMATOCRIT: 32.7 % — AB (ref 40.0–52.0)
HEMOGLOBIN: 11 g/dL — AB (ref 13.0–18.0)
MCH: 29.4 pg (ref 26.0–34.0)
MCHC: 33.7 g/dL (ref 32.0–36.0)
MCV: 87.4 fL (ref 80.0–100.0)
Platelets: 220 10*3/uL (ref 150–440)
RBC: 3.74 MIL/uL — ABNORMAL LOW (ref 4.40–5.90)
RDW: 13.7 % (ref 11.5–14.5)
WBC: 15.1 10*3/uL — ABNORMAL HIGH (ref 3.8–10.6)

## 2016-07-05 LAB — DIFFERENTIAL
BAND NEUTROPHILS: 1 %
BASOS ABS: 0.2 10*3/uL — AB (ref 0–0.1)
BASOS PCT: 1 %
Blasts: 0 %
EOS ABS: 0.5 10*3/uL (ref 0–0.7)
Eosinophils Relative: 3 %
LYMPHS ABS: 10.9 10*3/uL — AB (ref 1.0–3.6)
LYMPHS PCT: 73 %
METAMYELOCYTES PCT: 0 %
MONO ABS: 0.6 10*3/uL (ref 0.2–1.0)
MYELOCYTES: 0 %
Monocytes Relative: 4 %
NEUTROS ABS: 2.9 10*3/uL (ref 1.4–6.5)
NEUTROS PCT: 18 %
OTHER: 0 %
PROMYELOCYTES ABS: 0 %
nRBC: 0 /100 WBC

## 2016-07-05 MED ORDER — GUAIFENESIN ER 600 MG PO TB12
600.0000 mg | ORAL_TABLET | Freq: Two times a day (BID) | ORAL | 0 refills | Status: DC
Start: 2016-07-05 — End: 2016-07-30

## 2016-07-05 MED ORDER — AZITHROMYCIN 250 MG PO TABS
250.0000 mg | ORAL_TABLET | Freq: Every day | ORAL | 0 refills | Status: DC
Start: 2016-07-05 — End: 2016-12-04

## 2016-07-05 MED ORDER — BOOST / RESOURCE BREEZE PO LIQD: 1.0000 | Freq: Three times a day (TID) | ORAL | 6 refills | Status: AC

## 2016-07-05 MED ORDER — IPRATROPIUM-ALBUTEROL 0.5-2.5 (3) MG/3ML IN SOLN
3.0000 mL | Freq: Four times a day (QID) | RESPIRATORY_TRACT | Status: DC
  Administered 2016-07-05: 3 mL via RESPIRATORY_TRACT
  Filled 2016-07-05: qty 3

## 2016-07-05 MED ORDER — SALINE SPRAY 0.65 % NA SOLN: 2.0000 | NASAL | 0 refills | Status: AC | PRN

## 2016-07-05 MED ORDER — IPRATROPIUM-ALBUTEROL 0.5-2.5 (3) MG/3ML IN SOLN: 3.0000 mL | RESPIRATORY_TRACT | Status: DC | PRN

## 2016-07-05 MED ORDER — POLYETHYLENE GLYCOL 3350 17 G PO PACK: 17.0000 g | PACK | Freq: Every day | ORAL | 0 refills | Status: AC | PRN

## 2016-07-05 MED ORDER — IPRATROPIUM-ALBUTEROL 0.5-2.5 (3) MG/3ML IN SOLN: 3.0000 mL | RESPIRATORY_TRACT | 6 refills | Status: AC | PRN

## 2016-07-05 NOTE — Progress Notes (Signed)
Physical Therapy Treatment Patient Details Name: Alexander [name] MRN: [id number] DOB: 1956/05/18 Today's Date: 07/05/2016    History of Present Illness Pt is 60 year old male with past medical history significant for COPD, mantle cell lymphoma, metastatic,  gastro-esophageal reflux disease, BPH, who presents to the hospital with complaints of chest pain, left-sided, shortness of breath, cold symptoms, cough, some sputum production, thick, difficult to lift it up. He felt feverish and chilly, weak, in the hospital. He was noted to have left lower lobe pneumonia and admitted.    PT Comments    Pt with overall improved functional mobility today and no longer requiring supplemental O2.  Pt able to transfer supine<>sit and sit<>stand with Mod I.  Pt amb 100' with RW and CGA.  Pt with LE weakness, especially in quads and would benefit from continued PT services for strengthening.  Updated discharge recs to HHPT due to progress this date.  Updates discussedwith Case Manager, Joni Reining.   Follow Up Recommendations  Home health PT     Equipment Recommendations  None recommended by PT (pt has needed equip)    Recommendations for Other Services       Precautions / Restrictions Precautions Precautions: Fall Restrictions Weight Bearing Restrictions: No    Mobility  Bed Mobility Overal bed mobility: Modified Independent             General bed mobility comments: HOB elevated  Transfers Overall transfer level: Needs assistance Equipment used: Rolling walker (2 wheeled) Transfers: Sit to/from Stand Sit to Stand: Supervision         General transfer comment: Using posterior LE's to brace against bed; able to maintain balance without physical assist  Ambulation/Gait Ambulation/Gait assistance: Min guard Ambulation Distance (Feet): 100 Feet Assistive device: Rolling walker (2 wheeled) Gait Pattern/deviations: Step-through pattern     General Gait Details: Pt amb 80' with  slow gait, but steady, able to maintain balance with RW; noticeable LE weakness with decreased stability especially R knee.   Stairs            Wheelchair Mobility    Modified Rankin (Stroke Patients Only)       Balance Overall balance assessment: Modified Independent                                  Cognition Arousal/Alertness: Awake/alert Behavior During Therapy: WFL for tasks assessed/performed Overall Cognitive Status: Within Functional Limits for tasks assessed                      Exercises Other Exercises Other Exercises: Focus on quad strengthening exercises this session: QS, SLR, and ABB/ADD; noticeable ext lag with SLR; resisted AP and LAQ's in sitting; al lx 10 reps each    General Comments        Pertinent Vitals/Pain Pain Assessment: No/denies pain    Home Living                      Prior Function            PT Goals (current goals can now be found in the care plan section) Acute Rehab PT Goals Patient Stated Goal: Be able to get out of bed and walk PT Goal Formulation: With patient Time For Goal Achievement: 07/16/16 Potential to Achieve Goals: Good    Frequency    Min 2X/week      PT Plan Discharge plan  needs to be updated    Co-evaluation             End of Session Equipment Utilized During Treatment: Gait belt Activity Tolerance: Patient tolerated treatment well Patient left: in bed;with call bell/phone within reach     Time: 1440-1504 PT Time Calculation (min) (ACUTE ONLY): 24 min  Charges:  $Gait Training: 8-22 mins $Therapeutic Exercise: 8-22 mins                    G Codes:      Diago Haik A Marijose Curington, PT 07-10-2016, 3:17 PM

## 2016-07-05 NOTE — Care Management (Signed)
Physical therapy is now recommending home with home health.  Faxed note to patient ' primary care physician Niger Reed at the [facility] 1 [phone] ; office 1 [phone] for the authorization for the home health services: SN PT Aide, social work. Patient will not be able to receive home health physical therapy under his medicaid.  Updated Advanced

## 2016-07-05 NOTE — Care Management (Signed)
Patient says that skilled nursing placement options have not been explained to him.  Discussed the need for 30 day stay and likelihood of out of county placement.  If it is deemed that he wishes to return home, CM made referral to [facility] for [place] and SW.  Have contacted [place] about that agency paying for home physical therapy as medicaid will not cover those visits.  have made many calls to the [facility] only to be transferred to other numbers and people that are incorrect.  Have left message with the care coordinator Norina Buzzard

## 2016-07-05 NOTE — Progress Notes (Signed)
Pharmacy Antibiotic Note  Alexander [name] is a 60 y.o. male admitted on 07/01/2016 with  pneumonia.  Pharmacy has been consulted for ceftriaxone dosing.  This is day #5 of antibiotics - currently on ceftriaxone and azithromycin. WBC remains elevated.  Plan: Continue ceftriaxone 1 g IV daily  Height: 5\' 10"  (177.8 cm) Weight: 184 lb 11.2 oz (83.8 kg) IBW/kg (Calculated) : 73  Temp (24hrs), Avg:98.2 F (36.8 C), Min:98 F (36.7 C), Max:98.4 F (36.9 C)   Recent Labs Lab 07/01/16 1537 07/02/16 0134 07/04/16 0323 07/05/16 0436  WBC 15.3* 11.6* 14.1* 15.1*  CREATININE 0.58* 0.59*  --   --     Estimated Creatinine Clearance: 98.9 mL/min (by C-G formula based on SCr of 0.59 mg/dL (L)).    Allergies  Allergen Reactions  . Bee Venom Shortness Of Breath and Swelling  . Ativan [Lorazepam] Other (See Comments)    Reaction:  Dizziness     Antimicrobials this admission: 11/13 levofloxacin >> 11/13 11/14 ceftriaxone >> 11/14 Azithromycin >>   Microbiology results:  Thank you for allowing pharmacy to be a part of this patient's care.  Lenis Noon, PharmD Clinical Pharmacist  07/05/2016 2:31 PM

## 2016-07-05 NOTE — Progress Notes (Signed)
[facility] [place] NAME: Alexander [name]    MR#:  [id number]  DATE OF BIRTH:  Oct 01, 1955  SUBJECTIVE:  CHIEF COMPLAINT:   Chief Complaint  Patient presents with  . Shortness of Breath   The patient is 61 year old Caucasian male with past medical history significant for history of former tobacco abuse for 20 years, quit about 20 years ago, COPD, mantle cell lymphoma, metastatic,  gastro-esophageal reflux disease, BPH, who presents to the hospital with complaints of chest pain, left-sided, shortness of breath, cold symptoms, cough, some sputum production, thick, difficult to lift it up. He felt feverish and chilly, weak, in the hospital. He was noted to have left lower lobe pneumonia and admitted. The patient is been treated with Rocephin and Zithromax and improved clinically.  He is off oxygen now, O2 sats are 93% on room air. The patient feels overall better today. Physical therapist recommended skilled nursing facility placement. No referral to skilled nursing facility was made so far. The patient is Medicaid and would not receive physical therapy at home with home health services if goes  home, discussed with care management   Review of Systems  Constitutional: Negative for chills, fever and weight loss.  HENT: Negative for congestion.   Eyes: Negative for blurred vision and double vision.  Respiratory: Positive for cough and shortness of breath. Negative for sputum production and wheezing.   Cardiovascular: Positive for chest pain. Negative for palpitations, orthopnea, leg swelling and PND.  Gastrointestinal: Negative for abdominal pain, blood in stool, constipation, diarrhea, nausea and vomiting.  Genitourinary: Negative for dysuria, frequency, hematuria and urgency.  Musculoskeletal: Negative for falls.  Neurological: Negative for dizziness, tremors, focal weakness and headaches.  Endo/Heme/Allergies: Does not bruise/bleed easily.   Psychiatric/Behavioral: Negative for depression. The patient does not have insomnia.     VITAL SIGNS: Blood pressure 122/71, pulse 83, temperature 98.4 F (36.9 C), temperature source Oral, resp. rate 20, height 5\' 10"  (1.778 m), weight 83.8 kg (184 lb 11.2 oz), SpO2 94 %.  PHYSICAL EXAMINATION:   GENERAL:  60 y.o.-year-old patient lying in the bed with no acute distress.  EYES: Pupils equal, round, reactive to light and accommodation. No scleral icterus. Extraocular muscles intact.  HEENT: Head atraumatic, normocephalic. Oropharynx and nasopharynx clear.  NECK:  Supple, no jugular venous distention. No thyroid enlargement, no tenderness. Some chest pain on palpation of the anterior chest LUNGS: Normal breath sounds on  the right posteriorly, better air entrance on the left posteriorly. No use of accessory muscles of respiration.  CARDIOVASCULAR: S1, S2 normal. No murmurs, rubs, or gallops.  ABDOMEN: Soft, nontender, nondistended. Bowel sounds present. No organomegaly or mass.  EXTREMITIES: No pedal edema, cyanosis, or clubbing.  NEUROLOGIC: Cranial nerves II through XII are intact. Muscle strength 5/5 in all extremities. Sensation intact. Gait not checked.  PSYCHIATRIC: The patient is alert and oriented x 3.  SKIN: No obvious rash, lesion, or ulcer.   ORDERS/RESULTS REVIEWED:   CBC  Recent Labs Lab 07/01/16 1537 07/02/16 0134 07/04/16 0323 07/05/16 0436  WBC 15.3* 11.6* 14.1* 15.1*  HGB 13.5 10.5* 10.4* 11.0*  HCT 39.3* 31.2* 30.7* 32.7*  PLT 236 172 193 220  MCV 86.9 87.3 87.2 87.4  MCH 29.8 29.4 29.6 29.4  MCHC 34.3 33.7 33.9 33.7  RDW 13.7 13.5 13.6 13.7  LYMPHSABS 8.3*  --   --   --   MONOABS 0.8  --   --   --  EOSABS 0.2  --   --   --   BASOSABS 0.2*  --   --   --    ------------------------------------------------------------------------------------------------------------------  Chemistries   Recent Labs Lab 07/01/16 1537 07/02/16 0134 07/02/16 0756  07/02/16 1440 07/03/16 0354 07/04/16 0323  NA 140 141  --   --   --   --   K 2.8* 2.4* 2.8* 3.1* 3.1* 3.5  CL 98* 104  --   --   --   --   CO2 29 31  --   --   --   --   GLUCOSE 119* 217*  --   --   --   --   BUN 7 6  --   --   --   --   CREATININE 0.58* 0.59*  --   --   --   --   CALCIUM 8.9 7.6*  --   --   --   --   MG  --   --  1.8  --  1.9  --   AST 33  --   --   --   --   --   ALT 23  --   --   --   --   --   ALKPHOS 101  --   --   --   --   --   BILITOT 0.8  --   --   --   --   --    ------------------------------------------------------------------------------------------------------------------ estimated creatinine clearance is 98.9 mL/min (by C-G formula based on SCr of 0.59 mg/dL (L)). ------------------------------------------------------------------------------------------------------------------ No results for input(s): TSH, T4TOTAL, T3FREE, THYROIDAB in the last 72 hours.  Invalid input(s): FREET3  Cardiac Enzymes  Recent Labs Lab 07/02/16 1536 07/02/16 2117 07/03/16 0354  TROPONINI <0.03 <0.03 <0.03   ------------------------------------------------------------------------------------------------------------------ Invalid input(s): POCBNP ---------------------------------------------------------------------------------------------------------------  RADIOLOGY: Dg Chest 2 View  Result Date: 07/04/2016 CLINICAL DATA:  60 y/o  M; pneumonia with improved symptoms. EXAM: CHEST  2 VIEW COMPARISON:  07/01/2016 chest radiograph FINDINGS: Persistent opacity projecting over the lateral left lung base probably representing pneumonia. Follow-up radiographs 3-4 weeks is recommended to ensure resolution. No new consolidation of the lung. Stable cardiac silhouette within normal limits. No acute osseous abnormality IMPRESSION: Stable opacity projecting over lateral left lung base probably representing pneumonia/sequelae of pneumonia. Follow-up to resolution with radiographs in  3-4 weeks is recommended. No new acute process. Electronically Signed   By: Kristine Garbe M.D.   On: 07/04/2016 15:12    EKG:  Orders placed or performed during the hospital encounter of 01/26/15  . EKG 12-Lead    ASSESSMENT AND PLAN:  Active Problems:   Sepsis ([place]) #1. Sepsis due to left lower lobe pneumonia, continue broad-spectrum antibiotic therapy with Rocephin and Zithromax, sputum culture is not reported, clinically, patient is improving , repeat the chest x-ray as of yesterday revealed stable opacity, recommended repeat chest x-ray in 3-4 weeks. Blood cultures are negative so far.  #2. Left lower lobe pneumonia, clinically improved, continue antibiotics,  sputum culture results not reported #3. Hypokalemia, supplemented intravenously and orally, resolved  #4. Leukocytosis, worse today,  repeated chest x-ray showed no new changes, she is otherwise asymptomatic, elevation of white blood cell count remains unclear, following closely, questionable lymphoma related, get differential, LDH #5  right lower extremity DVT, continue Lovenox at treatment doses   #6. Metastatic Mantle cell lymphoma, patient receives therapy at the [facility], continue outpatient pain medications, follow-up with primary oncologist according  to the schedule #7 BPH, continue tamsulosin #8 generalized weakness, physical therapist recommended skilled nursing facility placement, physical therapist is to reassess patient again today  Management plans discussed with the patient, family and they are in agreement.   DRUG ALLERGIES:  Allergies  Allergen Reactions  . Bee Venom Shortness Of Breath and Swelling  . Ativan [Lorazepam] Other (See Comments)    Reaction:  Dizziness     CODE STATUS:     Code Status Orders        Start     Ordered   07/01/16 2011  Do not attempt resuscitation (DNR)  Continuous    Question Answer Comment  In the event of cardiac or respiratory ARREST Do not call a "code  blue"   In the event of cardiac or respiratory ARREST Do not perform Intubation, CPR, defibrillation or ACLS   In the event of cardiac or respiratory ARREST Use medication by any route, position, wound care, and other measures to relive pain and suffering. May use oxygen, suction and manual treatment of airway obstruction as needed for comfort.      07/01/16 2010    Code Status History    Date Active Date Inactive Code Status Order ID Comments User Context   10/02/2015 11:04 PM 10/04/2015  6:12 PM Full Code [id number]  Lance Coon, MD ED   09/25/2015  1:32 AM 09/26/2015  2:00 PM Full Code [id number]  Saundra Shelling, MD Inpatient   08/17/2015 10:03 PM 08/19/2015  2:23 PM Full Code [id number]  Gladstone Lighter, MD Inpatient   08/10/2015  8:28 AM 08/12/2015  1:40 PM Full Code [id number]  Saundra Shelling, MD Inpatient   07/17/2015  1:08 AM 07/19/2015  2:13 PM Full Code [id number]  Saundra Shelling, MD Inpatient   05/18/2015  1:29 PM 05/19/2015  3:02 PM Full Code [id number]  Dustin Flock, MD Inpatient    Advance Directive Documentation   Flowsheet Row Most Recent Value  Type of Advance Directive  Living will, Healthcare Power of Attorney  Pre-existing out of facility DNR order (yellow form or pink MOST form)  No data  "MOST" Form in Place?  No data      TOTAL TIME TAKING CARE OF THIS PATIENT: . 30 minutes.    Discussed with Patient's wife, granddaughter, care management  Skilar Marcou M.D on 07/05/2016 at 2:24 PM  Between 7am to 6pm - Pager - [phone]  After 6pm go to [email or web address] - password [email or web address] [facility]  Office  [phone]  CC: Primary care physician; Perrin Maltese, MD

## 2016-07-05 NOTE — Clinical Social Work Note (Signed)
MSW received referral for SNF.  Case discussed with case manager and plan is to discharge home with home health.  MSW to sign off please re-consult if social work needs arise.  Alexander [name], MSW Mon-Fri 8a-4:30p [phone]   

## 2016-07-05 NOTE — Progress Notes (Signed)
Discharged to home when his wife arrives.  Prescriptions are at [facility].  IV dc'd.  Instructions about home health given

## 2016-07-05 NOTE — Discharge Summary (Signed)
[facility] [place] NAME: Alexander [name]    MR#:  [id number]  DATE OF BIRTH:  March 17, 1956  DATE OF ADMISSION:  07/01/2016 ADMITTING PHYSICIAN: Gladstone Lighter, MD  DATE OF DISCHARGE: No discharge date for patient encounter.  PRIMARY CARE PHYSICIAN: Perrin Maltese, MD     ADMISSION DIAGNOSIS:  Dyspnea, unspecified type [R06.00] Community acquired pneumonia of left lower lobe of lung ([place]) [J18.1]  DISCHARGE DIAGNOSIS:  Principal Problem:   Sepsis ([place]) Active Problems:   CAP (community acquired pneumonia)   Lymphoma ([place])   Generalized weakness   SECONDARY DIAGNOSIS:   Past Medical History:  Diagnosis Date  . Asthma   . BPH (benign prostatic hyperplasia)   . COPD (chronic obstructive pulmonary disease) ([place])   . Deviated nasal septum   . DVT (deep venous thrombosis) ([place])   . Erosive esophagitis   . GERD (gastroesophageal reflux disease)   . Mantle cell lymphoma ([place])   . Migraine     .pro HOSPITAL COURSE:  The patient is 60 year old Caucasian male with past medical history significant for history of former tobacco abuse for 20 years, quit about 20 years ago, COPD, mantle cell lymphoma, metastatic,  gastro-esophageal reflux disease, BPH, who presents to the hospital with complaints of chest pain, left-sided, shortness of breath, cold symptoms, cough, some sputum production, thick, difficult to lift it up. He felt feverish and chilly, weak, in the hospital. He was noted to have left lower lobe pneumonia and admitted. The patient is been treated with Rocephin and Zithromax and improved clinically.  He is off oxygen now, O2 sats are 93% on room air. The patient feels overall better today. His therapist recommended rehabilitation. Initially, however, upon discussion with family, patient and family agreed to return back home with home health services. Patient was felt to be stable  to be discharged home today Discussion by  problem: #1. Sepsis due to left lower lobe pneumonia, continue  Zithromax, sputum culture is not reported, but clinically patient is improving , repeated chest x-ray  yesterday revealed stable opacity, recommended repeat chest x-ray in 3-4 weeks. Blood cultures were negative.  #2. Left lower lobe pneumonia, clinically improved, continue antibiotics,  sputum culture was not reported , but the patient has clinically improved #3. Hypokalemia, supplemented intravenously and orally, resolved  #4. Leukocytosis, repeated chest x-ray showed no new changes, it is recommended to follow patient's white blood cell count as outpatient #5  right lower extremity DVT, continue Lovenox at treatment doses   #6. Metastatic Mantle cell lymphoma, patient receives therapy at the [facility], continue outpatient pain medications, follow-up with primary oncologist according to the schedule #7 BPH, continue tamsulosin, no complaints #8 generalized weakness, physical therapist initially recommended skilled nursing facility placement, care management. Discussed this patient's family and decided to discharge patient with home health services  DISCHARGE CONDITIONS:   Stable  CONSULTS OBTAINED:    DRUG ALLERGIES:   Allergies  Allergen Reactions  . Bee Venom Shortness Of Breath and Swelling  . Ativan [Lorazepam] Other (See Comments)    Reaction:  Dizziness     DISCHARGE MEDICATIONS:   Current Discharge Medication List    START taking these medications   Details  azithromycin (ZITHROMAX) 250 MG tablet Take 1 tablet (250 mg total) by mouth daily. Qty: 6 each, Refills: 0    feeding supplement (BOOST / RESOURCE BREEZE) LIQD Take 1 Container by mouth 3 (three) times daily between meals. Qty: 54 Container,  Refills: 6    guaiFENesin (MUCINEX) 600 MG 12 hr tablet Take 1 tablet (600 mg total) by mouth 2 (two) times daily. Qty: 20 tablet, Refills: 0    ipratropium-albuterol (DUONEB) 0.5-2.5 (3) MG/3ML SOLN Take 3  mLs by nebulization every 4 (four) hours as needed. Qty: 360 mL, Refills: 6    polyethylene glycol (MIRALAX / GLYCOLAX) packet Take 17 g by mouth daily as needed for mild constipation. Qty: 14 each, Refills: 0    sodium chloride (OCEAN) 0.65 % SOLN nasal spray Place 2 sprays into both nostrils as needed for congestion. Qty: 1 Bottle, Refills: 0      CONTINUE these medications which have NOT CHANGED   Details  albuterol (PROAIR HFA) 108 (90 Base) MCG/ACT inhaler Inhale 1-2 puffs into the lungs every 4 (four) hours as needed for wheezing or shortness of breath.     budesonide-formoterol (SYMBICORT) 80-4.5 MCG/ACT inhaler Inhale 2 puffs into the lungs 2 (two) times daily.    enoxaparin (LOVENOX) 100 MG/ML injection Inject 100 mg into the skin every 12 (twelve) hours.    fluconazole (DIFLUCAN) 100 MG tablet Take 100 mg by mouth daily.    HYDROmorphone (DILAUDID) 4 MG tablet Take by mouth every 4 (four) hours as needed for severe pain.    loperamide (IMODIUM) 2 MG capsule Take 2 mg by mouth as needed for diarrhea or loose stools.    methadone (DOLOPHINE) 5 MG tablet Take 10 mg by mouth 2 (two) times daily as needed.    mirtazapine (REMERON) 30 MG tablet Take 30 mg by mouth at bedtime.    potassium chloride SA (K-DUR,KLOR-CON) 20 MEQ tablet Take 20 mEq by mouth 2 (two) times daily.    promethazine (PHENERGAN) 25 MG tablet Take 1 tablet (25 mg total) by mouth every 6 (six) hours as needed for nausea or vomiting. Qty: 12 tablet, Refills: 1    senna-docusate (SENOKOT-S) 8.6-50 MG tablet Take 1 tablet by mouth 2 (two) times daily.    tamsulosin (FLOMAX) 0.4 MG CAPS capsule Take 0.4 mg by mouth daily.    diazepam (VALIUM) 5 MG tablet Take 1 tablet (5 mg total) by mouth every 8 (eight) hours as needed for anxiety. Qty: 20 tablet, Refills: 0    fluticasone (FLONASE) 50 MCG/ACT nasal spray Place 2 sprays into both nostrils daily. Qty: 16 g, Refills: 0    potassium chloride (K-DUR) 10 MEQ  tablet Take 1 tablet (10 mEq total) by mouth 2 (two) times daily. Qty: 6 tablet, Refills: 0    predniSONE (DELTASONE) 20 MG tablet Take 3 tablets (60 mg total) by mouth daily. Qty: 12 tablet, Refills: 0         DISCHARGE INSTRUCTIONS:    The patient is to follow-up with primary care physician as outpatient, oncologist in [place]  If you experience worsening of your admission symptoms, develop shortness of breath, life threatening emergency, suicidal or homicidal thoughts you must seek medical attention immediately by calling 911 or calling your MD immediately  if symptoms less severe.  You Must read complete instructions/literature along with all the possible adverse reactions/side effects for all the Medicines you take and that have been prescribed to you. Take any new Medicines after you have completely understood and accept all the possible adverse reactions/side effects.   Please note  You were cared for by a hospitalist during your hospital stay. If you have any questions about your discharge medications or the care you received while you were in the  hospital after you are discharged, you can call the unit and asked to speak with the hospitalist on call if the hospitalist that took care of you is not available. Once you are discharged, your primary care physician will handle any further medical issues. Please note that NO REFILLS for any discharge medications will be authorized once you are discharged, as it is imperative that you return to your primary care physician (or establish a relationship with a primary care physician if you do not have one) for your aftercare needs so that they can reassess your need for medications and monitor your lab values.    Today   CHIEF COMPLAINT:   Chief Complaint  Patient presents with  . Shortness of Breath    HISTORY OF PRESENT ILLNESS:  Blanca Caperton  is a 60 y.o. male with a known history of former tobacco abuse for 20 years, quit  about 20 years ago, COPD, mantle cell lymphoma, metastatic,  gastro-esophageal reflux disease, BPH, who presents to the hospital with complaints of chest pain, left-sided, shortness of breath, cold symptoms, cough, some sputum production, thick, difficult to lift it up. He felt feverish and chilly, weak, in the hospital. He was noted to have left lower lobe pneumonia and admitted. The patient is been treated with Rocephin and Zithromax and improved clinically.  He is off oxygen now, O2 sats are 93% on room air. The patient feels overall better today. His therapist recommended rehabilitation. Initially, however, upon discussion with family, patient and family agreed to return back home with home health services. Patient was felt to be stable  to be discharged home today Discussion by problem: #1. Sepsis due to left lower lobe pneumonia, continue  Zithromax, sputum culture is not reported, but clinically patient is improving , repeated chest x-ray  yesterday revealed stable opacity, recommended repeat chest x-ray in 3-4 weeks. Blood cultures were negative.  #2. Left lower lobe pneumonia, clinically improved, continue antibiotics,  sputum culture was not reported , but the patient has clinically improved #3. Hypokalemia, supplemented intravenously and orally, resolved  #4. Leukocytosis, repeated chest x-ray showed no new changes, it is recommended to follow patient's white blood cell count as outpatient #5  right lower extremity DVT, continue Lovenox at treatment doses   #6. Metastatic Mantle cell lymphoma, patient receives therapy at the [facility], continue outpatient pain medications, follow-up with primary oncologist according to the schedule #7 BPH, continue tamsulosin, no complaints #8 generalized weakness, physical therapist initially recommended skilled nursing facility placement, care management. Discussed this patient's family and decided to discharge patient with home health services   VITAL  SIGNS:  Blood pressure 122/71, pulse 83, temperature 98.4 F (36.9 C), temperature source Oral, resp. rate 20, height 5\' 10"  (1.778 m), weight 83.8 kg (184 lb 11.2 oz), SpO2 93 %.  I/O:   Intake/Output Summary (Last 24 hours) at 07/05/16 1552 Last data filed at 07/05/16 1009  Gross per 24 hour  Intake              720 ml  Output             2175 ml  Net            -1455 ml    PHYSICAL EXAMINATION:  GENERAL:  60 y.o.-year-old patient lying in the bed with no acute distress.  EYES: Pupils equal, round, reactive to light and accommodation. No scleral icterus. Extraocular muscles intact.  HEENT: Head atraumatic, normocephalic. Oropharynx and nasopharynx clear.  NECK:  Supple, no jugular venous distention. No thyroid enlargement, no tenderness.  LUNGS: Normal breath sounds bilaterally, no wheezing, rales,rhonchi or crepitation. No use of accessory muscles of respiration.  CARDIOVASCULAR: S1, S2 normal. No murmurs, rubs, or gallops.  ABDOMEN: Soft, non-tender, non-distended. Bowel sounds present. No organomegaly or mass.  EXTREMITIES: No pedal edema, cyanosis, or clubbing.  NEUROLOGIC: Cranial nerves II through XII are intact. Muscle strength 5/5 in all extremities. Sensation intact. Gait not checked.  PSYCHIATRIC: The patient is alert and oriented x 3.  SKIN: No obvious rash, lesion, or ulcer.   DATA REVIEW:   CBC  Recent Labs Lab 07/05/16 0436  WBC 15.1*  HGB 11.0*  HCT 32.7*  PLT 220    Chemistries   Recent Labs Lab 07/01/16 1537 07/02/16 0134  07/03/16 0354 07/04/16 0323  NA 140 141  --   --   --   K 2.8* 2.4*  < > 3.1* 3.5  CL 98* 104  --   --   --   CO2 29 31  --   --   --   GLUCOSE 119* 217*  --   --   --   BUN 7 6  --   --   --   CREATININE 0.58* 0.59*  --   --   --   CALCIUM 8.9 7.6*  --   --   --   MG  --   --   < > 1.9  --   AST 33  --   --   --   --   ALT 23  --   --   --   --   ALKPHOS 101  --   --   --   --   BILITOT 0.8  --   --   --   --   < > =  values in this interval not displayed.  Cardiac Enzymes  Recent Labs Lab 07/03/16 0354  TROPONINI <0.03    Microbiology Results  Results for orders placed or performed during the hospital encounter of 07/01/16  Blood culture (routine x 2)     Status: None (Preliminary result)   Collection Time: 07/01/16 10:24 PM  Result Value Ref Range Status   Specimen Description BLOOD  R AC  Final   Special Requests   Final    BOTTLES DRAWN AEROBIC AND ANAEROBIC  AER 7 ML ANA 6 ML   Culture NO GROWTH 4 DAYS  Final   Report Status PENDING  Incomplete  Blood culture (routine x 2)     Status: None (Preliminary result)   Collection Time: 07/02/16  1:34 AM  Result Value Ref Range Status   Specimen Description BLOOD RIGHT AC  Final   Special Requests   Final    BOTTLES DRAWN AEROBIC AND ANAEROBIC  ANA 2ML AER 9ML   Culture NO GROWTH 3 DAYS  Final   Report Status PENDING  Incomplete    RADIOLOGY:  Dg Chest 2 View  Result Date: 07/04/2016 CLINICAL DATA:  60 y/o  M; pneumonia with improved symptoms. EXAM: CHEST  2 VIEW COMPARISON:  07/01/2016 chest radiograph FINDINGS: Persistent opacity projecting over the lateral left lung base probably representing pneumonia. Follow-up radiographs 3-4 weeks is recommended to ensure resolution. No new consolidation of the lung. Stable cardiac silhouette within normal limits. No acute osseous abnormality IMPRESSION: Stable opacity projecting over lateral left lung base probably representing pneumonia/sequelae of pneumonia. Follow-up to resolution with radiographs in 3-4 weeks is recommended. No  new acute process. Electronically Signed   By: Kristine Garbe M.D.   On: 07/04/2016 15:12    EKG:   Orders placed or performed during the hospital encounter of 01/26/15  . EKG 12-Lead      Management plans discussed with the patient, family and they are in agreement.  CODE STATUS:     Code Status Orders        Start     Ordered   07/01/16 2011  Do  not attempt resuscitation (DNR)  Continuous    Question Answer Comment  In the event of cardiac or respiratory ARREST Do not call a "code blue"   In the event of cardiac or respiratory ARREST Do not perform Intubation, CPR, defibrillation or ACLS   In the event of cardiac or respiratory ARREST Use medication by any route, position, wound care, and other measures to relive pain and suffering. May use oxygen, suction and manual treatment of airway obstruction as needed for comfort.      07/01/16 2010    Code Status History    Date Active Date Inactive Code Status Order ID Comments User Context   10/02/2015 11:04 PM 10/04/2015  6:12 PM Full Code [id number]  Lance Coon, MD ED   09/25/2015  1:32 AM 09/26/2015  2:00 PM Full Code [id number]  Saundra Shelling, MD Inpatient   08/17/2015 10:03 PM 08/19/2015  2:23 PM Full Code [id number]  Gladstone Lighter, MD Inpatient   08/10/2015  8:28 AM 08/12/2015  1:40 PM Full Code [id number]  Saundra Shelling, MD Inpatient   07/17/2015  1:08 AM 07/19/2015  2:13 PM Full Code [id number]  Saundra Shelling, MD Inpatient   05/18/2015  1:29 PM 05/19/2015  3:02 PM Full Code [id number]  Dustin Flock, MD Inpatient    Advance Directive Documentation   Flowsheet Row Most Recent Value  Type of Advance Directive  Living will, Healthcare Power of Attorney  Pre-existing out of facility DNR order (yellow form or pink MOST form)  No data  "MOST" Form in Place?  No data      TOTAL TIME TAKING CARE OF THIS PATIENT: 40 minutes.    Theodoro Grist M.D on 07/05/2016 at 3:52 PM  Between 7am to 6pm - Pager - [phone]  After 6pm go to [email or web address] - password [email or web address] [facility]  Office  [phone]  CC: Primary care physician; Perrin Maltese, MD

## 2016-07-06 LAB — CULTURE, BLOOD (ROUTINE X 2): CULTURE: NO GROWTH

## 2016-07-07 LAB — CULTURE, BLOOD (ROUTINE X 2): CULTURE: NO GROWTH

## 2016-07-30 ENCOUNTER — Emergency Department: Payer: Medicaid Other

## 2016-07-30 ENCOUNTER — Encounter: Payer: Self-pay | Admitting: Emergency Medicine

## 2016-07-30 ENCOUNTER — Emergency Department
Admission: EM | Admit: 2016-07-30 | Discharge: 2016-07-30 | Disposition: A | Discharge status: Home / Self Care | Payer: Medicaid Other | Attending: Emergency Medicine | Admitting: Emergency Medicine

## 2016-07-30 DIAGNOSIS — Z87891 Personal history of nicotine dependence: Secondary | ICD-10-CM | POA: Diagnosis not present

## 2016-07-30 DIAGNOSIS — Z79899 Other long term (current) drug therapy: Secondary | ICD-10-CM | POA: Insufficient documentation

## 2016-07-30 DIAGNOSIS — J449 Chronic obstructive pulmonary disease, unspecified: Secondary | ICD-10-CM | POA: Insufficient documentation

## 2016-07-30 DIAGNOSIS — J45909 Unspecified asthma, uncomplicated: Secondary | ICD-10-CM | POA: Diagnosis not present

## 2016-07-30 DIAGNOSIS — J209 Acute bronchitis, unspecified: Secondary | ICD-10-CM | POA: Diagnosis not present

## 2016-07-30 DIAGNOSIS — R05 Cough: Secondary | ICD-10-CM | POA: Diagnosis present

## 2016-07-30 LAB — TROPONIN I: Troponin I: <0.03 ng/mL (ref <0.03)

## 2016-07-30 LAB — BASIC METABOLIC PANEL
Anion gap: 9 (ref 5–15)
BUN: 10 mg/dL (ref 6–20)
CHLORIDE: 104 mmol/L (ref 101–111)
CO2: 23 mmol/L (ref 22–32)
Calcium: 8.4 mg/dL — ABNORMAL LOW (ref 8.9–10.3)
Creatinine, Ser: 1.03 mg/dL (ref 0.61–1.24)
GFR calc Af Amer: >60 mL/min (ref >60)
GFR calc non Af Amer: >60 mL/min (ref >60)
GLUCOSE: 135 mg/dL — AB (ref 65–99)
Potassium: 3.8 mmol/L (ref 3.5–5.1)
SODIUM: 136 mmol/L (ref 135–145)

## 2016-07-30 LAB — CBC
HEMATOCRIT: 38.9 % — AB (ref 40.0–52.0)
Hemoglobin: 13.1 g/dL (ref 13.0–18.0)
MCH: 29.6 pg (ref 26.0–34.0)
MCHC: 33.6 g/dL (ref 32.0–36.0)
MCV: 87.9 fL (ref 80.0–100.0)
PLATELETS: 116 10*3/uL — AB (ref 150–440)
RBC: 4.43 MIL/uL (ref 4.40–5.90)
RDW: 14.4 % (ref 11.5–14.5)
WBC: 12.3 10*3/uL — AB (ref 3.8–10.6)

## 2016-07-30 MED ORDER — DOXYCYCLINE HYCLATE 100 MG PO CAPS: 100.0000 mg | ORAL_CAPSULE | Freq: Two times a day (BID) | ORAL | 0 refills | Status: DC

## 2016-07-30 MED ORDER — GUAIFENESIN 100 MG/5ML PO SOLN: 5.0000 mL | ORAL | 0 refills | Status: AC | PRN

## 2016-07-30 MED ORDER — ACETAMINOPHEN 325 MG PO TABS
650.0000 mg | ORAL_TABLET | Freq: Once | ORAL | Status: AC
  Administered 2016-07-30: 650 mg via ORAL
  Filled 2016-07-30: qty 2

## 2016-07-30 MED ORDER — ALBUTEROL SULFATE HFA 108 (90 BASE) MCG/ACT IN AERS: 2.0000 | INHALATION_SPRAY | RESPIRATORY_TRACT | 0 refills | Status: AC | PRN

## 2016-07-30 NOTE — ED Triage Notes (Signed)
Pt with shortness of breath and chest pain for two weeks. Pt with recent PNE and states he is not any better.

## 2016-07-30 NOTE — ED Provider Notes (Signed)
[facility] Emergency Department Provider Note  ____________________________________________  Time seen: Approximately 7:14 PM  I have reviewed the triage vital signs and the nursing notes.   HISTORY  Chief Complaint Shortness of Breath and Chest Pain    HPI Alexander [name] is a 60 y.o. male complains of bilateral rib pain and productive cough for the past month. Was recently hospitalized for pneumonia a few weeks ago and then discharged home. He reports that his symptoms had improved, but then today home health nurse visited and found that he had a fever. His doctor is out of town when he tried to call the primary care clinic, so he called the [place] who told him to come to the ER for evaluation. He otherwise would not have come. No exertional symptoms. No vomiting diaphoresis. Nonradiating     Past Medical History:  Diagnosis Date  . Asthma   . BPH (benign prostatic hyperplasia)   . COPD (chronic obstructive pulmonary disease) ([place])   . Deviated nasal septum   . DVT (deep venous thrombosis) ([place])   . Erosive esophagitis   . GERD (gastroesophageal reflux disease)   . Mantle cell lymphoma ([place])   . Migraine      Patient Active Problem List   Diagnosis Date Noted  . CAP (community acquired pneumonia) 07/05/2016  . Lymphoma ([place]) 07/05/2016  . Generalized weakness 07/05/2016  . Sepsis ([place]) 07/01/2016  . Acute encephalopathy 10/02/2015  . COPD (chronic obstructive pulmonary disease) ([place]) 10/02/2015  . GERD (gastroesophageal reflux disease) 10/02/2015  . BPH (benign prostatic hyperplasia) 10/02/2015  . History of DVT (deep vein thrombosis) 10/02/2015  . Orthostatic hypotension 09/28/2015  . Vasovagal syncope 09/28/2015  . Deep vein thrombosis (DVT) of distal vein of lower extremity ([place]) 09/28/2015  . Hypokalemia 09/25/2015  . Gastroenteritis, acute 09/25/2015  . Gastroenteritis 09/25/2015  . Pressure ulcer 08/18/2015  . HCAP  (healthcare-associated pneumonia) 08/17/2015  . Syncope and collapse 08/10/2015  . Syncope 08/10/2015  . Protein-calorie malnutrition, severe 08/10/2015  . Abdominal pain 07/17/2015  . Nausea & vomiting 07/17/2015  . Malnutrition of moderate degree 07/17/2015  . Diarrhea 05/18/2015  . DEVIATED SEPTUM 05/18/2007  . ALLERGIC RHINITIS 05/18/2007  . ASTHMA 05/18/2007     Past Surgical History:  Procedure Laterality Date  . barryx N/A   . ESOPHAGOGASTRODUODENOSCOPY (EGD) WITH PROPOFOL N/A 02/23/2015   Procedure: ESOPHAGOGASTRODUODENOSCOPY (EGD) WITH PROPOFOL;  Surgeon: Hulen Luster, MD;  Location: [facility] ENDOSCOPY;  Service: Gastroenterology;  Laterality: N/A;  . ESOPHAGOGASTRODUODENOSCOPY (EGD) WITH PROPOFOL N/A 05/04/2015   Procedure: ESOPHAGOGASTRODUODENOSCOPY (EGD) WITH PROPOFOL;  Surgeon: Hulen Luster, MD;  Location: [facility] ENDOSCOPY;  Service: Gastroenterology;  Laterality: N/A;  . ESOPHAGOGASTRODUODENOSCOPY (EGD) WITH PROPOFOL N/A 07/27/2015   Procedure: ESOPHAGOGASTRODUODENOSCOPY (EGD) WITH PROPOFOL;  Surgeon: Hulen Luster, MD;  Location: [facility] ENDOSCOPY;  Service: Gastroenterology;  Laterality: N/A;  . ESOPHAGOGASTRODUODENOSCOPY (EGD) WITH PROPOFOL N/A 10/05/2015   Procedure: ESOPHAGOGASTRODUODENOSCOPY (EGD) WITH PROPOFOL;  Surgeon: Hulen Luster, MD;  Location: [facility] ENDOSCOPY;  Service: Gastroenterology;  Laterality: N/A;  . ESOPHAGOGASTRODUODENOSCOPY (EGD) WITH PROPOFOL N/A 01/04/2016   Procedure: ESOPHAGOGASTRODUODENOSCOPY (EGD) WITH PROPOFOL;  Surgeon: Hulen Luster, MD;  Location: [facility] ENDOSCOPY;  Service: Gastroenterology;  Laterality: N/A;  . NASAL SEPTUM SURGERY    . THROAT SURGERY    . TONSILLECTOMY    . UPPER ESOPHAGEAL ENDOSCOPIC ULTRASOUND (EUS) N/A 10/19/2015   Procedure: UPPER ESOPHAGEAL ENDOSCOPIC ULTRASOUND (EUS);  Surgeon: Holly Bodily, MD;  Location: [facility] ENDOSCOPY;  Service: Gastroenterology;  Laterality: N/A;     Prior to Admission medications   Medication Sig Start Date End Date  Taking? Authorizing Provider  albuterol (PROVENTIL HFA) 108 (90 Base) MCG/ACT inhaler Inhale 2 puffs into the lungs every 4 (four) hours as needed for wheezing or shortness of breath. 07/30/16   Carrie Mew, MD  azithromycin (ZITHROMAX) 250 MG tablet Take 1 tablet (250 mg total) by mouth daily. 07/05/16   Theodoro Grist, MD  budesonide-formoterol (SYMBICORT) 80-4.5 MCG/ACT inhaler Inhale 2 puffs into the lungs 2 (two) times daily.    Historical Provider, MD  diazepam (VALIUM) 5 MG tablet Take 1 tablet (5 mg total) by mouth every 8 (eight) hours as needed for anxiety. Patient not taking: Reported on 07/01/2016 12/28/15   Earleen Newport, MD  doxycycline (VIBRAMYCIN) 100 MG capsule Take 1 capsule (100 mg total) by mouth 2 (two) times daily. 07/30/16   Carrie Mew, MD  enoxaparin (LOVENOX) 100 MG/ML injection Inject 100 mg into the skin every 12 (twelve) hours.    Historical Provider, MD  feeding supplement (BOOST / RESOURCE BREEZE) LIQD Take 1 Container by mouth 3 (three) times daily between meals. 07/05/16   Theodoro Grist, MD  fluconazole (DIFLUCAN) 100 MG tablet Take 100 mg by mouth daily.    Historical Provider, MD  fluticasone (FLONASE) 50 MCG/ACT nasal spray Place 2 sprays into both nostrils daily. Patient not taking: Reported on 07/01/2016 10/04/15   Nicholes Mango, MD  guaiFENesin (ROBITUSSIN) 100 MG/5ML SOLN Take 5 mLs (100 mg total) by mouth every 4 (four) hours as needed for cough or to loosen phlegm. 07/30/16   Carrie Mew, MD  HYDROmorphone (DILAUDID) 4 MG tablet Take by mouth every 4 (four) hours as needed for severe pain.    Historical Provider, MD  ipratropium-albuterol (DUONEB) 0.5-2.5 (3) MG/3ML SOLN Take 3 mLs by nebulization every 4 (four) hours as needed. 07/05/16   Theodoro Grist, MD  loperamide (IMODIUM) 2 MG capsule Take 2 mg by mouth as needed for diarrhea or loose stools.    Historical Provider, MD  methadone (DOLOPHINE) 5 MG tablet Take 10 mg by mouth 2 (two) times  daily as needed.    Historical Provider, MD  mirtazapine (REMERON) 30 MG tablet Take 30 mg by mouth at bedtime.    Historical Provider, MD  polyethylene glycol (MIRALAX / GLYCOLAX) packet Take 17 g by mouth daily as needed for mild constipation. 07/05/16   Theodoro Grist, MD  potassium chloride (K-DUR) 10 MEQ tablet Take 1 tablet (10 mEq total) by mouth 2 (two) times daily. Patient not taking: Reported on 07/01/2016 09/30/15   Fredia Sorrow, MD  potassium chloride SA (K-DUR,KLOR-CON) 20 MEQ tablet Take 20 mEq by mouth 2 (two) times daily.    Historical Provider, MD  predniSONE (DELTASONE) 20 MG tablet Take 3 tablets (60 mg total) by mouth daily. Patient not taking: Reported on 07/01/2016 04/02/16   Loney Hering, MD  promethazine (PHENERGAN) 25 MG tablet Take 1 tablet (25 mg total) by mouth every 6 (six) hours as needed for nausea or vomiting. 09/30/15   Fredia Sorrow, MD  senna-docusate (SENOKOT-S) 8.6-50 MG tablet Take 1 tablet by mouth 2 (two) times daily.    Historical Provider, MD  sodium chloride (OCEAN) 0.65 % SOLN nasal spray Place 2 sprays into both nostrils as needed for congestion. 07/05/16   Theodoro Grist, MD  tamsulosin (FLOMAX) 0.4 MG CAPS capsule Take 0.4 mg by mouth daily.    Historical Provider, MD  Allergies Bee venom and Ativan [lorazepam]   Family History  Problem Relation Age of Onset  . Mesothelioma Father   . Breast cancer Mother     Social History Social History  Substance Use Topics  . Smoking status: Former Research scientist (life sciences)  . Smokeless tobacco: Never Used  . Alcohol use No    Review of Systems  Constitutional:   Positive fever and chills.  ENT:   No sore throat. No rhinorrhea. Cardiovascular:   Positive bilateral chest wall pain with coughing  Respiratory:   No dyspnea positive cough. Gastrointestinal:   Negative for abdominal pain, vomiting and diarrhea.  Genitourinary:   Negative for dysuria or difficulty urinating. Musculoskeletal:   Negative for  focal pain or swelling Neurological:   Negative for headaches 10-point ROS otherwise negative.  ____________________________________________   PHYSICAL EXAM:  VITAL SIGNS: ED Triage Vitals  Enc Vitals Group     BP 07/30/16 1507 126/81     Pulse Rate 07/30/16 1507 93     Resp 07/30/16 1507 20     Temp 07/30/16 1507 (!) 101.4 F (38.6 C)     Temp Source 07/30/16 1507 Oral     SpO2 07/30/16 1507 93 %     Weight 07/30/16 1508 180 lb (81.6 kg)     Height --      Head Circumference --      Peak Flow --      Pain Score 07/30/16 1508 8     Pain Loc --      Pain Edu? --      Excl. in [place]? --     Vital signs reviewed, nursing assessments reviewed.   Constitutional:   Alert and oriented. Well appearing and in no distress. Eyes:   No scleral icterus. No conjunctival pallor. PERRL. EOMI.  No nystagmus. ENT   Head:   Normocephalic and atraumatic.   Nose:   No congestion/rhinnorhea. No septal hematoma   Mouth/Throat:   MMM, no pharyngeal erythema. No peritonsillar mass.    Neck:   No stridor. No SubQ emphysema. No meningismus. Hematological/Lymphatic/Immunilogical:   No cervical lymphadenopathy. Cardiovascular:   RRR. Symmetric bilateral radial and DP pulses.  No murmurs.  Respiratory:   Normal respiratory effort without tachypnea nor retractions. Breath sounds are clear and equal bilaterally. There is faint wheezing with coughing and rapid expiration with normal expiratory phase. Gastrointestinal:   Soft and nontender. Non distended. There is no CVA tenderness.  No rebound, rigidity, or guarding. Genitourinary:   deferred Musculoskeletal:   Nontender with normal range of motion in all extremities. No joint effusions.  No lower extremity tenderness.  No edema. Neurologic:   Normal speech and language.  CN 2-10 normal. Motor grossly intact. No gross focal neurologic deficits are appreciated.  Skin:    Skin is warm, dry and intact. No rash noted.  No petechiae, purpura, or  bullae.  ____________________________________________    LABS (pertinent positives/negatives) (all labs ordered are listed, but only abnormal results are displayed) Labs Reviewed  BASIC METABOLIC PANEL - Abnormal; Notable for the following:       Result Value   Glucose, Bld 135 (*)    Calcium 8.4 (*)    All other components within normal limits  CBC - Abnormal; Notable for the following:    WBC 12.3 (*)    HCT 38.9 (*)    Platelets 116 (*)    All other components within normal limits  TROPONIN I   ____________________________________________   EKG  Interpreted  by me Sinus rhythm rate of 97, left axis, normal intervals. Normal QRS ST segments and T waves.  ____________________________________________    RADIOLOGY  Chest x-ray unremarkable, shows resolution of previously identified basilar pneumonia  ____________________________________________   PROCEDURES Procedures  ____________________________________________   INITIAL IMPRESSION / ASSESSMENT AND PLAN / ED COURSE  Pertinent labs & imaging results that were available during my care of the patient were reviewed by me and considered in my medical decision making (see chart for details).  Patient well appearing no acute distress, presents with continued productive cough and chest wall pain, exam consistent with bronchitis, likely residual from his previous illness. Vital signs unremarkable except for a low-grade fever.  Low suspicion for PE carditis ACS dissection or sepsis.  Patient was treated with ceftriaxone and azithromycin in the hospital previously. We'll start him on doxycycline as well as albuterol and guaifenesin or symptom improvement. Follow-up with primary care. Continue Tylenol for fever control. No current evidence of DVT. Patient is taking Lovenox shots.     Clinical Course    ____________________________________________   FINAL CLINICAL IMPRESSION(S) / ED DIAGNOSES  Final diagnoses:   Acute bronchitis, unspecified organism      New Prescriptions   ALBUTEROL (PROVENTIL HFA) 108 (90 BASE) MCG/ACT INHALER    Inhale 2 puffs into the lungs every 4 (four) hours as needed for wheezing or shortness of breath.   DOXYCYCLINE (VIBRAMYCIN) 100 MG CAPSULE    Take 1 capsule (100 mg total) by mouth 2 (two) times daily.   GUAIFENESIN (ROBITUSSIN) 100 MG/5ML SOLN    Take 5 mLs (100 mg total) by mouth every 4 (four) hours as needed for cough or to loosen phlegm.     Portions of this note were generated with dragon dictation software. Dictation errors may occur despite best attempts at proofreading.    Carrie Mew, MD 07/30/16 [id number]

## 2016-07-30 NOTE — ED Notes (Signed)
Pt states he has a cough for 1 month.  Hx pneumonia.  Intermittent chest pain.  No n/v/d.    Pt alert.

## 2016-08-14 ENCOUNTER — Emergency Department
Admission: EM | Admit: 2016-08-14 | Discharge: 2016-08-14 | Disposition: A | Discharge status: Home / Self Care | Payer: Medicaid Other | Attending: Emergency Medicine | Admitting: Emergency Medicine

## 2016-08-14 ENCOUNTER — Encounter: Payer: Self-pay | Admitting: Emergency Medicine

## 2016-08-14 DIAGNOSIS — Z5321 Procedure and treatment not carried out due to patient leaving prior to being seen by health care provider: Secondary | ICD-10-CM | POA: Diagnosis not present

## 2016-08-14 DIAGNOSIS — Z79899 Other long term (current) drug therapy: Secondary | ICD-10-CM | POA: Diagnosis not present

## 2016-08-14 DIAGNOSIS — Z7901 Long term (current) use of anticoagulants: Secondary | ICD-10-CM | POA: Diagnosis not present

## 2016-08-14 DIAGNOSIS — J45909 Unspecified asthma, uncomplicated: Secondary | ICD-10-CM | POA: Insufficient documentation

## 2016-08-14 DIAGNOSIS — J449 Chronic obstructive pulmonary disease, unspecified: Secondary | ICD-10-CM | POA: Diagnosis not present

## 2016-08-14 DIAGNOSIS — Z87891 Personal history of nicotine dependence: Secondary | ICD-10-CM | POA: Diagnosis not present

## 2016-08-14 DIAGNOSIS — T403X1A Poisoning by methadone, accidental (unintentional), initial encounter: Secondary | ICD-10-CM | POA: Insufficient documentation

## 2016-08-14 NOTE — ED Triage Notes (Signed)
Pt brought in by wife.  States the [place] doctor put him back on an anxiety med that makes him confused and then he takes too much methadone.  States he hardly has any methadone left and he just got it filled a few days ago.  Pt drowsy but responsive.

## 2016-08-14 NOTE — ED Notes (Signed)
Pt noted ambulating in lobby without difficulty or distress noted; wife inquiring over wait time and updated on such; wife st that she is taking him home because he can't wait any longer; encouraged to stay and be evaluated but st he cannot sit here any longer

## 2016-08-15 ENCOUNTER — Telehealth: Payer: Self-pay | Admitting: Emergency Medicine

## 2016-08-15 NOTE — Telephone Encounter (Signed)
Called patient due to lwot to inquire about condition and follow up plans. Left message.   

## 2016-11-08 ENCOUNTER — Encounter: Payer: Self-pay | Admitting: *Deleted

## 2016-11-11 ENCOUNTER — Ambulatory Visit: Payer: Medicaid Other | Admitting: Certified Registered Nurse Anesthetist

## 2016-11-11 ENCOUNTER — Ambulatory Visit
Admission: RE | Admit: 2016-11-11 | Discharge: 2016-11-11 | Disposition: A | Discharge status: Home / Self Care | Payer: Medicaid Other | Source: Ambulatory Visit | Attending: Gastroenterology | Admitting: Gastroenterology

## 2016-11-11 ENCOUNTER — Encounter: Payer: Self-pay | Admitting: *Deleted

## 2016-11-11 ENCOUNTER — Encounter
Admission: RE | Disposition: A | Discharge status: Home / Self Care | Payer: Self-pay | Source: Ambulatory Visit | Attending: Gastroenterology

## 2016-11-11 DIAGNOSIS — Z79899 Other long term (current) drug therapy: Secondary | ICD-10-CM | POA: Diagnosis not present

## 2016-11-11 DIAGNOSIS — Z7951 Long term (current) use of inhaled steroids: Secondary | ICD-10-CM | POA: Diagnosis not present

## 2016-11-11 DIAGNOSIS — Z79891 Long term (current) use of opiate analgesic: Secondary | ICD-10-CM | POA: Diagnosis not present

## 2016-11-11 DIAGNOSIS — I739 Peripheral vascular disease, unspecified: Secondary | ICD-10-CM | POA: Insufficient documentation

## 2016-11-11 DIAGNOSIS — N400 Enlarged prostate without lower urinary tract symptoms: Secondary | ICD-10-CM | POA: Diagnosis not present

## 2016-11-11 DIAGNOSIS — Z87891 Personal history of nicotine dependence: Secondary | ICD-10-CM | POA: Insufficient documentation

## 2016-11-11 DIAGNOSIS — K2950 Unspecified chronic gastritis without bleeding: Secondary | ICD-10-CM | POA: Diagnosis not present

## 2016-11-11 DIAGNOSIS — J449 Chronic obstructive pulmonary disease, unspecified: Secondary | ICD-10-CM | POA: Diagnosis not present

## 2016-11-11 DIAGNOSIS — Z86718 Personal history of other venous thrombosis and embolism: Secondary | ICD-10-CM | POA: Insufficient documentation

## 2016-11-11 DIAGNOSIS — K449 Diaphragmatic hernia without obstruction or gangrene: Secondary | ICD-10-CM | POA: Insufficient documentation

## 2016-11-11 DIAGNOSIS — K319 Disease of stomach and duodenum, unspecified: Secondary | ICD-10-CM | POA: Diagnosis not present

## 2016-11-11 DIAGNOSIS — K22711 Barrett's esophagus with high grade dysplasia: Secondary | ICD-10-CM | POA: Diagnosis not present

## 2016-11-11 DIAGNOSIS — K219 Gastro-esophageal reflux disease without esophagitis: Secondary | ICD-10-CM | POA: Insufficient documentation

## 2016-11-11 DIAGNOSIS — Z7902 Long term (current) use of antithrombotics/antiplatelets: Secondary | ICD-10-CM | POA: Insufficient documentation

## 2016-11-11 DIAGNOSIS — K228 Other specified diseases of esophagus: Secondary | ICD-10-CM | POA: Insufficient documentation

## 2016-11-11 DIAGNOSIS — C8310 Mantle cell lymphoma, unspecified site: Secondary | ICD-10-CM | POA: Insufficient documentation

## 2016-11-11 HISTORY — PX: ESOPHAGOGASTRODUODENOSCOPY (EGD) WITH PROPOFOL: SHX5813

## 2016-11-11 HISTORY — DX: Personal history of other diseases of the digestive system: Z87.19

## 2016-11-11 HISTORY — DX: Barrett's esophagus with high grade dysplasia: K22.711

## 2016-11-11 LAB — CBC WITH DIFFERENTIAL/PLATELET
BASOS PCT: 1 %
Basophils Absolute: 0.2 10*3/uL — ABNORMAL HIGH (ref 0–0.1)
EOS ABS: 0.4 10*3/uL (ref 0–0.7)
EOS PCT: 2 %
HEMATOCRIT: 35.8 % — AB (ref 40.0–52.0)
Hemoglobin: 11.9 g/dL — ABNORMAL LOW (ref 13.0–18.0)
LYMPHS ABS: 17.6 10*3/uL — AB (ref 1.0–3.6)
Lymphocytes Relative: 81 %
MCH: 28.2 pg (ref 26.0–34.0)
MCHC: 33.2 g/dL (ref 32.0–36.0)
MCV: 84.8 fL (ref 80.0–100.0)
MONO ABS: 0.9 10*3/uL (ref 0.2–1.0)
Monocytes Relative: 4 %
NEUTROS PCT: 12 %
Neutro Abs: 2.6 10*3/uL (ref 1.4–6.5)
PLATELETS: 172 10*3/uL (ref 150–440)
RBC: 4.22 MIL/uL — ABNORMAL LOW (ref 4.40–5.90)
RDW: 14.2 % (ref 11.5–14.5)
WBC: 21.7 10*3/uL — AB (ref 3.8–10.6)

## 2016-11-11 SURGERY — ESOPHAGOGASTRODUODENOSCOPY (EGD) WITH PROPOFOL
Anesthesia: General

## 2016-11-11 MED ORDER — FENTANYL CITRATE (PF) 100 MCG/2ML IJ SOLN
INTRAMUSCULAR | Status: AC
  Filled 2016-11-11: qty 2

## 2016-11-11 MED ORDER — PROPOFOL 10 MG/ML IV BOLUS
INTRAVENOUS | Status: DC | PRN
  Administered 2016-11-11: 60 mg via INTRAVENOUS

## 2016-11-11 MED ORDER — LIDOCAINE HCL (CARDIAC) 20 MG/ML IV SOLN
INTRAVENOUS | Status: DC | PRN
  Administered 2016-11-11: 80 mg via INTRAVENOUS

## 2016-11-11 MED ORDER — SODIUM CHLORIDE 0.9 % IV SOLN
INTRAVENOUS | Status: DC
  Administered 2016-11-11: 13:00:00 via INTRAVENOUS

## 2016-11-11 MED ORDER — SODIUM CHLORIDE 0.9 % IV SOLN: INTRAVENOUS | Status: DC

## 2016-11-11 MED ORDER — PROPOFOL 500 MG/50ML IV EMUL
INTRAVENOUS | Status: DC | PRN
Start: 2016-11-11 — End: 2016-11-11
  Administered 2016-11-11: 140 ug/kg/min via INTRAVENOUS

## 2016-11-11 MED ORDER — LIDOCAINE HCL (PF) 2 % IJ SOLN
INTRAMUSCULAR | Status: AC
  Filled 2016-11-11: qty 2

## 2016-11-11 MED ORDER — SODIUM CHLORIDE 0.9 % IV SOLN: 2.0000 g | Freq: Once | INTRAVENOUS | Status: DC

## 2016-11-11 MED ORDER — FENTANYL CITRATE (PF) 100 MCG/2ML IJ SOLN
INTRAMUSCULAR | Status: DC | PRN
  Administered 2016-11-11: 50 ug via INTRAVENOUS

## 2016-11-11 MED ORDER — PROPOFOL 500 MG/50ML IV EMUL
INTRAVENOUS | Status: AC
  Filled 2016-11-11: qty 50

## 2016-11-11 NOTE — H&P (Signed)
Outpatient short stay form Pre-procedure 11/11/2016 11:42 AM Alexander Sails MD  Primary Physician: Dr Lamonte Sakai  Reason for visit:  EGD  History of present illness:  Patient is a 61 year old male presenting today as above. He has a history of GERD as well as Barrett's esophagus. He has had at least 4 to 5 treatments with Barryx. His last upper procedure was 01/04/2016. He has been taking omeprazole daily. She also was on some relative however that was discontinued and he is been taking Lovenox 1 dose every morning. He did not take that this morning.  He is also been diagnosed with a recurrence of mantle cell lymphoma and is due to have a port in a be in treatment next week.  He is also had a barium swallow that noted a cervical diverticulum. This is in the region of where he has this complaint of dysphagia. There is also some irregularity to the muscle contractions in the lower esophagus. He has a hiatal hernia. The barium tablet on the procedure passed with no delay.    Current Facility-Administered Medications:  .  0.9 %  sodium chloride infusion, , Intravenous, Continuous, Alexander Sails, MD .  0.9 %  sodium chloride infusion, , Intravenous, Continuous, Alexander Sails, MD  Prescriptions Prior to Admission  Medication Sig Dispense Refill Last Dose  . albuterol (PROVENTIL HFA) 108 (90 Base) MCG/ACT inhaler Inhale 2 puffs into the lungs every 4 (four) hours as needed for wheezing or shortness of breath. 1 Inhaler 0 Past Week at Unknown time  . budesonide-formoterol (SYMBICORT) 80-4.5 MCG/ACT inhaler Inhale 2 puffs into the lungs 2 (two) times daily.   Past Week at Unknown time  . docusate sodium (COLACE) 100 MG capsule Take 100 mg by mouth 2 (two) times daily.   11/10/2016 at Unknown time  . enoxaparin (LOVENOX) 100 MG/ML injection Inject 100 mg into the skin every 12 (twelve) hours.   Past Week at Unknown time  . fluticasone (FLONASE) 50 MCG/ACT nasal spray Place 2 sprays into  both nostrils daily. 16 g 0 11/10/2016 at Unknown time  . guaiFENesin (ROBITUSSIN) 100 MG/5ML SOLN Take 5 mLs (100 mg total) by mouth every 4 (four) hours as needed for cough or to loosen phlegm. 120 mL 0 Past Week at Unknown time  . HYDROmorphone (DILAUDID) 4 MG tablet Take by mouth every 4 (four) hours as needed for severe pain.   11/11/2016 at 0700  . ipratropium-albuterol (DUONEB) 0.5-2.5 (3) MG/3ML SOLN Take 3 mLs by nebulization every 4 (four) hours as needed. 360 mL 6 Past Week at Unknown time  . loperamide (IMODIUM) 2 MG capsule Take 2 mg by mouth as needed for diarrhea or loose stools.   Past Week at Unknown time  . methadone (DOLOPHINE) 5 MG tablet Take 10 mg by mouth 2 (two) times daily as needed.   11/10/2016 at Unknown time  . omeprazole (PRILOSEC) 40 MG capsule Take 40 mg by mouth daily.   11/10/2016 at Unknown time  . polyethylene glycol (MIRALAX / GLYCOLAX) packet Take 17 g by mouth daily as needed for mild constipation. 14 each 0 Past Week at Unknown time  . potassium chloride (K-DUR) 10 MEQ tablet Take 1 tablet (10 mEq total) by mouth 2 (two) times daily. 6 tablet 0 Past Week at Unknown time  . potassium chloride SA (K-DUR,KLOR-CON) 20 MEQ tablet Take 20 mEq by mouth 2 (two) times daily.   Past Week at Unknown time  . promethazine (PHENERGAN) 25  MG tablet Take 1 tablet (25 mg total) by mouth every 6 (six) hours as needed for nausea or vomiting. 12 tablet 1 11/10/2016 at Unknown time  . senna-docusate (SENOKOT-S) 8.6-50 MG tablet Take 1 tablet by mouth 2 (two) times daily.   11/10/2016 at Unknown time  . sodium chloride (OCEAN) 0.65 % SOLN nasal spray Place 2 sprays into both nostrils as needed for congestion. 1 Bottle 0 11/10/2016 at Unknown time  . tamsulosin (FLOMAX) 0.4 MG CAPS capsule Take 0.4 mg by mouth daily.   11/10/2016 at Unknown time  . azithromycin (ZITHROMAX) 250 MG tablet Take 1 tablet (250 mg total) by mouth daily. (Patient not taking: Reported on 11/11/2016) 6 each 0 Not Taking  at Unknown time  . diazepam (VALIUM) 5 MG tablet Take 1 tablet (5 mg total) by mouth every 8 (eight) hours as needed for anxiety. (Patient not taking: Reported on 11/11/2016) 20 tablet 0 Not Taking at Unknown time  . doxycycline (VIBRAMYCIN) 100 MG capsule Take 1 capsule (100 mg total) by mouth 2 (two) times daily. (Patient not taking: Reported on 11/11/2016) 20 capsule 0 Not Taking at Unknown time  . feeding supplement (BOOST / RESOURCE BREEZE) LIQD Take 1 Container by mouth 3 (three) times daily between meals. (Patient not taking: Reported on 11/11/2016) 90 Container 6 Not Taking at Unknown time  . fluconazole (DIFLUCAN) 100 MG tablet Take 100 mg by mouth daily.   Not Taking at Unknown time  . mirtazapine (REMERON) 30 MG tablet Take 30 mg by mouth at bedtime.   Not Taking at Unknown time  . predniSONE (DELTASONE) 20 MG tablet Take 3 tablets (60 mg total) by mouth daily. (Patient not taking: Reported on 07/01/2016) 12 tablet 0 Not Taking at Unknown time     Allergies  Allergen Reactions  . Bee Venom Shortness Of Breath and Swelling  . Ativan [Lorazepam] Other (See Comments)    Reaction:  Dizziness      Past Medical History:  Diagnosis Date  . Asthma   . Barrett's esophagus with high grade dysplasia   . BPH (benign prostatic hyperplasia)   . COPD (chronic obstructive pulmonary disease) ([place])   . Deviated nasal septum   . DVT (deep venous thrombosis) ([place])   . Erosive esophagitis   . GERD (gastroesophageal reflux disease)   . History of ischemic colitis   . Mantle cell lymphoma ([place])   . Migraine     Review of systems:      Physical Exam    Heart and lungs: Regular rate and rhythm without rub or gallop, lungs are bilaterally clear.    HEENT: Normocephalic atraumatic eyes are anicteric    Other:     Pertinant exam for procedure: Soft nontender nondistended bowel sounds positive normoactive    Planned proceedures: EGD and indicated procedures. Currently awaiting CBC with  differential results. I have discussed the risks benefits and complications of procedures to include not limited to bleeding, infection, perforation and the risk of sedation and the patient wishes to proceed.    Alexander Sails, MD Gastroenterology 11/11/2016  11:42 AM

## 2016-11-11 NOTE — Transfer of Care (Signed)
Immediate Anesthesia Transfer of Care Note  Patient: Alexander [name]  Procedure(s) Performed: Procedure(s): ESOPHAGOGASTRODUODENOSCOPY (EGD) WITH PROPOFOL (N/A)  Patient Location: PACU  Anesthesia Type:General  Level of Consciousness: sedated  Airway & Oxygen Therapy: Patient Spontanous Breathing and Patient connected to nasal cannula oxygen  Post-op Assessment: Report given to RN and Post -op Vital signs reviewed and stable  Post vital signs: Reviewed and stable  Last Vitals:  Vitals:   11/11/16 1035  BP: 136/78  Pulse: 70  Resp: 16  Temp: 36.1 C    Last Pain:  Vitals:   11/11/16 1035  TempSrc: Tympanic      Patients Stated Pain Goal: 8 (32/91/91 6606)  Complications: No apparent anesthesia complications

## 2016-11-11 NOTE — Anesthesia Post-op Follow-up Note (Cosign Needed)
Anesthesia QCDR form completed.        

## 2016-11-11 NOTE — Anesthesia Preprocedure Evaluation (Signed)
Anesthesia Evaluation  Patient identified by MRN, date of birth, ID band Patient awake    Reviewed: Allergy & Precautions, H&P , NPO status , Patient's Chart, lab work & pertinent test results  History of Anesthesia Complications Negative for: history of anesthetic complications  Airway Mallampati: III  TM Distance: <3 FB Neck ROM: limited    Dental  (+) Poor Dentition, Edentulous Upper, Edentulous Lower, Missing   Pulmonary shortness of breath and with exertion, asthma , pneumonia, resolved, COPD,  COPD inhaler, former smoker,           Cardiovascular Exercise Tolerance: Good (-) angina+ Peripheral Vascular Disease  (-) Past MI      Neuro/Psych  Headaches, PSYCHIATRIC DISORDERS    GI/Hepatic Neg liver ROS, PUD, GERD  Medicated and Controlled,  Endo/Other  negative endocrine ROS  Renal/GU negative Renal ROS  negative genitourinary   Musculoskeletal   Abdominal   Peds  Hematology negative hematology ROS (+)   Anesthesia Other Findings Past Medical History:   COPD (chronic obstructive pulmonary disease) (*              Migraine                                                     Asthma                                                       BPH (benign prostatic hyperplasia)                           Deviated nasal septum                                        DVT (deep venous thrombosis) (HCC)                           Erosive esophagitis                                          Mantle cell lymphoma (HCC)                                   GERD (gastroesophageal reflux disease)                      Past Surgical History:   THROAT SURGERY                                                barryx  N/A              ESOPHAGOGASTRODUODENOSCOPY (EGD) WITH PROPOFOL  N/A 02/23/2015       Comment:Procedure: ESOPHAGOGASTRODUODENOSCOPY (EGD)               WITH PROPOFOL;  Surgeon: Hulen Luster,  MD;                Location: [facility] ENDOSCOPY;  Service:               Gastroenterology;  Laterality: N/A;   ESOPHAGOGASTRODUODENOSCOPY (EGD) WITH PROPOFOL  N/A 05/04/2015      Comment:Procedure: ESOPHAGOGASTRODUODENOSCOPY (EGD)               WITH PROPOFOL;  Surgeon: Hulen Luster, MD;                Location: [facility] ENDOSCOPY;  Service:               Gastroenterology;  Laterality: N/A;   TONSILLECTOMY                                                 ESOPHAGOGASTRODUODENOSCOPY (EGD) WITH PROPOFOL  N/A 07/27/2015      Comment:Procedure: ESOPHAGOGASTRODUODENOSCOPY (EGD)               WITH PROPOFOL;  Surgeon: Hulen Luster, MD;                Location: [facility] ENDOSCOPY;  Service:               Gastroenterology;  Laterality: N/A;   ESOPHAGOGASTRODUODENOSCOPY (EGD) WITH PROPOFOL  N/A 10/05/2015      Comment:Procedure: ESOPHAGOGASTRODUODENOSCOPY (EGD)               WITH PROPOFOL;  Surgeon: Hulen Luster, MD;                Location: [facility] ENDOSCOPY;  Service:               Gastroenterology;  Laterality: N/A;   NASAL SEPTUM SURGERY                                          UPPER ESOPHAGEAL ENDOSCOPIC ULTRASOUND (EUS)    N/A 10/19/2015       Comment:Procedure: UPPER ESOPHAGEAL ENDOSCOPIC               ULTRASOUND (EUS);  Surgeon: Holly Bodily, MD;  Location: [facility] ENDOSCOPY;                Service: Gastroenterology;  Laterality: N/A;  BMI    Body Mass Index   26.40 kg/m 2      Reproductive/Obstetrics negative OB ROS                             Anesthesia Physical  Anesthesia Plan  ASA: III  Anesthesia Plan: General   Post-op Pain Management:    Induction:   Airway Management Planned:   Additional Equipment:   Intra-op Plan:   Post-operative Plan:   Informed Consent: I have reviewed the patients History and Physical, chart, labs and  discussed the procedure including the risks, benefits and alternatives for the proposed anesthesia with the patient or  authorized representative who has indicated his/her understanding and acceptance.   Dental Advisory Given  Plan Discussed with: Anesthesiologist, CRNA and Surgeon  Anesthesia Plan Comments:         Anesthesia Quick Evaluation

## 2016-11-11 NOTE — Op Note (Signed)
[facility] Gastroenterology Patient Name: Alexander [name] Procedure Date: 11/11/2016 12:40 PM MRN: [id number] Account #: [id number] Date of Birth: [birth year] Admit Type: Outpatient Age: 61 Room: [facility] ENDO ROOM 3 Gender: Male Note Status: Finalized Procedure:            Upper GI endoscopy Indications:          Barrett's low grade dysplasia, Barrett's high grade                        dysplasia, Surveillance for malignancy due to personal                        history of Barrett's esophagus, Follow-up of previous                        ablation treatment of Barrett's esophagus Providers:            Lollie Sails, MD Referring MD:         Perrin Maltese, MD (Referring MD) Medicines:            Monitored Anesthesia Care Complications:        No immediate complications. Procedure:            Pre-Anesthesia Assessment:                       - ASA Grade Assessment: III - A patient with severe                        systemic disease.                       After obtaining informed consent, the endoscope was                        passed under direct vision. Throughout the procedure,                        the patient's blood pressure, pulse, and oxygen                        saturations were monitored continuously. The Endoscope                        was introduced through the mouth, and advanced to the                        third part of duodenum. The upper GI endoscopy was                        accomplished without difficulty. The patient tolerated                        the procedure well. Findings:      The Z-line was variable.      There were esophageal mucosal changes consistent with short-segment       Barrett's esophagus present at the gastroesophageal junction. The       maximum longitudinal extent of these mucosal changes was 1 cm in length.       Mucosa was biopsied with a cold forceps for histology in 4 quadrants at  37and 35 cm from the  incisors.      There were esophageal mucosal changes suggestive of short-segment       Barrett's esophagus present at the gastroesophageal junction, in a short       "tongue". The maximum longitudinal extent of these mucosal changes was 1       cm in length. Mucosa was biopsied with a cold forceps for histology.      A small hiatal hernia was present.      A single 4 mm mucosal nodule with a localized distribution was found in       the mid esophagus, 28 cm from the incisors. Biopsies were taken with a       cold forceps for histology.      The examined duodenum was normal.      Diffuse and patchy mild inflammation characterized by erythema was found       in the gastric body. Biopsies were taken with a cold forceps for       histology.      -there was no evidence of stenosis or stricture throughout. Impression:           - Z-line variable.                       - Esophageal mucosal changes consistent with                        short-segment Barrett's esophagus. Biopsied.                       - Esophageal mucosal changes suggestive of                        short-segment Barrett's esophagus. Biopsied.                       - Small hiatal hernia.                       - Mucosal nodule found in the esophagus. Biopsied.                       - Normal examined duodenum.                       - Bile gastritis. Biopsied. Recommendation:       - Await pathology results.                       - Continue present medications.                       - Return to GI clinic in 4 weeks. Procedure Code(s):    --- Professional ---                       [phone], Esophagogastroduodenoscopy, flexible, transoral;                        with biopsy, single or multiple Diagnosis Code(s):    --- Professional ---                       K22.8, Other specified diseases of esophagus  K44.9, Diaphragmatic hernia without obstruction or                        gangrene                        K29.60, Other gastritis without bleeding                       K22.711, Barrett's esophagus with high grade dysplasia                       Z09, Encounter for follow-up examination after                        completed treatment for conditions other than malignant                        neoplasm CPT copyright 2016 [employer]. All rights reserved. The codes documented in this report are preliminary and upon coder review may  be revised to meet current compliance requirements. Lollie Sails, MD 11/11/2016 1:12:22 PM This report has been signed electronically. Number of Addenda: 0 Note Initiated On: 11/11/2016 12:40 PM      [facility]

## 2016-11-11 NOTE — Anesthesia Postprocedure Evaluation (Signed)
Anesthesia Post Note  Patient: Miquel Dunn  Procedure(s) Performed: Procedure(s) (LRB): ESOPHAGOGASTRODUODENOSCOPY (EGD) WITH PROPOFOL (N/A)  Patient location during evaluation: Endoscopy Anesthesia Type: General Level of consciousness: awake and alert Pain management: pain level controlled Vital Signs Assessment: post-procedure vital signs reviewed and stable Respiratory status: spontaneous breathing, nonlabored ventilation, respiratory function stable and patient connected to nasal cannula oxygen Cardiovascular status: blood pressure returned to baseline and stable Postop Assessment: no signs of nausea or vomiting Anesthetic complications: no     Last Vitals:  Vitals:   11/11/16 1330 11/11/16 1340  BP: 122/71 110/67  Pulse: 62 66  Resp: 16 16  Temp:      Last Pain:  Vitals:   11/11/16 1310  TempSrc: Tympanic                 Martha Clan

## 2016-11-12 ENCOUNTER — Encounter: Payer: Self-pay | Admitting: Gastroenterology

## 2016-11-13 LAB — SURGICAL PATHOLOGY

## 2016-11-30 ENCOUNTER — Inpatient Hospital Stay
Admission: EM | Admit: 2016-11-30 | Discharge: 2016-12-04 | DRG: 872 | Disposition: A | Discharge status: Home / Self Care | Payer: Medicaid Other | Attending: Internal Medicine | Admitting: Internal Medicine

## 2016-11-30 ENCOUNTER — Emergency Department: Payer: Medicaid Other

## 2016-11-30 DIAGNOSIS — J029 Acute pharyngitis, unspecified: Secondary | ICD-10-CM | POA: Diagnosis present

## 2016-11-30 DIAGNOSIS — Z86718 Personal history of other venous thrombosis and embolism: Secondary | ICD-10-CM

## 2016-11-30 DIAGNOSIS — Z888 Allergy status to other drugs, medicaments and biological substances status: Secondary | ICD-10-CM

## 2016-11-30 DIAGNOSIS — C8310 Mantle cell lymphoma, unspecified site: Secondary | ICD-10-CM | POA: Diagnosis present

## 2016-11-30 DIAGNOSIS — Z9221 Personal history of antineoplastic chemotherapy: Secondary | ICD-10-CM

## 2016-11-30 DIAGNOSIS — Z91030 Bee allergy status: Secondary | ICD-10-CM

## 2016-11-30 DIAGNOSIS — R509 Fever, unspecified: Secondary | ICD-10-CM

## 2016-11-30 DIAGNOSIS — A419 Sepsis, unspecified organism: Principal | ICD-10-CM | POA: Diagnosis present

## 2016-11-30 DIAGNOSIS — K219 Gastro-esophageal reflux disease without esophagitis: Secondary | ICD-10-CM | POA: Diagnosis present

## 2016-11-30 DIAGNOSIS — N400 Enlarged prostate without lower urinary tract symptoms: Secondary | ICD-10-CM | POA: Diagnosis present

## 2016-11-30 DIAGNOSIS — G893 Neoplasm related pain (acute) (chronic): Secondary | ICD-10-CM | POA: Diagnosis present

## 2016-11-30 DIAGNOSIS — Z95828 Presence of other vascular implants and grafts: Secondary | ICD-10-CM

## 2016-11-30 DIAGNOSIS — Z8719 Personal history of other diseases of the digestive system: Secondary | ICD-10-CM

## 2016-11-30 DIAGNOSIS — I959 Hypotension, unspecified: Secondary | ICD-10-CM | POA: Diagnosis present

## 2016-11-30 DIAGNOSIS — J449 Chronic obstructive pulmonary disease, unspecified: Secondary | ICD-10-CM | POA: Diagnosis present

## 2016-11-30 DIAGNOSIS — M791 Myalgia, unspecified site: Secondary | ICD-10-CM

## 2016-11-30 DIAGNOSIS — T82538A Leakage of other cardiac and vascular devices and implants, initial encounter: Secondary | ICD-10-CM

## 2016-11-30 DIAGNOSIS — R21 Rash and other nonspecific skin eruption: Secondary | ICD-10-CM

## 2016-11-30 DIAGNOSIS — Z803 Family history of malignant neoplasm of breast: Secondary | ICD-10-CM

## 2016-11-30 DIAGNOSIS — Z66 Do not resuscitate: Secondary | ICD-10-CM | POA: Diagnosis present

## 2016-11-30 DIAGNOSIS — Z7952 Long term (current) use of systemic steroids: Secondary | ICD-10-CM

## 2016-11-30 DIAGNOSIS — Z87891 Personal history of nicotine dependence: Secondary | ICD-10-CM

## 2016-11-30 DIAGNOSIS — Z7951 Long term (current) use of inhaled steroids: Secondary | ICD-10-CM

## 2016-11-30 LAB — COMPREHENSIVE METABOLIC PANEL
ALBUMIN: 4.5 g/dL (ref 3.5–5.0)
ALK PHOS: 87 U/L (ref 38–126)
ALT: 19 U/L (ref 17–63)
ANION GAP: 8 (ref 5–15)
AST: 22 U/L (ref 15–41)
BILIRUBIN TOTAL: 0.7 mg/dL (ref 0.3–1.2)
BUN: 13 mg/dL (ref 6–20)
CALCIUM: 8.8 mg/dL — AB (ref 8.9–10.3)
CO2: 26 mmol/L (ref 22–32)
Chloride: 102 mmol/L (ref 101–111)
Creatinine, Ser: 0.94 mg/dL (ref 0.61–1.24)
GFR calc Af Amer: >60 mL/min (ref >60)
GFR calc non Af Amer: >60 mL/min (ref >60)
GLUCOSE: 201 mg/dL — AB (ref 65–99)
POTASSIUM: 4.5 mmol/L (ref 3.5–5.1)
SODIUM: 136 mmol/L (ref 135–145)
Total Protein: 7.4 g/dL (ref 6.5–8.1)

## 2016-11-30 LAB — CBC WITH DIFFERENTIAL/PLATELET
Basophils Absolute: 0 10*3/uL (ref 0–0.1)
Basophils Relative: 0 %
EOS PCT: 0 %
Eosinophils Absolute: 0 10*3/uL (ref 0–0.7)
HEMATOCRIT: 35.9 % — AB (ref 40.0–52.0)
Hemoglobin: 12 g/dL — ABNORMAL LOW (ref 13.0–18.0)
LYMPHS PCT: 6 %
Lymphs Abs: 0.5 10*3/uL — ABNORMAL LOW (ref 1.0–3.6)
MCH: 28.1 pg (ref 26.0–34.0)
MCHC: 33.5 g/dL (ref 32.0–36.0)
MCV: 83.8 fL (ref 80.0–100.0)
MONO ABS: 0.3 10*3/uL (ref 0.2–1.0)
MONOS PCT: 4 %
NEUTROS ABS: 7.8 10*3/uL — AB (ref 1.4–6.5)
Neutrophils Relative %: 90 %
PLATELETS: 166 10*3/uL (ref 150–440)
RBC: 4.28 MIL/uL — ABNORMAL LOW (ref 4.40–5.90)
RDW: 14.6 % — AB (ref 11.5–14.5)
WBC: 8.7 10*3/uL (ref 3.8–10.6)

## 2016-11-30 LAB — INFLUENZA PANEL BY PCR (TYPE A & B)
Influenza A By PCR: NEGATIVE
Influenza B By PCR: NEGATIVE

## 2016-11-30 LAB — LACTIC ACID, PLASMA: Lactic Acid, Venous: 2.2 mmol/L (ref 0.5–1.9)

## 2016-11-30 MED ORDER — ACETAMINOPHEN 325 MG PO TABS
650.0000 mg | ORAL_TABLET | Freq: Once | ORAL | Status: AC
  Administered 2016-11-30: 650 mg via ORAL

## 2016-11-30 MED ORDER — PIPERACILLIN-TAZOBACTAM 3.375 G IVPB 30 MIN
3.3750 g | Freq: Once | INTRAVENOUS | Status: AC
  Administered 2016-11-30: 3.375 g via INTRAVENOUS
  Filled 2016-11-30: qty 50

## 2016-11-30 MED ORDER — SODIUM CHLORIDE 0.9 % IV BOLUS (SEPSIS)
1000.0000 mL | Freq: Once | INTRAVENOUS | Status: AC
  Administered 2016-12-01: 1000 mL via INTRAVENOUS

## 2016-11-30 MED ORDER — ONDANSETRON HCL 4 MG/2ML IJ SOLN
4.0000 mg | Freq: Once | INTRAMUSCULAR | Status: AC
  Administered 2016-11-30: 4 mg via INTRAVENOUS

## 2016-11-30 MED ORDER — VANCOMYCIN HCL IN DEXTROSE 750-5 MG/150ML-% IV SOLN
750.0000 mg | Freq: Once | INTRAVENOUS | Status: AC
  Administered 2016-12-01: 750 mg via INTRAVENOUS
  Filled 2016-11-30: qty 150

## 2016-11-30 MED ORDER — SODIUM CHLORIDE 0.9 % IV BOLUS (SEPSIS)
1000.0000 mL | Freq: Once | INTRAVENOUS | Status: AC
  Administered 2016-11-30: 1000 mL via INTRAVENOUS
  Filled 2016-11-30: qty 1000

## 2016-11-30 MED ORDER — ACETAMINOPHEN 325 MG PO TABS
ORAL_TABLET | ORAL | Status: AC
  Administered 2016-11-30: 650 mg via ORAL
  Filled 2016-11-30: qty 2

## 2016-11-30 MED ORDER — VANCOMYCIN HCL IN DEXTROSE 1-5 GM/200ML-% IV SOLN
1000.0000 mg | Freq: Once | INTRAVENOUS | Status: AC
  Administered 2016-11-30: 1000 mg via INTRAVENOUS
  Filled 2016-11-30: qty 200

## 2016-11-30 MED ORDER — ONDANSETRON HCL 4 MG/2ML IJ SOLN
INTRAMUSCULAR | Status: AC
  Administered 2016-11-30: 4 mg via INTRAVENOUS
  Filled 2016-11-30: qty 2

## 2016-11-30 MED ORDER — MORPHINE SULFATE (PF) 4 MG/ML IV SOLN
INTRAVENOUS | Status: AC
  Administered 2016-11-30: 4 mg via INTRAVENOUS
  Filled 2016-11-30: qty 1

## 2016-11-30 MED ORDER — VANCOMYCIN HCL IN DEXTROSE 1-5 GM/200ML-% IV SOLN
1000.0000 mg | Freq: Three times a day (TID) | INTRAVENOUS | Status: DC
  Administered 2016-12-01 – 2016-12-02 (×4): 1000 mg via INTRAVENOUS
  Filled 2016-11-30 (×7): qty 200

## 2016-11-30 MED ORDER — MORPHINE SULFATE (PF) 4 MG/ML IV SOLN
4.0000 mg | Freq: Once | INTRAVENOUS | Status: AC
Start: 2016-11-30 — End: 2016-11-30
  Administered 2016-11-30: 4 mg via INTRAVENOUS

## 2016-11-30 NOTE — ED Notes (Signed)
Pt noticed to be unsteady on his feet entering waiting room from parking lot; assisted to wheelchair immediately for stat registration; awake and alert, answering questions with clear speech;

## 2016-11-30 NOTE — ED Notes (Addendum)
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

## 2016-11-30 NOTE — ED Provider Notes (Addendum)
Clinical Course as of Dec 01 2334  Sat Nov 30, 2016  2335 Assuming care from Dr. Diannia Ruder.  In short, Alexander [name] is a 61 y.o. male with a chief complaint of sepsis while undergoing chemotherapy at the [place] for lymphoma.  Refer to the original H&P for additional details.  The current plan of care is to transfer to the [place] if possible vs admission at [facility] if unable to transfer.  Hemodynamically stable.  Sepsis protocol including fluids and broad spectrum antibiotics.  [CF]    Clinical Course User Index [CF] Hinda Kehr, MD     ----------------------------------------- 2:00 AM on 12/01/2016 -----------------------------------------  Sepsis - Repeat Assessment  Performed at:    2:00am  Vitals     Blood pressure 131/63, pulse (!) 127, temperature (!) 102.7 F (39.3 C), temperature source Oral, resp. rate (!) 22, height 5\' 10"  (1.778 m), weight 93.4 kg, SpO2 98 %.   Heart:     Regular rate and rhythm  Lungs:    CTA   Capillary Refill:   <2 sec  Peripheral Pulse:   Radial pulse palpable  Skin:     Pale   We received word back from the [place] that they are on diversion and cannot accept the patient in transfer.  I have paged the hospitalist to discuss admission.  Additionally, as documented in the nursing notes, the patient had an infiltration around his port.  As soon as this was noted the infusion of think my son was stopped and a sandbag and ice pack were placed on the site.  I just assessed the patient and the swelling has essentially disappeared.  There is no desquamation and it is not tender to palpation.  The port will need to be assessed in terms of its patency before being accessed again and I will tell the hospitalist about this.    ----------------------------------------- 2:49 AM on 12/01/2016 -----------------------------------------  ED ECG REPORT I, Elyanah Farino, the attending physician, personally viewed and interpreted this ECG.  Date: 12/01/2016 EKG Time:  2:32 Rate: 98 Rhythm: normal sinus rhythm QRS Axis: LAD Intervals: RSR', otherwise unremarkable ST/T Wave abnormalities: Non-specific ST segment / T-wave changes, but no evidence of acute ischemia. Conduction Disturbances: none Narrative Interpretation: unremarkable    Hinda Kehr, MD 12/01/16 [place], MD 12/01/16 [phone]

## 2016-11-30 NOTE — ED Triage Notes (Signed)
Pt reports fever today, as high as 105 at home; started chemo yesterday for "cancer all over"; reports pain "all over"; shortness of breath with exertion as well as at rest; grunting with respirations in triage; pt awake and alert, restless in wheelchair

## 2016-11-30 NOTE — Progress Notes (Addendum)
Pharmacy Antibiotic Note  Alexander [name] is a 61 y.o. male admitted on 11/30/2016 with sepsis.  Pharmacy has been consulted for vancomcyin/zosyn dosing.  Plan: Gave pt vanc 1g IV x 1 in ED will give an additional vanc 750 mg IV x 1 for a total 1.75g IV load (20 mg/kg load) Will follow up w/ vanc 1g q8h to start 4/15 @ 0800, will draw VT 4/15 @ 2300 prior to 4th dose. Ke 0.082 t1/2 8 hours CrCl 93.6 ml/min  Will initiate zosyn 3.375g IV q8h Will continue to monitor renal function and draw trough sooner if renal function declines.  Height: 5\' 10"  (177.8 cm) Weight: 206 lb (93.4 kg) IBW/kg (Calculated) : 73  Temp (24hrs), Avg:102.7 F (39.3 C), Min:102.7 F (39.3 C), Max:102.7 F (39.3 C)   Recent Labs Lab 11/30/16 2204 11/30/16 2205  WBC 8.7  --   CREATININE 0.94  --   LATICACIDVEN  --  2.2*    Estimated Creatinine Clearance: 93.6 mL/min (by C-G formula based on SCr of 0.94 mg/dL).    Allergies  Allergen Reactions  . Bee Venom Shortness Of Breath and Swelling  . Ativan [Lorazepam] Other (See Comments)    Reaction:  Dizziness     Antimicrobials this admission: 4/14 Vanc >>  4/14 Zosyn >>   Microbiology results: 4/14 BCx: sent -- patient has a port-a-cath placed, CXR NG  LA 2.2  Thank you for allowing pharmacy to be a part of this patient's care.  Tobie Lords, PharmD, BCPS Clinical Pharmacist 11/30/2016

## 2016-11-30 NOTE — ED Notes (Signed)
Report called to Ena Dawley, RN; pt assisted to treatment room by [facility], RN

## 2016-12-01 ENCOUNTER — Encounter: Payer: Self-pay | Admitting: Internal Medicine

## 2016-12-01 DIAGNOSIS — J029 Acute pharyngitis, unspecified: Secondary | ICD-10-CM | POA: Diagnosis present

## 2016-12-01 DIAGNOSIS — Z803 Family history of malignant neoplasm of breast: Secondary | ICD-10-CM | POA: Diagnosis not present

## 2016-12-01 DIAGNOSIS — Z888 Allergy status to other drugs, medicaments and biological substances status: Secondary | ICD-10-CM | POA: Diagnosis not present

## 2016-12-01 DIAGNOSIS — Z86718 Personal history of other venous thrombosis and embolism: Secondary | ICD-10-CM

## 2016-12-01 DIAGNOSIS — Z9221 Personal history of antineoplastic chemotherapy: Secondary | ICD-10-CM

## 2016-12-01 DIAGNOSIS — Z66 Do not resuscitate: Secondary | ICD-10-CM

## 2016-12-01 DIAGNOSIS — C8310 Mantle cell lymphoma, unspecified site: Secondary | ICD-10-CM

## 2016-12-01 DIAGNOSIS — R509 Fever, unspecified: Secondary | ICD-10-CM

## 2016-12-01 DIAGNOSIS — J449 Chronic obstructive pulmonary disease, unspecified: Secondary | ICD-10-CM

## 2016-12-01 DIAGNOSIS — Z87891 Personal history of nicotine dependence: Secondary | ICD-10-CM

## 2016-12-01 DIAGNOSIS — N400 Enlarged prostate without lower urinary tract symptoms: Secondary | ICD-10-CM | POA: Diagnosis present

## 2016-12-01 DIAGNOSIS — Z7901 Long term (current) use of anticoagulants: Secondary | ICD-10-CM | POA: Diagnosis not present

## 2016-12-01 DIAGNOSIS — G893 Neoplasm related pain (acute) (chronic): Secondary | ICD-10-CM | POA: Diagnosis present

## 2016-12-01 DIAGNOSIS — Z8719 Personal history of other diseases of the digestive system: Secondary | ICD-10-CM | POA: Diagnosis not present

## 2016-12-01 DIAGNOSIS — K2270 Barrett's esophagus without dysplasia: Secondary | ICD-10-CM | POA: Diagnosis not present

## 2016-12-01 DIAGNOSIS — Z79899 Other long term (current) drug therapy: Secondary | ICD-10-CM

## 2016-12-01 DIAGNOSIS — Z7952 Long term (current) use of systemic steroids: Secondary | ICD-10-CM | POA: Diagnosis not present

## 2016-12-01 DIAGNOSIS — Z7951 Long term (current) use of inhaled steroids: Secondary | ICD-10-CM | POA: Diagnosis not present

## 2016-12-01 DIAGNOSIS — I959 Hypotension, unspecified: Secondary | ICD-10-CM | POA: Diagnosis present

## 2016-12-01 DIAGNOSIS — R21 Rash and other nonspecific skin eruption: Secondary | ICD-10-CM

## 2016-12-01 DIAGNOSIS — M791 Myalgia: Secondary | ICD-10-CM | POA: Diagnosis present

## 2016-12-01 DIAGNOSIS — Z91030 Bee allergy status: Secondary | ICD-10-CM | POA: Diagnosis not present

## 2016-12-01 DIAGNOSIS — K219 Gastro-esophageal reflux disease without esophagitis: Secondary | ICD-10-CM

## 2016-12-01 DIAGNOSIS — A419 Sepsis, unspecified organism: Secondary | ICD-10-CM | POA: Diagnosis present

## 2016-12-01 LAB — CBC
HEMATOCRIT: 29.8 % — AB (ref 40.0–52.0)
Hemoglobin: 9.9 g/dL — ABNORMAL LOW (ref 13.0–18.0)
MCH: 27.9 pg (ref 26.0–34.0)
MCHC: 33.3 g/dL (ref 32.0–36.0)
MCV: 83.7 fL (ref 80.0–100.0)
PLATELETS: 141 10*3/uL — AB (ref 150–440)
RBC: 3.56 MIL/uL — ABNORMAL LOW (ref 4.40–5.90)
RDW: 14.7 % — AB (ref 11.5–14.5)
WBC: 8.9 10*3/uL (ref 3.8–10.6)

## 2016-12-01 LAB — LACTIC ACID, PLASMA: Lactic Acid, Venous: 1.7 mmol/L (ref 0.5–1.9)

## 2016-12-01 LAB — URINALYSIS, COMPLETE (UACMP) WITH MICROSCOPIC
BILIRUBIN URINE: NEGATIVE
Bacteria, UA: NONE SEEN
Glucose, UA: NEGATIVE mg/dL
HGB URINE DIPSTICK: NEGATIVE
KETONES UR: NEGATIVE mg/dL
Leukocytes, UA: NEGATIVE
NITRITE: NEGATIVE
Protein, ur: NEGATIVE mg/dL
Specific Gravity, Urine: 1.018 (ref 1.005–1.030)
pH: 5 (ref 5.0–8.0)

## 2016-12-01 LAB — BASIC METABOLIC PANEL
Anion gap: 7 (ref 5–15)
BUN: 12 mg/dL (ref 6–20)
CO2: 23 mmol/L (ref 22–32)
CREATININE: 0.97 mg/dL (ref 0.61–1.24)
Calcium: 7.3 mg/dL — ABNORMAL LOW (ref 8.9–10.3)
Chloride: 107 mmol/L (ref 101–111)
GFR calc Af Amer: >60 mL/min (ref >60)
Glucose, Bld: 146 mg/dL — ABNORMAL HIGH (ref 65–99)
POTASSIUM: 3.8 mmol/L (ref 3.5–5.1)
SODIUM: 137 mmol/L (ref 135–145)

## 2016-12-01 LAB — PROCALCITONIN: PROCALCITONIN: 0.4 ng/mL

## 2016-12-01 LAB — VANCOMYCIN, TROUGH: VANCOMYCIN TR: 19 ug/mL (ref 15–20)

## 2016-12-01 MED ORDER — ACETAMINOPHEN 325 MG PO TABS
650.0000 mg | ORAL_TABLET | Freq: Four times a day (QID) | ORAL | Status: DC | PRN
  Filled 2016-12-01: qty 2

## 2016-12-01 MED ORDER — MORPHINE SULFATE (PF) 4 MG/ML IV SOLN
2.0000 mg | INTRAVENOUS | Status: DC | PRN
  Administered 2016-12-01 (×2): 2 mg via INTRAVENOUS
  Filled 2016-12-01 (×2): qty 1

## 2016-12-01 MED ORDER — ONDANSETRON HCL 4 MG/2ML IJ SOLN
4.0000 mg | Freq: Four times a day (QID) | INTRAMUSCULAR | Status: DC | PRN
  Administered 2016-12-01 – 2016-12-03 (×3): 4 mg via INTRAVENOUS
  Filled 2016-12-01 (×3): qty 2

## 2016-12-01 MED ORDER — FLUTICASONE PROPIONATE 50 MCG/ACT NA SUSP
2.0000 | Freq: Every day | NASAL | Status: DC
  Administered 2016-12-04: 2 via NASAL
  Filled 2016-12-01: qty 16

## 2016-12-01 MED ORDER — ACETAMINOPHEN 650 MG RE SUPP
650.0000 mg | Freq: Four times a day (QID) | RECTAL | Status: DC | PRN
  Administered 2016-12-01: 650 mg via RECTAL
  Filled 2016-12-01: qty 1

## 2016-12-01 MED ORDER — BOOST / RESOURCE BREEZE PO LIQD: 1.0000 | Freq: Three times a day (TID) | ORAL | Status: DC

## 2016-12-01 MED ORDER — VANCOMYCIN HCL IN DEXTROSE 1-5 GM/200ML-% IV SOLN: 1000.0000 mg | Freq: Once | INTRAVENOUS | Status: DC

## 2016-12-01 MED ORDER — MORPHINE SULFATE (PF) 4 MG/ML IV SOLN
2.0000 mg | INTRAVENOUS | Status: DC | PRN
  Administered 2016-12-01 (×2): 2 mg via INTRAVENOUS
  Filled 2016-12-01: qty 1

## 2016-12-01 MED ORDER — IPRATROPIUM-ALBUTEROL 0.5-2.5 (3) MG/3ML IN SOLN: 3.0000 mL | RESPIRATORY_TRACT | Status: DC | PRN

## 2016-12-01 MED ORDER — KETOROLAC TROMETHAMINE 30 MG/ML IJ SOLN
30.0000 mg | Freq: Once | INTRAMUSCULAR | Status: AC
  Administered 2016-12-01: 30 mg via INTRAVENOUS
  Filled 2016-12-01: qty 1

## 2016-12-01 MED ORDER — PIPERACILLIN-TAZOBACTAM 3.375 G IVPB
3.3750 g | Freq: Three times a day (TID) | INTRAVENOUS | Status: DC
  Administered 2016-12-01 – 2016-12-02 (×5): 3.375 g via INTRAVENOUS
  Filled 2016-12-01 (×8): qty 50

## 2016-12-01 MED ORDER — POLYETHYLENE GLYCOL 3350 17 G PO PACK: 17.0000 g | PACK | Freq: Every day | ORAL | Status: DC | PRN

## 2016-12-01 MED ORDER — MORPHINE SULFATE (PF) 2 MG/ML IV SOLN
INTRAVENOUS | Status: AC
  Administered 2016-12-01: 4 mg via INTRAVENOUS
  Filled 2016-12-01: qty 2

## 2016-12-01 MED ORDER — SODIUM CHLORIDE 0.9 % IV SOLN
INTRAVENOUS | Status: DC
  Administered 2016-12-01 – 2016-12-02 (×4): via INTRAVENOUS

## 2016-12-01 MED ORDER — ONDANSETRON HCL 4 MG PO TABS: 4.0000 mg | ORAL_TABLET | Freq: Four times a day (QID) | ORAL | Status: DC | PRN

## 2016-12-01 MED ORDER — PIPERACILLIN-TAZOBACTAM 3.375 G IVPB 30 MIN: 3.3750 g | Freq: Once | INTRAVENOUS | Status: DC

## 2016-12-01 MED ORDER — GUAIFENESIN 100 MG/5ML PO SOLN
5.0000 mL | ORAL | Status: DC | PRN
  Filled 2016-12-01: qty 5

## 2016-12-01 MED ORDER — HYDROMORPHONE HCL 1 MG/ML IJ SOLN
1.0000 mg | INTRAMUSCULAR | Status: DC | PRN
  Administered 2016-12-01 – 2016-12-04 (×21): 1 mg via INTRAVENOUS
  Filled 2016-12-01 (×21): qty 1

## 2016-12-01 MED ORDER — SODIUM CHLORIDE 0.9% FLUSH
3.0000 mL | Freq: Two times a day (BID) | INTRAVENOUS | Status: DC
  Administered 2016-12-01 – 2016-12-04 (×7): 3 mL via INTRAVENOUS

## 2016-12-01 MED ORDER — MORPHINE SULFATE (PF) 4 MG/ML IV SOLN
4.0000 mg | Freq: Once | INTRAVENOUS | Status: AC
  Administered 2016-12-01: 4 mg via INTRAVENOUS
  Filled 2016-12-01: qty 1

## 2016-12-01 MED ORDER — TAMSULOSIN HCL 0.4 MG PO CAPS
0.4000 mg | ORAL_CAPSULE | Freq: Every day | ORAL | Status: DC
  Administered 2016-12-02 – 2016-12-04 (×3): 0.4 mg via ORAL
  Filled 2016-12-01 (×3): qty 1

## 2016-12-01 MED ORDER — DOCUSATE SODIUM 100 MG PO CAPS
100.0000 mg | ORAL_CAPSULE | Freq: Two times a day (BID) | ORAL | Status: DC
Start: 2016-12-01 — End: 2016-12-04
  Administered 2016-12-02 – 2016-12-04 (×5): 100 mg via ORAL
  Filled 2016-12-01 (×5): qty 1

## 2016-12-01 MED ORDER — MOMETASONE FURO-FORMOTEROL FUM 100-5 MCG/ACT IN AERO
2.0000 | INHALATION_SPRAY | Freq: Two times a day (BID) | RESPIRATORY_TRACT | Status: DC
  Administered 2016-12-01 – 2016-12-04 (×5): 2 via RESPIRATORY_TRACT
  Filled 2016-12-01: qty 8.8

## 2016-12-01 MED ORDER — METHADONE HCL 10 MG PO TABS
10.0000 mg | ORAL_TABLET | Freq: Every day | ORAL | Status: DC
  Administered 2016-12-02 – 2016-12-04 (×3): 10 mg via ORAL
  Filled 2016-12-01 (×3): qty 1

## 2016-12-01 MED ORDER — MORPHINE SULFATE (PF) 4 MG/ML IV SOLN
4.0000 mg | Freq: Once | INTRAVENOUS | Status: DC
  Filled 2016-12-01: qty 1

## 2016-12-01 MED ORDER — PANTOPRAZOLE SODIUM 40 MG PO TBEC
40.0000 mg | DELAYED_RELEASE_TABLET | Freq: Every day | ORAL | Status: DC
  Administered 2016-12-02 – 2016-12-04 (×3): 40 mg via ORAL
  Filled 2016-12-01 (×3): qty 1

## 2016-12-01 MED ORDER — MIRTAZAPINE 15 MG PO TABS
30.0000 mg | ORAL_TABLET | Freq: Every day | ORAL | Status: DC
  Administered 2016-12-02 – 2016-12-03 (×2): 30 mg via ORAL
  Filled 2016-12-01 (×2): qty 2

## 2016-12-01 MED ORDER — ALBUTEROL SULFATE (2.5 MG/3ML) 0.083% IN NEBU: 2.5000 mg | INHALATION_SOLUTION | RESPIRATORY_TRACT | Status: DC | PRN

## 2016-12-01 MED ORDER — ENOXAPARIN SODIUM 100 MG/ML ~~LOC~~ SOLN
100.0000 mg | Freq: Two times a day (BID) | SUBCUTANEOUS | Status: DC
  Administered 2016-12-01 – 2016-12-02 (×3): 100 mg via SUBCUTANEOUS
  Filled 2016-12-01 (×3): qty 1

## 2016-12-01 MED ORDER — FLUCONAZOLE 100 MG PO TABS
100.0000 mg | ORAL_TABLET | Freq: Every day | ORAL | Status: DC
  Administered 2016-12-02 – 2016-12-04 (×2): 100 mg via ORAL
  Filled 2016-12-01 (×2): qty 1

## 2016-12-01 NOTE — Progress Notes (Signed)
Initial Nutrition Assessment  DOCUMENTATION CODES:   Not applicable  INTERVENTION:  1. Boost Breeze po TID, each supplement provides 250 kcal and 9 grams of protein  NUTRITION DIAGNOSIS:   Inadequate oral intake related to poor appetite as evidenced by per patient/family report.  GOAL:   Patient will meet greater than or equal to 90% of their needs  MONITOR:   PO intake, I & O's, Labs, Weight trends, Supplement acceptance  REASON FOR ASSESSMENT:   Malnutrition Screening Tool, Consult Poor PO  ASSESSMENT:   Alexander [name]  is a 61 y.o. male with a known history of Mantle cell lymphoma, GERD, erosive esophagitis, DVT, COPD, prostate hyperplasia, Barrett's esophagus, bronchial asthma presented to the emergency room with generalized body aches, fever for the last 3 days  Spoke with Alexander [name] at bedside. He was eating normally up until Friday - and ended up sleeping all day that day. Was eating 2-3 meals daily. His weight is up from previous admission - stated he is normally around 200# PO intake today was a little bit of oatmeal, nothing more. Did not consume boost. Does not feel good, just wants to sleep. Currently complains of pain all over - but most significant in his joints and jaw.  Also complains of trouble swallowing pills - states this is new.  Nutrition-Focused physical exam completed. Findings are no fat depletion, no muscle depletion, and no edema.   Labs and medications reviewed: Colace, Remeron NS @ 191mL/hr  Diet Order:  Diet Heart Room service appropriate? Yes; Fluid consistency: Thin  Skin:  Reviewed, no issues  Last BM:  11/30/2016  Height:   Ht Readings from Last 1 Encounters:  11/30/16 5\' 10"  (1.778 m)    Weight:   Wt Readings from Last 1 Encounters:  12/01/16 209 lb 12.8 oz (95.2 kg)    Ideal Body Weight:  75.45 kg  BMI:  Body mass index is 30.1 kg/m.  Estimated Nutritional Needs:   Kcal:  2400-2850 calories  Protein:  115-143  gm  Fluid:  >/= 2.4L  EDUCATION NEEDS:   No education needs identified at this time  Satira Anis. Shaelynn Dragos, MS, RD LDN Inpatient Clinical Dietitian Pager [phone]

## 2016-12-01 NOTE — ED Provider Notes (Signed)
[facility] Emergency Department Provider Note  ____________________________________________   First MD Initiated Contact with Patient 11/30/16 2132     (approximate)  I have reviewed the triage vital signs and the nursing notes.   HISTORY  Chief Complaint Generalized Body Aches and Fever   HPI Alexander [name] is a 61 y.o. 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 He says that the body aches are diffuse and not focal in one area. He also reports of rash that has erupted as well over the past 3-4 days. He denies any cough, nausea vomiting or diarrhea. No urinary frequency or burning. Says that his last chemotherapy was about one half weeks ago.   Past Medical History:  Diagnosis Date  . Asthma   . Barrett's esophagus with high grade dysplasia   . BPH (benign prostatic hyperplasia)   . COPD (chronic obstructive pulmonary disease) ([place])   . Deviated nasal septum   . DVT (deep venous thrombosis) ([place])   . Erosive esophagitis   . GERD (gastroesophageal reflux disease)   . History of ischemic colitis   . Mantle cell lymphoma ([place])   . Migraine     Patient Active Problem List   Diagnosis Date Noted  . CAP (community acquired pneumonia) 07/05/2016  . Lymphoma ([place]) 07/05/2016  . Generalized weakness 07/05/2016  . Sepsis ([place]) 07/01/2016  . Acute encephalopathy 10/02/2015  . COPD (chronic obstructive pulmonary disease) ([place]) 10/02/2015  . GERD (gastroesophageal reflux disease) 10/02/2015  . BPH (benign prostatic hyperplasia) 10/02/2015  . History of DVT (deep vein thrombosis) 10/02/2015  . Orthostatic hypotension 09/28/2015  . Vasovagal syncope 09/28/2015  . Deep vein thrombosis (DVT) of distal vein of lower extremity ([place]) 09/28/2015  . Hypokalemia 09/25/2015  . Gastroenteritis, acute 09/25/2015  . Gastroenteritis  09/25/2015  . Pressure ulcer 08/18/2015  . HCAP (healthcare-associated pneumonia) 08/17/2015  . Syncope and collapse 08/10/2015  . Syncope 08/10/2015  . Protein-calorie malnutrition, severe 08/10/2015  . Abdominal pain 07/17/2015  . Nausea & vomiting 07/17/2015  . Malnutrition of moderate degree 07/17/2015  . Diarrhea 05/18/2015  . DEVIATED SEPTUM 05/18/2007  . ALLERGIC RHINITIS 05/18/2007  . ASTHMA 05/18/2007    Past Surgical History:  Procedure Laterality Date  . barryx N/A   . ESOPHAGOGASTRODUODENOSCOPY (EGD) WITH PROPOFOL N/A 02/23/2015   Procedure: ESOPHAGOGASTRODUODENOSCOPY (EGD) WITH PROPOFOL;  Surgeon: Hulen Luster, MD;  Location: [facility] ENDOSCOPY;  Service: Gastroenterology;  Laterality: N/A;  . ESOPHAGOGASTRODUODENOSCOPY (EGD) WITH PROPOFOL N/A 05/04/2015   Procedure: ESOPHAGOGASTRODUODENOSCOPY (EGD) WITH PROPOFOL;  Surgeon: Hulen Luster, MD;  Location: [facility] ENDOSCOPY;  Service: Gastroenterology;  Laterality: N/A;  . ESOPHAGOGASTRODUODENOSCOPY (EGD) WITH PROPOFOL N/A 07/27/2015   Procedure: ESOPHAGOGASTRODUODENOSCOPY (EGD) WITH PROPOFOL;  Surgeon: Hulen Luster, MD;  Location: [facility] ENDOSCOPY;  Service: Gastroenterology;  Laterality: N/A;  . ESOPHAGOGASTRODUODENOSCOPY (EGD) WITH PROPOFOL N/A 10/05/2015   Procedure: ESOPHAGOGASTRODUODENOSCOPY (EGD) WITH PROPOFOL;  Surgeon: Hulen Luster, MD;  Location: [facility] ENDOSCOPY;  Service: Gastroenterology;  Laterality: N/A;  . ESOPHAGOGASTRODUODENOSCOPY (EGD) WITH PROPOFOL N/A 01/04/2016   Procedure: ESOPHAGOGASTRODUODENOSCOPY (EGD) WITH PROPOFOL;  Surgeon: Hulen Luster, MD;  Location: [facility] ENDOSCOPY;  Service: Gastroenterology;  Laterality: N/A;  . ESOPHAGOGASTRODUODENOSCOPY (EGD) WITH PROPOFOL N/A 11/11/2016   Procedure: ESOPHAGOGASTRODUODENOSCOPY (EGD) WITH PROPOFOL;  Surgeon: Lollie Sails, MD;  Location: [facility] ENDOSCOPY;  Service: Endoscopy;  Laterality: N/A;  . NASAL SEPTUM  SURGERY    . THROAT SURGERY    . TONSILLECTOMY    . UPPER ESOPHAGEAL ENDOSCOPIC  ULTRASOUND (EUS) N/A 10/19/2015   Procedure: UPPER ESOPHAGEAL ENDOSCOPIC ULTRASOUND (EUS);  Surgeon: Holly Bodily, MD;  Location: [facility] ENDOSCOPY;  Service: Gastroenterology;  Laterality: N/A;    Prior to Admission medications   Medication Sig Start Date End Date Taking? Authorizing Provider  albuterol (PROVENTIL HFA) 108 (90 Base) MCG/ACT inhaler Inhale 2 puffs into the lungs every 4 (four) hours as needed for wheezing or shortness of breath. 07/30/16   Carrie Mew, MD  azithromycin (ZITHROMAX) 250 MG tablet Take 1 tablet (250 mg total) by mouth daily. Patient not taking: Reported on 11/11/2016 07/05/16   Theodoro Grist, MD  budesonide-formoterol (SYMBICORT) 80-4.5 MCG/ACT inhaler Inhale 2 puffs into the lungs 2 (two) times daily.    Historical Provider, MD  diazepam (VALIUM) 5 MG tablet Take 1 tablet (5 mg total) by mouth every 8 (eight) hours as needed for anxiety. Patient not taking: Reported on 11/11/2016 12/28/15   Earleen Newport, MD  docusate sodium (COLACE) 100 MG capsule Take 100 mg by mouth 2 (two) times daily.    Historical Provider, MD  doxycycline (VIBRAMYCIN) 100 MG capsule Take 1 capsule (100 mg total) by mouth 2 (two) times daily. Patient not taking: Reported on 11/11/2016 07/30/16   Carrie Mew, MD  enoxaparin (LOVENOX) 100 MG/ML injection Inject 100 mg into the skin every 12 (twelve) hours.    Historical Provider, MD  feeding supplement (BOOST / RESOURCE BREEZE) LIQD Take 1 Container by mouth 3 (three) times daily between meals. Patient not taking: Reported on 11/11/2016 07/05/16   Theodoro Grist, MD  fluconazole (DIFLUCAN) 100 MG tablet Take 100 mg by mouth daily.    Historical Provider, MD  fluticasone (FLONASE) 50 MCG/ACT nasal spray Place 2 sprays into both nostrils daily. 10/04/15   Nicholes Mango, MD  guaiFENesin (ROBITUSSIN) 100 MG/5ML SOLN Take 5 mLs (100 mg total) by mouth every 4 (four) hours as needed for cough or to loosen phlegm. 07/30/16   Carrie Mew,  MD  HYDROmorphone (DILAUDID) 4 MG tablet Take by mouth every 4 (four) hours as needed for severe pain.    Historical Provider, MD  ipratropium-albuterol (DUONEB) 0.5-2.5 (3) MG/3ML SOLN Take 3 mLs by nebulization every 4 (four) hours as needed. 07/05/16   Theodoro Grist, MD  loperamide (IMODIUM) 2 MG capsule Take 2 mg by mouth as needed for diarrhea or loose stools.    Historical Provider, MD  methadone (DOLOPHINE) 5 MG tablet Take 10 mg by mouth 2 (two) times daily as needed.    Historical Provider, MD  mirtazapine (REMERON) 30 MG tablet Take 30 mg by mouth at bedtime.    Historical Provider, MD  omeprazole (PRILOSEC) 40 MG capsule Take 40 mg by mouth daily.    Historical Provider, MD  polyethylene glycol (MIRALAX / GLYCOLAX) packet Take 17 g by mouth daily as needed for mild constipation. 07/05/16   Theodoro Grist, MD  potassium chloride (K-DUR) 10 MEQ tablet Take 1 tablet (10 mEq total) by mouth 2 (two) times daily. 09/30/15   Fredia Sorrow, MD  potassium chloride SA (K-DUR,KLOR-CON) 20 MEQ tablet Take 20 mEq by mouth 2 (two) times daily.    Historical Provider, MD  predniSONE (DELTASONE) 20 MG tablet Take 3 tablets (60 mg total) by mouth daily. Patient not taking: Reported on 07/01/2016 04/02/16   Loney Hering, MD  promethazine (PHENERGAN) 25 MG tablet Take 1 tablet (  25 mg total) by mouth every 6 (six) hours as needed for nausea or vomiting. 09/30/15   Fredia Sorrow, MD  senna-docusate (SENOKOT-S) 8.6-50 MG tablet Take 1 tablet by mouth 2 (two) times daily.    Historical Provider, MD  sodium chloride (OCEAN) 0.65 % SOLN nasal spray Place 2 sprays into both nostrils as needed for congestion. 07/05/16   Theodoro Grist, MD  tamsulosin (FLOMAX) 0.4 MG CAPS capsule Take 0.4 mg by mouth daily.    Historical Provider, MD    Allergies Bee venom and Ativan [lorazepam]  Family History  Problem Relation Age of Onset  . Mesothelioma Father   . Breast cancer Mother     Social History Social  History  Substance Use Topics  . Smoking status: Former Smoker    Types: Cigarettes, Pipe, Cigars    Quit date: 10/20/1996  . Smokeless tobacco: Never Used  . Alcohol use No    Review of Systems Constitutional: as above Eyes: No visual changes. ENT: No sore throat. Cardiovascular: Denies chest pain. Respiratory: Denies shortness of breath. Gastrointestinal: No abdominal pain.  No nausea, no vomiting.  No diarrhea.  No constipation. Genitourinary: Negative for dysuria. Musculoskeletal: Negative for back pain. Skin: as above Neurological: Negative for headaches, focal weakness or numbness.  10-point ROS otherwise negative.  ____________________________________________   PHYSICAL EXAM:  VITAL SIGNS: ED Triage Vitals [11/30/16 2129]  Enc Vitals Group     BP 131/63     Pulse Rate (!) 127     Resp (!) 22     Temp (!) 102.7 F (39.3 C)     Temp Source Oral     SpO2 98 %     Weight 206 lb (93.4 kg)     Height 5\' 10"  (1.778 m)     Head Circumference      Peak Flow      Pain Score      Pain Loc      Pain Edu?      Excl. in [place]?     Constitutional: Alert and oriented. Weak appearing but fully interactive and appropriate. Eyes: Conjunctivae are normal. PERRL. EOMI. Head: Atraumatic. Nose: No congestion/rhinnorhea. Mouth/Throat: Mucous membranes are moist.  Oropharynx non-erythematous. No intraoral lesions. Neck: No stridor.  Patient ranges head and neck freely without any restriction. Cardiovascular: Tachycardic, regular rhythm. Grossly normal heart sounds.   Respiratory: Normal respiratory effort.  No retractions. Lungs CTAB. Gastrointestinal: Soft and nontender. No distention.  No CVA tenderness. Musculoskeletal: No lower extremity tenderness nor edema.  No joint effusions. Neurologic:  Normal speech and language. No gross focal neurologic deficits are appreciated. No gait instability. Skin:  Blanching, macular rash over the neck, anterior chest and abdomen as well as  the back. Also to the bilateral upper extremities with less rash to the bilateral lower extremities. No lesions on the palms or soles. No blood. No petechiae. Psychiatric: Mood and affect are normal. Speech and behavior are normal.  ____________________________________________   LABS (all labs ordered are listed, but only abnormal results are displayed)  Labs Reviewed  COMPREHENSIVE METABOLIC PANEL - Abnormal; Notable for the following:       Result Value   Glucose, Bld 201 (*)    Calcium 8.8 (*)    All other components within normal limits  CBC WITH DIFFERENTIAL/PLATELET - Abnormal; Notable for the following:    RBC 4.28 (*)    Hemoglobin 12.0 (*)    HCT 35.9 (*)    RDW 14.6 (*)  Neutro Abs 7.8 (*)    Lymphs Abs 0.5 (*)    All other components within normal limits  LACTIC ACID, PLASMA - Abnormal; Notable for the following:    Lactic Acid, Venous 2.2 (*)    All other components within normal limits  CULTURE, BLOOD (ROUTINE X 2)  CULTURE, BLOOD (ROUTINE X 2)  URINE CULTURE  INFLUENZA PANEL BY PCR (TYPE A & B)  PROCALCITONIN  LACTIC ACID, PLASMA  URINALYSIS, COMPLETE (UACMP) WITH MICROSCOPIC  VANCOMYCIN, TROUGH   ____________________________________________  EKG  Pending EKG  ____________________________________________  RADIOLOGY  DG Chest 1 View (Final result)  Result time 11/30/16 22:49:11  Final result by Abigail Miyamoto, MD (11/30/16 22:49:11)           Narrative:   CLINICAL DATA: Fever. Shortness of breath. Lymphoma in 2014.  EXAM: CHEST 1 VIEW  COMPARISON: 07/30/2016  FINDINGS: Right-sided Port-A-Cath terminates at the superior caval/ atrial junction. Midline trachea. Normal heart size and mediastinal contours. No pleural effusion or pneumothorax. Clear lungs.  IMPRESSION: No acute cardiopulmonary disease.   Electronically Signed By: Abigail Miyamoto M.D. On: 11/30/2016 22:49           ____________________________________________   PROCEDURES  Procedure(s) performed:   Procedures  Critical Care performed:   ____________________________________________   INITIAL IMPRESSION / ASSESSMENT AND PLAN / ED COURSE  Pertinent labs & imaging results that were available during my care of the patient were reviewed by me and considered in my medical decision making (see chart for details).  Sepsis alert called initially.    ----------------------------------------- 12:18 AM on 12/01/2016 -----------------------------------------  Patient given empiric broad-spectrum antibiotics. Plan is to transfer the patient to the [place] paperwork completed at this time. Patient aware of need for transfer. Unclear source at this time but still pending blood work as well as urine. Signed out to Dr. Karma Greaser. Unclear cause of the rash. No meningismus. Patient did not report any focal headache or neck pain.  Clinical Course as of Dec 01 16  Sat Nov 30, 2016  2335 Assuming care from Dr. Diannia Ruder.  In short, Alexander [name] is a 61 y.o. male with a chief complaint of sepsis while undergoing chemotherapy at the [place] for lymphoma.  Refer to the original H&P for additional details.  The current plan of care is to transfer to the [place] if possible vs admission at [facility] if unable to transfer.  Hemodynamically stable.  Sepsis protocol including fluids and broad spectrum antibiotics.  [CF]    Clinical Course User Index [CF] Hinda Kehr, MD     ____________________________________________   FINAL CLINICAL IMPRESSION(S) / ED DIAGNOSES  Rash. Fever. Sepsis.    NEW MEDICATIONS STARTED DURING THIS VISIT:  New Prescriptions   No medications on file     Note:  This document was prepared using Dragon voice recognition software and may include unintentional dictation errors.    Orbie Pyo, MD 12/01/16 [phone]

## 2016-12-01 NOTE — Progress Notes (Signed)
Admitted from home via the ED with pain and fever. IV fluids given. IV antibiotics given. Tylenol and morphine given for fever and pain.

## 2016-12-01 NOTE — ED Notes (Signed)
During lactic acid draw, attempted to draw blood from port, blood return noted but not smoothly, port flushed with no problem.  Pt denied pain at that time.  Blood sample drawn from PIV, vancomycin and fluids moved to port and remained on port.  This changed happened around 0044.  Pt c/o pain to port site about 5 minutes ago, port site appears edematous.  EDP and pharmacy informed.  After consulting both as well as charge nurse and oncology RN, port to be deaccessed and cold compress applied

## 2016-12-01 NOTE — ED Notes (Signed)
Pt denies pain at this time to port site, edema has mostly resolved

## 2016-12-01 NOTE — Progress Notes (Signed)
[place] [place] NAME: Alexander [name]    MR#:  [id number]  DATE OF BIRTH:  January 26, 1956  SUBJECTIVE:  CHIEF COMPLAINT:   Chief Complaint  Patient presents with  . Generalized Body Aches  . Fever   Came with fever and sepsis, unknown origin so far. 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  REVIEW OF SYSTEMS:  CONSTITUTIONAL: positive for fever, fatigue or weakness.  EYES: No blurred or double vision.  EARS, NOSE, AND THROAT: No tinnitus or ear pain.  RESPIRATORY: No cough, shortness of breath, wheezing or hemoptysis.  CARDIOVASCULAR: No chest pain, orthopnea, edema.  GASTROINTESTINAL: No nausea, vomiting, diarrhea or abdominal pain.  GENITOURINARY: No dysuria, hematuria.  ENDOCRINE: No polyuria, nocturia,  HEMATOLOGY: No anemia, easy bruising or bleeding SKIN: No rash or lesion. MUSCULOSKELETAL: No joint pain or arthritis.   NEUROLOGIC: No tingling, numbness, weakness.  PSYCHIATRY: No anxiety or depression.   ROS  DRUG ALLERGIES:   Allergies  Allergen Reactions  . Bee Venom Shortness Of Breath and Swelling  . Ativan [Lorazepam] Other (See Comments)    Reaction:  Dizziness     VITALS:  Blood pressure (!) 102/52, pulse 82, temperature 98.4 F (36.9 C), temperature source Oral, resp. rate 16, height 5\' 10"  (1.778 m), weight 95.2 kg (209 lb 12.8 oz), SpO2 99 %.  PHYSICAL EXAMINATION:  GENERAL:  61 y.o.-year-old patient lying in the bed with no acute distress.  EYES: Pupils equal, round, reactive to light and accommodation. No scleral icterus. Extraocular muscles intact.  HEENT: Head atraumatic, normocephalic. Oropharynx have whitish coating.  NECK:  Supple, no jugular venous distention. No thyroid enlargement, no tenderness.  LUNGS: Normal breath sounds bilaterally, no wheezing, rales,rhonchi or crepitation. No use of accessory muscles of respiration.  CARDIOVASCULAR: S1, S2 normal. No  murmurs, rubs, or gallops.  ABDOMEN: Soft, nontender, nondistended. Bowel sounds present. No organomegaly or mass.  EXTREMITIES: No pedal edema, cyanosis, or clubbing.  NEUROLOGIC: Cranial nerves II through XII are intact. Muscle strength 5/5 in all extremities. Sensation intact. Gait not checked.  PSYCHIATRIC: The patient is alert and oriented x 3.  SKIN: No obvious rash, lesion, or ulcer.   Physical Exam LABORATORY PANEL:   CBC  Recent Labs Lab 12/01/16 0707  WBC 8.9  HGB 9.9*  HCT 29.8*  PLT 141*   ------------------------------------------------------------------------------------------------------------------  Chemistries   Recent Labs Lab 11/30/16 2204 12/01/16 0707  NA 136 137  K 4.5 3.8  CL 102 107  CO2 26 23  GLUCOSE 201* 146*  BUN 13 12  CREATININE 0.94 0.97  CALCIUM 8.8* 7.3*  AST 22  --   ALT 19  --   ALKPHOS 87  --   BILITOT 0.7  --    ------------------------------------------------------------------------------------------------------------------  Cardiac Enzymes No results for input(s): TROPONINI in the last 168 hours. ------------------------------------------------------------------------------------------------------------------  RADIOLOGY:  Dg Chest 1 View  Result Date: 11/30/2016 CLINICAL DATA:  Fever.  Shortness of breath.  Lymphoma in 2014. EXAM: CHEST 1 VIEW COMPARISON:  07/30/2016 FINDINGS: Right-sided Port-A-Cath terminates at the superior caval/ atrial junction. Midline trachea. Normal heart size and mediastinal contours. No pleural effusion or pneumothorax. Clear lungs. IMPRESSION: No acute cardiopulmonary disease. Electronically Signed   By: Abigail Miyamoto M.D.   On: 11/30/2016 22:49    ASSESSMENT AND PLAN:   Active Problems:   Sepsis ([place])  * sepsis   IV vanc + zosyn   Cx sent- awaited source  finding.   Negative UA, Xray chest.  * mental cell lymphoma   s/p recent chemo   Called oncology consult.  * throat pain   Some   White coating on oral pharynx   Check Strep A swab.  * dysphagia complain    Check slp eval.  * chronic pain- cancer releated   Cont pain meds, give IV dilaudid.     All the records are reviewed and case discussed with Care Management/Social Workerr. Management plans discussed with the patient, family and they are in agreement.  CODE STATUS: DNR  TOTAL TIME TAKING CARE OF THIS PATIENT: 35 minutes.    POSSIBLE D/C IN 1-2 DAYS, DEPENDING ON CLINICAL CONDITION.   Vaughan Basta M.D on 12/01/2016   Between 7am to 6pm - Pager - [phone]  After 6pm go to [email or web address] - password [email or web address] [facility]  Office  [phone]  CC: Primary care physician; Perrin Maltese, MD  Note: This dictation was prepared with Dragon dictation along with smaller phrase technology. Any transcriptional errors that result from this process are unintentional.

## 2016-12-01 NOTE — ED Notes (Signed)
Hospitalist bedside 

## 2016-12-01 NOTE — Plan of Care (Signed)
Problem: Pain Managment: Goal: General experience of comfort will improve Outcome: Not Progressing Patient continues to complain of pain.

## 2016-12-01 NOTE — Progress Notes (Signed)
Pharmacy Antibiotic Note  Alexander [name] is a 61 y.o. male admitted on 11/30/2016 with sepsis.  Pharmacy has been consulted for vancomcyin/zosyn dosing.  Plan: Gave pt vanc 1g IV x 1 in ED will give an additional vanc 750 mg IV x 1 for a total 1.75g IV load (20 mg/kg load) Will follow up w/ vanc 1g q8h to start 4/15 @ 0800, will draw VT 4/15 @ 2300 prior to 4th dose. Ke 0.082 t1/2 8 hours CrCl 93.6 ml/min  Will initiate zosyn 3.375g IV q8h Will continue to monitor renal function and draw trough sooner if renal function declines.  4/15 @ 2253 VT 19; therapeutic within 15 - 20 mcg/mL range. Will continue current regimen and recheck VT 4/16 @ 2300 to confirm therapeutic range. Renal function currently stable.  Height: 5\' 10"  (177.8 cm) Weight: 206 lb (93.4 kg) IBW/kg (Calculated) : 73  Temp (24hrs), Avg:102.7 F (39.3 C), Min:102.7 F (39.3 C), Max:102.7 F (39.3 C)   Last Labs    Recent Labs Lab 11/30/16 2204 11/30/16 2205  WBC 8.7  --   CREATININE 0.94  --   LATICACIDVEN  --  2.2*      Estimated Creatinine Clearance: 93.6 mL/min (by C-G formula based on SCr of 0.94 mg/dL).         Allergies  Allergen Reactions  . Bee Venom Shortness Of Breath and Swelling  . Ativan [Lorazepam] Other (See Comments)    Reaction:  Dizziness     Antimicrobials this admission: 4/14 Vanc >>  4/14 Zosyn >>   Microbiology results: 4/14 BCx: sent -- patient has a port-a-cath placed, CXR NG  LA 2.2  Thank you for allowing pharmacy to be a part of this patient's care.  Thank you for this consult.  Tobie Lords, PharmD, BCPS Clinical Pharmacist 12/01/2016

## 2016-12-01 NOTE — Plan of Care (Signed)
Problem: Respiratory: Goal: Ability to maintain adequate ventilation will improve Outcome: Progressing Patient maintaining O2 sats >92% on room air, no complaints of shortness of breath. Will continue to monitor O2 sat routinely and manage appropriately.

## 2016-12-01 NOTE — Consult Note (Signed)
[facility]  Date of admission:  12/01/2016  Inpatient day:  12/01/2016  Consulting physician:  Dr. Vaughan Basta  Reason for Consultation:  Mantle cell lymphoma  Chief Complaint: Alexander [name] is a 61 y.o. male with recurrent mantle cell lymphoma who was admitted with fever on day 13 s/p cycle #1 bendamustine with Rituxan (BR).  HPI:  The patient was diagnosed with mantle cell lymphoma in 2015.  He received chemotherapy at the [facility].  He recurred this year.  He received cycle #1 bendamustine and Rituxan (BR) on 11/19/2016.  He tolerated his infusions well.  He states that on Friday, 11/29/2016, he developed achiness and decreased movements in fingers, arms, and neck.  Fever was 103-105.  He notes a slight sore throat.  He denies any increased shortness of breath over baseline.  He denied cough, urinary symptoms, nausea, vomiting or diarrhea.  He had a "total body rash" yesterday, which has resolved.  He presented to the ER with generalized body aches and fever.  CXR revealed no air space disease.  Urinalysis was negative.  Influenza A and B were negative.  Blood cultures are pending.  Lactic acid was 2.2 (high).  He was started on vancomycin and Zosyn.   Past Medical History:  Diagnosis Date  . Asthma   . Barrett's esophagus with high grade dysplasia   . BPH (benign prostatic hyperplasia)   . COPD (chronic obstructive pulmonary disease) ([place])   . Deviated nasal septum   . DVT (deep venous thrombosis) ([place])   . Erosive esophagitis   . GERD (gastroesophageal reflux disease)   . History of ischemic colitis   . Mantle cell lymphoma ([place])   . Migraine     Past Surgical History:  Procedure Laterality Date  . barryx N/A   . ESOPHAGOGASTRODUODENOSCOPY (EGD) WITH PROPOFOL N/A 02/23/2015   Procedure: ESOPHAGOGASTRODUODENOSCOPY (EGD) WITH PROPOFOL;  Surgeon: Hulen Luster, MD;  Location: [facility] ENDOSCOPY;  Service: Gastroenterology;  Laterality: N/A;  .  ESOPHAGOGASTRODUODENOSCOPY (EGD) WITH PROPOFOL N/A 05/04/2015   Procedure: ESOPHAGOGASTRODUODENOSCOPY (EGD) WITH PROPOFOL;  Surgeon: Hulen Luster, MD;  Location: [facility] ENDOSCOPY;  Service: Gastroenterology;  Laterality: N/A;  . ESOPHAGOGASTRODUODENOSCOPY (EGD) WITH PROPOFOL N/A 07/27/2015   Procedure: ESOPHAGOGASTRODUODENOSCOPY (EGD) WITH PROPOFOL;  Surgeon: Hulen Luster, MD;  Location: [facility] ENDOSCOPY;  Service: Gastroenterology;  Laterality: N/A;  . ESOPHAGOGASTRODUODENOSCOPY (EGD) WITH PROPOFOL N/A 10/05/2015   Procedure: ESOPHAGOGASTRODUODENOSCOPY (EGD) WITH PROPOFOL;  Surgeon: Hulen Luster, MD;  Location: [facility] ENDOSCOPY;  Service: Gastroenterology;  Laterality: N/A;  . ESOPHAGOGASTRODUODENOSCOPY (EGD) WITH PROPOFOL N/A 01/04/2016   Procedure: ESOPHAGOGASTRODUODENOSCOPY (EGD) WITH PROPOFOL;  Surgeon: Hulen Luster, MD;  Location: [facility] ENDOSCOPY;  Service: Gastroenterology;  Laterality: N/A;  . ESOPHAGOGASTRODUODENOSCOPY (EGD) WITH PROPOFOL N/A 11/11/2016   Procedure: ESOPHAGOGASTRODUODENOSCOPY (EGD) WITH PROPOFOL;  Surgeon: Lollie Sails, MD;  Location: [facility] ENDOSCOPY;  Service: Endoscopy;  Laterality: N/A;  . NASAL SEPTUM SURGERY    . THROAT SURGERY    . TONSILLECTOMY    . UPPER ESOPHAGEAL ENDOSCOPIC ULTRASOUND (EUS) N/A 10/19/2015   Procedure: UPPER ESOPHAGEAL ENDOSCOPIC ULTRASOUND (EUS);  Surgeon: Holly Bodily, MD;  Location: [facility] ENDOSCOPY;  Service: Gastroenterology;  Laterality: N/A;    Family History  Problem Relation Age of Onset  . Mesothelioma Father   . Breast cancer Mother   . Diabetes Neg Hx   . Hypertension Neg Hx     Social History:  reports that he quit smoking about 20 years ago. His smoking use included  Cigarettes, Pipe, and Cigars. He has never used smokeless tobacco. He reports that he does not drink alcohol or use drugs.  He is alone today.  Allergies:  Allergies  Allergen Reactions  . Bee Venom Shortness Of Breath and Swelling  . Ativan [Lorazepam] Other (See Comments)     Reaction:  Dizziness     Medications Prior to Admission  Medication Sig Dispense Refill  . acetaminophen (TYLENOL) 500 MG tablet Take 1,000 mg by mouth every 8 (eight) hours as needed.    Marland Kitchen albuterol (PROVENTIL HFA) 108 (90 Base) MCG/ACT inhaler Inhale 2 puffs into the lungs every 4 (four) hours as needed for wheezing or shortness of breath. 1 Inhaler 0  . budesonide-formoterol (SYMBICORT) 80-4.5 MCG/ACT inhaler Inhale 2 puffs into the lungs 2 (two) times daily.    Marland Kitchen docusate sodium (COLACE) 100 MG capsule Take 100 mg by mouth 2 (two) times daily.    Marland Kitchen enoxaparin (LOVENOX) 100 MG/ML injection Inject 100 mg into the skin every 12 (twelve) hours.    . fluticasone (FLONASE) 50 MCG/ACT nasal spray Place 2 sprays into both nostrils daily. 16 g 0  . guaiFENesin (ROBITUSSIN) 100 MG/5ML SOLN Take 5 mLs (100 mg total) by mouth every 4 (four) hours as needed for cough or to loosen phlegm. 120 mL 0  . HYDROmorphone (DILAUDID) 4 MG tablet Take by mouth every 4 (four) hours as needed for severe pain.    Marland Kitchen ipratropium-albuterol (DUONEB) 0.5-2.5 (3) MG/3ML SOLN Take 3 mLs by nebulization every 4 (four) hours as needed. 360 mL 6  . loperamide (IMODIUM) 2 MG capsule Take 2 mg by mouth as needed for diarrhea or loose stools.    . methadone (DOLOPHINE) 5 MG tablet Take 10 mg by mouth 2 (two) times daily as needed.    . mirtazapine (REMERON) 30 MG tablet Take 30 mg by mouth at bedtime.    . polyethylene glycol (MIRALAX / GLYCOLAX) packet Take 17 g by mouth daily as needed for mild constipation. 14 each 0  . potassium chloride SA (K-DUR,KLOR-CON) 20 MEQ tablet Take 20 mEq by mouth daily.     . promethazine (PHENERGAN) 25 MG tablet Take 1 tablet (25 mg total) by mouth every 6 (six) hours as needed for nausea or vomiting. 12 tablet 1  . senna-docusate (SENOKOT-S) 8.6-50 MG tablet Take 1 tablet by mouth 2 (two) times daily.    . sodium chloride (OCEAN) 0.65 % SOLN nasal spray Place 2 sprays into both nostrils as  needed for congestion. 1 Bottle 0  . tamsulosin (FLOMAX) 0.4 MG CAPS capsule Take 0.4 mg by mouth daily.    Marland Kitchen azithromycin (ZITHROMAX) 250 MG tablet Take 1 tablet (250 mg total) by mouth daily. (Patient not taking: Reported on 11/11/2016) 6 each 0  . diazepam (VALIUM) 5 MG tablet Take 1 tablet (5 mg total) by mouth every 8 (eight) hours as needed for anxiety. (Patient not taking: Reported on 11/11/2016) 20 tablet 0  . doxycycline (VIBRAMYCIN) 100 MG capsule Take 1 capsule (100 mg total) by mouth 2 (two) times daily. (Patient not taking: Reported on 11/11/2016) 20 capsule 0  . feeding supplement (BOOST / RESOURCE BREEZE) LIQD Take 1 Container by mouth 3 (three) times daily between meals. (Patient not taking: Reported on 11/11/2016) 90 Container 6  . fluconazole (DIFLUCAN) 100 MG tablet Take 100 mg by mouth daily.    Marland Kitchen omeprazole (PRILOSEC) 40 MG capsule Take 40 mg by mouth daily.    . potassium chloride (K-DUR)  10 MEQ tablet Take 1 tablet (10 mEq total) by mouth 2 (two) times daily. (Patient not taking: Reported on 12/01/2016) 6 tablet 0  . predniSONE (DELTASONE) 20 MG tablet Take 3 tablets (60 mg total) by mouth daily. (Patient not taking: Reported on 07/01/2016) 12 tablet 0    Review of Systems: GENERAL:  Fatigue.  Fever 103-105 at home.  No sweats. PERFORMANCE STATUS (ECOG):  1 HEENT:  Slight sore throat.  No visual changes, runny nose, mouth sores or tenderness. Lungs: Shortness of breath, chronic.  No cough.  No hemoptysis. Cardiac:  No chest pain, palpitations, orthopnea, or PND. GI:  No nausea, vomiting, diarrhea, constipation, melena or hematochezia. GU:  Increased urine output.  No urgency, frequency, dysuria, or hematuria. Musculoskeletal:  No back pain.  No joint pain.  No muscle tenderness. Extremities:  No pain or swelling. Skin:  No rashes or skin changes. Neuro:  No headache, numbness or weakness, balance or coordination issues. Endocrine:  No diabetes, thyroid issues, hot flashes or  night sweats. Psych:  No mood changes, depression or anxiety. Pain:  No focal pain. Review of systems:  All other systems reviewed and found to be negative.  Physical Exam:  Blood pressure (!) 106/49, pulse 81, temperature 99.3 F (37.4 C), temperature source Oral, resp. rate 18, height '5\' 10"'$  (1.778 m), weight 209 lb 12.8 oz (95.2 kg), SpO2 98 %.  GENERAL:  Fatigued appearing gentleman lying comfortably on the medical unit in no acute distress. MENTAL STATUS:  Alert and oriented to person, place and time. HEAD:  Brown hair.  Normocephalic, atraumatic, face symmetric, no Cushingoid features. EYES:  Blue eyes.  Pupils equal round and reactive to light and accomodation.  No conjunctivitis or scleral icterus. ENT:  Oropharynx clear without lesion.  Unable to visualize posterior pharynx.  Tongue normal. Mucous membranes dry.  RESPIRATORY:  Clear to auscultation without rales, wheezes or rhonchi. CARDIOVASCULAR:  Regular rate and rhythm without murmur, rub or gallop. CHEST:  Unremarkable right upper chest port-a-cath. ABDOMEN:  Soft, slightly tender without guarding or rebound tenderness.  Active bowel sounds and no hepatosplenomegaly.  No masses. SKIN:  No rashes, ulcers or lesions. EXTREMITIES: No edema, no skin discoloration or tenderness.  No palpable cords. LYMPH NODES: No palpable cervical, supraclavicular, axillary or inguinal adenopathy  NEUROLOGICAL: Unremarkable. PSYCH:  Appropriate.   Results for orders placed or performed during the hospital encounter of 11/30/16 (from the past 48 hour(s))  Comprehensive metabolic panel     Status: Abnormal   Collection Time: 11/30/16 10:04 PM  Result Value Ref Range   Sodium 136 135 - 145 mmol/L   Potassium 4.5 3.5 - 5.1 mmol/L   Chloride 102 101 - 111 mmol/L   CO2 26 22 - 32 mmol/L   Glucose, Bld 201 (H) 65 - 99 mg/dL   BUN 13 6 - 20 mg/dL   Creatinine, Ser 0.94 0.61 - 1.24 mg/dL   Calcium 8.8 (L) 8.9 - 10.3 mg/dL   Total Protein 7.4 6.5  - 8.1 g/dL   Albumin 4.5 3.5 - 5.0 g/dL   AST 22 15 - 41 U/L   ALT 19 17 - 63 U/L   Alkaline Phosphatase 87 38 - 126 U/L   Total Bilirubin 0.7 0.3 - 1.2 mg/dL   GFR calc non Af Amer >60 >60 mL/min   GFR calc Af Amer >60 >60 mL/min    Comment: (NOTE) The eGFR has been calculated using the CKD EPI equation. This calculation has not been  validated in all clinical situations. eGFR's persistently <60 mL/min signify possible Chronic Kidney Disease.    Anion gap 8 5 - 15  CBC WITH DIFFERENTIAL     Status: Abnormal   Collection Time: 11/30/16 10:04 PM  Result Value Ref Range   WBC 8.7 3.8 - 10.6 K/uL   RBC 4.28 (L) 4.40 - 5.90 MIL/uL   Hemoglobin 12.0 (L) 13.0 - 18.0 g/dL   HCT 35.9 (L) 40.0 - 52.0 %   MCV 83.8 80.0 - 100.0 fL   MCH 28.1 26.0 - 34.0 pg   MCHC 33.5 32.0 - 36.0 g/dL   RDW 14.6 (H) 11.5 - 14.5 %   Platelets 166 150 - 440 K/uL   Neutrophils Relative % 90 %   Neutro Abs 7.8 (H) 1.4 - 6.5 K/uL   Lymphocytes Relative 6 %   Lymphs Abs 0.5 (L) 1.0 - 3.6 K/uL   Monocytes Relative 4 %   Monocytes Absolute 0.3 0.2 - 1.0 K/uL   Eosinophils Relative 0 %   Eosinophils Absolute 0.0 0 - 0.7 K/uL   Basophils Relative 0 %   Basophils Absolute 0.0 0 - 0.1 K/uL  Blood Culture (routine x 2)     Status: None (Preliminary result)   Collection Time: 11/30/16 10:04 PM  Result Value Ref Range   Specimen Description BLOOD LEFT ANTECUBITAL    Special Requests      BOTTLES DRAWN AEROBIC AND ANAEROBIC Blood Culture results may not be optimal due to an excessive volume of blood received in culture bottles   Culture NO GROWTH < 12 HOURS    Report Status PENDING   Blood Culture (routine x 2)     Status: None (Preliminary result)   Collection Time: 11/30/16 10:04 PM  Result Value Ref Range   Specimen Description BLOOD LEFT ANTECUBITAL    Special Requests      BOTTLES DRAWN AEROBIC AND ANAEROBIC Blood Culture adequate volume 2ND SET IS LABELED AS LEFT A.C. DRAWN AT 2204, EXACTLY SAME TIME AND  LOCATION AS 1ST SET   Culture NO GROWTH < 12 HOURS    Report Status PENDING   Procalcitonin     Status: None   Collection Time: 11/30/16 10:04 PM  Result Value Ref Range   Procalcitonin 0.40 ng/mL    Comment:        Interpretation: PCT (Procalcitonin) <= 0.5 ng/mL: Systemic infection (sepsis) is not likely. Local bacterial infection is possible. (NOTE)         ICU PCT Algorithm               Non ICU PCT Algorithm    ----------------------------     ------------------------------         PCT < 0.25 ng/mL                 PCT < 0.1 ng/mL     Stopping of antibiotics            Stopping of antibiotics       strongly encouraged.               strongly encouraged.    ----------------------------     ------------------------------       PCT level decrease by               PCT < 0.25 ng/mL       >= 80% from peak PCT       OR PCT 0.25 - 0.5 ng/mL  Stopping of antibiotics                                             encouraged.     Stopping of antibiotics           encouraged.    ----------------------------     ------------------------------       PCT level decrease by              PCT >= 0.25 ng/mL       < 80% from peak PCT        AND PCT >= 0.5 ng/mL            Continuin g antibiotics                                              encouraged.       Continuing antibiotics            encouraged.    ----------------------------     ------------------------------     PCT level increase compared          PCT > 0.5 ng/mL         with peak PCT AND          PCT >= 0.5 ng/mL             Escalation of antibiotics                                          strongly encouraged.      Escalation of antibiotics        strongly encouraged.   Lactic acid, plasma     Status: Abnormal   Collection Time: 11/30/16 10:05 PM  Result Value Ref Range   Lactic Acid, Venous 2.2 (HH) 0.5 - 1.9 mmol/L    Comment: CRITICAL RESULT CALLED TO, READ BACK BY AND VERIFIED WITH DAWN TULLOCH AT 2323 ON 11/30/16  RWW   Influenza panel by PCR (type A & B)     Status: None   Collection Time: 11/30/16 10:22 PM  Result Value Ref Range   Influenza A By PCR NEGATIVE NEGATIVE   Influenza B By PCR NEGATIVE NEGATIVE    Comment: (NOTE) The Xpert Xpress Flu assay is intended as an aid in the diagnosis of  influenza and should not be used as a sole basis for treatment.  This  assay is FDA approved for nasopharyngeal swab specimens only. Nasal  washings and aspirates are unacceptable for Xpert Xpress Flu testing.   Lactic acid, plasma     Status: None   Collection Time: 12/01/16 12:44 AM  Result Value Ref Range   Lactic Acid, Venous 1.7 0.5 - 1.9 mmol/L  Urinalysis, Complete w Microscopic     Status: Abnormal   Collection Time: 12/01/16  2:01 AM  Result Value Ref Range   Color, Urine YELLOW (A) YELLOW   APPearance CLEAR (A) CLEAR   Specific Gravity, Urine 1.018 1.005 - 1.030   pH 5.0 5.0 - 8.0   Glucose, UA NEGATIVE NEGATIVE mg/dL   Hgb urine dipstick NEGATIVE NEGATIVE   Bilirubin Urine NEGATIVE NEGATIVE   Ketones,  ur NEGATIVE NEGATIVE mg/dL   Protein, ur NEGATIVE NEGATIVE mg/dL   Nitrite NEGATIVE NEGATIVE   Leukocytes, UA NEGATIVE NEGATIVE   RBC / HPF 0-5 0 - 5 RBC/hpf   WBC, UA 0-5 0 - 5 WBC/hpf   Bacteria, UA NONE SEEN NONE SEEN   Squamous Epithelial / LPF 0-5 (A) NONE SEEN  Basic metabolic panel     Status: Abnormal   Collection Time: 12/01/16  7:07 AM  Result Value Ref Range   Sodium 137 135 - 145 mmol/L   Potassium 3.8 3.5 - 5.1 mmol/L   Chloride 107 101 - 111 mmol/L   CO2 23 22 - 32 mmol/L   Glucose, Bld 146 (H) 65 - 99 mg/dL   BUN 12 6 - 20 mg/dL   Creatinine, Ser 0.97 0.61 - 1.24 mg/dL   Calcium 7.3 (L) 8.9 - 10.3 mg/dL   GFR calc non Af Amer >60 >60 mL/min   GFR calc Af Amer >60 >60 mL/min    Comment: (NOTE) The eGFR has been calculated using the CKD EPI equation. This calculation has not been validated in all clinical situations. eGFR's persistently <60 mL/min signify possible  Chronic Kidney Disease.    Anion gap 7 5 - 15  CBC     Status: Abnormal   Collection Time: 12/01/16  7:07 AM  Result Value Ref Range   WBC 8.9 3.8 - 10.6 K/uL   RBC 3.56 (L) 4.40 - 5.90 MIL/uL   Hemoglobin 9.9 (L) 13.0 - 18.0 g/dL   HCT 29.8 (L) 40.0 - 52.0 %   MCV 83.7 80.0 - 100.0 fL   MCH 27.9 26.0 - 34.0 pg   MCHC 33.3 32.0 - 36.0 g/dL   RDW 14.7 (H) 11.5 - 14.5 %   Platelets 141 (L) 150 - 440 K/uL   Dg Chest 1 View  Result Date: 11/30/2016 CLINICAL DATA:  Fever.  Shortness of breath.  Lymphoma in 2014. EXAM: CHEST 1 VIEW COMPARISON:  07/30/2016 FINDINGS: Right-sided Port-A-Cath terminates at the superior caval/ atrial junction. Midline trachea. Normal heart size and mediastinal contours. No pleural effusion or pneumothorax. Clear lungs. IMPRESSION: No acute cardiopulmonary disease. Electronically Signed   By: Abigail Miyamoto M.D.   On: 11/30/2016 22:49    Assessment:  The patient is a 61 y.o. gentleman with recurrent mantle cell lymphoma who was admitted on day 13 s/p cycle #1 bendamustine and Rituxan (BR).  He has general achiness possibly due to bendamustine or infection.  His rash has resolved.  The etiology of his rash may have been due to bendamustine.  Patient has a fever without source.  Lactic acid is elevated.  Cultures are pending.  Plan:   1.  Oncology:  Day 13 s/p cycle #1 BR.  Counts are adequate.  Follow counts daily.   2.  Hematology:  Patient has a history of DVT on Lovenox.  Platelet count adequate. 3.  Infectious disease:  Patient pan cultured and started on broad spectrum antibiotics (vancomycin and Zosyn).  No obvious source of infection.  Blood culture q 24 hours prn temp >= 100.4. 4.  Code status:  DNR per chart.    Thank you for allowing me to participate in [place] 's care.  I will follow him closely with you while hospitalized.   Lequita Asal, MD  12/01/2016, 4:03 PM

## 2016-12-01 NOTE — ED Notes (Signed)
Delay to call report d/t changing out pt in triage

## 2016-12-01 NOTE — H&P (Addendum)
[place] [place] [facility] [place] NAME: Alexander [name]    MR#:  [id number]  DATE OF BIRTH:  11-06-1955  DATE OF ADMISSION:  11/30/2016  PRIMARY CARE PHYSICIAN: Perrin Maltese, MD   REQUESTING/REFERRING PHYSICIAN:   CHIEF COMPLAINT:   Chief Complaint  Patient presents with  . Generalized Body Aches  . Fever    HISTORY OF PRESENT ILLNESS: Alexander [name]  is a 61 y.o. male with a known history of Mantle cell lymphoma, GERD, erosive esophagitis, DVT, COPD, prostate hyperplasia, Barrett's esophagus, bronchial asthma presented to the emergency room with generalized body aches, fever for the last 3 days. Patient has generalized body pain and fatigue. No complaints of any cough. Had some chills but no dysuria. Patient currently on full dose Lovenox at home for anticoagulation. He was evaluated in the emergency room and lactate level was elevated. Patient was given IV fluids based on sepsis protocol and given broad-spectrum antibiotics that is IV vancomycin and IV Zosyn. Patient had fever of 103F. Patient had chemotherapy on the first week of this month. Code sepsis was called and hospitalist service was consulted for further care of the patient. Patient also has some macular rash over the chest wall abdominal area and an extremities for the last 2 days.  PAST MEDICAL HISTORY:   Past Medical History:  Diagnosis Date  . Asthma   . Barrett's esophagus with high grade dysplasia   . BPH (benign prostatic hyperplasia)   . COPD (chronic obstructive pulmonary disease) ([place])   . Deviated nasal septum   . DVT (deep venous thrombosis) ([place])   . Erosive esophagitis   . GERD (gastroesophageal reflux disease)   . History of ischemic colitis   . Mantle cell lymphoma ([place])   . Migraine     PAST SURGICAL HISTORY: Past Surgical History:  Procedure Laterality Date  . barryx N/A   . ESOPHAGOGASTRODUODENOSCOPY (EGD) WITH PROPOFOL N/A 02/23/2015   Procedure:  ESOPHAGOGASTRODUODENOSCOPY (EGD) WITH PROPOFOL;  Surgeon: Hulen Luster, MD;  Location: [facility] ENDOSCOPY;  Service: Gastroenterology;  Laterality: N/A;  . ESOPHAGOGASTRODUODENOSCOPY (EGD) WITH PROPOFOL N/A 05/04/2015   Procedure: ESOPHAGOGASTRODUODENOSCOPY (EGD) WITH PROPOFOL;  Surgeon: Hulen Luster, MD;  Location: [facility] ENDOSCOPY;  Service: Gastroenterology;  Laterality: N/A;  . ESOPHAGOGASTRODUODENOSCOPY (EGD) WITH PROPOFOL N/A 07/27/2015   Procedure: ESOPHAGOGASTRODUODENOSCOPY (EGD) WITH PROPOFOL;  Surgeon: Hulen Luster, MD;  Location: [facility] ENDOSCOPY;  Service: Gastroenterology;  Laterality: N/A;  . ESOPHAGOGASTRODUODENOSCOPY (EGD) WITH PROPOFOL N/A 10/05/2015   Procedure: ESOPHAGOGASTRODUODENOSCOPY (EGD) WITH PROPOFOL;  Surgeon: Hulen Luster, MD;  Location: [facility] ENDOSCOPY;  Service: Gastroenterology;  Laterality: N/A;  . ESOPHAGOGASTRODUODENOSCOPY (EGD) WITH PROPOFOL N/A 01/04/2016   Procedure: ESOPHAGOGASTRODUODENOSCOPY (EGD) WITH PROPOFOL;  Surgeon: Hulen Luster, MD;  Location: [facility] ENDOSCOPY;  Service: Gastroenterology;  Laterality: N/A;  . ESOPHAGOGASTRODUODENOSCOPY (EGD) WITH PROPOFOL N/A 11/11/2016   Procedure: ESOPHAGOGASTRODUODENOSCOPY (EGD) WITH PROPOFOL;  Surgeon: Lollie Sails, MD;  Location: [facility] ENDOSCOPY;  Service: Endoscopy;  Laterality: N/A;  . NASAL SEPTUM SURGERY    . THROAT SURGERY    . TONSILLECTOMY    . UPPER ESOPHAGEAL ENDOSCOPIC ULTRASOUND (EUS) N/A 10/19/2015   Procedure: UPPER ESOPHAGEAL ENDOSCOPIC ULTRASOUND (EUS);  Surgeon: Holly Bodily, MD;  Location: [facility] ENDOSCOPY;  Service: Gastroenterology;  Laterality: N/A;    SOCIAL HISTORY:  Social History  Substance Use Topics  . Smoking status: Former Smoker    Types: Cigarettes, Pipe, Cigars    Quit date: 10/20/1996  . Smokeless tobacco: Never  Used  . Alcohol use No    FAMILY HISTORY:  Family History  Problem Relation Age of Onset  . Mesothelioma Father   . Breast cancer Mother   . Diabetes Neg Hx   . Hypertension Neg Hx      DRUG ALLERGIES:  Allergies  Allergen Reactions  . Bee Venom Shortness Of Breath and Swelling  . Ativan [Lorazepam] Other (See Comments)    Reaction:  Dizziness     REVIEW OF SYSTEMS:   CONSTITUTIONAL: Has fever, fatigue and weakness.  EYES: No blurred or double vision.  EARS, NOSE, AND THROAT: No tinnitus or ear pain.  RESPIRATORY: No cough, shortness of breath, wheezing or hemoptysis.  CARDIOVASCULAR: No chest pain, orthopnea, edema.  GASTROINTESTINAL: No nausea, vomiting, diarrhea or abdominal pain.  GENITOURINARY: No dysuria, hematuria.  ENDOCRINE: No polyuria, nocturia,  HEMATOLOGY: No anemia, easy bruising or bleeding SKIN: Has macular rash over chest , extremities, abdominal area.. MUSCULOSKELETAL: No joint pain or arthritis.   NEUROLOGIC: No tingling, numbness, weakness.  PSYCHIATRY: No anxiety or depression.   MEDICATIONS AT HOME:  Prior to Admission medications   Medication Sig Start Date End Date Taking? Authorizing Provider  albuterol (PROVENTIL HFA) 108 (90 Base) MCG/ACT inhaler Inhale 2 puffs into the lungs every 4 (four) hours as needed for wheezing or shortness of breath. 07/30/16   Carrie Mew, MD  azithromycin (ZITHROMAX) 250 MG tablet Take 1 tablet (250 mg total) by mouth daily. Patient not taking: Reported on 11/11/2016 07/05/16   Theodoro Grist, MD  budesonide-formoterol (SYMBICORT) 80-4.5 MCG/ACT inhaler Inhale 2 puffs into the lungs 2 (two) times daily.    Historical Provider, MD  diazepam (VALIUM) 5 MG tablet Take 1 tablet (5 mg total) by mouth every 8 (eight) hours as needed for anxiety. Patient not taking: Reported on 11/11/2016 12/28/15   Earleen Newport, MD  docusate sodium (COLACE) 100 MG capsule Take 100 mg by mouth 2 (two) times daily.    Historical Provider, MD  doxycycline (VIBRAMYCIN) 100 MG capsule Take 1 capsule (100 mg total) by mouth 2 (two) times daily. Patient not taking: Reported on 11/11/2016 07/30/16   Carrie Mew, MD   enoxaparin (LOVENOX) 100 MG/ML injection Inject 100 mg into the skin every 12 (twelve) hours.    Historical Provider, MD  feeding supplement (BOOST / RESOURCE BREEZE) LIQD Take 1 Container by mouth 3 (three) times daily between meals. Patient not taking: Reported on 11/11/2016 07/05/16   Theodoro Grist, MD  fluconazole (DIFLUCAN) 100 MG tablet Take 100 mg by mouth daily.    Historical Provider, MD  fluticasone (FLONASE) 50 MCG/ACT nasal spray Place 2 sprays into both nostrils daily. 10/04/15   Nicholes Mango, MD  guaiFENesin (ROBITUSSIN) 100 MG/5ML SOLN Take 5 mLs (100 mg total) by mouth every 4 (four) hours as needed for cough or to loosen phlegm. 07/30/16   Carrie Mew, MD  HYDROmorphone (DILAUDID) 4 MG tablet Take by mouth every 4 (four) hours as needed for severe pain.    Historical Provider, MD  ipratropium-albuterol (DUONEB) 0.5-2.5 (3) MG/3ML SOLN Take 3 mLs by nebulization every 4 (four) hours as needed. 07/05/16   Theodoro Grist, MD  loperamide (IMODIUM) 2 MG capsule Take 2 mg by mouth as needed for diarrhea or loose stools.    Historical Provider, MD  methadone (DOLOPHINE) 5 MG tablet Take 10 mg by mouth 2 (two) times daily as needed.    Historical Provider, MD  mirtazapine (REMERON) 30 MG tablet Take 30 mg by  mouth at bedtime.    Historical Provider, MD  omeprazole (PRILOSEC) 40 MG capsule Take 40 mg by mouth daily.    Historical Provider, MD  polyethylene glycol (MIRALAX / GLYCOLAX) packet Take 17 g by mouth daily as needed for mild constipation. 07/05/16   Theodoro Grist, MD  potassium chloride (K-DUR) 10 MEQ tablet Take 1 tablet (10 mEq total) by mouth 2 (two) times daily. 09/30/15   Fredia Sorrow, MD  potassium chloride SA (K-DUR,KLOR-CON) 20 MEQ tablet Take 20 mEq by mouth 2 (two) times daily.    Historical Provider, MD  predniSONE (DELTASONE) 20 MG tablet Take 3 tablets (60 mg total) by mouth daily. Patient not taking: Reported on 07/01/2016 04/02/16   Loney Hering, MD   promethazine (PHENERGAN) 25 MG tablet Take 1 tablet (25 mg total) by mouth every 6 (six) hours as needed for nausea or vomiting. 09/30/15   Fredia Sorrow, MD  senna-docusate (SENOKOT-S) 8.6-50 MG tablet Take 1 tablet by mouth 2 (two) times daily.    Historical Provider, MD  sodium chloride (OCEAN) 0.65 % SOLN nasal spray Place 2 sprays into both nostrils as needed for congestion. 07/05/16   Theodoro Grist, MD  tamsulosin (FLOMAX) 0.4 MG CAPS capsule Take 0.4 mg by mouth daily.    Historical Provider, MD      PHYSICAL EXAMINATION:   VITAL SIGNS: Blood pressure (!) 101/51, pulse 99, temperature (!) 102.7 F (39.3 C), temperature source Oral, resp. rate 13, height 5\' 10"  (1.778 m), weight 93.4 kg (206 lb), SpO2 100 %.  GENERAL:  61 y.o.-year-old patient lying in the bed with no acute distress.  EYES: Pupils equal, round, reactive to light and accommodation. No scleral icterus. Extraocular muscles intact.  HEENT: Head atraumatic, normocephalic. Oropharynx and nasopharynx clear.  NECK:  Supple, no jugular venous distention. No thyroid enlargement, no tenderness.  LUNGS: Normal breath sounds bilaterally, no wheezing, rales,rhonchi or crepitation. No use of accessory muscles of respiration.  Has porta cath over chest wall CARDIOVASCULAR: S1, S2 normal. No murmurs, rubs, or gallops.  ABDOMEN: Soft, nontender, nondistended. Bowel sounds present. No organomegaly or mass.  EXTREMITIES: No pedal edema, cyanosis, or clubbing.  NEUROLOGIC: Cranial nerves II through XII are intact. Muscle strength 5/5 in all extremities. Sensation intact. Gait not checked.  PSYCHIATRIC: The patient is alert and oriented x 3.  SKIN: Has red macular rash over chest wall, abdominal area and extremities  LABORATORY PANEL:   CBC  Recent Labs Lab 11/30/16 2204  WBC 8.7  HGB 12.0*  HCT 35.9*  PLT 166  MCV 83.8  MCH 28.1  MCHC 33.5  RDW 14.6*  LYMPHSABS 0.5*  MONOABS 0.3  EOSABS 0.0  BASOSABS 0.0    ------------------------------------------------------------------------------------------------------------------  Chemistries   Recent Labs Lab 11/30/16 2204  NA 136  K 4.5  CL 102  CO2 26  GLUCOSE 201*  BUN 13  CREATININE 0.94  CALCIUM 8.8*  AST 22  ALT 19  ALKPHOS 87  BILITOT 0.7   ------------------------------------------------------------------------------------------------------------------ estimated creatinine clearance is 93.6 mL/min (by C-G formula based on SCr of 0.94 mg/dL). ------------------------------------------------------------------------------------------------------------------ No results for input(s): TSH, T4TOTAL, T3FREE, THYROIDAB in the last 72 hours.  Invalid input(s): FREET3   Coagulation profile No results for input(s): INR, PROTIME in the last 168 hours. ------------------------------------------------------------------------------------------------------------------- No results for input(s): DDIMER in the last 72 hours. -------------------------------------------------------------------------------------------------------------------  Cardiac Enzymes No results for input(s): CKMB, TROPONINI, MYOGLOBIN in the last 168 hours.  Invalid input(s): CK ------------------------------------------------------------------------------------------------------------------ Invalid input(s): POCBNP  ---------------------------------------------------------------------------------------------------------------  Urinalysis    Component Value Date/Time   COLORURINE YELLOW (A) 12/01/2016 0201   APPEARANCEUR CLEAR (A) 12/01/2016 0201   APPEARANCEUR Clear 11/27/2014 1918   LABSPEC 1.018 12/01/2016 0201   LABSPEC 1.017 11/27/2014 1918   PHURINE 5.0 12/01/2016 0201   GLUCOSEU NEGATIVE 12/01/2016 0201   GLUCOSEU Negative 11/27/2014 1918   HGBUR NEGATIVE 12/01/2016 0201   BILIRUBINUR NEGATIVE 12/01/2016 0201   BILIRUBINUR Negative 11/27/2014 1918    KETONESUR NEGATIVE 12/01/2016 0201   PROTEINUR NEGATIVE 12/01/2016 0201   NITRITE NEGATIVE 12/01/2016 0201   LEUKOCYTESUR NEGATIVE 12/01/2016 0201   LEUKOCYTESUR Negative 11/27/2014 1918     RADIOLOGY: Dg Chest 1 View  Result Date: 11/30/2016 CLINICAL DATA:  Fever.  Shortness of breath.  Lymphoma in 2014. EXAM: CHEST 1 VIEW COMPARISON:  07/30/2016 FINDINGS: Right-sided Port-A-Cath terminates at the superior caval/ atrial junction. Midline trachea. Normal heart size and mediastinal contours. No pleural effusion or pneumothorax. Clear lungs. IMPRESSION: No acute cardiopulmonary disease. Electronically Signed   By: Abigail Miyamoto M.D.   On: 11/30/2016 22:49    EKG: Orders placed or performed during the hospital encounter of 11/30/16  . ED EKG 12-Lead  . ED EKG 12-Lead  . EKG 12-Lead  . EKG 12-Lead    IMPRESSION AND PLAN: 61 year old male patient with history of mantle cell lymphoma, DVT, COPD, Barrett's esophagus, GERD, migraine presented to the emergency room with fever, body aches. Admitting diagnosis 1. Sepsis 2. Mantle cell lymphoma 3. Hypotension 4. Skin rash Treatment plan Admit patient to medical floor Start patient on IV vancomycin and IV Zosyn antibiotics IV fluid hydration based on sepsis protocol Follow up cultures Continue antecolic patient with Lovenox Patient has history of DVT in the past Monitor blood pressure closely Supportive care   All the records are reviewed and case discussed with ED provider. Management plans discussed with the patient, family and they are in agreement.  CODE STATUS:DNR Surrogate Decision Maker : wife    Code Status Orders        Start     Ordered   12/01/16 [phone]  Do not attempt resuscitation (DNR)  Continuous    Question Answer Comment  In the event of cardiac or respiratory ARREST Do not call a "code blue"   In the event of cardiac or respiratory ARREST Do not perform Intubation, CPR, defibrillation or ACLS   In the event  of cardiac or respiratory ARREST Use medication by any route, position, wound care, and other measures to relive pain and suffering. May use oxygen, suction and manual treatment of airway obstruction as needed for comfort.      12/01/16 0351    Code Status History    Date Active Date Inactive Code Status Order ID Comments User Context   07/01/2016  8:11 PM 07/05/2016  9:44 PM DNR [id number]  Gladstone Lighter, MD Inpatient   10/02/2015 11:04 PM 10/04/2015  6:12 PM Full Code [id number]  Lance Coon, MD ED   09/25/2015  1:32 AM 09/26/2015  2:00 PM Full Code [id number]  Saundra Shelling, MD Inpatient   08/17/2015 10:03 PM 08/19/2015  2:23 PM Full Code [id number]  Gladstone Lighter, MD Inpatient   08/10/2015  8:28 AM 08/12/2015  1:40 PM Full Code [id number]  Saundra Shelling, MD Inpatient   07/17/2015  1:08 AM 07/19/2015  2:13 PM Full Code [id number]  Saundra Shelling, MD Inpatient   05/18/2015  1:29 PM 05/19/2015  3:02 PM Full Code [id number]  Dustin Flock, MD Inpatient  TOTAL TIME TAKING CARE OF THIS PATIENT: 55 minutes.    Saundra Shelling M.D on 12/01/2016 at 3:51 AM  Between 7am to 6pm - Pager - [phone]  After 6pm go to [email or web address] - password [email or web address] [facility]  Office  [phone]  CC: Primary care physician; Perrin Maltese, MD

## 2016-12-02 LAB — CBC WITH DIFFERENTIAL/PLATELET
Basophils Absolute: 0 10*3/uL (ref 0–0.1)
Basophils Relative: 0 %
Eosinophils Absolute: 0.1 10*3/uL (ref 0–0.7)
Eosinophils Relative: 2 %
HCT: 26.5 % — ABNORMAL LOW (ref 40.0–52.0)
Hemoglobin: 9 g/dL — ABNORMAL LOW (ref 13.0–18.0)
Lymphocytes Relative: 7 %
Lymphs Abs: 0.4 10*3/uL — ABNORMAL LOW (ref 1.0–3.6)
MCH: 29 pg (ref 26.0–34.0)
MCHC: 34 g/dL (ref 32.0–36.0)
MCV: 85.4 fL (ref 80.0–100.0)
Monocytes Absolute: 0.1 10*3/uL — ABNORMAL LOW (ref 0.2–1.0)
Monocytes Relative: 2 %
Neutro Abs: 4.8 10*3/uL (ref 1.4–6.5)
Neutrophils Relative %: 89 %
Platelets: 121 10*3/uL — ABNORMAL LOW (ref 150–440)
RBC: 3.11 MIL/uL — ABNORMAL LOW (ref 4.40–5.90)
RDW: 14.8 % — ABNORMAL HIGH (ref 11.5–14.5)
WBC: 5.5 10*3/uL (ref 3.8–10.6)

## 2016-12-02 LAB — URINE CULTURE: Culture: <10000 — AB

## 2016-12-02 LAB — GLUCOSE, CAPILLARY
GLUCOSE-CAPILLARY: 144 mg/dL — AB (ref 65–99)
GLUCOSE-CAPILLARY: 109 mg/dL — AB (ref 65–99)

## 2016-12-02 MED ORDER — INSULIN ASPART 100 UNIT/ML ~~LOC~~ SOLN: 0.0000 [IU] | Freq: Every day | SUBCUTANEOUS | Status: DC

## 2016-12-02 MED ORDER — KETOROLAC TROMETHAMINE 15 MG/ML IJ SOLN
15.0000 mg | Freq: Four times a day (QID) | INTRAMUSCULAR | Status: DC | PRN
  Administered 2016-12-02 – 2016-12-04 (×7): 15 mg via INTRAVENOUS
  Filled 2016-12-02 (×7): qty 1

## 2016-12-02 MED ORDER — SODIUM CHLORIDE 0.9 % IV SOLN
3.0000 g | Freq: Four times a day (QID) | INTRAVENOUS | Status: DC
  Administered 2016-12-02 – 2016-12-04 (×7): 3 g via INTRAVENOUS
  Filled 2016-12-02 (×11): qty 3

## 2016-12-02 MED ORDER — ENOXAPARIN SODIUM 100 MG/ML ~~LOC~~ SOLN
95.0000 mg | Freq: Two times a day (BID) | SUBCUTANEOUS | Status: DC
  Administered 2016-12-02 – 2016-12-04 (×4): 95 mg via SUBCUTANEOUS
  Filled 2016-12-02 (×4): qty 1

## 2016-12-02 MED ORDER — INSULIN ASPART 100 UNIT/ML ~~LOC~~ SOLN
0.0000 [IU] | Freq: Three times a day (TID) | SUBCUTANEOUS | Status: DC
  Administered 2016-12-02: 2 [IU] via SUBCUTANEOUS
  Administered 2016-12-03: 3 [IU] via SUBCUTANEOUS
  Filled 2016-12-02: qty 2
  Filled 2016-12-02: qty 3

## 2016-12-02 NOTE — Plan of Care (Signed)
Problem: Pain Managment: Goal: General experience of comfort will improve Outcome: Progressing Patient's pain being managed by PRN Dilaudid and Toradol, patient has no complaints of pain at this time. Reassessing pain levels every 2 hours and as needed. Will continue to assess and treat appropriately through discharge.

## 2016-12-02 NOTE — Progress Notes (Signed)
A&O. Very weak. Complained of pain all night, Dilaudid given every 2 hours with some relief. Dr. Ara Kussmaul notified of continuous pain.

## 2016-12-02 NOTE — Progress Notes (Signed)
[place] at [place] NAME: Alexander [name]    MR#:  [id number]  DATE OF BIRTH:  03/17/1956  SUBJECTIVE:  CHIEF COMPLAINT:   Chief Complaint  Patient presents with  . Generalized Body Aches  . Fever   Came with fever and sepsis, unknown origin so far. 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   Had c/o sore throat and swallowing difficulty on admission with whole body rash- now somewhat improved in all of this.  REVIEW OF SYSTEMS:  CONSTITUTIONAL: positive for fever, fatigue or weakness.  EYES: No blurred or double vision.  EARS, NOSE, AND THROAT: No tinnitus or ear pain.  RESPIRATORY: No cough, shortness of breath, wheezing or hemoptysis.  CARDIOVASCULAR: No chest pain, orthopnea, edema.  GASTROINTESTINAL: No nausea, vomiting, diarrhea or abdominal pain.  GENITOURINARY: No dysuria, hematuria.  ENDOCRINE: No polyuria, nocturia,  HEMATOLOGY: No anemia, easy bruising or bleeding SKIN: No rash or lesion. MUSCULOSKELETAL: No joint pain or arthritis.   NEUROLOGIC: No tingling, numbness, weakness.  PSYCHIATRY: No anxiety or depression.   ROS  DRUG ALLERGIES:   Allergies  Allergen Reactions  . Bee Venom Shortness Of Breath and Swelling  . Ativan [Lorazepam] Other (See Comments)    Reaction:  Dizziness     VITALS:  Blood pressure (!) 105/56, pulse 69, temperature 98.3 F (36.8 C), temperature source Oral, resp. rate 16, height 5\' 10"  (1.778 m), weight 95.2 kg (209 lb 12.8 oz), SpO2 100 %.  PHYSICAL EXAMINATION:  GENERAL:  61 y.o.-year-old patient lying in the bed with no acute distress.  EYES: Pupils equal, round, reactive to light and accommodation. No scleral icterus. Extraocular muscles intact.  HEENT: Head atraumatic, normocephalic. Oropharynx have whitish coating.  NECK:  Supple, no jugular venous distention. No thyroid enlargement, no tenderness.  LUNGS: Normal breath sounds bilaterally, no  wheezing, rales,rhonchi or crepitation. No use of accessory muscles of respiration.  CARDIOVASCULAR: S1, S2 normal. No murmurs, rubs, or gallops.  ABDOMEN: Soft, nontender, nondistended. Bowel sounds present. No organomegaly or mass.  EXTREMITIES: No pedal edema, cyanosis, or clubbing.  NEUROLOGIC: Cranial nerves II through XII are intact. Muscle strength 5/5 in all extremities. Sensation intact. Gait not checked.  PSYCHIATRIC: The patient is alert and oriented x 3.  SKIN: No obvious rash, lesion, or ulcer.   Physical Exam LABORATORY PANEL:   CBC  Recent Labs Lab 12/02/16 0633  WBC 5.5  HGB 9.0*  HCT 26.5*  PLT 121*   ------------------------------------------------------------------------------------------------------------------  Chemistries   Recent Labs Lab 11/30/16 2204 12/01/16 0707  NA 136 137  K 4.5 3.8  CL 102 107  CO2 26 23  GLUCOSE 201* 146*  BUN 13 12  CREATININE 0.94 0.97  CALCIUM 8.8* 7.3*  AST 22  --   ALT 19  --   ALKPHOS 87  --   BILITOT 0.7  --    ------------------------------------------------------------------------------------------------------------------  Cardiac Enzymes No results for input(s): TROPONINI in the last 168 hours. ------------------------------------------------------------------------------------------------------------------  RADIOLOGY:  Dg Chest 1 View  Result Date: 11/30/2016 CLINICAL DATA:  Fever.  Shortness of breath.  Lymphoma in 2014. EXAM: CHEST 1 VIEW COMPARISON:  07/30/2016 FINDINGS: Right-sided Port-A-Cath terminates at the superior caval/ atrial junction. Midline trachea. Normal heart size and mediastinal contours. No pleural effusion or pneumothorax. Clear lungs. IMPRESSION: No acute cardiopulmonary disease. Electronically Signed   By: Abigail Miyamoto M.D.   On: 11/30/2016 22:49    ASSESSMENT AND PLAN:  Active Problems:   Sepsis ([place])  * sepsis   IV vanc + zosyn   Cx sent- awaited source finding.    Negative UA, Xray chest.   Likely was due to pharyngitis.  * mental cell lymphoma   s/p recent chemo   Appreciated oncology consult.   As per her- generalized body ache and rash- May be secondary to chemo agent.  * throat pain   Some  White coating on oral pharynx- acute pharyngitis.   Check Strep A swab.   Cut down antibiotics to unasyn.  * dysphagia complain    Check slp eval.  * chronic pain- cancer releated   Cont pain meds, give IV dilaudid.   All the records are reviewed and case discussed with Care Management/Social Workerr. Management plans discussed with the patient, family and they are in agreement.  CODE STATUS: DNR  TOTAL TIME TAKING CARE OF THIS PATIENT: 35 minutes.    POSSIBLE D/C IN 1-2 DAYS, DEPENDING ON CLINICAL CONDITION.   Vaughan Basta M.D on 12/02/2016   Between 7am to 6pm - Pager - [phone]  After 6pm go to [email or web address] - password [email or web address] [facility]  Office  [phone]  CC: Primary care physician; Perrin Maltese, MD  Note: This dictation was prepared with Dragon dictation along with smaller phrase technology. Any transcriptional errors that result from this process are unintentional.

## 2016-12-02 NOTE — Progress Notes (Signed)
SLP Cancellation Note  Patient Details Name: KOLT MCWHIRTER MRN: [id number] DOB: [birth year]   Cancelled treatment:       Reason Eval/Treat Not Completed: SLP screened, no needs identified, will sign off (chart reviewed; NSG consulted. Met w/ pt.)  Pt denied any difficulty swallowing this morning and is currently on a regular diet; he ate 1/2 of his omelette and drank 2-3 juices/coffee. He denied overt s/s of aspiration; no decline in Pulmonary status was noted by NSG staff post breakfast meal. He has refused to swallow his Pills w/ NSG since yesterday but agreed to try swallowing them w/ APPLESAUCE this morning as it was noted he was swallowing adequately at the breakfast meal. He does NOT want to swallow the "large Potassium pill". Pt agreed w/ "cutting" meats d/t edentulous status - for easier, safer eating/swallowing. NSG updated on items discussed in the screen. Pt conversed at conversational level w/out deficits noted; pt denied any speech-language deficits.  Pt appears at his baseline for swallowing and communication at this time. ST services are available if indicated if any change in status.  Of note, pt c/o ongoing "pain" and was requesting more pain medication - NSG informed and will f/u w/ pt.    Orinda Kenner, MS, CCC-SLP Kemonte Ullman 12/02/2016, 9:25 AM

## 2016-12-03 LAB — CBC WITH DIFFERENTIAL/PLATELET
Basophils Absolute: 0 10*3/uL (ref 0–0.1)
Basophils Relative: 1 %
Eosinophils Absolute: 0.1 10*3/uL (ref 0–0.7)
Eosinophils Relative: 4 %
HCT: 26.8 % — ABNORMAL LOW (ref 40.0–52.0)
Hemoglobin: 8.9 g/dL — ABNORMAL LOW (ref 13.0–18.0)
Lymphocytes Relative: 13 %
Lymphs Abs: 0.4 10*3/uL — ABNORMAL LOW (ref 1.0–3.6)
MCH: 28 pg (ref 26.0–34.0)
MCHC: 33.1 g/dL (ref 32.0–36.0)
MCV: 84.6 fL (ref 80.0–100.0)
Monocytes Absolute: 0.1 10*3/uL — ABNORMAL LOW (ref 0.2–1.0)
Monocytes Relative: 3 %
Neutro Abs: 2.8 10*3/uL (ref 1.4–6.5)
Neutrophils Relative %: 79 %
Platelets: 155 10*3/uL (ref 150–440)
RBC: 3.17 MIL/uL — ABNORMAL LOW (ref 4.40–5.90)
RDW: 14.9 % — ABNORMAL HIGH (ref 11.5–14.5)
WBC: 3.5 10*3/uL — ABNORMAL LOW (ref 3.8–10.6)

## 2016-12-03 LAB — GLUCOSE, CAPILLARY
GLUCOSE-CAPILLARY: 106 mg/dL — AB (ref 65–99)
GLUCOSE-CAPILLARY: 90 mg/dL (ref 65–99)
Glucose-Capillary: 180 mg/dL — ABNORMAL HIGH (ref 65–99)
Glucose-Capillary: 137 mg/dL — ABNORMAL HIGH (ref 65–99)

## 2016-12-03 MED ORDER — SODIUM CHLORIDE 0.9% FLUSH: 10.0000 mL | INTRAVENOUS | Status: DC | PRN

## 2016-12-03 MED ORDER — SODIUM CHLORIDE 0.9% FLUSH
10.0000 mL | Freq: Two times a day (BID) | INTRAVENOUS | Status: DC
  Administered 2016-12-03 – 2016-12-04 (×2): 10 mL

## 2016-12-03 NOTE — Plan of Care (Signed)
Problem: Pain Managment: Goal: General experience of comfort will improve Outcome: Not Progressing Pt continues to stay in generalized all over body pain throughout this shift, asking for pain medication every time he can get it. This RN has alternated between Toradol & Dilaudid. These medications has only brought pt's pain level down to a 7 out of 10. Will continue to monitor.

## 2016-12-03 NOTE — Plan of Care (Signed)
Problem: Pain Managment: Goal: General experience of comfort will improve Outcome: Not Progressing Patient still experiencing acute generalized joint pain, rating 8-10/10. Medicating with PRN Dilaudid and Toradol. Will continue to reassess and manage pain appropriately through discharge.

## 2016-12-03 NOTE — Care Management (Signed)
Patient is agreeable to have home palliative follow up and or home health.  He is having issues with pain control said to be due to his chemo for mantle cell lymphoma.   He is followed by a palliative care physician physician Dr Mammie Russian at the [facility] in [place].  Has had "hospice"  but was discharged because "I wasn't dying fast enough."  This CM has set patient up with home health on previous occasion but he declined after discharge.  He is agreeable to accept home health / palliative in the home.   Agency preference is [facility].

## 2016-12-04 LAB — CBC WITH DIFFERENTIAL/PLATELET
Basophils Absolute: 0 10*3/uL (ref 0–0.1)
Basophils Relative: 1 %
Eosinophils Absolute: 0.1 10*3/uL (ref 0–0.7)
Eosinophils Relative: 4 %
HCT: 25.7 % — ABNORMAL LOW (ref 40.0–52.0)
Hemoglobin: 8.6 g/dL — ABNORMAL LOW (ref 13.0–18.0)
Lymphocytes Relative: 20 %
Lymphs Abs: 0.6 10*3/uL — ABNORMAL LOW (ref 1.0–3.6)
MCH: 27.9 pg (ref 26.0–34.0)
MCHC: 33.3 g/dL (ref 32.0–36.0)
MCV: 83.8 fL (ref 80.0–100.0)
Monocytes Absolute: 0.2 10*3/uL (ref 0.2–1.0)
Monocytes Relative: 7 %
Neutro Abs: 2.2 10*3/uL (ref 1.4–6.5)
Neutrophils Relative %: 68 %
Platelets: 163 10*3/uL (ref 150–440)
RBC: 3.06 MIL/uL — ABNORMAL LOW (ref 4.40–5.90)
RDW: 15.4 % — ABNORMAL HIGH (ref 11.5–14.5)
WBC: 3.1 10*3/uL — ABNORMAL LOW (ref 3.8–10.6)

## 2016-12-04 LAB — GLUCOSE, CAPILLARY
Glucose-Capillary: 107 mg/dL — ABNORMAL HIGH (ref 65–99)
Glucose-Capillary: 129 mg/dL — ABNORMAL HIGH (ref 65–99)

## 2016-12-04 LAB — CULTURE, GROUP A STREP (THRC)

## 2016-12-04 MED ORDER — AMOXICILLIN-POT CLAVULANATE 875-125 MG PO TABS: 1.0000 | ORAL_TABLET | Freq: Two times a day (BID) | ORAL | 0 refills | Status: AC

## 2016-12-04 NOTE — Evaluation (Signed)
Physical Therapy Evaluation Patient Details Name: Alexander [name] MRN: [id number] DOB: 1956-04-01 Today's Date: 12/04/2016   History of Present Illness  Pt is a 61 y.o.malewith a known history of Mantle cell lymphoma, GERD, erosive esophagitis, DVT, COPD, prostate hyperplasia, Barrett's esophagus, bronchial asthma presented to the emergency room with generalized body aches, fever for the last 3 days.Patient has generalized body pain and fatigue.No complaints of any cough.Had some chills but no dysuria.Patient currently on full dose Lovenox at home for anticoagulation.He was evaluated in the emergency room and lactate level was elevated.Patient was given IV fluids based on sepsis protocol and given broad-spectrum antibiotics that is IV vancomycin and IV Zosyn.Patient had fever of 103F.Patient had chemotherapy on the first week of this month. Code sepsis was called and hospitalist service was consulted for further care of the patient.Patient also has some macular rash over the chest wall abdominal area and an extremities.    Clinical Impression  Pt presents with mild deficits in strength, transfers, mobility, gait, and balance, and significant deficits in activity tolerance.  Pt was Mod I with bed mobility tasks but did require extra time and effort during sup to/from sit.  Pt's BP in sitting 129/71 with SpO2 98% on room air and HR 75 bpm.  Pt requested no further BP testing in absence of symptoms secondary to BP cuff causing significant pain during inflation.  Pt remained asymptomatic during remainder of session. Pt SBA with transfers with good effort and good initial stability upon standing.  Pt's max amb distance was 21' with SBA and RW with good stability.  Pt also able to amb 15' with SPC and CGA with fair stability.  Pt will benefit from HHPT services to address above deficits for decreased caregiver assistance upon discharge.        Follow Up Recommendations Home health PT     Equipment Recommendations  None recommended by PT    Recommendations for Other Services       Precautions / Restrictions Precautions Precautions: Fall Restrictions Weight Bearing Restrictions: No      Mobility  Bed Mobility Overal bed mobility: Modified Independent             General bed mobility comments: Pt able to perform all bed mobility tasks without assistance but required extra time/effort  Transfers Overall transfer level: Needs assistance Equipment used: Rolling walker (2 wheeled) Transfers: Sit to/from Stand Sit to Stand: Supervision         General transfer comment: Good effort with sit to/from stand and steady without LOB  Ambulation/Gait Ambulation/Gait assistance: Supervision Ambulation Distance (Feet): 50 Feet Assistive device: Rolling walker (2 wheeled) Gait Pattern/deviations: Decreased step length - right;Decreased step length - left;Trunk flexed   Gait velocity interpretation: Below normal speed for age/gender General Gait Details: Slow cadence with amb but steady without LOB  Stairs            Wheelchair Mobility    Modified Rankin (Stroke Patients Only)       Balance Overall balance assessment: Needs assistance Sitting-balance support: No upper extremity supported;Feet supported Sitting balance-Leahy Scale: Good     Standing balance support: Bilateral upper extremity supported Standing balance-Leahy Scale: Good                               Pertinent Vitals/Pain Pain Assessment: 0-10 Pain Score: 8  Pain Location: General body aches Pain Descriptors / Indicators: Aching Pain Intervention(s): Monitored during  session;Limited activity within patient's tolerance    Home Living Family/patient expects to be discharged to:: Private residence Living Arrangements: Spouse/significant other;Children Available Help at Discharge: Family;Available 24 hours/day Type of Home: House Home Access: Ramped entrance      Home Layout: Two level;Able to live on main level with bedroom/bathroom Home Equipment: Gilford Rile - 2 wheels;Walker - 4 wheels;Wheelchair - manual;Cane - single point      Prior Function Level of Independence: Independent with assistive device(s)         Comments: Pt reports ind amb at home with a SPC and has been using a w/c in the community; pt reports several falls in last year secondary to "passing out", pt reports he has seen an MD regarding this issue     Hand Dominance   Dominant Hand: Right    Extremity/Trunk Assessment   Upper Extremity Assessment Upper Extremity Assessment: Overall WFL for tasks assessed    Lower Extremity Assessment Lower Extremity Assessment: Generalized weakness       Communication   Communication: No difficulties  Cognition Arousal/Alertness: Awake/alert Behavior During Therapy: WFL for tasks assessed/performed Overall Cognitive Status: Within Functional Limits for tasks assessed                                        General Comments      Exercises Total Joint Exercises Ankle Circles/Pumps: AROM;Both;5 reps;10 reps Quad Sets: Strengthening;Both;5 reps;10 reps Gluteal Sets: Strengthening;Both;5 reps;10 reps Heel Slides: AROM;Both;5 reps Hip ABduction/ADduction: AROM;Both;5 reps Straight Leg Raises: AROM;Both;5 reps Long Arc Quad: AROM;Both;10 reps Knee Flexion: AROM;Both;10 reps Marching in Standing: AROM;Both;10 reps   Assessment/Plan    PT Assessment Patient needs continued PT services  PT Problem List Decreased strength;Decreased activity tolerance;Decreased balance;Decreased mobility       PT Treatment Interventions      PT Goals (Current goals can be found in the Care Plan section)  Acute Rehab PT Goals Patient Stated Goal: To get strong enough for community ambulation PT Goal Formulation: With patient Time For Goal Achievement: 12/17/16 Potential to Achieve Goals: Good    Frequency Min 2X/week    Barriers to discharge        Co-evaluation               End of Session Equipment Utilized During Treatment: Gait belt Activity Tolerance: Patient limited by fatigue Patient left: Other (comment) (Pt on commode with instructions to pull cord for nursing, nursing notified) Nurse Communication: Mobility status PT Visit Diagnosis: Muscle weakness (generalized) (M62.81);Difficulty in walking, not elsewhere classified (R26.2)    Time: 2244-9753 PT Time Calculation (min) (ACUTE ONLY): 27 min   Charges:   PT Evaluation $PT Eval Low Complexity: 1 Procedure PT Treatments $Therapeutic Exercise: 8-22 mins   PT G Codes:        DRoyetta Asal PT, DPT 12/04/16, 3:24 PM

## 2016-12-04 NOTE — Plan of Care (Signed)
Problem: Pain Managment: Goal: General experience of comfort will improve Outcome: Not Progressing Pt continues to have pain, treating with dilaudid and toradol, pain level only gets to about a 7. Will continue to monitor.

## 2016-12-04 NOTE — Care Management (Signed)
Patient confirms to CM that he is agreeable to home health RN and outpatient palliative services.  Agency preference is [employer].  Provided patient with Palliative brochure

## 2016-12-04 NOTE — Progress Notes (Signed)
[facility] [place] NAME: Alexander [name]    MR#:  [id number]  DATE OF BIRTH:  Jan 15, 1956  SUBJECTIVE:  CHIEF COMPLAINT:   Chief Complaint  Patient presents with  . Generalized Body Aches  . Fever   Came with fever and sepsis, unknown origin so far. 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   Had c/o sore throat and swallowing difficulty on admission with whole body rash- now somewhat improved in all of this.   He is saying- he may not want to continue chemo, and feels still very weak. Had some nausea last week and did not eat much.  REVIEW OF SYSTEMS:  CONSTITUTIONAL: positive for fever, fatigue or weakness.  EYES: No blurred or double vision.  EARS, NOSE, AND THROAT: No tinnitus or ear pain.  RESPIRATORY: No cough, shortness of breath, wheezing or hemoptysis.  CARDIOVASCULAR: No chest pain, orthopnea, edema.  GASTROINTESTINAL: No nausea, vomiting, diarrhea or abdominal pain.  GENITOURINARY: No dysuria, hematuria.  ENDOCRINE: No polyuria, nocturia,  HEMATOLOGY: No anemia, easy bruising or bleeding SKIN: No rash or lesion. MUSCULOSKELETAL: No joint pain or arthritis.   NEUROLOGIC: No tingling, numbness, weakness.  PSYCHIATRY: No anxiety or depression.   ROS  DRUG ALLERGIES:   Allergies  Allergen Reactions  . Bee Venom Shortness Of Breath and Swelling  . Ativan [Lorazepam] Other (See Comments)    Reaction:  Dizziness     VITALS:  Blood pressure 133/70, pulse 69, temperature 97.9 F (36.6 C), resp. rate 18, height 5\' 10"  (1.778 m), weight 95.2 kg (209 lb 12.8 oz), SpO2 97 %.  PHYSICAL EXAMINATION:  GENERAL:  61 y.o.-year-old patient lying in the bed with no acute distress.  EYES: Pupils equal, round, reactive to light and accommodation. No scleral icterus. Extraocular muscles intact.  HEENT: Head atraumatic, normocephalic. Oropharynx have whitish coating.  NECK:  Supple, no jugular venous  distention. No thyroid enlargement, no tenderness.  LUNGS: Normal breath sounds bilaterally, no wheezing, rales,rhonchi or crepitation. No use of accessory muscles of respiration.  CARDIOVASCULAR: S1, S2 normal. No murmurs, rubs, or gallops.  ABDOMEN: Soft, nontender, nondistended. Bowel sounds present. No organomegaly or mass.  EXTREMITIES: No pedal edema, cyanosis, or clubbing.  NEUROLOGIC: Cranial nerves II through XII are intact. Muscle strength 4/5 in all extremities. Sensation intact. Gait not checked.  PSYCHIATRIC: The patient is alert and oriented x 3.  SKIN: No obvious rash, lesion, or ulcer.   Physical Exam LABORATORY PANEL:   CBC  Recent Labs Lab 12/04/16 0451  WBC 3.1*  HGB 8.6*  HCT 25.7*  PLT 163   ------------------------------------------------------------------------------------------------------------------  Chemistries   Recent Labs Lab 11/30/16 2204 12/01/16 0707  NA 136 137  K 4.5 3.8  CL 102 107  CO2 26 23  GLUCOSE 201* 146*  BUN 13 12  CREATININE 0.94 0.97  CALCIUM 8.8* 7.3*  AST 22  --   ALT 19  --   ALKPHOS 87  --   BILITOT 0.7  --    ------------------------------------------------------------------------------------------------------------------  Cardiac Enzymes No results for input(s): TROPONINI in the last 168 hours. ------------------------------------------------------------------------------------------------------------------  RADIOLOGY:  No results found.  ASSESSMENT AND PLAN:   Active Problems:   Sepsis ([place])  * sepsis   IV vanc + zosyn   Cx sent- awaited source finding.   Negative UA, Xray chest.   Likely was due to pharyngitis.   bl and urine cx are negative so far.  *  mental cell lymphoma   s/p recent chemo   Appreciated oncology consult.   As per her- generalized body ache and rash- May be secondary to chemo agent.   Pt is thinking, he may not continue chemo.  * throat pain   Some  White coating on oral  pharynx- acute pharyngitis.   Check Strep A swab.   Cut down antibiotics to unasyn.  * dysphagia complain    Check slp eval.  * chronic pain- cancer releated   Cont pain meds, give IV dilaudid.   All the records are reviewed and case discussed with Alexander [name]/Alexander [name]. [name] plans discussed with the patient, family and they are in agreement.  CODE STATUS: DNR  TOTAL TIME TAKING Alexander OF THIS PATIENT: 35 minutes.  PT an palliative Alexander eval.  POSSIBLE D/C IN 1-2 DAYS, DEPENDING ON CLINICAL CONDITION.   Alexander [name] M.D on 12/04/2016   Between 7am to 6pm - Pager - [phone]  After 6pm go to [email or web address] - password [email or web address] [facility]  Office  [phone]  CC: Primary Alexander physician; Alexander Maltese, MD  Note: This dictation was prepared with Dragon dictation along with smaller phrase technology. Any transcriptional errors that result from this process are unintentional.

## 2016-12-04 NOTE — Progress Notes (Signed)
New referral for [facility] for Skilled nursing and [place] received from [place]. Alexander [name] is a 61 year old with recurrent mantle cell lymphoma who was admitted with fever on day 13 s/p cycle #1 bendamustine with Rituxan. He has been treated with IV antibiotics for sepsis of unknown origin. He lives at home with his wife and oldest son. Writer met with Alexander [name] in his room to discuss [facility] services. He remains agreeable. Address and contact phone numbers confirmed. [facility] information and contact number left with patient.Per discussion with attending physician patient may discharge today. No DME needs at this time. Patient reports he has oxygen in place at home, cane at bedside. Patient information faxed to referral. Will need F2F and home health orders placed. Attending aware.  Flo Shanks RN, BSN, [facility] liaison [phone] c

## 2016-12-04 NOTE — Discharge Summary (Addendum)
[facility] [place] NAME: Alexander [name]    MR#:  [id number]  DATE OF BIRTH:  1956/05/03  DATE OF ADMISSION:  11/30/2016 ADMITTING PHYSICIAN: Saundra Shelling, MD  DATE OF DISCHARGE: 12/04/2016  PRIMARY CARE PHYSICIAN: Perrin Maltese, MD    ADMISSION DIAGNOSIS:  Myalgia [M79.1] Rash [R21] Portacath in place [Z95.828] Central line infiltration, initial encounter (Alexander [name]) [T82.538A] Sepsis, due to unspecified organism (Alexander [name]) [A41.9] Fever, unspecified fever cause [R50.9]  DISCHARGE DIAGNOSIS:  Active Problems:   Sepsis (Alexander [name])    SECONDARY DIAGNOSIS:   Past Medical History:  Diagnosis Date  . Asthma   . Barrett's esophagus with high grade dysplasia   . BPH (benign prostatic hyperplasia)   . COPD (chronic obstructive pulmonary disease) (Alexander [name])   . Deviated nasal septum   . DVT (deep venous thrombosis) (Alexander [name])   . Erosive esophagitis   . GERD (gastroesophageal reflux disease)   . History of ischemic colitis   . Mantle cell lymphoma (Alexander [name])   . Migraine     HOSPITAL COURSE:  * sepsis   IV vanc + zosyn   Cx sent- awaited source finding.   Negative UA, Xray chest.   Likely was due to pharyngitis.   bl and urine cx are negative so far.  * mental cell lymphoma   s/p recent chemo   Appreciated oncology consult.   As per her- generalized body ache and rash- May be secondary to chemo agent.   Pt is thinking, he may not continue chemo.  * throat pain   Some  White coating on oral pharynx- acute pharyngitis.   Check Strep A swab.   Cut down antibiotics to unasyn.  * dysphagia complain    Check slp eval.  * chronic pain- cancer releated   Cont pain meds, give IV dilaudid.  DISCHARGE CONDITIONS:   Stable.  CONSULTS OBTAINED:    DRUG ALLERGIES:   Allergies  Allergen Reactions  . Bee Venom Shortness Of Breath and Swelling  . Ativan [Lorazepam] Other (See Comments)    Reaction:  Dizziness     DISCHARGE MEDICATIONS:    Current Discharge Medication List    START taking these medications   Details  amoxicillin-clavulanate (AUGMENTIN) 875-125 MG tablet Take 1 tablet by mouth 2 (two) times daily. Qty: 6 tablet, Refills: 0      CONTINUE these medications which have NOT CHANGED   Details  acetaminophen (TYLENOL) 500 MG tablet Take 1,000 mg by mouth every 8 (eight) hours as needed.    albuterol (PROVENTIL HFA) 108 (90 Base) MCG/ACT inhaler Inhale 2 puffs into the lungs every 4 (four) hours as needed for wheezing or shortness of breath. Qty: 1 Inhaler, Refills: 0    budesonide-formoterol (SYMBICORT) 80-4.5 MCG/ACT inhaler Inhale 2 puffs into the lungs 2 (two) times daily.    docusate sodium (COLACE) 100 MG capsule Take 100 mg by mouth 2 (two) times daily.    enoxaparin (LOVENOX) 100 MG/ML injection Inject 100 mg into the skin every 12 (twelve) hours.    fluticasone (FLONASE) 50 MCG/ACT nasal spray Place 2 sprays into both nostrils daily. Qty: 16 g, Refills: 0    guaiFENesin (ROBITUSSIN) 100 MG/5ML SOLN Take 5 mLs (100 mg total) by mouth every 4 (four) hours as needed for cough or to loosen phlegm. Qty: 120 mL, Refills: 0    HYDROmorphone (DILAUDID) 4 MG tablet Take by mouth every 4 (four) hours as needed for severe pain.    ipratropium-albuterol (DUONEB)  0.5-2.5 (3) MG/3ML SOLN Take 3 mLs by nebulization every 4 (four) hours as needed. Qty: 360 mL, Refills: 6    loperamide (IMODIUM) 2 MG capsule Take 2 mg by mouth as needed for diarrhea or loose stools.    methadone (DOLOPHINE) 5 MG tablet Take 10 mg by mouth 2 (two) times daily as needed.    mirtazapine (REMERON) 30 MG tablet Take 30 mg by mouth at bedtime.    polyethylene glycol (MIRALAX / GLYCOLAX) packet Take 17 g by mouth daily as needed for mild constipation. Qty: 14 each, Refills: 0    potassium chloride SA (K-DUR,KLOR-CON) 20 MEQ tablet Take 20 mEq by mouth daily.     promethazine (PHENERGAN) 25 MG tablet Take 1 tablet (25 mg total)  by mouth every 6 (six) hours as needed for nausea or vomiting. Qty: 12 tablet, Refills: 1    senna-docusate (SENOKOT-S) 8.6-50 MG tablet Take 1 tablet by mouth 2 (two) times daily.    sodium chloride (OCEAN) 0.65 % SOLN nasal spray Place 2 sprays into both nostrils as needed for congestion. Qty: 1 Bottle, Refills: 0    tamsulosin (FLOMAX) 0.4 MG CAPS capsule Take 0.4 mg by mouth daily.    feeding supplement (BOOST / RESOURCE BREEZE) LIQD Take 1 Container by mouth 3 (three) times daily between meals. Qty: 90 Container, Refills: 6    fluconazole (DIFLUCAN) 100 MG tablet Take 100 mg by mouth daily.    omeprazole (PRILOSEC) 40 MG capsule Take 40 mg by mouth daily.    potassium chloride (K-DUR) 10 MEQ tablet Take 1 tablet (10 mEq total) by mouth 2 (two) times daily. Qty: 6 tablet, Refills: 0      STOP taking these medications     azithromycin (ZITHROMAX) 250 MG tablet      diazepam (VALIUM) 5 MG tablet      doxycycline (VIBRAMYCIN) 100 MG capsule      predniSONE (DELTASONE) 20 MG tablet          DISCHARGE INSTRUCTIONS:    Follow with PMD in 1-2 weeks.  palliative care nurse to follow at home  If you experience worsening of your admission symptoms, develop shortness of breath, life threatening emergency, suicidal or homicidal thoughts you must seek medical attention immediately by calling 911 or calling your MD immediately  if symptoms less severe.  You Must read complete instructions/literature along with all the possible adverse reactions/side effects for all the Medicines you take and that have been prescribed to you. Take any new Medicines after you have completely understood and accept all the possible adverse reactions/side effects.   Please note  You were cared for by a hospitalist during your hospital stay. If you have any questions about your discharge medications or the care you received while you were in the hospital after you are discharged, you can call the unit  and asked to speak with the hospitalist on call if the hospitalist that took care of you is not available. Once you are discharged, your primary care physician will handle any further medical issues. Please note that NO REFILLS for any discharge medications will be authorized once you are discharged, as it is imperative that you return to your primary care physician (or establish a relationship with a primary care physician if you do not have one) for your aftercare needs so that they can reassess your need for medications and monitor your lab values.    Today   CHIEF COMPLAINT:   Chief Complaint  Patient  presents with  . Generalized Body Aches  . Fever    HISTORY OF PRESENT ILLNESS:  Alexander [name]  is a 61 y.o. male with a known history of Mantle cell lymphoma, GERD, erosive esophagitis, DVT, COPD, prostate hyperplasia, Barrett's esophagus, bronchial asthma presented to the emergency room with generalized body aches, fever for the last 3 days. Patient has generalized body pain and fatigue. No complaints of any cough. Had some chills but no dysuria. Patient currently on full dose Lovenox at home for anticoagulation. He was evaluated in the emergency room and lactate level was elevated. Patient was given IV fluids based on sepsis protocol and given broad-spectrum antibiotics that is IV vancomycin and IV Zosyn. Patient had fever of 103F. Patient had chemotherapy on the first week of this month. Code sepsis was called and hospitalist service was consulted for further care of the patient. Patient also has some macular rash over the chest wall abdominal area and an extremities for the last 2 days.   VITAL SIGNS:  Blood pressure (!) 118/59, pulse 66, temperature 97.8 F (36.6 C), temperature source Oral, resp. rate 18, height 5\' 10"  (1.778 m), weight 95.2 kg (209 lb 12.8 oz), SpO2 98 %.  I/O:   Intake/Output Summary (Last 24 hours) at 12/04/16 1341 Last data filed at 12/04/16 1017  Gross per 24  hour  Intake              803 ml  Output              975 ml  Net             -172 ml    PHYSICAL EXAMINATION:   GENERAL:  61 y.o.-year-old patient lying in the bed with no acute distress.  EYES: Pupils equal, round, reactive to light and accommodation. No scleral icterus. Extraocular muscles intact.  HEENT: Head atraumatic, normocephalic. Oropharynx have whitish coating.  NECK:  Supple, no jugular venous distention. No thyroid enlargement, no tenderness.  LUNGS: Normal breath sounds bilaterally, no wheezing, rales,rhonchi or crepitation. No use of accessory muscles of respiration.  CARDIOVASCULAR: S1, S2 normal. No murmurs, rubs, or gallops.  ABDOMEN: Soft, nontender, nondistended. Bowel sounds present. No organomegaly or mass.  EXTREMITIES: No pedal edema, cyanosis, or clubbing.  NEUROLOGIC: Cranial nerves II through XII are intact. Muscle strength 4/5 in all extremities. Sensation intact. Gait not checked.  PSYCHIATRIC: The patient is alert and oriented x 3.  SKIN: No obvious rash, lesion, or ulcer.   DATA REVIEW:   CBC  Recent Labs Lab 12/04/16 0451  WBC 3.1*  HGB 8.6*  HCT 25.7*  PLT 163    Chemistries   Recent Labs Lab 11/30/16 2204 12/01/16 0707  NA 136 137  K 4.5 3.8  CL 102 107  CO2 26 23  GLUCOSE 201* 146*  BUN 13 12  CREATININE 0.94 0.97  CALCIUM 8.8* 7.3*  AST 22  --   ALT 19  --   ALKPHOS 87  --   BILITOT 0.7  --     Cardiac Enzymes No results for input(s): TROPONINI in the last 168 hours.  Microbiology Results  Results for orders placed or performed during the hospital encounter of 11/30/16  Blood Culture (routine x 2)     Status: None (Preliminary result)   Collection Time: 11/30/16 10:04 PM  Result Value Ref Range Status   Specimen Description BLOOD LEFT ANTECUBITAL  Final   Special Requests   Final    BOTTLES DRAWN AEROBIC AND  ANAEROBIC Blood Culture results may not be optimal due to an excessive volume of blood received in culture  bottles   Culture NO GROWTH 4 DAYS  Final   Report Status PENDING  Incomplete  Blood Culture (routine x 2)     Status: None (Preliminary result)   Collection Time: 11/30/16 10:04 PM  Result Value Ref Range Status   Specimen Description BLOOD LEFT ANTECUBITAL  Final   Special Requests   Final    BOTTLES DRAWN AEROBIC AND ANAEROBIC Blood Culture adequate volume 2ND SET IS LABELED AS LEFT A.C. DRAWN AT 2204, EXACTLY SAME TIME AND LOCATION AS 1ST SET   Culture NO GROWTH 4 DAYS  Final   Report Status PENDING  Incomplete  Urine culture     Status: Abnormal   Collection Time: 12/01/16  2:01 AM  Result Value Ref Range Status   Specimen Description URINE, RANDOM  Final   Special Requests NONE  Final   Culture (A)  Final    <10,000 COLONIES/mL INSIGNIFICANT GROWTH Performed at [facility], [address] [place], [place] [address]    Report Status 12/02/2016 FINAL  Final  Culture, group A strep     Status: None   Collection Time: 12/01/16  3:45 PM  Result Value Ref Range Status   Specimen Description THROAT  Final   Special Requests Immunocompromised  Final   Culture   Final    NO GROUP A STREP (S.PYOGENES) ISOLATED Performed at [facility], [place], [place], [place] [address]    Report Status 12/04/2016 FINAL  Final    RADIOLOGY:  No results found.  EKG:   Orders placed or performed during the hospital encounter of 11/30/16  . ED EKG 12-Lead  . ED EKG 12-Lead  . EKG 12-Lead  . EKG 12-Lead  . EKG      Management plans discussed with the patient, family and they are in agreement.  CODE STATUS:     Code Status Orders        Start     Ordered   12/01/16 0352  Do not attempt resuscitation (DNR)  Continuous    Question Answer Comment  In the event of cardiac or respiratory ARREST Do not call a "code blue"   In the event of cardiac or respiratory ARREST Do not perform Intubation, CPR, defibrillation or ACLS   In the event of cardiac or respiratory  ARREST Use medication by any route, position, wound care, and other measures to relive pain and suffering. May use oxygen, suction and manual treatment of airway obstruction as needed for comfort.      12/01/16 0351    Code Status History    Date Active Date Inactive Code Status Order ID Comments User Context   07/01/2016  8:11 PM 07/05/2016  9:44 PM DNR [id number]  Gladstone Lighter, MD Inpatient   10/02/2015 11:04 PM 10/04/2015  6:12 PM Full Code [id number]  Lance Coon, MD ED   09/25/2015  1:32 AM 09/26/2015  2:00 PM Full Code [id number]  Saundra Shelling, MD Inpatient   08/17/2015 10:03 PM 08/19/2015  2:23 PM Full Code [id number]  Gladstone Lighter, MD Inpatient   08/10/2015  8:28 AM 08/12/2015  1:40 PM Full Code [id number]  Saundra Shelling, MD Inpatient   07/17/2015  1:08 AM 07/19/2015  2:13 PM Full Code [id number]  Saundra Shelling, MD Inpatient   05/18/2015  1:29 PM 05/19/2015  3:02 PM Full Code [id number]  Dustin Flock, MD Inpatient  Advance Directive Documentation     Most Recent Value  Type of Advance Directive  Healthcare Power of Attorney  Pre-existing out of facility DNR order (yellow form or pink MOST form)  -  "MOST" Form in Place?  -      TOTAL TIME TAKING CARE OF THIS PATIENT: 35 minutes.    Vaughan Basta M.D on 12/04/2016 at 1:41 PM  Between 7am to 6pm - Pager - [phone]  After 6pm go to [email or web address] - password [email or web address] [facility]  Office  [phone]  CC: Primary care physician; Perrin Maltese, MD   Note: This dictation was prepared with Dragon dictation along with smaller phrase technology. Any transcriptional errors that result from this process are unintentional.

## 2016-12-04 NOTE — Care Management (Signed)
Notified [facility] of discharge and home health orders.  Reminded attending of need to add palliative care consuilt referral in the discharge summary

## 2016-12-04 NOTE — Discharge Instructions (Signed)
Palliative care nurse to follow at home. °

## 2016-12-05 LAB — CULTURE, BLOOD (ROUTINE X 2)
CULTURE: NO GROWTH
Culture: NO GROWTH

## 2016-12-27 IMAGING — CT CT ABD-PELV W/ CM
2 of 5 series · 15 of 46 positions shown, 17 images · IV contrast (APPLIED)
Comparison: 09/24/2015 and 02/08/2014

CLINICAL DATA: Nausea and vomiting beginning this morning. Diarrhea
beginning last night which shortness-of-breath. History of lymphoma
diagnosed 1708.

EXAM:
CT ABDOMEN AND PELVIS WITH CONTRAST
TECHNIQUE: Multidetector CT imaging of the abdomen and pelvis was performed
using the standard protocol following bolus administration of
intravenous contrast.
CONTRAST:  100mL L55QTP-QPP IOPAMIDOL (L55QTP-QPP) INJECTION 61%

[Series 2: axial st · axial · 0.77mm/px · z∈[-1194,-754]mm · 12 of 98 slices shown, 14 images]
[im 5/98  soft-tissue]
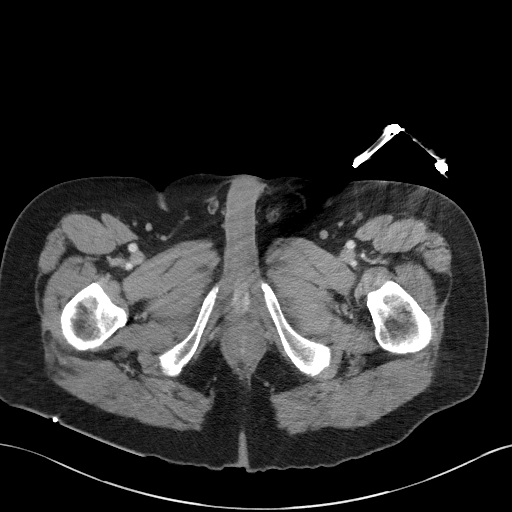
[im 5/98  bone]
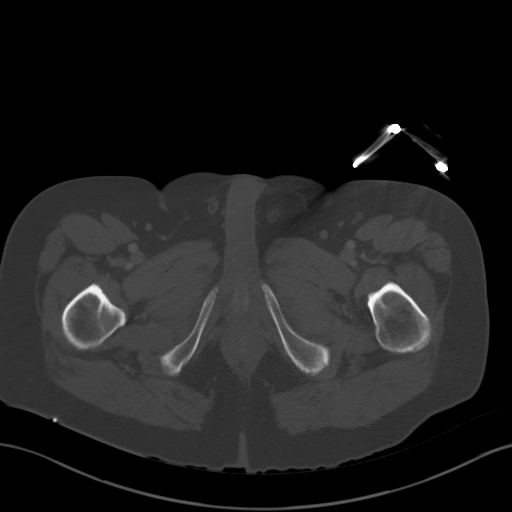
[im 15/98  soft-tissue]
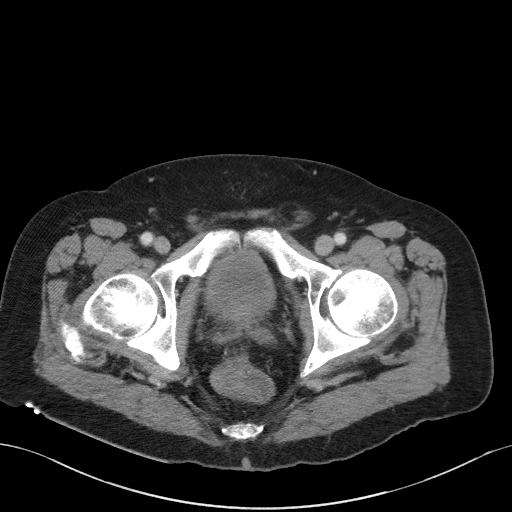
[im 20/98  soft-tissue]
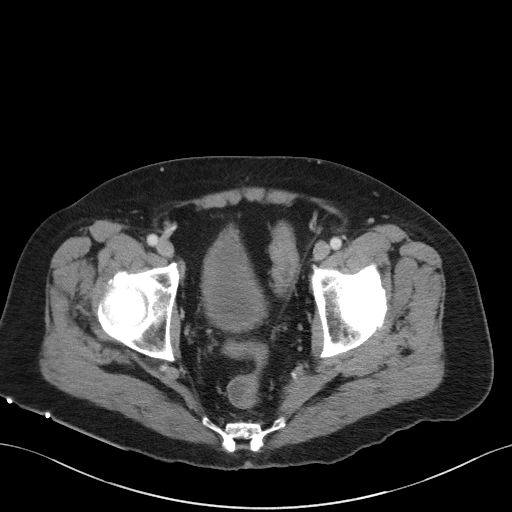
[im 30/98  soft-tissue]
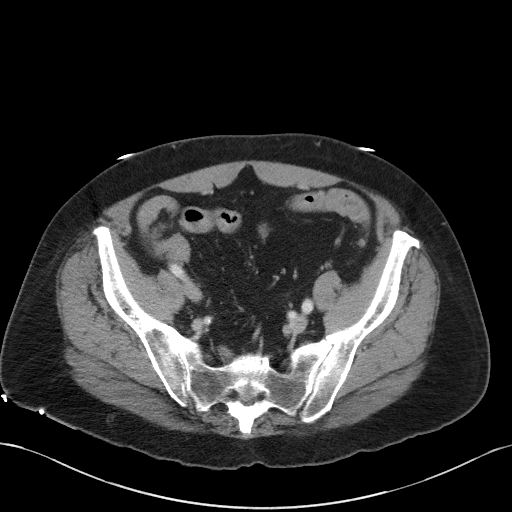
[im 39/98  soft-tissue]
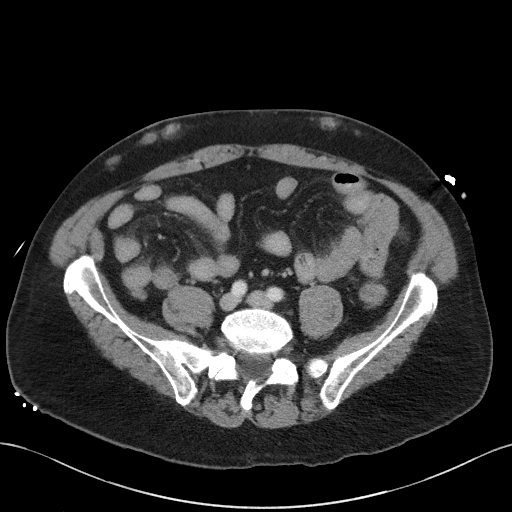
[im 44/98  soft-tissue]
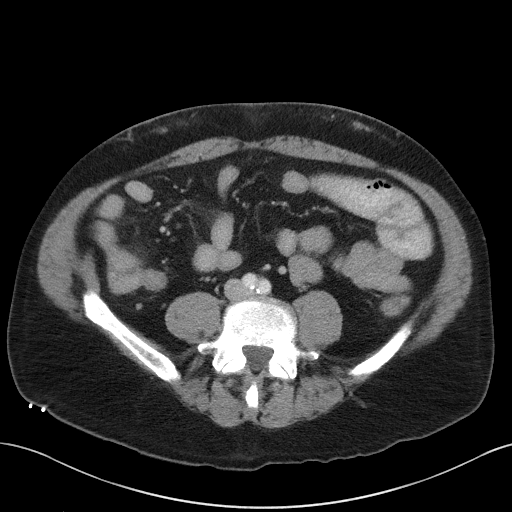
[im 54/98  soft-tissue]
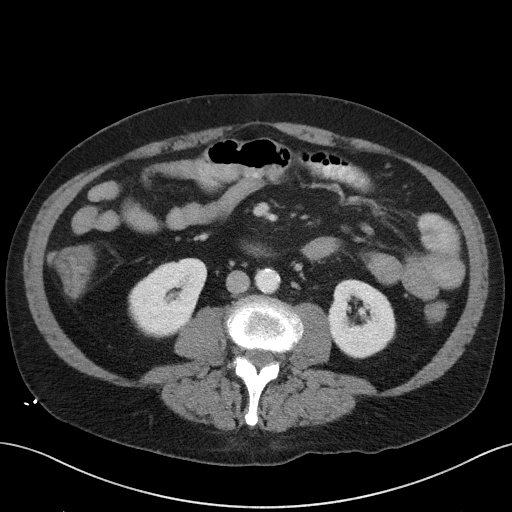
[im 59/98  soft-tissue]
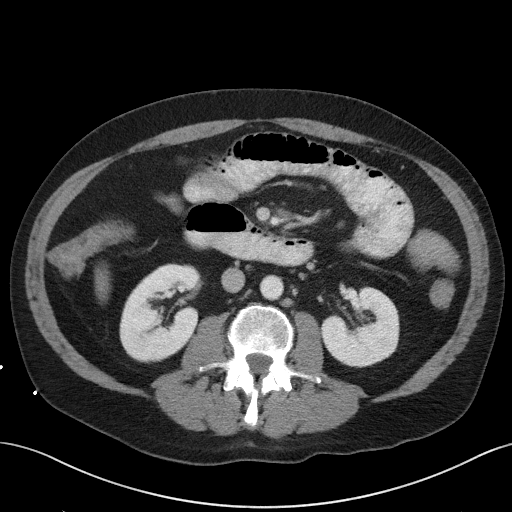
[im 68/98  soft-tissue]
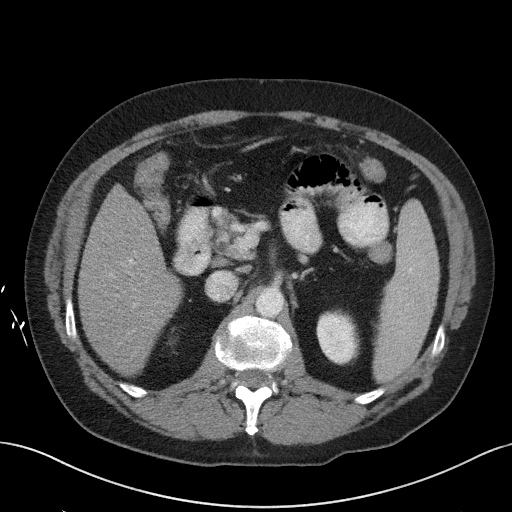
[im 68/98  bone]
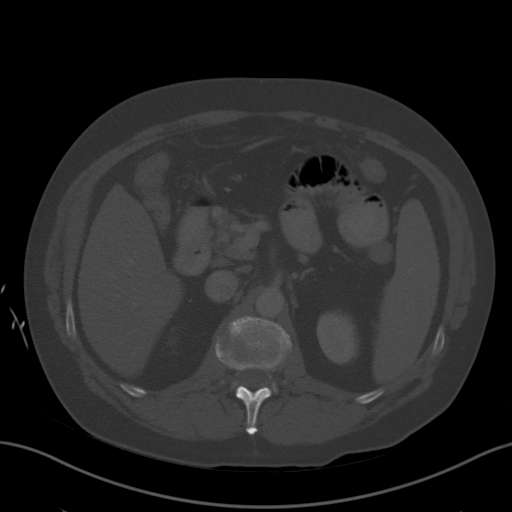
[im 78/98  soft-tissue]
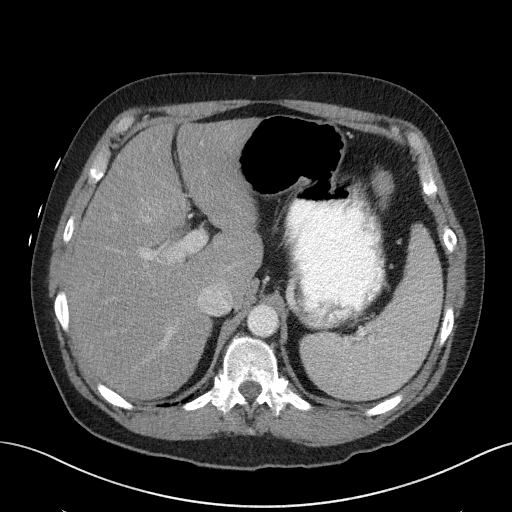
[im 83/98  soft-tissue]
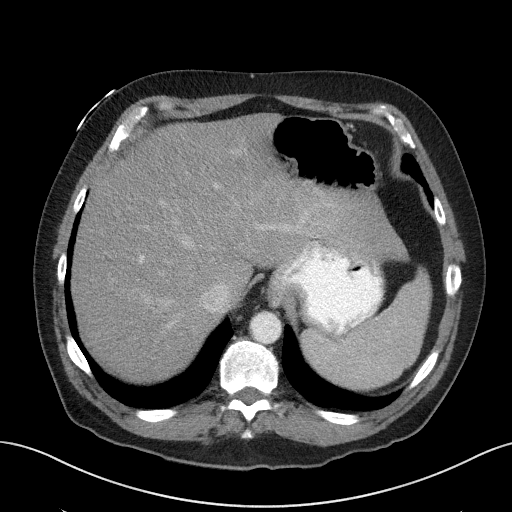
[im 93/98  soft-tissue]
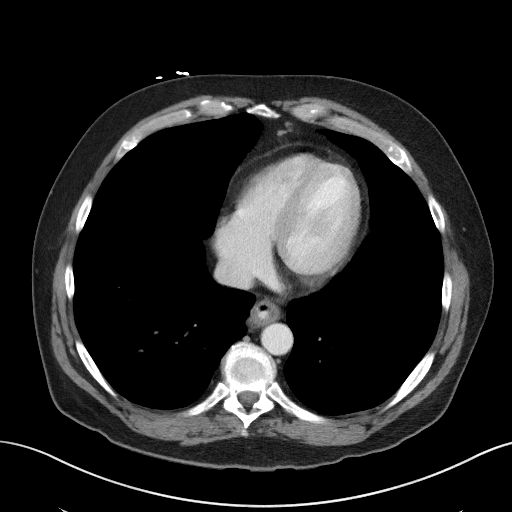

[Series 6: coronal st · coronal · 0.73mm/px · 3 of 94 slices shown]
[im 32/94  soft-tissue]
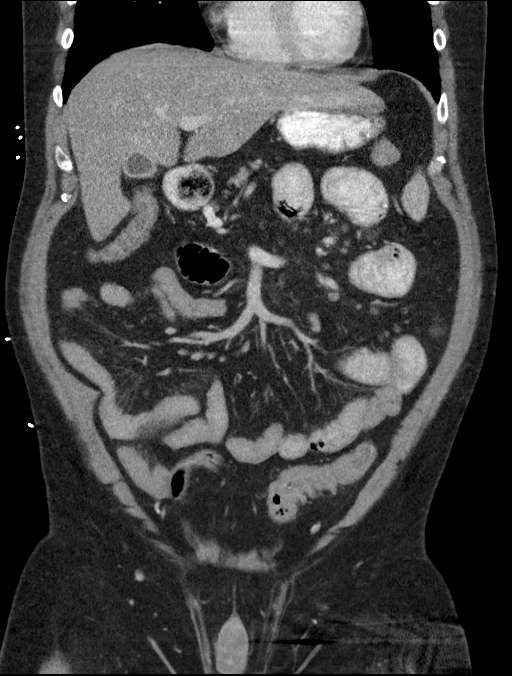
[im 42/94  soft-tissue]
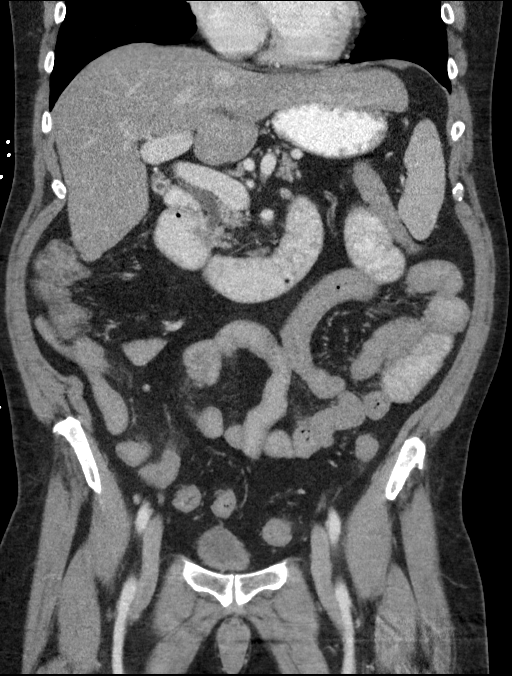
[im 52/94  soft-tissue]
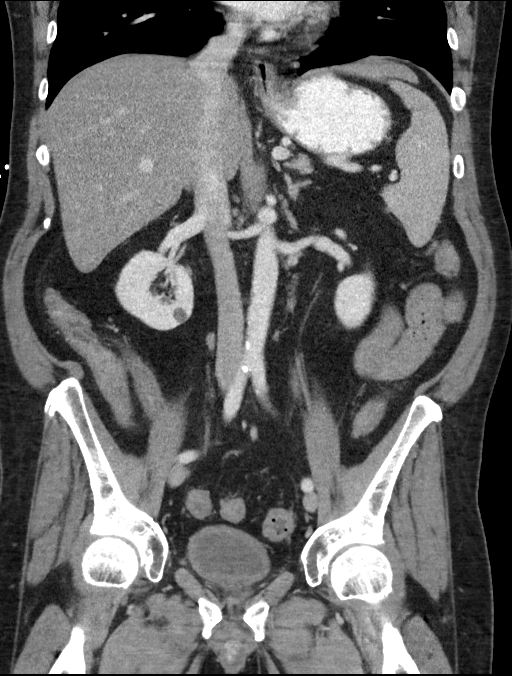

[15 of 46 positions shown; findings below may reference images not displayed]

FINDINGS: Lower chest: Patchy airspace process over the right lower lobe
likely pneumonia. No effusion. Subtle air contrast/fluid level over
the distal esophagus which may be due to dysmotility or mild reflux.

Hepatobiliary: No masses or other significant abnormality.

Pancreas: No mass, inflammatory changes, or other significant
abnormality.

Spleen: Mild splenomegaly slightly worse.

Adrenals/Urinary Tract: Stable 1.6 cm left adrenal nodule unchanged.
Kidneys are normal size without hydronephrosis or nephrolithiasis.
Sub cm hypodensity over the lower pole right renal cortex unchanged
and likely a cyst. Ureters and bladder are within normal.

Stomach/Bowel: Stomach is within normal. There is mild dilatation of
the proximal jejunum measuring up to 4 cm in diameter. There is a
gradual transition to normal caliber jejunum without focal
transition point. Appendix is normal. Subtle diverticulosis of the
colon. The colon is predominately decompressed. No evidence of
inflammatory process, or abnormal fluid collections.

Vascular/Lymphatic: No pathologically enlarged lymph nodes. No
evidence of abdominal aortic aneurysm. Minimal calcified plaque over
the abdominal aorta.

Reproductive: No mass or other significant abnormality.

Other: Few small subcutaneous nodules bilaterally over the anterior
abdominal wall just below level of the umbilicus which are new.
Small umbilical hernia containing only peritoneal fat.

Musculoskeletal: Mild degenerate change of the spine and hips.
Minimal degenerate change of the sacroiliac joints.
IMPRESSION: Patchy airspace process over the right lower lobe likely pneumonia.

Mild dilatation of the proximal jejunum measuring up to 4 cm in
diameter with gradual transition to normal caliber without focal
transition point. This may represent a partial/early obstructive
process versus focal ileus.

Mild splenomegaly, slightly worse.

Stable 1.6 cm left adrenal nodule likely an adenoma.

Sub cm right renal cortical hypodensity unchanged likely a cyst but
too small to characterize.

Nonspecific soft tissue not over the bilateral anterior abdominal
wall just below the level of the umbilicus which are new. Findings
may be due to injection sites versus metastatic subcutaneous disease
as recommend clinical correlation.

## 2017-03-04 ENCOUNTER — Encounter: Payer: Self-pay | Admitting: Emergency Medicine

## 2017-03-04 ENCOUNTER — Emergency Department
Admission: EM | Admit: 2017-03-04 | Discharge: 2017-03-04 | Disposition: A | Discharge status: Other Acute Inpatient Hospital | Payer: Medicaid Other | Attending: Emergency Medicine | Admitting: Emergency Medicine

## 2017-03-04 ENCOUNTER — Emergency Department: Payer: Medicaid Other

## 2017-03-04 DIAGNOSIS — C8313 Mantle cell lymphoma, intra-abdominal lymph nodes: Secondary | ICD-10-CM | POA: Diagnosis not present

## 2017-03-04 DIAGNOSIS — Z79899 Other long term (current) drug therapy: Secondary | ICD-10-CM | POA: Diagnosis not present

## 2017-03-04 DIAGNOSIS — Z87891 Personal history of nicotine dependence: Secondary | ICD-10-CM | POA: Diagnosis not present

## 2017-03-04 DIAGNOSIS — R509 Fever, unspecified: Secondary | ICD-10-CM | POA: Diagnosis not present

## 2017-03-04 DIAGNOSIS — A419 Sepsis, unspecified organism: Secondary | ICD-10-CM | POA: Diagnosis not present

## 2017-03-04 DIAGNOSIS — J45909 Unspecified asthma, uncomplicated: Secondary | ICD-10-CM | POA: Insufficient documentation

## 2017-03-04 DIAGNOSIS — J449 Chronic obstructive pulmonary disease, unspecified: Secondary | ICD-10-CM | POA: Diagnosis not present

## 2017-03-04 DIAGNOSIS — J029 Acute pharyngitis, unspecified: Secondary | ICD-10-CM | POA: Insufficient documentation

## 2017-03-04 LAB — LACTIC ACID, PLASMA
Lactic Acid, Venous: 2.4 mmol/L (ref 0.5–1.9)
Lactic Acid, Venous: 1.7 mmol/L (ref 0.5–1.9)

## 2017-03-04 LAB — CBC WITH DIFFERENTIAL/PLATELET
BASOS ABS: 0 10*3/uL (ref 0–0.1)
BASOS PCT: 1 %
Eosinophils Absolute: 0.1 10*3/uL (ref 0–0.7)
Eosinophils Relative: 1 %
HEMATOCRIT: 34.1 % — AB (ref 40.0–52.0)
Hemoglobin: 11.8 g/dL — ABNORMAL LOW (ref 13.0–18.0)
Lymphocytes Relative: 32 %
Lymphs Abs: 2.5 10*3/uL (ref 1.0–3.6)
MCH: 29.2 pg (ref 26.0–34.0)
MCHC: 34.5 g/dL (ref 32.0–36.0)
MCV: 84.4 fL (ref 80.0–100.0)
MONO ABS: 0.7 10*3/uL (ref 0.2–1.0)
Monocytes Relative: 10 %
NEUTROS ABS: 4.4 10*3/uL (ref 1.4–6.5)
NEUTROS PCT: 56 %
Platelets: 144 10*3/uL — ABNORMAL LOW (ref 150–440)
RBC: 4.04 MIL/uL — ABNORMAL LOW (ref 4.40–5.90)
RDW: 14.7 % — AB (ref 11.5–14.5)
WBC: 7.8 10*3/uL (ref 3.8–10.6)

## 2017-03-04 LAB — URINALYSIS, ROUTINE W REFLEX MICROSCOPIC
Bilirubin Urine: NEGATIVE
Glucose, UA: NEGATIVE mg/dL
Hgb urine dipstick: NEGATIVE
Ketones, ur: NEGATIVE mg/dL
LEUKOCYTES UA: NEGATIVE
NITRITE: NEGATIVE
PH: 5 (ref 5.0–8.0)
Protein, ur: NEGATIVE mg/dL
SPECIFIC GRAVITY, URINE: 1.017 (ref 1.005–1.030)

## 2017-03-04 LAB — COMPREHENSIVE METABOLIC PANEL
ALBUMIN: 4 g/dL (ref 3.5–5.0)
ALT: 53 U/L (ref 17–63)
AST: 62 U/L — AB (ref 15–41)
Alkaline Phosphatase: 100 U/L (ref 38–126)
Anion gap: 9 (ref 5–15)
BILIRUBIN TOTAL: 0.6 mg/dL (ref 0.3–1.2)
BUN: 10 mg/dL (ref 6–20)
CHLORIDE: 102 mmol/L (ref 101–111)
CO2: 27 mmol/L (ref 22–32)
Calcium: 8.9 mg/dL (ref 8.9–10.3)
Creatinine, Ser: 0.79 mg/dL (ref 0.61–1.24)
GFR calc Af Amer: >60 mL/min (ref >60)
GFR calc non Af Amer: >60 mL/min (ref >60)
GLUCOSE: 140 mg/dL — AB (ref 65–99)
POTASSIUM: 4 mmol/L (ref 3.5–5.1)
Sodium: 138 mmol/L (ref 135–145)
Total Protein: 7 g/dL (ref 6.5–8.1)

## 2017-03-04 LAB — POCT RAPID STREP A: Streptococcus, Group A Screen (Direct): NEGATIVE

## 2017-03-04 MED ORDER — HYDROMORPHONE HCL 2 MG PO TABS
4.0000 mg | ORAL_TABLET | Freq: Once | ORAL | Status: AC
  Administered 2017-03-04: 4 mg via ORAL
  Filled 2017-03-04: qty 2

## 2017-03-04 MED ORDER — METHADONE HCL 10 MG PO TABS
10.0000 mg | ORAL_TABLET | Freq: Once | ORAL | Status: AC
  Administered 2017-03-04: 10 mg via ORAL
  Filled 2017-03-04: qty 1

## 2017-03-04 MED ORDER — PIPERACILLIN-TAZOBACTAM 3.375 G IVPB 30 MIN
3.3750 g | Freq: Once | INTRAVENOUS | Status: AC
  Administered 2017-03-04: 3.375 g via INTRAVENOUS

## 2017-03-04 MED ORDER — SODIUM CHLORIDE 0.9 % IV BOLUS (SEPSIS)
1000.0000 mL | Freq: Once | INTRAVENOUS | Status: AC
  Administered 2017-03-04: 1000 mL via INTRAVENOUS

## 2017-03-04 MED ORDER — VANCOMYCIN HCL IN DEXTROSE 1-5 GM/200ML-% IV SOLN
1000.0000 mg | Freq: Once | INTRAVENOUS | Status: AC
  Administered 2017-03-04: 1000 mg via INTRAVENOUS
  Filled 2017-03-04: qty 200

## 2017-03-04 NOTE — ED Notes (Signed)
Pt assisted to use urinal to void 

## 2017-03-04 NOTE — ED Notes (Signed)
Pt assisted to void in urinal 

## 2017-03-04 NOTE — ED Provider Notes (Addendum)
[facility] Emergency Department Provider Note  Time seen: 12:16 PM  I have reviewed the triage vital signs and the nursing notes.   HISTORY  Chief Complaint Fever; Sore Throat; and Shortness of Breath    HPI Alexander [name] is a 61 y.o. male with a past medical history of COPD, DVT, gastric reflux, Mantle cell lymphoma on chemotherapy, presents to the emergency department for fever. According to the patient has had a sore throat for the past 3 or 4 days, he went to his doctor today and was noted to have a fever 101.9 in the office and was sent to the ER for further evaluation. Patient states his last dose of chemotherapy was 3 weeks ago. He states mild shortness of breath although he states he is always short of breath. Denies any cough or congestion. Denies abdominal pain nausea vomiting or diarrhea.  Past Medical History:  Diagnosis Date  . Asthma   . Barrett's esophagus with high grade dysplasia   . BPH (benign prostatic hyperplasia)   . COPD (chronic obstructive pulmonary disease) ([place])   . Deviated nasal septum   . DVT (deep venous thrombosis) ([place])   . Erosive esophagitis   . GERD (gastroesophageal reflux disease)   . History of ischemic colitis   . Mantle cell lymphoma ([place])   . Migraine     Patient Active Problem List   Diagnosis Date Noted  . CAP (community acquired pneumonia) 07/05/2016  . Lymphoma ([place]) 07/05/2016  . Generalized weakness 07/05/2016  . Sepsis ([place]) 07/01/2016  . Acute encephalopathy 10/02/2015  . COPD (chronic obstructive pulmonary disease) ([place]) 10/02/2015  . GERD (gastroesophageal reflux disease) 10/02/2015  . BPH (benign prostatic hyperplasia) 10/02/2015  . History of DVT (deep vein thrombosis) 10/02/2015  . Orthostatic hypotension 09/28/2015  . Vasovagal syncope 09/28/2015  . Deep vein thrombosis (DVT) of distal vein of lower extremity ([place]) 09/28/2015  . Hypokalemia 09/25/2015  . Gastroenteritis, acute 09/25/2015   . Gastroenteritis 09/25/2015  . Pressure ulcer 08/18/2015  . HCAP (healthcare-associated pneumonia) 08/17/2015  . Syncope and collapse 08/10/2015  . Syncope 08/10/2015  . Protein-calorie malnutrition, severe 08/10/2015  . Abdominal pain 07/17/2015  . Nausea & vomiting 07/17/2015  . Malnutrition of moderate degree 07/17/2015  . Diarrhea 05/18/2015  . DEVIATED SEPTUM 05/18/2007  . ALLERGIC RHINITIS 05/18/2007  . ASTHMA 05/18/2007    Past Surgical History:  Procedure Laterality Date  . barryx N/A   . ESOPHAGOGASTRODUODENOSCOPY (EGD) WITH PROPOFOL N/A 02/23/2015   Procedure: ESOPHAGOGASTRODUODENOSCOPY (EGD) WITH PROPOFOL;  Surgeon: Hulen Luster, MD;  Location: [facility] ENDOSCOPY;  Service: Gastroenterology;  Laterality: N/A;  . ESOPHAGOGASTRODUODENOSCOPY (EGD) WITH PROPOFOL N/A 05/04/2015   Procedure: ESOPHAGOGASTRODUODENOSCOPY (EGD) WITH PROPOFOL;  Surgeon: Hulen Luster, MD;  Location: [facility] ENDOSCOPY;  Service: Gastroenterology;  Laterality: N/A;  . ESOPHAGOGASTRODUODENOSCOPY (EGD) WITH PROPOFOL N/A 07/27/2015   Procedure: ESOPHAGOGASTRODUODENOSCOPY (EGD) WITH PROPOFOL;  Surgeon: Hulen Luster, MD;  Location: [facility] ENDOSCOPY;  Service: Gastroenterology;  Laterality: N/A;  . ESOPHAGOGASTRODUODENOSCOPY (EGD) WITH PROPOFOL N/A 10/05/2015   Procedure: ESOPHAGOGASTRODUODENOSCOPY (EGD) WITH PROPOFOL;  Surgeon: Hulen Luster, MD;  Location: [facility] ENDOSCOPY;  Service: Gastroenterology;  Laterality: N/A;  . ESOPHAGOGASTRODUODENOSCOPY (EGD) WITH PROPOFOL N/A 01/04/2016   Procedure: ESOPHAGOGASTRODUODENOSCOPY (EGD) WITH PROPOFOL;  Surgeon: Hulen Luster, MD;  Location: [facility] ENDOSCOPY;  Service: Gastroenterology;  Laterality: N/A;  . ESOPHAGOGASTRODUODENOSCOPY (EGD) WITH PROPOFOL N/A 11/11/2016   Procedure: ESOPHAGOGASTRODUODENOSCOPY (EGD) WITH PROPOFOL;  Surgeon: Lollie Sails, MD;  Location: [facility] ENDOSCOPY;  Service: Endoscopy;  Laterality: N/A;  . NASAL SEPTUM SURGERY    . THROAT SURGERY    . TONSILLECTOMY    . UPPER  ESOPHAGEAL ENDOSCOPIC ULTRASOUND (EUS) N/A 10/19/2015   Procedure: UPPER ESOPHAGEAL ENDOSCOPIC ULTRASOUND (EUS);  Surgeon: Holly Bodily, MD;  Location[facility] ENDOSCOPY;  Service: Gastroenterology;  Laterality: N/A;    Prior to Admission medications   Medication Sig Start Date End Date Taking? Authorizing Provider  acetaminophen (TYLENOL) 500 MG tablet Take 1,000 mg by mouth every 8 (eight) hours as needed.    [provider]  albuterol (PROVENTIL HFA) 108 (90 Base) MCG/ACT inhaler Inhale 2 puffs into the lungs every 4 (four) hours as needed for wheezing or shortness of breath. 07/30/16   Carrie Mew, MD  budesonide-formoterol [facility]) 80-4.5 MCG/ACT inhaler Inhale 2 puffs into the lungs 2 (two) times daily.    [provider]  docusate sodium (COLACE) 100 MG capsule Take 100 mg by mouth 2 (two) times daily.    [provider]  enoxaparin (LOVENOX) 100 MG/ML injection Inject 100 mg into the skin every 12 (twelve) hours.    [provider]  feeding supplement (BOOST / RESOURCE BREEZE) LIQD Take 1 Container by mouth 3 (three) times daily between meals. Patient not taking: Reported on 11/11/2016 07/05/16   Theodoro Grist, MD  fluconazole (DIFLUCAN) 100 MG tablet Take 100 mg by mouth daily.    [provider]  fluticasone (FLONASE) 50 MCG/ACT nasal spray Place 2 sprays into both nostrils daily. 10/04/15   Nicholes Mango, MD  guaiFENesin (ROBITUSSIN) 100 MG/5ML SOLN Take 5 mLs (100 mg total) by mouth every 4 (four) hours as needed for cough or to loosen phlegm. 07/30/16   Carrie Mew, MD  HYDROmorphone (DILAUDID) 4 MG tablet Take by mouth every 4 (four) hours as needed for severe pain.    [provider]  ipratropium-albuterol (DUONEB) 0.5-2.5 (3) MG/3ML SOLN Take 3 mLs by nebulization every 4 (four) hours as needed. 07/05/16   Theodoro Grist, MD  loperamide (IMODIUM) 2 MG capsule Take 2 mg by mouth as needed for diarrhea or loose  stools.    [provider]  methadone (DOLOPHINE) 5 MG tablet Take 10 mg by mouth 2 (two) times daily as needed.    [provider]  mirtazapine (REMERON) 30 MG tablet Take 30 mg by mouth at bedtime.    [provider]  omeprazole (PRILOSEC) 40 MG capsule Take 40 mg by mouth daily.    [provider]  polyethylene glycol (MIRALAX / GLYCOLAX) packet Take 17 g by mouth daily as needed for mild constipation. 07/05/16   Theodoro Grist, MD  potassium chloride (K-DUR) 10 MEQ tablet Take 1 tablet (10 mEq total) by mouth 2 (two) times daily. Patient not taking: Reported on 12/01/2016 09/30/15   Fredia Sorrow, MD  potassium chloride SA (K-DUR,KLOR-CON) 20 MEQ tablet Take 20 mEq by mouth daily.     [provider]  promethazine (PHENERGAN) 25 MG tablet Take 1 tablet (25 mg total) by mouth every 6 (six) hours as needed for nausea or vomiting. 09/30/15   Fredia Sorrow, MD  senna-docusate (SENOKOT-S) 8.6-50 MG tablet Take 1 tablet by mouth 2 (two) times daily.    [provider]  sodium chloride (OCEAN) 0.65 % SOLN nasal spray Place 2 sprays into both nostrils as needed for congestion. 07/05/16   Theodoro Grist, MD  tamsulosin (FLOMAX) 0.4 MG CAPS capsule Take 0.4 mg by mouth daily.    [provider]    Allergies  Allergen Reactions  . Bee Venom Shortness Of Breath and Swelling  . Ativan [Lorazepam] Other (See Comments)    Reaction:  Dizziness     Family History  Problem Relation Age of Onset  . Mesothelioma Father   . Breast cancer Mother   . Diabetes Neg Hx   . Hypertension Neg Hx     Social History Social History  Substance Use Topics  . Smoking status: Former Smoker    Types: Cigarettes, Pipe, Cigars    Quit date: 10/20/1996  . Smokeless tobacco: Never Used  . Alcohol use No    Review of Systems Constitutional: Positive for sore throat. Positive for fever. Cardiovascular: Negative for chest pain. Respiratory: Negative  for shortness of breath. Gastrointestinal: Negative for abdominal pain Genitourinary: Negative for dysuria. Musculoskeletal: Negative for back pain Neurological: Negative for headache All other ROS negative  ____________________________________________   PHYSICAL EXAM:  VITAL SIGNS: ED Triage Vitals [03/04/17 1155]  Enc Vitals Group     BP (!) 150/76     Pulse Rate (!) 104     Resp 18     Temp 99.8 F (37.7 C)     Temp Source Oral     SpO2 96 %     Weight 218 lb (98.9 kg)     Height 5\' 10"  (1.778 m)     Head Circumference      Peak Flow      Pain Score 8     Pain Loc      Pain Edu?      Excl. in [facility]?     Constitutional: Alert and oriented. Well appearing and in no distress. Eyes: Normal exam ENT   Head: Normocephalic and atraumatic.   Mouth/Throat: Mucous membranes are moist. Moderate pharyngeal erythema with no obvious tonsillar hypertrophy or exudates. Patient is status post tonsillectomy. Cardiovascular: Normal rate, regular rhythm. No murmur Respiratory: Normal respiratory effort without tachypnea nor retractions. Breath sounds are clear. No wheezes rales or rhonchi. Gastrointestinal: Soft and nontender. No distention.   Musculoskeletal: Nontender with normal range of motion in all extremities. No lower extremity tenderness. Mild edema bilaterally. Neurologic:  Normal speech and language. No gross focal neurologic deficits Skin:  Skin is warm, dry and intact.  Psychiatric: Mood and affect are normal.   ____________________________________________   RADIOLOGY  Chest x-ray negative  ____________________________________________   INITIAL IMPRESSION / ASSESSMENT AND PLAN / ED COURSE  Pertinent labs & imaging results that were available during my care of the patient were reviewed by me and considered in my medical decision making (see chart for details).  Patient presents the emergency department with a fever on chemotherapy. We will check labs  including cultures, chest x-ray, urinalysis and a rapid strep swab given the sore throat. Patient does have moderate pharyngeal erythema without exudate.  The patient presents to the emergency department with a fever and pharyngitis. Patient's labs are largely within normal limits besides elevated lactate of 2.4. Patient is tachycardic room 110 bpm. Documented fever of 101.9 at the doctor's office. Given the elevated lactic acid with tachycardia and fever patient meets sepsis criteria we will cover with broad-spectrum antibiotics and admitted to the hospital until the patient cultures grow out. Patient is a [facility] patient and would prefer transfer to the [facility] if they have beds available. We will attempt transfer on the patient's behalf.  Patient has been accepted ED to ED to the [facility] for further treatment.  -----------------------------------------  7:50 PM on 03/04/2017 -----------------------------------------  We continue to wait for EMS transport to the [place]. Patient continues to appear well. Heart rate has declined into the 80s which is reassuring. We will dose the patient is evening pain medication.  ____________________________________________   FINAL CLINICAL IMPRESSION(S) / ED DIAGNOSES  Fever on chemotherapy Sepsis    Harvest Dark, MD 03/04/17 1658    Harvest Dark, MD 03/04/17 [phone]

## 2017-03-04 NOTE — ED Triage Notes (Signed)
Pt to ed with reports of mantle cell lymphoma and is currently being treated with chemo. Pt developed sore throat a couple of days ago and fever today at doctors office. Pt was sent to ER for furher eval.  NAD. Pt with port in place.

## 2017-03-04 NOTE — ED Notes (Signed)
2nd blood culture drawn by Gerald Stabs from the lab

## 2017-03-04 NOTE — ED Notes (Signed)
Reported lactic of 2.4 to Dr Kerman Passey - no new orders verbalized

## 2017-03-04 NOTE — ED Notes (Signed)
Pt reports fever for the last 2-3 days (no thermometer to test) - pt went to PCP today and had temp of 101.2 - c/o sore throat - pt is a chemo pt - PCP felt like pt needed to be seen and evaluated in the ed - pt also reports that he had a toenail infection and pimples "in odd places" that he would like evaluated

## 2017-03-04 NOTE — ED Notes (Signed)
[facility]  CALLED   TO TRANSPORT  PT  TO  [place]  [facility]

## 2017-03-04 NOTE — ED Notes (Signed)
Lab called to draw 2nd set of blood cultures because pt does not tolerate regular needle sticks for blood draws well - attempted by Opal Sidles RN without success

## 2017-03-04 NOTE — ED Notes (Signed)
PAPERWORK  FAXED  TO  [facility] [facility]  FOR  A  TRANSFER

## 2017-03-04 NOTE — ED Notes (Signed)
PT  GOING  TO  [facility]  ALL  PAPERWORK  AND  XRAY  SENT  WITH  [facility]

## 2017-03-05 LAB — URINE CULTURE: CULTURE: NO GROWTH

## 2017-03-07 LAB — CULTURE, GROUP A STREP (THRC)

## 2017-03-09 LAB — CULTURE, BLOOD (ROUTINE X 2)
CULTURE: NO GROWTH
CULTURE: NO GROWTH
Special Requests: ADEQUATE

## 2017-12-17 DEATH — deceased
